# Patient Record
Sex: Male | Born: 1940 | Race: White | Hispanic: No | Marital: Married | Smoking: Never smoker
Health system: Southern US, Community
[De-identification: ages and names within clinical notes are randomized; demographics above are authoritative.]

## PROBLEM LIST (undated history)

## (undated) ENCOUNTER — Emergency Department (HOSPITAL_COMMUNITY): Payer: 59

## (undated) DIAGNOSIS — C801 Malignant (primary) neoplasm, unspecified: Secondary | ICD-10-CM

## (undated) DIAGNOSIS — D689 Coagulation defect, unspecified: Secondary | ICD-10-CM

## (undated) DIAGNOSIS — H269 Unspecified cataract: Secondary | ICD-10-CM

## (undated) DIAGNOSIS — Z8619 Personal history of other infectious and parasitic diseases: Secondary | ICD-10-CM

## (undated) DIAGNOSIS — I10 Essential (primary) hypertension: Secondary | ICD-10-CM

## (undated) DIAGNOSIS — I4892 Unspecified atrial flutter: Secondary | ICD-10-CM

## (undated) DIAGNOSIS — Z953 Presence of xenogenic heart valve: Secondary | ICD-10-CM

## (undated) DIAGNOSIS — I499 Cardiac arrhythmia, unspecified: Secondary | ICD-10-CM

## (undated) DIAGNOSIS — I639 Cerebral infarction, unspecified: Secondary | ICD-10-CM

## (undated) DIAGNOSIS — Z5189 Encounter for other specified aftercare: Secondary | ICD-10-CM

## (undated) DIAGNOSIS — G709 Myoneural disorder, unspecified: Secondary | ICD-10-CM

## (undated) DIAGNOSIS — I4891 Unspecified atrial fibrillation: Secondary | ICD-10-CM

## (undated) DIAGNOSIS — N200 Calculus of kidney: Secondary | ICD-10-CM

## (undated) DIAGNOSIS — Z8673 Personal history of transient ischemic attack (TIA), and cerebral infarction without residual deficits: Secondary | ICD-10-CM

## (undated) DIAGNOSIS — E039 Hypothyroidism, unspecified: Secondary | ICD-10-CM

## (undated) DIAGNOSIS — Z87442 Personal history of urinary calculi: Secondary | ICD-10-CM

## (undated) DIAGNOSIS — M1990 Unspecified osteoarthritis, unspecified site: Secondary | ICD-10-CM

## (undated) DIAGNOSIS — J302 Other seasonal allergic rhinitis: Secondary | ICD-10-CM

## (undated) DIAGNOSIS — K219 Gastro-esophageal reflux disease without esophagitis: Secondary | ICD-10-CM

## (undated) DIAGNOSIS — Z85828 Personal history of other malignant neoplasm of skin: Secondary | ICD-10-CM

## (undated) HISTORY — PX: CARDIAC VALVE REPLACEMENT: SHX585

## (undated) HISTORY — PX: BILATERAL CARPAL TUNNEL RELEASE: SHX6508

## (undated) HISTORY — PX: BACK SURGERY: SHX140

## (undated) HISTORY — DX: Other seasonal allergic rhinitis: J30.2

## (undated) HISTORY — DX: Unspecified atrial flutter: I48.92

## (undated) HISTORY — DX: Personal history of other malignant neoplasm of skin: Z85.828

## (undated) HISTORY — DX: Personal history of other infectious and parasitic diseases: Z86.19

## (undated) HISTORY — PX: SPINE SURGERY: SHX786

## (undated) HISTORY — DX: Presence of xenogenic heart valve: Z95.3

## (undated) HISTORY — DX: Coagulation defect, unspecified: D68.9

## (undated) HISTORY — DX: Myoneural disorder, unspecified: G70.9

## (undated) HISTORY — DX: Encounter for other specified aftercare: Z51.89

## (undated) HISTORY — PX: JOINT REPLACEMENT: SHX530

## (undated) HISTORY — DX: Personal history of transient ischemic attack (TIA), and cerebral infarction without residual deficits: Z86.73

## (undated) HISTORY — DX: Calculus of kidney: N20.0

## (undated) HISTORY — DX: Unspecified cataract: H26.9

## (undated) HISTORY — PX: EPIGASTRIC HERNIA REPAIR: SUR1177

## (undated) HISTORY — DX: Unspecified atrial fibrillation: I48.91

## (undated) HISTORY — PX: HERNIA REPAIR: SHX51

## (undated) HISTORY — PX: COLONOSCOPY: SHX174

## (undated) HISTORY — PX: CATARACT EXTRACTION, BILATERAL: SHX1313

---

## 2007-07-13 HISTORY — PX: AORTIC VALVE REPLACEMENT: SHX41

## 2009-07-12 DIAGNOSIS — Z8673 Personal history of transient ischemic attack (TIA), and cerebral infarction without residual deficits: Secondary | ICD-10-CM

## 2009-07-12 HISTORY — DX: Personal history of transient ischemic attack (TIA), and cerebral infarction without residual deficits: Z86.73

## 2013-02-22 DIAGNOSIS — I10 Essential (primary) hypertension: Secondary | ICD-10-CM | POA: Insufficient documentation

## 2014-01-18 DIAGNOSIS — Z952 Presence of prosthetic heart valve: Secondary | ICD-10-CM | POA: Insufficient documentation

## 2015-09-29 ENCOUNTER — Emergency Department (INDEPENDENT_AMBULATORY_CARE_PROVIDER_SITE_OTHER)
Admission: EM | Admit: 2015-09-29 | Discharge: 2015-09-29 | Disposition: A | Discharge status: Home / Self Care | Payer: 59 | Source: Home / Self Care | Attending: Family Medicine | Admitting: Family Medicine

## 2015-09-29 ENCOUNTER — Encounter (HOSPITAL_COMMUNITY): Payer: Self-pay | Admitting: Emergency Medicine

## 2015-09-29 DIAGNOSIS — S90221A Contusion of right lesser toe(s) with damage to nail, initial encounter: Secondary | ICD-10-CM | POA: Diagnosis not present

## 2015-09-29 MED ORDER — CEPHALEXIN 500 MG PO CAPS: 500.0000 mg | ORAL_CAPSULE | Freq: Three times a day (TID) | ORAL | Status: DC

## 2015-09-29 NOTE — ED Provider Notes (Signed)
CSN: [id number]     Arrival date & time 09/29/15  1742 History   First MD Initiated Contact with Patient 09/29/15 1944     Chief Complaint  Patient presents with  . Nail Problem   (Consider location/radiation/quality/duration/timing/severity/associated sxs/prior Treatment) HPI History obtained from patient:   LOCATION:right great toe SEVERITY:2 DURATION:a couple of days CONTEXT:unsure of injury  QUALITY: MODIFYING FACTORS:soaking in warm water ASSOCIATED SYMPTOMS:some redness around base of nail TIMING:constant OCCUPATION:  Past Medical History  Diagnosis Date  . Aortic valve replaced    Past Surgical History  Procedure Laterality Date  . Back surgery     History reviewed. No pertinent family history. Social History  Substance Use Topics  . Smoking status: Never Smoker   . Smokeless tobacco: None  . Alcohol Use: No    Review of Systems Injury to toe Allergies  Review of patient's allergies indicates no known allergies.  Home Medications   Prior to Admission medications   Medication Sig Start Date End Date Taking? Authorizing Provider  dabigatran (PRADAXA) 150 MG CAPS capsule Take 150 mg by mouth 2 (two) times daily.   Yes Historical Provider, MD  Diclofenac Epolamine (FLECTOR TD) Place onto the skin.   Yes Historical Provider, MD  furosemide (LASIX) 20 MG tablet Take 20 mg by mouth daily as needed.   Yes Historical Provider, MD  Glucos-MSM-C-Mn-Ginger-Willow (GLUCOSAMINE MSM COMPLEX) TABS Take by mouth.   Yes Historical Provider, MD  levothyroxine (SYNTHROID) 50 MCG tablet Take 50 mcg by mouth daily before breakfast.   Yes Historical Provider, MD  potassium chloride (KLOR-CON) 20 MEQ packet Take by mouth 2 (two) times daily.   Yes Historical Provider, MD  tamsulosin (FLOMAX) 0.4 MG CAPS capsule Take 0.4 mg by mouth daily.   Yes Historical Provider, MD  cephALEXin (KEFLEX) 500 MG capsule Take 1 capsule (500 mg total) by mouth 3 (three) times daily. 09/29/15   Konrad Felix, PA   Meds Ordered and Administered this Visit  Medications - No data to display  BP 155/93 mmHg  Pulse 84  Temp(Src) 98.2 F (36.8 C) (Oral)  Resp 16  SpO2 97% No data found.   Physical Exam  Constitutional: He is oriented to person, place, and time. He appears well-developed and well-nourished. No distress.  HENT:  Head: Normocephalic and atraumatic.  Musculoskeletal:  Great toe right foot, subungual hematoma noted No infection, Nail remains attached to nail bed  Neurological: He is alert and oriented to person, place, and time.  Skin: Skin is warm and dry.  Psychiatric: He has a normal mood and affect. His behavior is normal.    ED Course  Procedures (including critical care time)  Labs Review Labs Reviewed - No data to display  Imaging Review No results found.   Visual Acuity Review  Right Eye Distance:   Left Eye Distance:   Bilateral Distance:    Right Eye Near:   Left Eye Near:    Bilateral Near:        Nail trephination with heat cautery and improvement of symptoms and pressure  RX for keflex is provided.  MDM   1. Subungual hematoma of foot, right, initial encounter     Patient is reassured that there are no issues that require transfer to higher level of care at this time or additional tests. Patient is advised to continue home symptomatic treatment. Patient is advised that if there are new or worsening symptoms to attend the emergency department, contact primary care provider,  or return to UC. Instructions of care provided discharged home in stable condition. Return to work/school note provided.   THIS NOTE WAS GENERATED USING A VOICE RECOGNITION SOFTWARE PROGRAM. ALL REASONABLE EFFORTS  WERE MADE TO PROOFREAD THIS DOCUMENT FOR ACCURACY.  I have verbally reviewed the discharge instructions with the patient. A printed AVS was given to the patient.  All questions were answered prior to discharge.      Konrad Felix,  [place] 09/29/15 2043

## 2015-09-29 NOTE — ED Notes (Signed)
Patient c/o right great toe discomfort and discoloration x 3 days. Has tried warm soaks. Patient is in NAD.

## 2015-09-29 NOTE — Discharge Instructions (Signed)
Subungual Hematoma  A subungual hematoma is a pocket of blood under the fingernail or toenail. The nail may turn blue or feel painful. HOME CARE  Put ice on the injured area.  Put ice in a plastic bag.  Place a towel between your skin and the bag.  Leave the ice on for 15-20 minutes, 03-04 times a day. Do this for the first 1 to 2 days.  Raise (elevate) the injured area to lessen pain and puffiness (swelling).  If you were given a bandage, wear it for as long as told by your doctor.  If part of your nail falls off, trim the rest of the nail gently.  Only take medicines as told by your doctor. GET HELP RIGHT AWAY IF:  You have redness or puffiness around the nail.  You have yellowish-white fluid (pus) coming from the nail.  Your pain does not get better with medicine.  You have a fever. MAKE SURE YOU:  Understand these instructions.  Will watch your condition.  Will get help right away if you are not doing well or get worse.   This information is not intended to replace advice given to you by your health care provider. Make sure you discuss any questions you have with your health care provider.   Document Released: 09/20/2011 Document Reviewed: 11/13/2014 Elsevier Interactive Patient Education [employer].

## 2015-10-01 MED FILL — CEPHALEXIN 500 MG CAPSULE: 500 | 7 days supply | Qty: 21 | Fill #0

## 2016-09-07 DIAGNOSIS — I480 Paroxysmal atrial fibrillation: Secondary | ICD-10-CM | POA: Insufficient documentation

## 2016-09-08 DIAGNOSIS — I480 Paroxysmal atrial fibrillation: Secondary | ICD-10-CM | POA: Diagnosis not present

## 2016-09-08 DIAGNOSIS — I4892 Unspecified atrial flutter: Secondary | ICD-10-CM | POA: Diagnosis not present

## 2016-09-08 DIAGNOSIS — I10 Essential (primary) hypertension: Secondary | ICD-10-CM | POA: Diagnosis not present

## 2016-09-08 DIAGNOSIS — Z952 Presence of prosthetic heart valve: Secondary | ICD-10-CM | POA: Diagnosis not present

## 2016-12-08 ENCOUNTER — Ambulatory Visit (INDEPENDENT_AMBULATORY_CARE_PROVIDER_SITE_OTHER): Payer: 59

## 2016-12-08 ENCOUNTER — Ambulatory Visit
Admission: EM | Admit: 2016-12-08 | Discharge: 2016-12-08 | Disposition: A | Discharge status: Home / Self Care | Payer: 59 | Attending: Emergency Medicine | Admitting: Emergency Medicine

## 2016-12-08 ENCOUNTER — Encounter: Payer: Self-pay | Admitting: Emergency Medicine

## 2016-12-08 DIAGNOSIS — J019 Acute sinusitis, unspecified: Secondary | ICD-10-CM | POA: Diagnosis not present

## 2016-12-08 DIAGNOSIS — R05 Cough: Secondary | ICD-10-CM | POA: Diagnosis not present

## 2016-12-08 DIAGNOSIS — R059 Cough, unspecified: Secondary | ICD-10-CM

## 2016-12-08 MED ORDER — DOXYCYCLINE HYCLATE 100 MG PO CAPS: 100.0000 mg | ORAL_CAPSULE | Freq: Two times a day (BID) | ORAL | 0 refills | Status: DC

## 2016-12-08 MED ORDER — ALBUTEROL SULFATE HFA 108 (90 BASE) MCG/ACT IN AERS: 2.0000 | INHALATION_SPRAY | RESPIRATORY_TRACT | 0 refills | Status: DC | PRN

## 2016-12-08 MED ORDER — BENZONATATE 100 MG PO CAPS: 100.0000 mg | ORAL_CAPSULE | Freq: Three times a day (TID) | ORAL | 0 refills | Status: DC | PRN

## 2016-12-08 NOTE — ED Triage Notes (Signed)
Patient c/o nasal congestion, cough, and chest congestion for 2-3 weeks.  Patient reports fever 3 days ago.

## 2016-12-08 NOTE — Discharge Instructions (Signed)
Take medication as prescribed. Rest. Drink plenty of fluids.  ° °Follow up with your primary care physician this week as needed. Return to Urgent care for new or worsening concerns.  ° °

## 2016-12-08 NOTE — ED Provider Notes (Signed)
[facility] ____________________________________________  Time seen: Approximately 9:45 AM  I have reviewed the triage vital signs and the nursing notes.   HISTORY  Chief Complaint Cough and Nasal Congestion  HPI Keith [name] is a 76 y.o. male present for evaluation of to 3 weeks of runny nose, nasal congestion and cough. Patient reports that he felt like he may have been having some seasonal allergies or picked up a cold but reports symptoms have continued. Reports occasionally symptoms slightly improved and then may return. Reports a few nights ago he did have a sensation of having a fever, not measured. Denies any fever since. Reports has taken some over-the-counter congestion medications with slight improvement, no resolution. Reports cough is productive of greenish mucus. Reports was getting a lot of greenish mucus out when blowing nose and postnasal drainage but states no longer as productive and nasal area. Denies pain. States some sore throat from coughing. Denies ear complaints. Reports occasional wheezing at night with coughing. Denies other wheezing. Denies shortness of breath. Reports continues to eat and drink well. Reports has continued to remain active. Denies known sick contacts, but reports he does travel a lot and frequently around other people.  Denies chest pain, shortness of breath, abdominal pain, dysuria, extremity pain, extremity swelling or rash. Denies recent sickness. Denies recent antibiotic use. Denies renal insufficiency.  PCP: in [place]   Past Medical History:  Diagnosis Date  . Aortic valve replaced   BPH  There are no active problems to display for this patient.   Past Surgical History:  Procedure Laterality Date  . BACK SURGERY       No current facility-administered medications for this encounter.   Current Outpatient Prescriptions:  .  albuterol (PROVENTIL HFA;VENTOLIN HFA) 108 (90 Base) MCG/ACT inhaler, Inhale 2 puffs into the  lungs every 4 (four) hours as needed for wheezing., Disp: 1 Inhaler, Rfl: 0 .  benzonatate (TESSALON PERLES) 100 MG capsule, Take 1 capsule (100 mg total) by mouth 3 (three) times daily as needed for cough., Disp: 15 capsule, Rfl: 0 .  dabigatran (PRADAXA) 150 MG CAPS capsule, Take 150 mg by mouth 2 (two) times daily., Disp: , Rfl:  .  Diclofenac Epolamine (FLECTOR TD), Place onto the skin., Disp: , Rfl:  .  doxycycline (VIBRAMYCIN) 100 MG capsule, Take 1 capsule (100 mg total) by mouth 2 (two) times daily., Disp: 20 capsule, Rfl: 0 .  furosemide (LASIX) 20 MG tablet, Take 20 mg by mouth daily as needed., Disp: , Rfl:  .  Glucos-MSM-C-Mn-Ginger-Willow (GLUCOSAMINE MSM COMPLEX) TABS, Take by mouth., Disp: , Rfl:  .  levothyroxine (SYNTHROID) 50 MCG tablet, Take 50 mcg by mouth daily before breakfast., Disp: , Rfl:  .  potassium chloride (KLOR-CON) 20 MEQ packet, Take by mouth 2 (two) times daily., Disp: , Rfl:  .  tamsulosin (FLOMAX) 0.4 MG CAPS capsule, Take 0.4 mg by mouth daily., Disp: , Rfl:   Allergies Patient has no known allergies.  History reviewed. No pertinent family history.  Social History Social History  Substance Use Topics  . Smoking status: Never Smoker  . Smokeless tobacco: Never Used  . Alcohol use No    Review of Systems Constitutional: No fever/chills ENT: As above. Cardiovascular: Denies chest pain. Respiratory: Denies shortness of breath. Gastrointestinal: No abdominal pain.  No nausea, no vomiting.  No diarrhea.  No constipation. Genitourinary: Negative for dysuria. Musculoskeletal: Negative for back pain. Skin: Negative for rash.  ____________________________________________   PHYSICAL EXAM:  VITAL SIGNS:  ED Triage Vitals  Enc Vitals Group     BP 12/08/16 0819 135/82     Pulse Rate 12/08/16 0819 73     Resp 12/08/16 0819 16     Temp 12/08/16 0819 97.9 F (36.6 C)     Temp Source 12/08/16 0819 Oral     SpO2 12/08/16 0819 97 %     Weight 12/08/16  0816 190 lb (86.2 kg)     Height 12/08/16 0816 5\' 11"  (1.803 m)     Head Circumference --      Peak Flow --      Pain Score 12/08/16 0817 0     Pain Loc --      Pain Edu? --      Excl. in [place]? --     Constitutional: Alert and oriented. Well appearing and in no acute distress. Eyes: Conjunctivae are normal. PERRL. EOMI. Head: Atraumatic.Mild  tenderness to palpation bilateral maxillary sinusesNo frontal sinus tenderness to palpation, . No swelling. No erythema.   Ears: no erythema, normal TMs bilaterally.   Nose:  mild nasal congestion with bilateral nasal turbinate erythema and edema.   Mouth/Throat: Mucous membranes are moist.  Oropharynx non-erythematous.No tonsillar swelling or exudate.  Neck: No stridor.  No cervical spine tenderness to palpation. Hematological/Lymphatic/Immunilogical: No cervical lymphadenopathy. Cardiovascular: Normal rate, regular rhythm. Grossly normal heart sounds.  Good peripheral circulation. Respiratory: Normal respiratory effort.  No retractions.  no wheezes. Mild scattered rhonchi.Good air movement.Occasional dry cough noted in room.   Gastrointestinal: Soft and nontender.  Musculoskeletal: No lower extremity tenderness nor edema No cervical, thoracic or lumbar tenderness to palpation.  Neurologic:  Normal speech and language.  No gait instability. Skin:  Skin is warm, dry Psychiatric: Mood and affect are normal. Speech and behavior are normal.  ___________________________________________   LABS (all labs ordered are listed, but only abnormal results are displayed)  Labs Reviewed - No data to display ____________________________________________  RADIOLOGY  Dg Chest 2 View  Result Date: 12/08/2016 CLINICAL DATA:  Chest congestion and productive cough. EXAM: CHEST  2 VIEW COMPARISON:  None. FINDINGS: Sternotomy wires overlie normal cardiac silhouette. No effusion, infiltrate or pneumothorax. Mild thickening along the RIGHT lateral pleural space.  Degenerative osteophytosis of the spine. Sclerosis in the LEFT humeral head is likely degenerative. IMPRESSION: No acute cardiopulmonary process. Electronically Signed   By: Suzy Bouchard M.D.   On: 12/08/2016 09:02   ____________________________________________   PROCEDURES Procedures   INITIAL IMPRESSION / ASSESSMENT AND PLAN / ED COURSE  Pertinent labs & imaging results that were available during my care of the patient were reviewed by me and considered in my medical decision making (see chart for details).  Well-appearing patient. No acute distress. Suspect sinusitis and bronchitis, discussed with patient we'll evaluate chest x-ray. Chest x-ray per radiologist no acute cardiopulmonary process. Will treat patient with doxycycline, when necessary Tessalon Perles and when necessary albuterol inhaler. Encouraged rest, fluids and supportive care. Discussed indication, risks and benefits of medications with patient.  Discussed follow up with Primary care physician this week. Discussed follow up and return parameters including no resolution or any worsening concerns. Patient verbalized understanding and agreed to plan.   ____________________________________________   FINAL CLINICAL IMPRESSION(S) / ED DIAGNOSES  Final diagnoses:  Acute sinusitis with symptoms greater than 10 days  Cough     Discharge Medication List as of 12/08/2016  9:15 AM    START taking these medications   Details  albuterol (PROVENTIL HFA;VENTOLIN HFA) 108 (90  Base) MCG/ACT inhaler Inhale 2 puffs into the lungs every 4 (four) hours as needed for wheezing., Starting Wed 12/08/2016, Normal    benzonatate (TESSALON PERLES) 100 MG capsule Take 1 capsule (100 mg total) by mouth 3 (three) times daily as needed for cough., Starting Wed 12/08/2016, Normal    doxycycline (VIBRAMYCIN) 100 MG capsule Take 1 capsule (100 mg total) by mouth 2 (two) times daily., Starting Wed 12/08/2016, Normal        Note: This dictation  was prepared with Dragon dictation along with smaller phrase technology. Any transcriptional errors that result from this process are unintentional.         Marylene Land, NP 12/08/16 [phone]

## 2016-12-21 DIAGNOSIS — M5416 Radiculopathy, lumbar region: Secondary | ICD-10-CM | POA: Diagnosis not present

## 2016-12-21 MED FILL — predniSONE 10 MG (21) TBPK: 10 | 6 days supply | Qty: 21 | Fill #0

## 2017-01-03 DIAGNOSIS — M5416 Radiculopathy, lumbar region: Secondary | ICD-10-CM | POA: Diagnosis not present

## 2017-01-07 DIAGNOSIS — M5416 Radiculopathy, lumbar region: Secondary | ICD-10-CM | POA: Diagnosis not present

## 2017-01-11 DIAGNOSIS — M5416 Radiculopathy, lumbar region: Secondary | ICD-10-CM | POA: Diagnosis not present

## 2017-01-21 DIAGNOSIS — M5416 Radiculopathy, lumbar region: Secondary | ICD-10-CM | POA: Diagnosis not present

## 2017-02-05 DIAGNOSIS — M5416 Radiculopathy, lumbar region: Secondary | ICD-10-CM | POA: Diagnosis not present

## 2017-02-15 DIAGNOSIS — M961 Postlaminectomy syndrome, not elsewhere classified: Secondary | ICD-10-CM | POA: Diagnosis not present

## 2017-02-15 DIAGNOSIS — M5136 Other intervertebral disc degeneration, lumbar region: Secondary | ICD-10-CM | POA: Diagnosis not present

## 2017-02-15 DIAGNOSIS — M5416 Radiculopathy, lumbar region: Secondary | ICD-10-CM | POA: Diagnosis not present

## 2017-03-16 DIAGNOSIS — M5416 Radiculopathy, lumbar region: Secondary | ICD-10-CM | POA: Diagnosis not present

## 2017-03-16 DIAGNOSIS — M961 Postlaminectomy syndrome, not elsewhere classified: Secondary | ICD-10-CM | POA: Diagnosis not present

## 2017-04-04 DIAGNOSIS — M5416 Radiculopathy, lumbar region: Secondary | ICD-10-CM | POA: Diagnosis not present

## 2017-04-04 DIAGNOSIS — M5136 Other intervertebral disc degeneration, lumbar region: Secondary | ICD-10-CM | POA: Diagnosis not present

## 2017-04-04 DIAGNOSIS — M961 Postlaminectomy syndrome, not elsewhere classified: Secondary | ICD-10-CM | POA: Diagnosis not present

## 2017-04-19 DIAGNOSIS — M961 Postlaminectomy syndrome, not elsewhere classified: Secondary | ICD-10-CM | POA: Diagnosis not present

## 2017-05-09 ENCOUNTER — Other Ambulatory Visit: Payer: Self-pay | Admitting: Neurological Surgery

## 2017-05-09 DIAGNOSIS — M5416 Radiculopathy, lumbar region: Secondary | ICD-10-CM | POA: Diagnosis not present

## 2017-05-23 NOTE — Progress Notes (Signed)
Called Dr. Ronnald Ramp office to inquire if they have any info from patients cardio in [place] (left VM for Erin [phone])

## 2017-05-23 NOTE — Pre-Procedure Instructions (Signed)
Keith [name]  05/23/2017      [facility] - [place], [place] [facility] [address] AT [address] [place] [address] Phone: [phone] Fax: [phone]  [place], [place] - [address] [place] [address] Phone: [phone] Fax: [phone]    Your procedure is scheduled on Wednesday, November 21st   Report to [facility] Admitting at 8:30 AM             (posted surgery time 10:30a - 12noon)   Call this number if you have problems the MORNING  of surgery:  [phone].   Remember:              4-5 days prior to surgery, STOP TAKING any Vitamins, Herbal Supplements, Anti-inflammatories, Blood thinners.              Pradaxa will be stopped on___________________________   Do not eat food or drink liquids after midnight Tuesday.   Take these medicines the morning of surgery with A SIP OF WATER : Levothyroxine, Tamsulosin.    Do not wear jewelry, no rings or watches.  Do not wear lotions, colognes or deoderant.             Men may shave face and neck.   Do not bring valuables to the hospital.  [facility] is not responsible for any belongings or valuables.  Contacts, dentures or bridgework may not be worn into surgery.  Leave your suitcase in the car.  After surgery it may be brought to your room. For patients admitted to the hospital, discharge time will be determined by your treatment team.   Please read over the following fact sheets that you were given. Pain Booklet, MRSA Information and Surgical Site Infection Prevention       [place]- Preparing For Surgery  Before surgery, you can play an important role. Because skin is not sterile, your skin needs to be as free of germs as possible. You can reduce the number of germs on your skin by washing with CHG (chlorahexidine gluconate) Soap before surgery.  CHG is an antiseptic cleaner which  kills germs and bonds with the skin to continue killing germs even after washing.  Please do not use if you have an allergy to CHG or antibacterial soaps. If your skin becomes reddened/irritated stop using the CHG.  Do not shave (including legs and underarms) for at least 48 hours prior to first CHG shower. It is OK to shave your face.  Please follow these instructions carefully.   1. Shower the NIGHT BEFORE SURGERY and the MORNING OF SURGERY with CHG.   2. If you chose to wash your hair, wash your hair first as usual with your normal shampoo.  3. After you shampoo, rinse your hair and body thoroughly to remove the shampoo.  4. Use CHG as you would any other liquid soap. You can apply CHG directly to the skin and wash gently with a scrungie or a clean washcloth.   5. Apply the CHG Soap to your body ONLY FROM THE NECK DOWN.  Do not use on open wounds or open sores. Avoid contact with your eyes, ears, mouth and genitals (private parts). Wash Face and genitals (private parts)  with your normal soap.  6. Wash thoroughly, paying special attention to the area where your surgery will be performed.  7. Thoroughly rinse  your body with warm water from the neck down.  8. DO NOT shower/wash with your normal soap after using and rinsing off the CHG Soap.  9. Pat yourself dry with a CLEAN TOWEL.  10. Wear CLEAN PAJAMAS to bed the night before surgery, wear comfortable clothes the morning of surgery  11. Place CLEAN SHEETS on your bed the night of your first shower and DO NOT SLEEP WITH PETS.    Day of Surgery: Do not apply any deodorants/lotions. Please wear clean clothes to the hospital/surgery center.

## 2017-05-24 ENCOUNTER — Other Ambulatory Visit: Payer: Self-pay

## 2017-05-24 ENCOUNTER — Encounter (HOSPITAL_COMMUNITY)
Admission: RE | Admit: 2017-05-24 | Discharge: 2017-05-24 | Disposition: A | Discharge status: Home / Self Care | Payer: 59 | Source: Ambulatory Visit | Attending: Neurological Surgery | Admitting: Neurological Surgery

## 2017-05-24 ENCOUNTER — Encounter (HOSPITAL_COMMUNITY): Payer: Self-pay

## 2017-05-24 DIAGNOSIS — E039 Hypothyroidism, unspecified: Secondary | ICD-10-CM | POA: Insufficient documentation

## 2017-05-24 DIAGNOSIS — I481 Persistent atrial fibrillation: Secondary | ICD-10-CM | POA: Diagnosis not present

## 2017-05-24 DIAGNOSIS — Z01812 Encounter for preprocedural laboratory examination: Secondary | ICD-10-CM | POA: Diagnosis not present

## 2017-05-24 DIAGNOSIS — K219 Gastro-esophageal reflux disease without esophagitis: Secondary | ICD-10-CM | POA: Diagnosis not present

## 2017-05-24 DIAGNOSIS — M1990 Unspecified osteoarthritis, unspecified site: Secondary | ICD-10-CM | POA: Diagnosis not present

## 2017-05-24 DIAGNOSIS — Z01810 Encounter for preprocedural cardiovascular examination: Secondary | ICD-10-CM | POA: Insufficient documentation

## 2017-05-24 HISTORY — DX: Gastro-esophageal reflux disease without esophagitis: K21.9

## 2017-05-24 HISTORY — DX: Personal history of urinary calculi: Z87.442

## 2017-05-24 HISTORY — DX: Cerebral infarction, unspecified: I63.9

## 2017-05-24 HISTORY — DX: Malignant (primary) neoplasm, unspecified: C80.1

## 2017-05-24 HISTORY — DX: Cardiac arrhythmia, unspecified: I49.9

## 2017-05-24 HISTORY — DX: Unspecified osteoarthritis, unspecified site: M19.90

## 2017-05-24 LAB — CBC WITH DIFFERENTIAL/PLATELET
BASOS ABS: 0 10*3/uL (ref 0.0–0.1)
BASOS PCT: 0 %
Eosinophils Absolute: 0.3 10*3/uL (ref 0.0–0.7)
Eosinophils Relative: 4 %
HEMATOCRIT: 43.2 % (ref 39.0–52.0)
Hemoglobin: 14.6 g/dL (ref 13.0–17.0)
Lymphocytes Relative: 19 %
Lymphs Abs: 1.2 10*3/uL (ref 0.7–4.0)
MCH: 30.9 pg (ref 26.0–34.0)
MCHC: 33.8 g/dL (ref 30.0–36.0)
MCV: 91.5 fL (ref 78.0–100.0)
Monocytes Absolute: 0.6 10*3/uL (ref 0.1–1.0)
Monocytes Relative: 9 %
NEUTROS ABS: 4.5 10*3/uL (ref 1.7–7.7)
NEUTROS PCT: 68 %
Platelets: 166 10*3/uL (ref 150–400)
RBC: 4.72 MIL/uL (ref 4.22–5.81)
RDW: 12.9 % (ref 11.5–15.5)
WBC: 6.6 10*3/uL (ref 4.0–10.5)

## 2017-05-24 LAB — BASIC METABOLIC PANEL
ANION GAP: 6 (ref 5–15)
BUN: 23 mg/dL — ABNORMAL HIGH (ref 6–20)
CO2: 24 mmol/L (ref 22–32)
Calcium: 8.6 mg/dL — ABNORMAL LOW (ref 8.9–10.3)
Chloride: 108 mmol/L (ref 101–111)
Creatinine, Ser: 1.09 mg/dL (ref 0.61–1.24)
GLUCOSE: 97 mg/dL (ref 65–99)
Potassium: 4 mmol/L (ref 3.5–5.1)
Sodium: 138 mmol/L (ref 135–145)

## 2017-05-24 LAB — SURGICAL PCR SCREEN
MRSA, PCR: NEGATIVE
STAPHYLOCOCCUS AUREUS: NEGATIVE

## 2017-05-24 NOTE — Progress Notes (Signed)
PATIENT STATED HE WILL TAKE LAST DOSE OF PRADAXA 05/26/17.  WILL NEED PT/ INR DOS.  REQUESTED FROM DR. [place] TEST, CARDIAC CATH, EKG, LAST OV.  [phone] OR [phone]

## 2017-05-25 NOTE — Progress Notes (Addendum)
Anesthesia Chart Review: Patient is a 76 year old male scheduled for extraforaminal microdiscectomy L5-S1, right on 06/01/2017 by Dr. Sherley Bounds.  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  Patient does not have a PCP. His cardiologist is Dr. Cline Cools with [facility] (phone [phone], fax [phone]; see Care Everywhere). Last visit 09/07/16. Of note, patient has known PAF. Back on 06/23/15 Dr. Lorne Skeens discussed afib management, rate versus rhythm control and decision was made for rate control; however, shortly thereafter patient developed symptoms from his afib and underwent DCCV 06/26/15. Patient says symptoms were present when his HR ~ 90-100, but otherwise patient is not symptomatic with his usual HR ~ 60's and denied any current symptoms such as SOB, fatigue, chest pain, edema, palpitations. 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 He has follow-up with Dr. Lorne Skeens scheduled for next year.)  (Update 05/30/17 9:33 AM: Received cardiac clearance letter from signed by Dr. Lorne Skeens. He gave permission to hold Pradaxa 3 days prior to surgery, although patient has actually been holding since 05/26/17.)  BP (!) 152/84   Pulse 71   Temp 36.5 C   Resp 20   Ht 5\' 11"  (1.803 m)   Wt 191 lb 8 oz (86.9 kg)   SpO2 97%   BMI 26.71 kg/m   Meds include Pradaxa (last dose 05/26/17), flecainide, Lasix, levothyroxine, KCl, Flomax. (Atenolol discontinued in the past due to fatigue and bradycardia.)  EKG 05/24/17 10:42:52: Afib at 66 bpm, non-specific T wave abnormality.   EKG 05/24/17 10:51:38: Afib at 66 bpm, non-specific T wave abnormality. (According to Dr. Randye Lobo 09/07/16 note, EKG 06/23/15 showed rate controlled afib. Patient  underwent DCCV 06/26/15. 06/30/15, 01/07/16, 09/07/16 showed SR/SB with first degree AV block.)   TEE (with DCCV) 06/26/2015 ([facility]; Care Everywhere): 1. No intracardiac thrombus visualized. 2. Moderate left atrial enlargement. 3. Normal left ventricular size and normal systolic function. Estimated EF 65%. 4. Trivial mitral insufficiency, trivial tricuspid insufficiency, and normal functioning bioprosthetic aortic valve. Peak velocity through the aortic valve is 1.30m/sec. 5. Normal size aortic root. 6. Bubble study showing a small patent foramen ovale (small right to left atrial shunt).  Latest cardiac cath requested from Dr. Randye Lobo office. Cath report received was from 01/18/08 and showed: Normal-appearing coronary arteries with no evidence of any significant atherosclerotic coronary disease by angiography. 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 Normal left ventricular systolic function. (Patinet subsequently underwent AVR 02/27/08.)  CXR 12/08/16: IMPRESSION: No acute cardiopulmonary process.  Preoperative labs noted. Cr 1.09, glucose 97, CBC WNL. PT/INR scheduled for the day of surgery.   Patient with known PAF with recurrent afib on PAT EKG. He is rate controlled. Patient notified of recurrent afib, and he denied symptoms. His last dose of Pradaxa is scheduled for today. Normal coronaries in 2009 prior to AVR. I discussed above with anesthesiologist Dr. Annye Asa. If patient remains rate controlled and without CV/CHF symptoms or other acute changes then it is anticipated that he can proceed as planned. I did notify patient that if he develops fast heart rate, new chest pain, SOB, fatigue, edema that he would need to be be re-evaluated by cardiology prior to surgery, but if no new changes before surgery then would plan to proceed. I have updated Lorriane Shire at Dr. Ronnald Ramp' office. Anesthesiologist  to evaluate on the day of surgery.     George Hugh [facility] Short Stay  Center/Anesthesiology Phone [phone] 05/26/2017 11:53 AM

## 2017-05-31 ENCOUNTER — Other Ambulatory Visit: Payer: Self-pay | Admitting: *Deleted

## 2017-05-31 NOTE — Patient Outreach (Signed)
Keith [name] [facility]) Care Management  05/31/2017  Keith [name] Sep 05, 1940 [id number]   Subjective: Telephone call to patient's home / mobile number, spoke with patient, and HIPAA verified.  Discussed [facility] Care Management [facility]MR Transition of care follow up, preoperative call follow up, patient voiced understanding, and is in agreement to both types of follow up.   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 States she is accessing the following [facility] benefits: outpatient pharmacy, hospital indemnity (will ask his wife who is the [employer] employee to follow up, file claim if appropriate), and will call [employer] today to start family medical leave act (FMLA) process.  Patient states he does not have any preoperative questions, care coordination, disease management, disease monitoring, transportation, community resource, or pharmacy needs at this time.   States he is very appreciative of the follow up and is in agreement to receive [place] Management information post transition of care follow up.      Objective: Per KPN (Knowledge Performance Now, point of care tool) and chart review, patient to be admitted on 06/01/17 to [facility] for EXTRAFORAMINAL MICRODISCECTOMY LUMBAR 5- SACRAL 1, RIGHT.    Patient has a history of BPH.       Assessment: Received UMR Transition of care referral on 05/25/17.   Preoperative call completed, and transition of care follow up pending notification of patient discharge.     Plan: RNCM will call patient for  telephone outreach attempt, transition of care follow up, within 3 business days of hospital discharge notification.    Keith [name] H. Annia Friendly, BSN, [place] Management [facility]  Telephonic CM Phone: [phone] Fax: [phone]

## 2017-05-31 NOTE — Anesthesia Preprocedure Evaluation (Addendum)
Anesthesia Evaluation  Patient identified by MRN, date of birth, ID band Patient awake    Reviewed: Allergy & Precautions, NPO status , Patient's Chart, lab work & pertinent test results  Airway Mallampati: I  TM Distance: >3 FB Neck ROM: Full    Dental  (+) Dental Advisory Given, Teeth Intact   Pulmonary neg pulmonary ROS,    Pulmonary exam normal breath sounds clear to auscultation       Cardiovascular Exercise Tolerance: Good Normal cardiovascular exam+ dysrhythmias Atrial Fibrillation  Rhythm:Irregular Rate:Normal  S/p AVR  TEE 2016 -  Moderate left atrial enlargement. Normal left ventricular size and normal systolic function. Trivial mitral insufficiency, trivial tricuspid insufficiency, and normal functioning bioprosthetic aortic valve. Normal size aortic root. Small PFO.   Neuro/Psych CVA, Residual Symptoms negative psych ROS   GI/Hepatic Neg liver ROS, GERD  Controlled and Medicated,  Endo/Other  Hypothyroidism   Renal/GU negative Renal ROS  negative genitourinary   Musculoskeletal  (+) Arthritis ,   Abdominal   Peds  Hematology negative hematology ROS (+)   Anesthesia Other Findings   Reproductive/Obstetrics                            Anesthesia Physical Anesthesia Plan  ASA: III  Anesthesia Plan: General   Post-op Pain Management:    Induction: Intravenous  PONV Risk Score and Plan: 3 and Treatment may vary due to age or medical condition, Dexamethasone and Ondansetron  Airway Management Planned: Oral ETT  Additional Equipment: None  Intra-op Plan:   Post-operative Plan: Extubation in OR  Informed Consent: I have reviewed the patients History and Physical, chart, labs and discussed the procedure including the risks, benefits and alternatives for the proposed anesthesia with the patient or authorized representative who has indicated his/her understanding and  acceptance.   Dental advisory given  Plan Discussed with: CRNA  Anesthesia Plan Comments:         Anesthesia Quick Evaluation

## 2017-06-01 ENCOUNTER — Ambulatory Visit (HOSPITAL_COMMUNITY): Payer: 59 | Admitting: Vascular Surgery

## 2017-06-01 ENCOUNTER — Other Ambulatory Visit: Payer: Self-pay

## 2017-06-01 ENCOUNTER — Observation Stay (HOSPITAL_COMMUNITY): Payer: 59

## 2017-06-01 ENCOUNTER — Ambulatory Visit (HOSPITAL_COMMUNITY): Payer: 59

## 2017-06-01 ENCOUNTER — Encounter (HOSPITAL_COMMUNITY)
Admission: RE | Disposition: A | Discharge status: Home / Self Care | Payer: Self-pay | Source: Ambulatory Visit | Attending: Neurological Surgery

## 2017-06-01 ENCOUNTER — Encounter (HOSPITAL_COMMUNITY): Payer: Self-pay | Admitting: Anesthesiology

## 2017-06-01 ENCOUNTER — Observation Stay (HOSPITAL_COMMUNITY)
Admission: RE | Admit: 2017-06-01 | Discharge: 2017-06-01 | Disposition: A | Discharge status: Home / Self Care | Payer: 59 | Source: Ambulatory Visit | Attending: Neurological Surgery | Admitting: Neurological Surgery

## 2017-06-01 ENCOUNTER — Ambulatory Visit (HOSPITAL_COMMUNITY): Payer: 59 | Admitting: Anesthesiology

## 2017-06-01 DIAGNOSIS — Z952 Presence of prosthetic heart valve: Secondary | ICD-10-CM | POA: Diagnosis not present

## 2017-06-01 DIAGNOSIS — Z791 Long term (current) use of non-steroidal anti-inflammatories (NSAID): Secondary | ICD-10-CM | POA: Diagnosis not present

## 2017-06-01 DIAGNOSIS — Z79899 Other long term (current) drug therapy: Secondary | ICD-10-CM | POA: Insufficient documentation

## 2017-06-01 DIAGNOSIS — M1990 Unspecified osteoarthritis, unspecified site: Secondary | ICD-10-CM | POA: Insufficient documentation

## 2017-06-01 DIAGNOSIS — M48061 Spinal stenosis, lumbar region without neurogenic claudication: Secondary | ICD-10-CM | POA: Diagnosis not present

## 2017-06-01 DIAGNOSIS — I4891 Unspecified atrial fibrillation: Secondary | ICD-10-CM | POA: Diagnosis not present

## 2017-06-01 DIAGNOSIS — M5417 Radiculopathy, lumbosacral region: Secondary | ICD-10-CM | POA: Diagnosis not present

## 2017-06-01 DIAGNOSIS — Z9889 Other specified postprocedural states: Secondary | ICD-10-CM

## 2017-06-01 DIAGNOSIS — K219 Gastro-esophageal reflux disease without esophagitis: Secondary | ICD-10-CM | POA: Insufficient documentation

## 2017-06-01 DIAGNOSIS — M5117 Intervertebral disc disorders with radiculopathy, lumbosacral region: Secondary | ICD-10-CM | POA: Diagnosis not present

## 2017-06-01 DIAGNOSIS — Z8673 Personal history of transient ischemic attack (TIA), and cerebral infarction without residual deficits: Secondary | ICD-10-CM | POA: Insufficient documentation

## 2017-06-01 DIAGNOSIS — E039 Hypothyroidism, unspecified: Secondary | ICD-10-CM | POA: Diagnosis not present

## 2017-06-01 DIAGNOSIS — M5116 Intervertebral disc disorders with radiculopathy, lumbar region: Secondary | ICD-10-CM | POA: Diagnosis not present

## 2017-06-01 DIAGNOSIS — Z419 Encounter for procedure for purposes other than remedying health state, unspecified: Secondary | ICD-10-CM

## 2017-06-01 HISTORY — PX: LUMBAR LAMINECTOMY/DECOMPRESSION MICRODISCECTOMY: SHX5026

## 2017-06-01 LAB — PROTIME-INR
INR: 1.07
Prothrombin Time: 13.8 seconds (ref 11.4–15.2)

## 2017-06-01 SURGERY — LUMBAR LAMINECTOMY/DECOMPRESSION MICRODISCECTOMY 1 LEVEL
Anesthesia: General | Site: Back | Laterality: Right

## 2017-06-01 MED ORDER — METHOCARBAMOL 500 MG PO TABS
ORAL_TABLET | ORAL | Status: AC
  Filled 2017-06-01: qty 1

## 2017-06-01 MED ORDER — DABIGATRAN ETEXILATE MESYLATE 150 MG PO CAPS: 150.0000 mg | ORAL_CAPSULE | Freq: Two times a day (BID) | ORAL | Status: DC

## 2017-06-01 MED ORDER — HYDROCODONE-ACETAMINOPHEN 7.5-325 MG PO TABS
1.0000 | ORAL_TABLET | Freq: Four times a day (QID) | ORAL | Status: DC
  Administered 2017-06-01: 1 via ORAL
  Filled 2017-06-01 (×2): qty 1

## 2017-06-01 MED ORDER — ONDANSETRON HCL 4 MG/2ML IJ SOLN: 4.0000 mg | Freq: Once | INTRAMUSCULAR | Status: DC | PRN

## 2017-06-01 MED ORDER — ACETAMINOPHEN 325 MG PO TABS: 650.0000 mg | ORAL_TABLET | ORAL | Status: DC | PRN

## 2017-06-01 MED ORDER — METHYLPREDNISOLONE ACETATE 80 MG/ML IJ SUSP
INTRAMUSCULAR | Status: AC
  Filled 2017-06-01: qty 1

## 2017-06-01 MED ORDER — CEFAZOLIN SODIUM-DEXTROSE 2-4 GM/100ML-% IV SOLN
2.0000 g | INTRAVENOUS | Status: AC
  Administered 2017-06-01: 2 g via INTRAVENOUS
  Filled 2017-06-01: qty 100

## 2017-06-01 MED ORDER — BUPIVACAINE HCL (PF) 0.25 % IJ SOLN
INTRAMUSCULAR | Status: DC | PRN
  Administered 2017-06-01: 2 mL

## 2017-06-01 MED ORDER — PROPOFOL 10 MG/ML IV BOLUS
INTRAVENOUS | Status: DC | PRN
  Administered 2017-06-01: 150 mg via INTRAVENOUS

## 2017-06-01 MED ORDER — LIDOCAINE HCL (CARDIAC) 20 MG/ML IV SOLN
INTRAVENOUS | Status: DC | PRN
  Administered 2017-06-01: 80 mg via INTRAVENOUS

## 2017-06-01 MED ORDER — SODIUM CHLORIDE 0.9 % IR SOLN
Status: DC | PRN
  Administered 2017-06-01: 500 mL

## 2017-06-01 MED ORDER — PROPOFOL 10 MG/ML IV BOLUS
INTRAVENOUS | Status: AC
  Filled 2017-06-01: qty 20

## 2017-06-01 MED ORDER — ROCURONIUM BROMIDE 100 MG/10ML IV SOLN
INTRAVENOUS | Status: DC | PRN
  Administered 2017-06-01: 50 mg via INTRAVENOUS

## 2017-06-01 MED ORDER — CEFAZOLIN SODIUM-DEXTROSE 2-4 GM/100ML-% IV SOLN: 2.0000 g | Freq: Three times a day (TID) | INTRAVENOUS | Status: DC

## 2017-06-01 MED ORDER — SUGAMMADEX SODIUM 200 MG/2ML IV SOLN
INTRAVENOUS | Status: AC
  Filled 2017-06-01: qty 2

## 2017-06-01 MED ORDER — PHENYLEPHRINE HCL 10 MG/ML IJ SOLN
INTRAMUSCULAR | Status: DC | PRN
  Administered 2017-06-01: 80 ug via INTRAVENOUS

## 2017-06-01 MED ORDER — FLECAINIDE ACETATE 50 MG PO TABS
50.0000 mg | ORAL_TABLET | Freq: Two times a day (BID) | ORAL | Status: DC
  Filled 2017-06-01: qty 1

## 2017-06-01 MED ORDER — DEXAMETHASONE SODIUM PHOSPHATE 10 MG/ML IJ SOLN
INTRAMUSCULAR | Status: AC
  Filled 2017-06-01: qty 1

## 2017-06-01 MED ORDER — SODIUM CHLORIDE 0.9 % IV SOLN: 250.0000 mL | INTRAVENOUS | Status: DC

## 2017-06-01 MED ORDER — SODIUM CHLORIDE 0.9% FLUSH: 3.0000 mL | Freq: Two times a day (BID) | INTRAVENOUS | Status: DC

## 2017-06-01 MED ORDER — ROCURONIUM BROMIDE 10 MG/ML (PF) SYRINGE
PREFILLED_SYRINGE | INTRAVENOUS | Status: AC
  Filled 2017-06-01: qty 5

## 2017-06-01 MED ORDER — THROMBIN (RECOMBINANT) 5000 UNITS EX SOLR
OROMUCOSAL | Status: DC | PRN
  Administered 2017-06-01: 5 mL via TOPICAL

## 2017-06-01 MED ORDER — BUPIVACAINE HCL (PF) 0.25 % IJ SOLN
INTRAMUSCULAR | Status: AC
  Filled 2017-06-01: qty 30

## 2017-06-01 MED ORDER — POTASSIUM CHLORIDE IN NACL 20-0.9 MEQ/L-% IV SOLN: INTRAVENOUS | Status: DC

## 2017-06-01 MED ORDER — LACTATED RINGERS IV SOLN
INTRAVENOUS | Status: DC
  Administered 2017-06-01: 10:00:00 via INTRAVENOUS

## 2017-06-01 MED ORDER — LIDOCAINE 2% (20 MG/ML) 5 ML SYRINGE
INTRAMUSCULAR | Status: AC
  Filled 2017-06-01: qty 5

## 2017-06-01 MED ORDER — ACETAMINOPHEN 650 MG RE SUPP: 650.0000 mg | RECTAL | Status: DC | PRN

## 2017-06-01 MED ORDER — PHENOL 1.4 % MT LIQD: 1.0000 | OROMUCOSAL | Status: DC | PRN

## 2017-06-01 MED ORDER — CHLORHEXIDINE GLUCONATE CLOTH 2 % EX PADS: 6.0000 | MEDICATED_PAD | Freq: Once | CUTANEOUS | Status: DC

## 2017-06-01 MED ORDER — SENNA 8.6 MG PO TABS
1.0000 | ORAL_TABLET | Freq: Two times a day (BID) | ORAL | Status: DC
  Administered 2017-06-01: 8.6 mg via ORAL
  Filled 2017-06-01: qty 1

## 2017-06-01 MED ORDER — DEXAMETHASONE SODIUM PHOSPHATE 10 MG/ML IJ SOLN: 10.0000 mg | INTRAMUSCULAR | Status: DC

## 2017-06-01 MED ORDER — HYDROCODONE-ACETAMINOPHEN 7.5-325 MG PO TABS: 1.0000 | ORAL_TABLET | Freq: Four times a day (QID) | ORAL | 0 refills | Status: DC

## 2017-06-01 MED ORDER — MENTHOL 3 MG MT LOZG: 1.0000 | LOZENGE | OROMUCOSAL | Status: DC | PRN

## 2017-06-01 MED ORDER — ONDANSETRON HCL 4 MG PO TABS: 4.0000 mg | ORAL_TABLET | Freq: Four times a day (QID) | ORAL | Status: DC | PRN

## 2017-06-01 MED ORDER — SUGAMMADEX SODIUM 200 MG/2ML IV SOLN
INTRAVENOUS | Status: DC | PRN
  Administered 2017-06-01: 200 mg via INTRAVENOUS

## 2017-06-01 MED ORDER — MIDAZOLAM HCL 2 MG/2ML IJ SOLN
INTRAMUSCULAR | Status: AC
  Filled 2017-06-01: qty 2

## 2017-06-01 MED ORDER — PHENYLEPHRINE 40 MCG/ML (10ML) SYRINGE FOR IV PUSH (FOR BLOOD PRESSURE SUPPORT)
PREFILLED_SYRINGE | INTRAVENOUS | Status: AC
  Filled 2017-06-01: qty 10

## 2017-06-01 MED ORDER — SODIUM CHLORIDE 0.9% FLUSH: 3.0000 mL | INTRAVENOUS | Status: DC | PRN

## 2017-06-01 MED ORDER — FENTANYL CITRATE (PF) 100 MCG/2ML IJ SOLN
25.0000 ug | INTRAMUSCULAR | Status: DC | PRN
  Administered 2017-06-01 (×2): 50 ug via INTRAVENOUS

## 2017-06-01 MED ORDER — DEXAMETHASONE SODIUM PHOSPHATE 4 MG/ML IJ SOLN: 4.0000 mg | Freq: Four times a day (QID) | INTRAMUSCULAR | Status: DC

## 2017-06-01 MED ORDER — FENTANYL CITRATE (PF) 100 MCG/2ML IJ SOLN
INTRAMUSCULAR | Status: AC
  Administered 2017-06-01: 50 ug via INTRAVENOUS
  Filled 2017-06-01: qty 2

## 2017-06-01 MED ORDER — DEXAMETHASONE 4 MG PO TABS
4.0000 mg | ORAL_TABLET | Freq: Four times a day (QID) | ORAL | Status: DC
  Administered 2017-06-01: 4 mg via ORAL
  Filled 2017-06-01 (×2): qty 1

## 2017-06-01 MED ORDER — ONDANSETRON HCL 4 MG/2ML IJ SOLN
INTRAMUSCULAR | Status: DC | PRN
  Administered 2017-06-01: 4 mg via INTRAVENOUS

## 2017-06-01 MED ORDER — FENTANYL CITRATE (PF) 100 MCG/2ML IJ SOLN
INTRAMUSCULAR | Status: DC | PRN
  Administered 2017-06-01: 100 ug via INTRAVENOUS
  Administered 2017-06-01: 50 ug via INTRAVENOUS
  Administered 2017-06-01: 100 ug via INTRAVENOUS

## 2017-06-01 MED ORDER — PHENYLEPHRINE HCL 10 MG/ML IJ SOLN
INTRAVENOUS | Status: DC | PRN
  Administered 2017-06-01: 25 ug/min via INTRAVENOUS

## 2017-06-01 MED ORDER — 0.9 % SODIUM CHLORIDE (POUR BTL) OPTIME
TOPICAL | Status: DC | PRN
  Administered 2017-06-01: 1000 mL

## 2017-06-01 MED ORDER — DEXAMETHASONE SODIUM PHOSPHATE 10 MG/ML IJ SOLN
INTRAMUSCULAR | Status: DC | PRN
  Administered 2017-06-01: 10 mg via INTRAVENOUS

## 2017-06-01 MED ORDER — CELECOXIB 200 MG PO CAPS
200.0000 mg | ORAL_CAPSULE | Freq: Two times a day (BID) | ORAL | Status: DC
  Administered 2017-06-01: 200 mg via ORAL
  Filled 2017-06-01: qty 1

## 2017-06-01 MED ORDER — THROMBIN (RECOMBINANT) 5000 UNITS EX SOLR
CUTANEOUS | Status: AC
  Filled 2017-06-01: qty 5000

## 2017-06-01 MED ORDER — TAMSULOSIN HCL 0.4 MG PO CAPS
0.4000 mg | ORAL_CAPSULE | Freq: Every day | ORAL | Status: DC
  Administered 2017-06-01: 0.4 mg via ORAL
  Filled 2017-06-01: qty 1

## 2017-06-01 MED ORDER — FUROSEMIDE 20 MG PO TABS: 20.0000 mg | ORAL_TABLET | Freq: Every day | ORAL | Status: DC | PRN

## 2017-06-01 MED ORDER — LEVOTHYROXINE SODIUM 100 MCG PO TABS: 50.0000 ug | ORAL_TABLET | Freq: Every day | ORAL | Status: DC

## 2017-06-01 MED ORDER — ONDANSETRON HCL 4 MG/2ML IJ SOLN: 4.0000 mg | Freq: Four times a day (QID) | INTRAMUSCULAR | Status: DC | PRN

## 2017-06-01 MED ORDER — MIDAZOLAM HCL 5 MG/5ML IJ SOLN
INTRAMUSCULAR | Status: DC | PRN
  Administered 2017-06-01: 1 mg via INTRAVENOUS

## 2017-06-01 MED ORDER — METHOCARBAMOL 500 MG PO TABS
500.0000 mg | ORAL_TABLET | Freq: Four times a day (QID) | ORAL | Status: DC | PRN
  Administered 2017-06-01: 500 mg via ORAL

## 2017-06-01 MED ORDER — DEXTROSE 5 % IV SOLN: 500.0000 mg | Freq: Four times a day (QID) | INTRAVENOUS | Status: DC | PRN

## 2017-06-01 MED ORDER — FENTANYL CITRATE (PF) 250 MCG/5ML IJ SOLN
INTRAMUSCULAR | Status: AC
  Filled 2017-06-01: qty 5

## 2017-06-01 MED ORDER — ONDANSETRON HCL 4 MG/2ML IJ SOLN
INTRAMUSCULAR | Status: AC
  Filled 2017-06-01: qty 2

## 2017-06-01 MED ORDER — METHYLPREDNISOLONE ACETATE 80 MG/ML IJ SUSP
INTRAMUSCULAR | Status: DC | PRN
  Administered 2017-06-01: 80 mg

## 2017-06-01 MED FILL — HYDROCODON-APAP 7.5-325: 7.5-325 | 8 days supply | Qty: 30 | Fill #0

## 2017-06-01 SURGICAL SUPPLY — 50 items
BAG DECANTER FOR FLEXI CONT (MISCELLANEOUS) ×2 IMPLANT
BENZOIN TINCTURE PRP APPL 2/3 (GAUZE/BANDAGES/DRESSINGS) ×2 IMPLANT
BUR MATCHSTICK NEURO 3.0 LAGG (BURR) ×2 IMPLANT
CANISTER SUCT 3000ML PPV (MISCELLANEOUS) ×2 IMPLANT
CARTRIDGE OIL MAESTRO DRILL (MISCELLANEOUS) ×1 IMPLANT
DIFFUSER DRILL AIR PNEUMATIC (MISCELLANEOUS) ×2 IMPLANT
DRAPE LAPAROTOMY 100X72X124 (DRAPES) ×2 IMPLANT
DRAPE MICROSCOPE LEICA (MISCELLANEOUS) ×2 IMPLANT
DRAPE POUCH INSTRU U-SHP 10X18 (DRAPES) ×2 IMPLANT
DRAPE SURG 17X23 STRL (DRAPES) ×2 IMPLANT
DRSG OPSITE POSTOP 3X4 (GAUZE/BANDAGES/DRESSINGS) ×2 IMPLANT
DURAPREP 26ML APPLICATOR (WOUND CARE) ×2 IMPLANT
ELECT REM PT RETURN 9FT ADLT (ELECTROSURGICAL) ×2
ELECTRODE REM PT RTRN 9FT ADLT (ELECTROSURGICAL) ×1 IMPLANT
GAUZE SPONGE 4X4 16PLY XRAY LF (GAUZE/BANDAGES/DRESSINGS) IMPLANT
GLOVE BIO SURGEON STRL SZ 6 (GLOVE) ×2 IMPLANT
GLOVE BIO SURGEON STRL SZ7 (GLOVE) ×2 IMPLANT
GLOVE BIO SURGEON STRL SZ8 (GLOVE) ×4 IMPLANT
GLOVE BIO SURGEON STRL SZ8.5 (GLOVE) ×2 IMPLANT
GLOVE BIOGEL PI IND STRL 6.5 (GLOVE) ×1 IMPLANT
GLOVE BIOGEL PI IND STRL 7.0 (GLOVE) ×2 IMPLANT
GLOVE BIOGEL PI INDICATOR 6.5 (GLOVE) ×1
GLOVE BIOGEL PI INDICATOR 7.0 (GLOVE) ×2
GLOVE SURG SS PI 6.5 STRL IVOR (GLOVE) ×2 IMPLANT
GOWN STRL REUS W/ TWL LRG LVL3 (GOWN DISPOSABLE) ×2 IMPLANT
GOWN STRL REUS W/ TWL XL LVL3 (GOWN DISPOSABLE) ×2 IMPLANT
GOWN STRL REUS W/TWL 2XL LVL3 (GOWN DISPOSABLE) IMPLANT
GOWN STRL REUS W/TWL LRG LVL3 (GOWN DISPOSABLE) ×2
GOWN STRL REUS W/TWL XL LVL3 (GOWN DISPOSABLE) ×2
HEMOSTAT POWDER KIT SURGIFOAM (HEMOSTASIS) ×2 IMPLANT
KIT BASIN OR (CUSTOM PROCEDURE TRAY) ×2 IMPLANT
KIT ROOM TURNOVER OR (KITS) ×2 IMPLANT
NEEDLE HYPO 18GX1.5 BLUNT FILL (NEEDLE) ×2 IMPLANT
NEEDLE HYPO 25X1 1.5 SAFETY (NEEDLE) ×2 IMPLANT
NEEDLE SPNL 20GX3.5 QUINCKE YW (NEEDLE) IMPLANT
NS IRRIG 1000ML POUR BTL (IV SOLUTION) ×2 IMPLANT
OIL CARTRIDGE MAESTRO DRILL (MISCELLANEOUS) ×2
PACK LAMINECTOMY NEURO (CUSTOM PROCEDURE TRAY) ×2 IMPLANT
PAD ARMBOARD 7.5X6 YLW CONV (MISCELLANEOUS) ×8 IMPLANT
RUBBERBAND STERILE (MISCELLANEOUS) ×4 IMPLANT
SPONGE SURGIFOAM ABS GEL SZ50 (HEMOSTASIS) IMPLANT
STRIP CLOSURE SKIN 1/2X4 (GAUZE/BANDAGES/DRESSINGS) ×2 IMPLANT
SUT VIC AB 0 CT1 18XCR BRD8 (SUTURE) ×1 IMPLANT
SUT VIC AB 0 CT1 8-18 (SUTURE) ×1
SUT VIC AB 2-0 CP2 18 (SUTURE) ×2 IMPLANT
SUT VIC AB 3-0 SH 8-18 (SUTURE) ×4 IMPLANT
SYR 5ML LL (SYRINGE) ×2 IMPLANT
TOWEL GREEN STERILE (TOWEL DISPOSABLE) ×2 IMPLANT
TOWEL GREEN STERILE FF (TOWEL DISPOSABLE) ×2 IMPLANT
WATER STERILE IRR 1000ML POUR (IV SOLUTION) ×2 IMPLANT

## 2017-06-01 NOTE — Progress Notes (Signed)
Patient is discharged from room 3C07 at this time. Alert and in stable condition. IV site d/c'd and instructions read to patient and spouse with understanding verbalized. Left unit via wheelchair with all belongings at side. 

## 2017-06-01 NOTE — Transfer of Care (Signed)
Immediate Anesthesia Transfer of Care Note  Patient: Keith [name]  Procedure(s) Performed: EXTRAFORAMINAL MICRODISCECTOMY LUMBAR FIVE- SACRAL ONE, RIGHT (Right Back)  Patient Location: PACU  Anesthesia Type:General  Level of Consciousness: awake, alert , oriented and patient cooperative  Airway & Oxygen Therapy: Patient Spontanous Breathing  Post-op Assessment: Report given to RN, Post -op Vital signs reviewed and stable and Patient moving all extremities  Post vital signs: Reviewed and stable  Last Vitals:  Vitals:   06/01/17 0820 06/01/17 1210  BP: (!) 167/98   Pulse: 68 62  Resp: 18 13  Temp: 36.6 C 36.5 C  SpO2: 99% 96%    Last Pain:  Vitals:   06/01/17 0820  TempSrc: Oral         Complications: No apparent anesthesia complications

## 2017-06-01 NOTE — Care Management Note (Signed)
Case Management Note  Patient Details  Name: Keith [name] MRN: [id number] Date of Birth: 05/22/1941  Subjective/Objective:   From home, s/p Right L5-S1 extraforaminal decompression and microdiscectomy utilizing microscopic dissection.                   Action/Plan: NCM will follow for dc needs.   Expected Discharge Date:  06/01/17               Expected Discharge Plan:  Home/Self Care  In-House Referral:     Discharge planning Services  CM Consult  Post Acute Care Choice:    Choice offered to:     DME Arranged:    DME Agency:     HH Arranged:    HH Agency:     Status of Service:  Completed, signed off  If discussed at [employer] of Stay Meetings, dates discussed:    Additional Comments:  Zenon Mayo, RN 06/01/2017, 4:13 PM

## 2017-06-01 NOTE — Discharge Summary (Signed)
Physician Discharge Summary  Patient ID: Keith [name] MRN: [id number] DOB/AGE: [birth year] 76 y.o.  Admit date: 06/01/2017 Discharge date: 06/01/2017  Admission Diagnoses: foraminal stenosis    Discharge Diagnoses: same   Discharged Condition: good  Hospital Course: The patient was admitted on 06/01/2017 and taken to the operating room where the patient underwent R L5-S1 extraforaminal diskectomy. The patient tolerated the procedure well and was taken to the recovery room and then to the floor in stable condition. The hospital course was routine. There were no complications. The wound remained clean dry and intact. Pt had appropriate back soreness. No complaints of leg pain or new N/T/W. The patient remained afebrile with stable vital signs, and tolerated a regular diet. The patient continued to increase activities, and pain was well controlled with oral pain medications. He will resume his pradaxa in 5 days  Consults: None  Significant Diagnostic Studies:  Results for orders placed or performed during the hospital encounter of 06/01/17  Protime-INR  Result Value Ref Range   Prothrombin Time 13.8 11.4 - 15.2 seconds   INR 1.07     Dg Lumbar Spine 1 View  Result Date: 06/01/2017 CLINICAL DATA:  L5-S1 microdiscectomy. EXAM: LUMBAR SPINE - 1 VIEW COMPARISON:  MRI lumbar spine dated February 05, 2017. FINDINGS: Single lateral intraoperative x-ray demonstrating surgical instrumentation posterior to the L5 and L5-S1 levels. Multilevel degenerative changes of the lumbar spine are again noted. IMPRESSION: Surgical instrumentation posterior to the L5 and L5-S1 levels. Electronically Signed   By: Titus Dubin M.D.   On: 06/01/2017 12:15    Antibiotics:  Anti-infectives (From admission, onward)   Start     Dose/Rate Route Frequency Ordered Stop   06/01/17 1830  ceFAZolin (ANCEF) IVPB 2g/100 mL premix     2 g 200 mL/hr over 30 Minutes Intravenous Every 8 hours 06/01/17 1303 06/02/17 1029   06/01/17 1027  bacitracin 50,000 Units in sodium chloride irrigation 0.9 % 500 mL irrigation  Status:  Discontinued       As needed 06/01/17 1028 06/01/17 1202   06/01/17 0819  ceFAZolin (ANCEF) IVPB 2g/100 mL premix     2 g 200 mL/hr over 30 Minutes Intravenous On call to O.R. 06/01/17 0819 06/01/17 1055      Discharge Exam: Blood pressure (!) 149/81, pulse 63, temperature (!) 97.5 F (36.4 C), resp. rate 16, height 5\' 11"  (1.803 m), weight 86.9 kg (191 lb 8 oz), SpO2 98 %. Neurologic: Grossly normal Dressing dry  Discharge Medications:   Allergies as of 06/01/2017   No Known Allergies     Medication List    TAKE these medications   flecainide 50 MG tablet Commonly known as:  TAMBOCOR Take 1 tablet 2 (two) times daily by mouth.   furosemide 20 MG tablet Commonly known as:  LASIX Take 20 mg daily as needed by mouth for edema.   GLUCOSAMINE MSM COMPLEX Tabs Take 2 tablets daily by mouth.   HYDROcodone-acetaminophen 7.5-325 MG tablet Commonly known as:  NORCO Take 1 tablet by mouth every 6 (six) hours.   ibuprofen 200 MG tablet Commonly known as:  ADVIL,MOTRIN Take 400 mg every 6 (six) hours as needed by mouth for moderate pain.   potassium chloride 20 MEQ packet Commonly known as:  KLOR-CON Take 20 mEq daily as needed by mouth (TAKES ONLY WITH LASIX).   PRADAXA 150 MG Caps capsule Generic drug:  dabigatran Take 150 mg by mouth 2 (two) times daily.   SYNTHROID 50 MCG tablet Generic  drug:  levothyroxine Take 50 mcg by mouth daily before breakfast.   tamsulosin 0.4 MG Caps capsule Commonly known as:  FLOMAX Take 0.4 mg by mouth daily.       Disposition: home   Final Dx: extraforaminal diskectomy       Signed: Bocock,Levora Werden S 06/01/2017, 2:19 PM

## 2017-06-01 NOTE — H&P (Signed)
Subjective: Patient is a 76 y.o. male admitted for R leg pain. Onset of symptoms was several months ago, gradually worsening since that time.  The pain is rated severe, and is located at the across the lower back and radiates to RLE. The pain is described as aching and occurs all day. The symptoms have been progressive. Symptoms are exacerbated by exercise. 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 Aortic valve replaced   . Arthritis   . Cancer ([place])    skin cancer  . Dysrhythmia    HX A FLUTTER  . GERD (gastroesophageal reflux disease)   . History of kidney stones   . Stroke ([place])    approx 8 years    Past Surgical History:  Procedure Laterality Date  . AORTIC VALVE REPLACEMENT    . BACK SURGERY    . EPIGASTRIC HERNIA REPAIR      Prior to Admission medications   Medication Sig Start Date End Date Taking? Authorizing Provider  dabigatran (PRADAXA) 150 MG CAPS capsule Take 150 mg by mouth 2 (two) times daily.   Yes [provider]  flecainide (TAMBOCOR) 50 MG tablet Take 1 tablet 2 (two) times daily by mouth. 03/31/16  Yes [provider]  furosemide (LASIX) 20 MG tablet Take 20 mg daily as needed by mouth for edema.    Yes [provider]  ibuprofen (ADVIL,MOTRIN) 200 MG tablet Take 400 mg every 6 (six) hours as needed by mouth for moderate pain.   Yes [provider]  levothyroxine (SYNTHROID) 50 MCG tablet Take 50 mcg by mouth daily before breakfast.   Yes [provider]  tamsulosin (FLOMAX) 0.4 MG CAPS capsule Take 0.4 mg by mouth daily.   Yes [provider]  Glucos-MSM-C-Mn-Ginger-Willow (GLUCOSAMINE MSM COMPLEX) TABS Take 2 tablets daily by mouth.     [provider]  potassium chloride (KLOR-CON) 20 MEQ packet Take 20 mEq daily as needed by mouth (TAKES ONLY WITH LASIX).     [provider]   No Known Allergies  Social History   Tobacco Use  . Smoking status:  Never Smoker  . Smokeless tobacco: Never Used  Substance Use Topics  . Alcohol use: Yes    Comment: occ    History reviewed. No pertinent family history.   Review of Systems  Positive ROS: neg  All other systems have been reviewed and were otherwise negative with the exception of those mentioned in the HPI and as above.  Objective: Vital signs in last 24 hours: Temp:  [97.8 F (36.6 C)] 97.8 F (36.6 C) (11/21 0820) Pulse Rate:  [68] 68 (11/21 0820) Resp:  [18] 18 (11/21 0820) BP: (167)/(98) 167/98 (11/21 0820) SpO2:  [99 %] 99 % (11/21 0820) Weight:  [86.9 kg (191 lb 8 oz)] 86.9 kg (191 lb 8 oz) (11/21 0820)  General Appearance: Alert, cooperative, no distress, appears stated age Head: Normocephalic, without obvious abnormality, atraumatic Eyes: PERRL, conjunctiva/corneas clear, EOM's intact    Neck: Supple, symmetrical, trachea midline Back: Symmetric, no curvature, ROM normal, no CVA tenderness Lungs:  respirations unlabored Heart: Regular rate and rhythm Abdomen: Soft, non-tender Extremities: Extremities normal, atraumatic, no cyanosis or edema Pulses: 2+ and symmetric all extremities Skin: Skin color, texture, turgor normal, no rashes or lesions  NEUROLOGIC:   Mental status: Alert and oriented x4,  no aphasia, good attention span, fund of knowledge, and memory Motor Exam - grossly normal Sensory Exam -  grossly normal Reflexes: 1+ Coordination - grossly normal Gait - grossly normal Balance - grossly normal Cranial Nerves: I: smell Not tested  II: visual acuity  OS: nl    OD: nl  II: visual fields Full to confrontation  II: pupils Equal, round, reactive to light  III,VII: ptosis None  III,IV,VI: extraocular muscles  Full ROM  V: mastication Normal  V: facial light touch sensation  Normal  V,VII: corneal reflex  Present  VII: facial muscle function - upper  Normal  VII: facial muscle function - lower Normal  VIII: hearing Not tested  IX: soft palate  elevation  Normal  IX,X: gag reflex Present  XI: trapezius strength  5/5  XI: sternocleidomastoid strength 5/5  XI: neck flexion strength  5/5  XII: tongue strength  Normal    Data Review Lab Results  Component Value Date   WBC 6.6 05/24/2017   HGB 14.6 05/24/2017   HCT 43.2 05/24/2017   MCV 91.5 05/24/2017   PLT 166 05/24/2017   Lab Results  Component Value Date   NA 138 05/24/2017   K 4.0 05/24/2017   CL 108 05/24/2017   CO2 24 05/24/2017   BUN 23 (H) 05/24/2017   CREATININE 1.09 05/24/2017   GLUCOSE 97 05/24/2017   Lab Results  Component Value Date   INR 1.07 06/01/2017    Assessment/Plan: Patient admitted for R L5-S1 extraforaminal diskectomy. Patient has failed a reasonable attempt at conservative therapy.  I explained the condition and procedure to the patient and answered any questions.  Patient wishes to proceed with procedure as planned. Understands risks/ benefits and typical outcomes of procedure.   Lounsbury,Bronsen Serano S 06/01/2017 10:12 AM

## 2017-06-01 NOTE — Anesthesia Postprocedure Evaluation (Signed)
Anesthesia Post Note  Patient: Keith [name]  Procedure(s) Performed: EXTRAFORAMINAL MICRODISCECTOMY LUMBAR FIVE- SACRAL ONE, RIGHT (Right Back)     Patient location during evaluation: PACU Anesthesia Type: General Level of consciousness: awake and alert Pain management: pain level controlled Vital Signs Assessment: post-procedure vital signs reviewed and stable Respiratory status: spontaneous breathing, nonlabored ventilation, respiratory function stable and patient connected to nasal cannula oxygen Cardiovascular status: blood pressure returned to baseline and stable Postop Assessment: no apparent nausea or vomiting Anesthetic complications: no    Last Vitals:  Vitals:   06/01/17 1250 06/01/17 1313  BP: 113/84 (!) 149/81  Pulse: 64 63  Resp: 16 16  Temp: 36.5 C (!) 36.4 C  SpO2: 92% 98%    Last Pain:  Vitals:   06/01/17 1420  TempSrc:   PainSc: [place]

## 2017-06-01 NOTE — Anesthesia Procedure Notes (Signed)
Procedure Name: Intubation Date/Time: 06/01/2017 10:40 AM Performed by: Kerby Less, CRNA Pre-anesthesia Checklist: Patient identified, Emergency Drugs available, Suction available and Patient being monitored Patient Re-evaluated:Patient Re-evaluated prior to induction Oxygen Delivery Method: Circle System Utilized Preoxygenation: Pre-oxygenation with 100% oxygen Induction Type: IV induction Ventilation: Mask ventilation without difficulty Laryngoscope Size: Mac and 4 Grade View: Grade I Tube type: Oral Tube size: 8.0 mm Number of attempts: 1 Airway Equipment and Method: Stylet Placement Confirmation: ETT inserted through vocal cords under direct vision,  positive ETCO2 and breath sounds checked- equal and bilateral Secured at: 21 cm Tube secured with: Tape Dental Injury: Teeth and Oropharynx as per pre-operative assessment

## 2017-06-01 NOTE — Op Note (Signed)
06/01/2017  12:00 PM  PATIENT:  Keith [name]  76 y.o. 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 Arnoldo Morale  ANESTHESIA:   General  EBL: 20 ml  No intake/output data recorded.  BLOOD ADMINISTERED: none  DRAINS: none  SPECIMEN:  none  INDICATION FOR PROCEDURE: This patient presented with severe right leg pain in an L5 distribution. Imaging showed severe foraminal stenosis L5-S1 on the right. The patient tried conservative measures without relief. Pain was debilitating. Recommended right L5-S1 external decompression and discectomy. Patient understood the risks, benefits, and alternatives and potential outcomes and wished to proceed.  PROCEDURE DETAILS: The patient was taken to the operating room and after induction of adequate generalized endotracheal anesthesia, the patient was rolled into the prone position on the Wilson frame and all pressure points were padded. The lumbar region was cleaned and then prepped with DuraPrep and draped in the usual sterile fashion. 5 cc of local anesthesia was injected and then a dorsal midline incision was made and carried down to the lumbo sacral fascia. The fascia was opened and the paraspinous musculature was taken down in a subperiosteal fashion to expose L5-S1 and L4-5 on the right. Intraoperative x-ray confirmed my level, and then I carried my dissection out over the pars and lateral facet at L5-S1 and L4-5 on the right. I used a combination of the high-speed drill and the Kerrison punches to perform an extraforaminal decompression at L5-S1 on the right. I drilled with the lateral part of the pars and the superior part of the facet. I drilled down through the superior facet of S1. This got me into the triangle  overlying the disc. The underlying ligament was opened and removed in a piecemeal fashion to expose the exiting nerve root. There was a moderate-sized disc herniation underlying the L5 nerve root. I incised the disc and performed a thorough discectomy to decompress the L5 nerve root. The nerve root appeared to be well decompressed.  I then palpated with a coronary dilator along the nerve root and into the foramen to assure adequate decompression. I felt no more compression of the nerve root. I irrigated with saline solution containing bacitracin. Achieved hemostasis with bipolar cautery, lined the dura with Depo-Medrol, and then closed the fascia with 0 Vicryl. I closed the subcutaneous tissues with 2-0 Vicryl and the subcuticular tissues with 3-0 Vicryl. The skin was then closed with benzoin and Steri-Strips. The drapes were removed, a sterile dressing was applied. The patient was awakened from general anesthesia and transferred to the recovery room in stable condition. At the end of the procedure all sponge, needle and instrument counts were correct.    PLAN OF CARE: Admit for overnight observation  PATIENT DISPOSITION:  PACU - hemodynamically stable.   Delay start of Pharmacological VTE agent (>24hrs) due to surgical blood loss or risk of bleeding:  yes

## 2017-06-01 NOTE — Discharge Instructions (Signed)
RESTART PRADAXA ON [facility] 06/06/17

## 2017-06-02 ENCOUNTER — Encounter (HOSPITAL_COMMUNITY): Payer: Self-pay | Admitting: Neurological Surgery

## 2017-06-03 ENCOUNTER — Other Ambulatory Visit: Payer: Self-pay

## 2017-06-03 NOTE — Patient Outreach (Signed)
[place] [facility]) Care Management  06/03/2017  Armstrong Creasy 1940/07/14 [id number]   Telephone call for transition of care call.  Member was hospitalized for extraforaminal  microdiskectomy of lumbar 5, sacral 1 on 06/01/17 and was discharged on 06/01/17. Subjective:  States that he is not having much pain and he does not like to take much pain medications.  States that his dressing is still on and he can remove it tomorrow.  States his wife has gone back to work today and he is doing good.  States he is to go back to see Dr.Hosek in 2 weeks.   Objective: S/p extraforaminal  microdiskectomy of lumbar 5, sacral 111/21/18 Assessment:  S/p extraforaminal  microdiskectomy of lumbar 5, sacral 1 on 06/01/17. Transition of care call    Patient was recently discharged from hospital and all medications have been reviewed. Transition of care phone call completed. Reviewed discharge instructions and s/s call MD Instructed on importance of keeping follow up appt with MD. Successful outreach letter to be sent Case closed as member assessed with no further intervention needed. Peter Garter RN, [facility] Care Management Coordinator-Link to [place] [facility] [phone]

## 2017-06-07 MED FILL — METHYLPREDNISOLONE 4 MG TAB: 4 | 6 days supply | Qty: 21 | Fill #0

## 2017-06-22 DIAGNOSIS — Z952 Presence of prosthetic heart valve: Secondary | ICD-10-CM | POA: Diagnosis not present

## 2017-06-22 DIAGNOSIS — I481 Persistent atrial fibrillation: Secondary | ICD-10-CM | POA: Diagnosis not present

## 2017-06-22 DIAGNOSIS — I10 Essential (primary) hypertension: Secondary | ICD-10-CM | POA: Diagnosis not present

## 2017-06-22 DIAGNOSIS — R9431 Abnormal electrocardiogram [ECG] [EKG]: Secondary | ICD-10-CM | POA: Diagnosis not present

## 2017-06-22 DIAGNOSIS — I4892 Unspecified atrial flutter: Secondary | ICD-10-CM | POA: Diagnosis not present

## 2017-07-28 MED FILL — XARELTO 20 MG TABLET: 20 | 90 days supply | Qty: 90 | Fill #0

## 2017-08-10 ENCOUNTER — Ambulatory Visit (INDEPENDENT_AMBULATORY_CARE_PROVIDER_SITE_OTHER): Payer: 59 | Admitting: Physician Assistant

## 2017-08-10 ENCOUNTER — Other Ambulatory Visit: Payer: Self-pay

## 2017-08-10 ENCOUNTER — Encounter: Payer: Self-pay | Admitting: Physician Assistant

## 2017-08-10 VITALS — BP 126/80 | HR 71 | Temp 98.1°F | Resp 14 | Ht 69.5 in | Wt 195.0 lb

## 2017-08-10 DIAGNOSIS — I10 Essential (primary) hypertension: Secondary | ICD-10-CM

## 2017-08-10 DIAGNOSIS — N401 Enlarged prostate with lower urinary tract symptoms: Secondary | ICD-10-CM | POA: Diagnosis not present

## 2017-08-10 DIAGNOSIS — R351 Nocturia: Secondary | ICD-10-CM

## 2017-08-10 DIAGNOSIS — E039 Hypothyroidism, unspecified: Secondary | ICD-10-CM | POA: Diagnosis not present

## 2017-08-10 DIAGNOSIS — I4892 Unspecified atrial flutter: Secondary | ICD-10-CM | POA: Insufficient documentation

## 2017-08-10 DIAGNOSIS — I480 Paroxysmal atrial fibrillation: Secondary | ICD-10-CM

## 2017-08-10 MED ORDER — TAMSULOSIN HCL 0.4 MG PO CAPS: 0.4000 mg | ORAL_CAPSULE | Freq: Every day | ORAL | 0 refills | Status: DC

## 2017-08-10 MED ORDER — LEVOTHYROXINE SODIUM 50 MCG PO TABS: 50.0000 ug | ORAL_TABLET | Freq: Every day | ORAL | 0 refills | Status: DC

## 2017-08-10 MED FILL — TAMSULOSIN HCL 0.4 MG CAP: 0.4 | 90 days supply | Qty: 90 | Fill #0

## 2017-08-10 MED FILL — LEVOTHYROXINE 50 MCG TABLET: 50 | 90 days supply | Qty: 90 | Fill #0

## 2017-08-10 NOTE — Assessment & Plan Note (Signed)
Restart Flomax. Refill sent. Referral to Urology placed for ongoing management per patient request.

## 2017-08-10 NOTE — Assessment & Plan Note (Signed)
Rate controlled today. Asymptomatic. Continue Xarelto. Referral to EP placed for further management after move from [place].

## 2017-08-10 NOTE — Assessment & Plan Note (Signed)
Restart levothyroxine 50 mcg daily. Medications refilled. Follow-up 4 weeks for CPE. Will repeat TSH at that time.

## 2017-08-10 NOTE — Patient Instructions (Signed)
Please restart the Flomax and Levothyroxine daily. Follow-up in 3-4 weeks for reassessment and a complete physical with fasting labs.   You will be contacted for an assessment by Cardiology and Urology.  Keep well-hydrated and get plenty of rest. Over-the-counter Delsym for cough.  Saline nasal rinses for any nasal congestions. Symptoms seem viral and should continue to improve.   Welcome to [facility]!

## 2017-08-10 NOTE — Assessment & Plan Note (Signed)
Patient taken off of medications by prior Cardiologist due to side effect and hypotension. Normotensive today. Asymptomatic.

## 2017-08-10 NOTE — Progress Notes (Signed)
Patient presents to clinic today to establish care.  Patient endorses 1.5 days of nasal congestion, chest congestion and productive cough. States he was fatigued yesterday and made sure to get plenty of rest. Is feeling much better today. Is not taking anything today for symptoms.  Chronic Issues: Atrial Flutter/Atrial Fibrillation -- Followed by Cardiology in [place]. Is currently on Xarelto 20 mg. Was on Flecainide and then transitioned to Metoprolol, both of which he could not tolerate. Specialist in [place] stopped medications. Was to be scheduled for EP assessment and ablation but moved before this was completed. Patient denies chest pain, ightheadedness, dizziness, vision changes or frequent headaches.  Hypothyroidism -- Currently on regimen of levothyroxine 50 mcg daily. Is taking as directed. Has been out of medication for 2 weeks. Last TSH in December reviewed in care everywhere and within normal limits.  BPH -- Currently on Flomax 0.4 mg daily with good results. Has been out of medication for a few weeks. Notes recurrence of lower urinary tract symptoms since being out of medication. Nocturia x 3 per night. Is requesting referral to local Urology.   Past Medical History:  Diagnosis Date  . Allergy   . Aortic valve replaced   . Arthritis   . Cancer ([place])    skin cancer  . Dysrhythmia    HX A FLUTTER  . GERD (gastroesophageal reflux disease)   . History of chicken pox   . History of kidney stones   . Stroke ([place])    approx 8 years    Past Surgical History:  Procedure Laterality Date  . AORTIC VALVE REPLACEMENT    . BACK SURGERY    . EPIGASTRIC HERNIA REPAIR    . LUMBAR LAMINECTOMY/DECOMPRESSION MICRODISCECTOMY Right 06/01/2017   Procedure: EXTRAFORAMINAL MICRODISCECTOMY LUMBAR FIVE- SACRAL ONE, RIGHT;  Surgeon: Eustace Moore, MD;  Location: [facility];  Service: Neurosurgery;  Laterality: Right;    Current Outpatient Medications on File Prior to Visit  Medication Sig Dispense  Refill  . Glucos-MSM-C-Mn-Ginger-Willow (GLUCOSAMINE MSM COMPLEX) TABS Take 2 tablets daily by mouth.     . rivaroxaban (XARELTO) 20 MG TABS tablet Take 1 tablet by mouth daily.     No current facility-administered medications on file prior to visit.     No Known Allergies  History reviewed. No pertinent family history.  Social History   Socioeconomic History  . Marital status: Married    Spouse name: Arbie Cookey  . Number of children: Not on file  . Years of education: Not on file  . Highest education level: Not on file  Social Needs  . Financial resource strain: Not on file  . Food insecurity - worry: Not on file  . Food insecurity - inability: Not on file  . Transportation needs - medical: Not on file  . Transportation needs - non-medical: Not on file  Occupational History  . Not on file  Tobacco Use  . Smoking status: Never Smoker  . Smokeless tobacco: Never Used  Substance and Sexual Activity  . Alcohol use: No    Frequency: Never  . Drug use: No  . Sexual activity: Yes  Other Topics Concern  . Not on file  Social History Narrative  . Not on file   Review of Systems  Constitutional: Negative for fever and weight loss.  HENT: Negative for ear discharge, ear pain, hearing loss and tinnitus.   Eyes: Negative for blurred vision, double vision, photophobia and pain.  Respiratory: Negative for cough and shortness of breath.  Cardiovascular: Positive for palpitations (chronic). Negative for chest pain.  Gastrointestinal: Negative for abdominal pain, blood in stool, constipation, diarrhea, heartburn, melena, nausea and vomiting.  Genitourinary: Negative for dysuria, flank pain, frequency, hematuria and urgency.  Musculoskeletal: Negative for falls.  Neurological: Negative for dizziness, loss of consciousness and headaches.  Endo/Heme/Allergies: Negative for environmental allergies.  Psychiatric/Behavioral: Negative for depression, hallucinations, substance abuse and  suicidal ideas. The patient is not nervous/anxious and does not have insomnia.     BP 126/80   Pulse 71   Temp 98.1 F (36.7 C) (Oral)   Resp 14   Ht 5' 9.5" (1.765 m)   Wt 195 lb (88.5 kg)   SpO2 94%   BMI 28.38 kg/m   Physical Exam  Constitutional: He is oriented to person, place, and time and well-developed, well-nourished, and in no distress.  HENT:  Head: Normocephalic and atraumatic.  Eyes: Conjunctivae and EOM are normal. Pupils are equal, round, and reactive to light.  Neck: Neck supple.  Cardiovascular: Normal rate, normal heart sounds and intact distal pulses.  Irregularly irregular rhythm of his PAF noted.  Neurological: He is alert and oriented to person, place, and time. No cranial nerve deficit.  Skin: Skin is warm and dry. No rash noted.  Psychiatric: Affect normal.  Vitals reviewed.  Recent Results (from the past 2160 hour(s))  Basic metabolic panel     Status: Abnormal   Collection Time: 05/24/17 10:30 AM  Result Value Ref Range   Sodium 138 135 - 145 mmol/L   Potassium 4.0 3.5 - 5.1 mmol/L   Chloride 108 101 - 111 mmol/L   CO2 24 22 - 32 mmol/L   Glucose, Bld 97 65 - 99 mg/dL   BUN 23 (H) 6 - 20 mg/dL   Creatinine, Ser 1.09 0.61 - 1.24 mg/dL   Calcium 8.6 (L) 8.9 - 10.3 mg/dL   GFR calc non Af Amer >60 >60 mL/min   GFR calc Af Amer >60 >60 mL/min    Comment: (NOTE) The eGFR has been calculated using the CKD EPI equation. This calculation has not been validated in all clinical situations. eGFR's persistently <60 mL/min signify possible Chronic Kidney Disease.    Anion gap 6 5 - 15  CBC WITH DIFFERENTIAL     Status: None   Collection Time: 05/24/17 10:30 AM  Result Value Ref Range   WBC 6.6 4.0 - 10.5 K/uL   RBC 4.72 4.22 - 5.81 MIL/uL   Hemoglobin 14.6 13.0 - 17.0 g/dL   HCT 43.2 39.0 - 52.0 %   MCV 91.5 78.0 - 100.0 fL   MCH 30.9 26.0 - 34.0 pg   MCHC 33.8 30.0 - 36.0 g/dL   RDW 12.9 11.5 - 15.5 %   Platelets 166 150 - 400 K/uL    Neutrophils Relative % 68 %   Neutro Abs 4.5 1.7 - 7.7 K/uL   Lymphocytes Relative 19 %   Lymphs Abs 1.2 0.7 - 4.0 K/uL   Monocytes Relative 9 %   Monocytes Absolute 0.6 0.1 - 1.0 K/uL   Eosinophils Relative 4 %   Eosinophils Absolute 0.3 0.0 - 0.7 K/uL   Basophils Relative 0 %   Basophils Absolute 0.0 0.0 - 0.1 K/uL  Surgical pcr screen     Status: None   Collection Time: 05/24/17 10:37 AM  Result Value Ref Range   MRSA, PCR NEGATIVE NEGATIVE   Staphylococcus aureus NEGATIVE NEGATIVE    Comment: (NOTE) The Xpert SA Assay (FDA approved for  NASAL specimens in patients 88 years of age and older), is one component of a comprehensive surveillance program. It is not intended to diagnose infection nor to guide or monitor treatment.   Protime-INR     Status: None   Collection Time: 06/01/17  8:25 AM  Result Value Ref Range   Prothrombin Time 13.8 11.4 - 15.2 seconds   INR 1.07     Assessment/Plan: Hypertension Patient taken off of medications by prior Cardiologist due to side effect and hypotension. Normotensive today. Asymptomatic.   PAF (paroxysmal atrial fibrillation) (HCC) Rate controlled today. Asymptomatic. Continue Xarelto. Referral to EP placed for further management after move from [place].  Acquired hypothyroidism Restart levothyroxine 50 mcg daily. Medications refilled. Follow-up 4 weeks for CPE. Will repeat TSH at that time.   Benign prostatic hyperplasia with nocturia Restart Flomax. Refill sent. Referral to Urology placed for ongoing management per patient request.     Leeanne Rio, PA-C

## 2017-09-07 ENCOUNTER — Encounter: Admitting: Physician Assistant

## 2017-09-07 ENCOUNTER — Encounter: Payer: Self-pay | Admitting: Cardiology

## 2017-09-07 NOTE — Progress Notes (Signed)
Cardiology Office Note  Date: 09/08/2017   ID: Keith [name], DOB January 23, 1941, MRN [id number]  PCP: Brunetta Jeans, PA-C  Consulting Cardiologist: Rozann Lesches, MD   Chief Complaint  Patient presents with  . Atrial fibrillation and flutter    History of Present Illness: Keith [name] is a 77 y.o. male referred for cardiology consultation by Mr. Cyndi Lennert for evaluation of atrial fibrillation.  He recently moved to this area from [place].  Records indicate previous cardiology follow-up with [facility], Dr. Lorne Skeens, last seen in December 2018.  He has a history of paroxysmal atrial fibrillation and flutter, bioprosthetic AVR and root replacement (#27 Edwards magna valve) 2009.  He had normal coronary arteries prior to valve surgery.  He has undergone previous cardioversion and recently was on flecainide and metoprolol.  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  [place] farm with his [place] locally.  He does not report any sense of palpitations, no exertional chest pain or unusual shortness of breath, generally NYHA class II.  He has had no sudden dizziness or syncope.  He tells me that he had aortic regurgitation necessitating valve replacement as detailed below.  He has done well since that time.  His last echocardiogram was in 2016.  Reports no history of endocarditis.  I went over his medications.  At this time he is not on any AV nodal blockers and likely has some degree of underlying conduction system disease.  He tells me that he did not feel well when he was on metoprolol and flecainide.  Past Medical History:  Diagnosis Date  . Arthritis   . Atrial fibrillation ([place])    Cardioversion 2016  . Atrial flutter ([place])    Cardioversion 2013  . GERD (gastroesophageal reflux disease)   . History of aortic valve replacement with bioprosthetic valve    Aortic regurgitation  . History of chicken pox   . History of  kidney stones   . History of skin cancer   . History of stroke 2011  . Seasonal allergies     Past Surgical History:  Procedure Laterality Date  . AORTIC VALVE REPLACEMENT  2009   Bioprosthetic AVR and root replacement  . BACK SURGERY    . EPIGASTRIC HERNIA REPAIR    . LUMBAR LAMINECTOMY/DECOMPRESSION MICRODISCECTOMY Right 06/01/2017   Procedure: EXTRAFORAMINAL MICRODISCECTOMY LUMBAR FIVE- SACRAL ONE, RIGHT;  Surgeon: Eustace Moore, MD;  Location: [facility];  Service: Neurosurgery;  Laterality: Right;    Current Outpatient Medications  Medication Sig Dispense Refill  . Glucos-MSM-C-Mn-Ginger-Willow (GLUCOSAMINE MSM COMPLEX) TABS Take 2 tablets daily by mouth.     . levothyroxine (SYNTHROID) 50 MCG tablet Take 1 tablet (50 mcg total) by mouth daily before breakfast. 90 tablet 0  . rivaroxaban (XARELTO) 20 MG TABS tablet Take 1 tablet by mouth daily.    . tamsulosin (FLOMAX) 0.4 MG CAPS capsule Take 1 capsule (0.4 mg total) by mouth daily. 90 capsule 0   No current facility-administered medications for this visit.    Allergies:  Patient has no known allergies.   Social History: The patient  reports that  has never smoked. he has never used smokeless tobacco. He reports that he does not drink alcohol or use drugs.   Family History: The patient's family history is not on file.   ROS:  Please see the history of present illness. Otherwise, complete review of systems is positive  for none.  All other systems are reviewed and negative.   Physical Exam: VS:  BP 118/64 (BP Location: Right Arm)   Pulse 79   Ht 5\' 10"  (1.778 m)   Wt 199 lb (90.3 kg)   SpO2 96%   BMI 28.55 kg/m , BMI Body mass index is 28.55 kg/m.  Wt Readings from Last 3 Encounters:  09/08/17 199 lb (90.3 kg)  08/10/17 195 lb (88.5 kg)  06/01/17 191 lb 8 oz (86.9 kg)    General: Patient appears comfortable at rest. HEENT: Conjunctiva and lids normal, oropharynx clear. Neck: Supple, no elevated JVP or carotid bruits,  no thyromegaly. Lungs: Clear to auscultation, nonlabored breathing at rest. Cardiac: Irregularly irregular, no S3, soft systolic murmur, no pericardial rub. Thorax: Well-healed sternal incision. Abdomen: Soft, nontender, bowel sounds present, no guarding or rebound. Extremities: No pitting edema, distal pulses 2+. Skin: Warm and dry. Musculoskeletal: No kyphosis. Neuropsychiatric: Alert and oriented x3, affect grossly appropriate.  ECG: I personally reviewed the tracing from 05/24/2017 which showed atrial flutter with 4:1 block, nonspecific ST changes.  Recent Labwork: 05/24/2017: BUN 23; Creatinine, Ser 1.09; Hemoglobin 14.6; Platelets 166; Potassium 4.0; Sodium 138  Other Studies Reviewed Today:  TEE 06/26/2015 [facility], [place]): TRANSESOPHAGEAL ECHOCARDIOGRAM FINDINGS: Left ventricle is normal size and normal systolic function.Estimated ejection fraction is about 65% with no obvious wall motion abnormalities seen.LV wall thickness is within normal limits.Left atrial size is moderately enlarged.  Right atrial size appears normal.Right ventricular size and function is normal.Atrial septum shows evidence of a PFO with a small right to left atrial shunt by positive bubble study.The mitral valve is a normal appearing valve for age with good excursion and trivial mitral insufficiency.The aortic valve is a well seated bioprosthetic valve with good excursion.No dehiscence.No perivalvular leak.Leaflet excursion is fairly good.Peak velocity through the aortic valve is 1.76m/sec which was obtained post procedure by transthoracic imaging.Tricuspid valve shows trivial tricuspid insufficiency.Pulmonic valve is not seen.The aortic root size is normal, 3.0cm.Pulmonary veins fill normally into left atrium.Left atrial appendage is small, difficult to see, but appears to be free of thrombus.It may have actually been ligated when he had his prior  surgery.Regardless, there is no clot seen within the left atrium or in the left atrial appendage region.  FINAL TRANSESOPHAGEAL ECHOCARDIOGRAM IMPRESSION: 1. No intracardiac thrombus visualized. 2. Moderate left atrial enlargement. 3. Normal left ventricular size and normal systolic function. 4. Trivial mitral insufficiency, trivial tricuspid insufficiency, and normal functioning bioprosthetic aortic valve. 5. Normal size aortic root. 6. 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  Echocardiogram 10/23/2014 [place], [place]): FINAL IMPRESSION: 1.Normal left ventricular size with normal systolic function, estimated left ventricular ejection fraction 60%. 2.Left ventricular wall thickness upper limits of normal. 3.Mild left atrial enlargement. 4.Well seated, normal functioning bioprosthetic aortic valve (#27 Edwards Magna valve/root). 5.Trivial mitral insufficiency and trivial tricuspid insufficiency. 6.Grade I/IV diastolic dysfunction. 7.No pericardial effusion. 8.Normal size aortic root. 9.In comparison to prior echocardiogram from March 05, 2013 there has been no significant change.  Assessment and Plan:  1.  Persistent atrial flutter, asymptomatic and with reasonable heart rate on no specific AV nodal blockers suggesting some degree of underlying conduction system disease.  He is on Xarelto for stroke prophylaxis.  He feels better since being off metoprolol.  We talked about treatment options today, ablation had been discussed with him as a possibility by his cardiologist in [place].  Another option would be simple observation at this time since he is asymptomatic and on  Xarelto for stroke prophylaxis.  I reviewed his most recent ECG.  For now we have decided to continue with observation and plan a follow-up in 6 months presuming no symptoms intervene.  I told him that we could certainly refer him for EP consultation if the situation changes.  2.  History  of aortic regurgitation status post bioprosthetic AVR with root replacement in 2009.  Last echocardiogram was in 2016.  We will obtain an updated baseline study.  3.  Prior history of atrial fibrillation status post cardioversion in 2016.  4.  History of stroke in 2011.  No obvious residual deficits.  Current medicines were reviewed with the patient today.   Orders Placed This Encounter  Procedures  . ECHOCARDIOGRAM COMPLETE    Disposition: Follow-up in 6 months, sooner if needed.  Signed, Satira Sark, MD, [facility] 09/08/2017 9:24 AM    [place] [facility] [place]. [address] [place], [place] [address] Phone: [phone]; Fax: [phone]

## 2017-09-08 ENCOUNTER — Ambulatory Visit (INDEPENDENT_AMBULATORY_CARE_PROVIDER_SITE_OTHER): Payer: 59 | Admitting: Cardiology

## 2017-09-08 ENCOUNTER — Encounter: Payer: Self-pay | Admitting: Cardiology

## 2017-09-08 VITALS — BP 118/64 | HR 79 | Ht 70.0 in | Wt 199.0 lb

## 2017-09-08 DIAGNOSIS — I483 Typical atrial flutter: Secondary | ICD-10-CM

## 2017-09-08 DIAGNOSIS — Z8679 Personal history of other diseases of the circulatory system: Secondary | ICD-10-CM | POA: Diagnosis not present

## 2017-09-08 DIAGNOSIS — Z953 Presence of xenogenic heart valve: Secondary | ICD-10-CM

## 2017-09-08 DIAGNOSIS — Z8673 Personal history of transient ischemic attack (TIA), and cerebral infarction without residual deficits: Secondary | ICD-10-CM | POA: Diagnosis not present

## 2017-09-08 NOTE — Patient Instructions (Signed)
Your physician wants you to follow-up in: 6 months with Dr McDowell You will receive a reminder letter in the mail two months in advance. If you don't receive a letter, please call our office to schedule the follow-up appointment.   Your physician has requested that you have an echocardiogram. Echocardiography is a painless test that uses sound waves to create images of your heart. It provides your doctor with information about the size and shape of your heart and how well your heart's chambers and valves are working. This procedure takes approximately one hour. There are no restrictions for this procedure.    Your physician recommends that you continue on your current medications as directed. Please refer to the Current Medication list given to you today.    If you need a refill on your cardiac medications before your next appointment, please call your pharmacy.     No lab work ordered today     Thank you for choosing [facility] !        

## 2017-09-09 ENCOUNTER — Encounter: Payer: Self-pay | Admitting: Physician Assistant

## 2017-09-09 ENCOUNTER — Ambulatory Visit (INDEPENDENT_AMBULATORY_CARE_PROVIDER_SITE_OTHER): Payer: 59 | Admitting: Physician Assistant

## 2017-09-09 ENCOUNTER — Other Ambulatory Visit: Payer: Self-pay

## 2017-09-09 VITALS — BP 120/60 | Temp 97.9°F | Resp 14 | Ht 69.5 in | Wt 200.0 lb

## 2017-09-09 DIAGNOSIS — E039 Hypothyroidism, unspecified: Secondary | ICD-10-CM | POA: Diagnosis not present

## 2017-09-09 DIAGNOSIS — Z23 Encounter for immunization: Secondary | ICD-10-CM | POA: Diagnosis not present

## 2017-09-09 DIAGNOSIS — Z0000 Encounter for general adult medical examination without abnormal findings: Secondary | ICD-10-CM

## 2017-09-09 LAB — CBC WITH DIFFERENTIAL/PLATELET
BASOS ABS: 0 10*3/uL (ref 0.0–0.1)
Basophils Relative: 0.3 % (ref 0.0–3.0)
EOS ABS: 0 10*3/uL (ref 0.0–0.7)
Eosinophils Relative: 0.2 % (ref 0.0–5.0)
HEMATOCRIT: 43.7 % (ref 39.0–52.0)
HEMOGLOBIN: 15.1 g/dL (ref 13.0–17.0)
LYMPHS PCT: 13.8 % (ref 12.0–46.0)
Lymphs Abs: 1.5 10*3/uL (ref 0.7–4.0)
MCHC: 34.5 g/dL (ref 30.0–36.0)
MCV: 91 fl (ref 78.0–100.0)
MONOS PCT: 5.6 % (ref 3.0–12.0)
Monocytes Absolute: 0.6 10*3/uL (ref 0.1–1.0)
Neutro Abs: 8.6 10*3/uL — ABNORMAL HIGH (ref 1.4–7.7)
Neutrophils Relative %: 80.1 % — ABNORMAL HIGH (ref 43.0–77.0)
Platelets: 170 10*3/uL (ref 150.0–400.0)
RBC: 4.81 Mil/uL (ref 4.22–5.81)
RDW: 13.6 % (ref 11.5–15.5)
WBC: 10.7 10*3/uL — AB (ref 4.0–10.5)

## 2017-09-09 LAB — COMPREHENSIVE METABOLIC PANEL
ALBUMIN: 3.7 g/dL (ref 3.5–5.2)
ALK PHOS: 63 U/L (ref 39–117)
ALT: 16 U/L (ref 0–53)
AST: 13 U/L (ref 0–37)
BILIRUBIN TOTAL: 0.7 mg/dL (ref 0.2–1.2)
BUN: 31 mg/dL — ABNORMAL HIGH (ref 6–23)
CALCIUM: 9.2 mg/dL (ref 8.4–10.5)
CO2: 31 meq/L (ref 19–32)
CREATININE: 1.02 mg/dL (ref 0.40–1.50)
Chloride: 106 mEq/L (ref 96–112)
GFR: 75.08 mL/min (ref >60.00)
Glucose, Bld: 91 mg/dL (ref 70–99)
Potassium: 3.7 mEq/L (ref 3.5–5.1)
Sodium: 144 mEq/L (ref 135–145)
TOTAL PROTEIN: 6.2 g/dL (ref 6.0–8.3)

## 2017-09-09 LAB — HEMOGLOBIN A1C: Hgb A1c MFr Bld: 5.4 % (ref 4.6–6.5)

## 2017-09-09 LAB — LIPID PANEL
Cholesterol: 164 mg/dL (ref 0–200)
HDL: 46.2 mg/dL (ref >39.00)
NonHDL: 117.33
TRIGLYCERIDES: 204 mg/dL — AB (ref 0.0–149.0)
Total CHOL/HDL Ratio: 4
VLDL: 40.8 mg/dL — ABNORMAL HIGH (ref 0.0–40.0)

## 2017-09-09 LAB — TSH: TSH: 1 u[IU]/mL (ref 0.35–4.50)

## 2017-09-09 LAB — LDL CHOLESTEROL, DIRECT: LDL DIRECT: 94 mg/dL

## 2017-09-09 NOTE — Patient Instructions (Signed)
Please go to the lab for blood work.   Our office will call you with your results unless you have chosen to receive results via MyChart.  If your blood work is normal we will follow-up each year for physicals and as scheduled for chronic medical problems.  If anything is abnormal we will treat accordingly and get you in for a follow-up.   Preventive Care 78 Years and Older, Male Preventive care refers to lifestyle choices and visits with your health care provider that can promote health and wellness. What does preventive care include?  A yearly physical exam. This is also called an annual well check.  Dental exams once or twice a year.  Routine eye exams. Ask your health care provider how often you should have your eyes checked.  Personal lifestyle choices, including: ? Daily care of your teeth and gums. ? Regular physical activity. ? Eating a healthy diet. ? Avoiding tobacco and drug use. ? Limiting alcohol use. ? Practicing safe sex. ? Taking low doses of aspirin every day. ? Taking vitamin and mineral supplements as recommended by your health care provider. What happens during an annual well check? The services and screenings done by your health care provider during your annual well check will depend on your age, overall health, lifestyle risk factors, and family history of disease. Counseling Your health care provider may ask you questions about your:  Alcohol use.  Tobacco use.  Drug use.  Emotional well-being.  Home and relationship well-being.  Sexual activity.  Eating habits.  History of falls.  Memory and ability to understand (cognition).  Work and work environment.  Screening You may have the following tests or measurements:  Height, weight, and BMI.  Blood pressure.  Lipid and cholesterol levels. These may be checked every 5 years, or more frequently if you are over 50 years old.  Skin check.  Lung cancer screening. You may have this  screening every year starting at age 55 if you have a 30-pack-year history of smoking and currently smoke or have quit within the past 15 years.  Fecal occult blood test (FOBT) of the stool. You may have this test every year starting at age 50.  Flexible sigmoidoscopy or colonoscopy. You may have a sigmoidoscopy every 5 years or a colonoscopy every 10 years starting at age 50.  Prostate cancer screening. Recommendations will vary depending on your family history and other risks.  Hepatitis C blood test.  Hepatitis B blood test.  Sexually transmitted disease (STD) testing.  Diabetes screening. This is done by checking your blood sugar (glucose) after you have not eaten for a while (fasting). You may have this done every 1-3 years.  Abdominal aortic aneurysm (AAA) screening. You may need this if you are a current or former smoker.  Osteoporosis. You may be screened starting at age 70 if you are at high risk.  Talk with your health care provider about your test results, treatment options, and if necessary, the need for more tests. Vaccines Your health care provider may recommend certain vaccines, such as:  Influenza vaccine. This is recommended every year.  Tetanus, diphtheria, and acellular pertussis (Tdap, Td) vaccine. You may need a Td booster every 10 years.  Varicella vaccine. You may need this if you have not been vaccinated.  Zoster vaccine. You may need this after age 60.  Measles, mumps, and rubella (MMR) vaccine. You may need at least one dose of MMR if you were born in 1957 or later. You   may also need a second dose.  Pneumococcal 13-valent conjugate (PCV13) vaccine. One dose is recommended after age 26.  Pneumococcal polysaccharide (PPSV23) vaccine. One dose is recommended after age 66.  Meningococcal vaccine. You may need this if you have certain conditions.  Hepatitis A vaccine. You may need this if you have certain conditions or if you travel or work in places where  you may be exposed to hepatitis A.  Hepatitis B vaccine. You may need this if you have certain conditions or if you travel or work in places where you may be exposed to hepatitis B.  Haemophilus influenzae type b (Hib) vaccine. You may need this if you have certain risk factors.  Talk to your health care provider about which screenings and vaccines you need and how often you need them. This information is not intended to replace advice given to you by your health care provider. Make sure you discuss any questions you have with your health care provider. Document Released: 07/25/2015 Document Revised: 03/17/2016 Document Reviewed: 04/29/2015 Elsevier Interactive Patient Education  [employer]. .

## 2017-09-09 NOTE — Progress Notes (Signed)
Patient presents to clinic today for annual exam.  Patient is fasting for labs.  Acute Concerns: Denies acute concerns today.   Chronic Issues: Hypothyroidism -- Patient is currently on a regimen of levothyroxine 50 mcg daily. Is taking as directed. Is due for repeat labs today.  Atrial Flutter -- Followed by Cardiology. Is on Xarelto for DVT/PE prophylaxis. Is not currently on BB or CCB due to prior side effects. They have decided to monitor symptoms.   Health Maintenance: Immunizations -- Due for Prevnar. Unsure of Tetanus.  Past Medical History:  Diagnosis Date  . Arthritis   . Atrial fibrillation ([place])    Cardioversion 2016  . Atrial flutter ([place])    Cardioversion 2013  . GERD (gastroesophageal reflux disease)   . History of aortic valve replacement with bioprosthetic valve    Aortic regurgitation  . History of chicken pox   . History of kidney stones   . History of skin cancer   . History of stroke 2011  . Seasonal allergies     Past Surgical History:  Procedure Laterality Date  . AORTIC VALVE REPLACEMENT  2009   Bioprosthetic AVR and root replacement  . BACK SURGERY    . EPIGASTRIC HERNIA REPAIR    . LUMBAR LAMINECTOMY/DECOMPRESSION MICRODISCECTOMY Right 06/01/2017   Procedure: EXTRAFORAMINAL MICRODISCECTOMY LUMBAR FIVE- SACRAL ONE, RIGHT;  Surgeon: Eustace Moore, MD;  Location: [facility];  Service: Neurosurgery;  Laterality: Right;    Current Outpatient Medications on File Prior to Visit  Medication Sig Dispense Refill  . Glucos-MSM-C-Mn-Ginger-Willow (GLUCOSAMINE MSM COMPLEX) TABS Take 2 tablets daily by mouth.     . levothyroxine (SYNTHROID) 50 MCG tablet Take 1 tablet (50 mcg total) by mouth daily before breakfast. 90 tablet 0  . rivaroxaban (XARELTO) 20 MG TABS tablet Take 1 tablet by mouth daily.    . tamsulosin (FLOMAX) 0.4 MG CAPS capsule Take 1 capsule (0.4 mg total) by mouth daily. 90 capsule 0   No current facility-administered medications on file  prior to visit.     No Known Allergies  History reviewed. No pertinent family history.  Social History   Socioeconomic History  . Marital status: Married    Spouse name: Arbie Cookey  . Number of children: Not on file  . Years of education: Not on file  . Highest education level: Not on file  Social Needs  . Financial resource strain: Not on file  . Food insecurity - worry: Not on file  . Food insecurity - inability: Not on file  . Transportation needs - medical: Not on file  . Transportation needs - non-medical: Not on file  Occupational History  . Not on file  Tobacco Use  . Smoking status: Never Smoker  . Smokeless tobacco: Never Used  Substance and Sexual Activity  . Alcohol use: No    Frequency: Never  . Drug use: No  . Sexual activity: Yes  Other Topics Concern  . Not on file  Social History Narrative  . Not on file   Review of Systems  Constitutional: Negative for fever and weight loss.  HENT: Negative for ear discharge, ear pain, hearing loss and tinnitus.   Eyes: Negative for blurred vision, double vision, photophobia and pain.  Respiratory: Negative for cough and shortness of breath.   Cardiovascular: Negative for chest pain and palpitations.  Gastrointestinal: Negative for abdominal pain, blood in stool, constipation, diarrhea, heartburn, melena, nausea and vomiting.  Genitourinary: Negative for dysuria, flank pain, frequency, hematuria and urgency.  Musculoskeletal: Negative for falls.  Neurological: Negative for dizziness, loss of consciousness and headaches.  Endo/Heme/Allergies: Negative for environmental allergies.  Psychiatric/Behavioral: Negative for depression, hallucinations, substance abuse and suicidal ideas. The patient is not nervous/anxious and does not have insomnia.    BP 120/60   Temp 97.9 F (36.6 C) (Oral)   Resp 14   Ht 5' 9.5" (1.765 m)   Wt 200 lb (90.7 kg)   SpO2 97%   BMI 29.11 kg/m   Physical Exam  Constitutional: He is oriented  to person, place, and time and well-developed, well-nourished, and in no distress.  HENT:  Head: Normocephalic and atraumatic.  Right Ear: External ear normal.  Left Ear: External ear normal.  Nose: Nose normal.  Mouth/Throat: Oropharynx is clear and moist. No oropharyngeal exudate.  Eyes: Conjunctivae and EOM are normal. Pupils are equal, round, and reactive to light.  Neck: Neck supple. No thyromegaly present.  Cardiovascular: Normal heart sounds and intact distal pulses.  Chronic irregular rhythm of a fib.  Pulmonary/Chest: Effort normal and breath sounds normal. No respiratory distress. He has no wheezes. He has no rales. He exhibits no tenderness.  Abdominal: Soft. Bowel sounds are normal. He exhibits no distension and no mass. There is no tenderness. There is no rebound and no guarding.  Genitourinary: Testes/scrotum normal.  Lymphadenopathy:    He has no cervical adenopathy.  Neurological: He is alert and oriented to person, place, and time.  Skin: Skin is warm and dry. No rash noted.  Psychiatric: Affect normal.  Vitals reviewed.  Assessment/Plan: Visit for preventive health examination Depression screen negative. Health Maintenance reviewed. Preventive schedule discussed and handout given in AVS. Will obtain fasting labs today.   Acquired hypothyroidism Repeat TSH    Leeanne Rio, PA-C

## 2017-09-12 ENCOUNTER — Other Ambulatory Visit: Payer: Self-pay | Admitting: Emergency Medicine

## 2017-09-12 DIAGNOSIS — Z23 Encounter for immunization: Secondary | ICD-10-CM | POA: Insufficient documentation

## 2017-09-12 DIAGNOSIS — Z01810 Encounter for preprocedural cardiovascular examination: Secondary | ICD-10-CM | POA: Insufficient documentation

## 2017-09-12 DIAGNOSIS — Z0000 Encounter for general adult medical examination without abnormal findings: Secondary | ICD-10-CM | POA: Insufficient documentation

## 2017-09-12 DIAGNOSIS — D72828 Other elevated white blood cell count: Secondary | ICD-10-CM

## 2017-09-12 NOTE — Assessment & Plan Note (Signed)
Depression screen negative. Health Maintenance reviewed. Preventive schedule discussed and handout given in AVS. Will obtain fasting labs today.  

## 2017-09-12 NOTE — Assessment & Plan Note (Signed)
Repeat TSH

## 2017-09-13 ENCOUNTER — Ambulatory Visit (HOSPITAL_COMMUNITY)
Admission: RE | Admit: 2017-09-13 | Discharge: 2017-09-13 | Disposition: A | Discharge status: Home / Self Care | Payer: 59 | Source: Ambulatory Visit | Attending: Cardiology | Admitting: Cardiology

## 2017-09-13 DIAGNOSIS — I4891 Unspecified atrial fibrillation: Secondary | ICD-10-CM | POA: Diagnosis not present

## 2017-09-13 DIAGNOSIS — Z953 Presence of xenogenic heart valve: Secondary | ICD-10-CM | POA: Diagnosis not present

## 2017-09-13 DIAGNOSIS — I4892 Unspecified atrial flutter: Secondary | ICD-10-CM | POA: Diagnosis not present

## 2017-09-13 DIAGNOSIS — K219 Gastro-esophageal reflux disease without esophagitis: Secondary | ICD-10-CM | POA: Diagnosis not present

## 2017-09-13 DIAGNOSIS — I480 Paroxysmal atrial fibrillation: Secondary | ICD-10-CM | POA: Insufficient documentation

## 2017-09-13 DIAGNOSIS — I119 Hypertensive heart disease without heart failure: Secondary | ICD-10-CM | POA: Insufficient documentation

## 2017-09-13 DIAGNOSIS — Z8673 Personal history of transient ischemic attack (TIA), and cerebral infarction without residual deficits: Secondary | ICD-10-CM | POA: Diagnosis not present

## 2017-09-13 DIAGNOSIS — I359 Nonrheumatic aortic valve disorder, unspecified: Secondary | ICD-10-CM | POA: Diagnosis present

## 2017-09-13 NOTE — Progress Notes (Signed)
*  PRELIMINARY RESULTS* Echocardiogram 2D Echocardiogram has been performed.  Samuel Germany 09/13/2017, 11:25 AM

## 2017-10-10 ENCOUNTER — Other Ambulatory Visit

## 2017-10-13 ENCOUNTER — Other Ambulatory Visit (INDEPENDENT_AMBULATORY_CARE_PROVIDER_SITE_OTHER)

## 2017-10-13 DIAGNOSIS — D72828 Other elevated white blood cell count: Secondary | ICD-10-CM | POA: Diagnosis not present

## 2017-10-13 LAB — CBC WITH DIFFERENTIAL/PLATELET
BASOS PCT: 0.4 % (ref 0.0–3.0)
Basophils Absolute: 0 10*3/uL (ref 0.0–0.1)
EOS ABS: 0.1 10*3/uL (ref 0.0–0.7)
EOS PCT: 0.6 % (ref 0.0–5.0)
HCT: 42.8 % (ref 39.0–52.0)
Hemoglobin: 14.8 g/dL (ref 13.0–17.0)
LYMPHS ABS: 1.3 10*3/uL (ref 0.7–4.0)
Lymphocytes Relative: 15.2 % (ref 12.0–46.0)
MCHC: 34.5 g/dL (ref 30.0–36.0)
MCV: 91.6 fl (ref 78.0–100.0)
MONO ABS: 0.8 10*3/uL (ref 0.1–1.0)
Monocytes Relative: 9.4 % (ref 3.0–12.0)
NEUTROS ABS: 6.1 10*3/uL (ref 1.4–7.7)
NEUTROS PCT: 74.4 % (ref 43.0–77.0)
PLATELETS: 167 10*3/uL (ref 150.0–400.0)
RBC: 4.67 Mil/uL (ref 4.22–5.81)
RDW: 13.8 % (ref 11.5–15.5)
WBC: 8.3 10*3/uL (ref 4.0–10.5)

## 2017-11-04 ENCOUNTER — Other Ambulatory Visit: Payer: Self-pay | Admitting: Physician Assistant

## 2017-11-04 MED ORDER — LEVOTHYROXINE SODIUM 50 MCG PO TABS: 50.0000 ug | ORAL_TABLET | Freq: Every day | ORAL | 0 refills | Status: DC

## 2017-11-04 MED ORDER — TAMSULOSIN HCL 0.4 MG PO CAPS: 0.4000 mg | ORAL_CAPSULE | Freq: Every day | ORAL | 0 refills | Status: DC

## 2017-11-04 MED FILL — TAMSULOSIN HCL 0.4 MG CAP: 0.4 | 90 days supply | Qty: 90 | Fill #0

## 2017-11-04 MED FILL — LEVOTHYROXINE 50 MCG TABLET: 50 | 90 days supply | Qty: 90 | Fill #0

## 2018-02-08 ENCOUNTER — Other Ambulatory Visit: Payer: Self-pay | Admitting: Physician Assistant

## 2018-02-08 ENCOUNTER — Encounter: Payer: Self-pay | Admitting: Physician Assistant

## 2018-02-09 MED ORDER — TAMSULOSIN HCL 0.4 MG PO CAPS: 0.4000 mg | ORAL_CAPSULE | Freq: Every day | ORAL | 0 refills | Status: DC

## 2018-02-09 MED ORDER — LEVOTHYROXINE SODIUM 50 MCG PO TABS: 50.0000 ug | ORAL_TABLET | Freq: Every day | ORAL | 0 refills | Status: DC

## 2018-02-09 MED FILL — TAMSULOSIN HCL 0.4 MG CAP: 0.4 | 90 days supply | Qty: 90 | Fill #0

## 2018-02-21 MED FILL — LEVOTHYROXINE 50 MCG TABLET: 50 | 90 days supply | Qty: 90 | Fill #0

## 2018-02-22 DIAGNOSIS — I481 Persistent atrial fibrillation: Secondary | ICD-10-CM | POA: Diagnosis not present

## 2018-02-22 DIAGNOSIS — I4892 Unspecified atrial flutter: Secondary | ICD-10-CM | POA: Diagnosis not present

## 2018-02-22 DIAGNOSIS — I10 Essential (primary) hypertension: Secondary | ICD-10-CM | POA: Diagnosis not present

## 2018-02-22 DIAGNOSIS — R9431 Abnormal electrocardiogram [ECG] [EKG]: Secondary | ICD-10-CM | POA: Diagnosis not present

## 2018-02-22 DIAGNOSIS — Z952 Presence of prosthetic heart valve: Secondary | ICD-10-CM | POA: Diagnosis not present

## 2018-03-23 MED FILL — ELIQUIS 5 MG TABLET: 5 | 30 days supply | Qty: 60 | Fill #0

## 2018-03-28 DIAGNOSIS — I481 Persistent atrial fibrillation: Secondary | ICD-10-CM | POA: Diagnosis not present

## 2018-03-28 DIAGNOSIS — I4892 Unspecified atrial flutter: Secondary | ICD-10-CM | POA: Diagnosis not present

## 2018-03-28 DIAGNOSIS — I10 Essential (primary) hypertension: Secondary | ICD-10-CM | POA: Diagnosis not present

## 2018-03-28 DIAGNOSIS — Z952 Presence of prosthetic heart valve: Secondary | ICD-10-CM | POA: Diagnosis not present

## 2018-04-10 MED FILL — METOPROLOL TARTRATE 25 MG T: 25 | 90 days supply | Qty: 180 | Fill #0

## 2018-04-14 ENCOUNTER — Encounter: Payer: Self-pay | Admitting: Physician Assistant

## 2018-04-14 ENCOUNTER — Other Ambulatory Visit: Payer: Self-pay

## 2018-04-14 ENCOUNTER — Ambulatory Visit: Payer: 59 | Admitting: Physician Assistant

## 2018-04-14 VITALS — BP 100/70 | HR 73 | Temp 98.2°F | Resp 16 | Ht 69.5 in | Wt 199.0 lb

## 2018-04-14 DIAGNOSIS — J069 Acute upper respiratory infection, unspecified: Secondary | ICD-10-CM

## 2018-04-14 DIAGNOSIS — B9789 Other viral agents as the cause of diseases classified elsewhere: Secondary | ICD-10-CM | POA: Diagnosis not present

## 2018-04-14 MED ORDER — BENZONATATE 100 MG PO CAPS: 100.0000 mg | ORAL_CAPSULE | Freq: Three times a day (TID) | ORAL | 0 refills | Status: DC | PRN

## 2018-04-14 MED FILL — BENZONATATE 100 MG CAP: 100 | 10 days supply | Qty: 30 | Fill #0

## 2018-04-14 NOTE — Patient Instructions (Signed)
Please increase fluids and get plenty of rest. Start an over-the-counter plain Mucinex to thin out phlegm Start the [facility] as directed for cough.  If symptoms are not continuing to improve/resolve over the weekend, please call me and we will consider addition of antibiotics.  Viral Respiratory Infection A viral respiratory infection is an illness that affects parts of the body used for breathing, like the lungs, nose, and throat. It is caused by a germ called a virus. Some examples of this kind of infection are:  A cold.  The flu (influenza).  A respiratory syncytial virus (RSV) infection.  How do I know if I have this infection? Most of the time this infection causes:  A stuffy or runny nose.  Yellow or green fluid in the nose.  A cough.  Sneezing.  Tiredness (fatigue).  Achy muscles.  A sore throat.  Sweating or chills.  A fever.  A headache.  How is this infection treated? If the flu is diagnosed early, it may be treated with an antiviral medicine. This medicine shortens the length of time a person has symptoms. Symptoms may be treated with over-the-counter and prescription medicines, such as:  Expectorants. These make it easier to cough up mucus.  Decongestant nasal sprays.  Doctors do not prescribe antibiotic medicines for viral infections. They do not work with this kind of infection. How do I know if I should stay home? To keep others from getting sick, stay home if you have:  A fever.  A lasting cough.  A sore throat.  A runny nose.  Sneezing.  Muscles aches.  Headaches.  Tiredness.  Weakness.  Chills.  Sweating.  An upset stomach (nausea).  Follow these instructions at home:  Rest as much as possible.  Take over-the-counter and prescription medicines only as told by your doctor.  Drink enough fluid to keep your pee (urine) clear or pale yellow.  Gargle with salt water. Do this 3-4 times per day or as needed. To make a  salt-water mixture, dissolve -1 tsp of salt in 1 cup of warm water. Make sure the salt dissolves all the way.  Use nose drops made from salt water. This helps with stuffiness (congestion). It also helps soften the skin around your nose.  Do not drink alcohol.  Do not use tobacco products, including cigarettes, chewing tobacco, and e-cigarettes. If you need help quitting, ask your doctor. Get help if:  Your symptoms last for 10 days or longer.  Your symptoms get worse over time.  You have a fever.  You have very bad pain in your face or forehead.  Parts of your jaw or neck become very swollen. Get help right away if:  You feel pain or pressure in your chest.  You have shortness of breath.  You faint or feel like you will faint.  You keep throwing up (vomiting).  You feel confused. This information is not intended to replace advice given to you by your health care provider. Make sure you discuss any questions you have with your health care provider. Document Released: 06/10/2008 Document Revised: 12/04/2015 Document Reviewed: 12/04/2014 Elsevier Interactive Patient Education  2018 [employer].

## 2018-04-14 NOTE — Progress Notes (Signed)
Patient presents to clinic today c/o chest congestion and cough that is sometimes productive of a green sputum. Symptoms started 2 days ago. Denies sinus pressure, pain, ear pain, tooth pain. Denies chest pain or SOB. Has not taken anything for symptoms. Just got in from [place] last week -- flew on plane. Notes cough is much better today than yesterday.   Past Medical History:  Diagnosis Date  . Arthritis   . Atrial fibrillation ([place])    Cardioversion 2016  . Atrial flutter ([place])    Cardioversion 2013  . GERD (gastroesophageal reflux disease)   . History of aortic valve replacement with bioprosthetic valve    Aortic regurgitation  . History of chicken pox   . History of kidney stones   . History of skin cancer   . History of stroke 2011  . Seasonal allergies     Current Outpatient Medications on File Prior to Visit  Medication Sig Dispense Refill  . Glucos-MSM-C-Mn-Ginger-Willow (GLUCOSAMINE MSM COMPLEX) TABS Take 2 tablets daily by mouth.     . levothyroxine (SYNTHROID) 50 MCG tablet Take 1 tablet (50 mcg total) by mouth daily before breakfast. 90 tablet 0  . metoprolol tartrate (LOPRESSOR) 25 MG tablet Take 25 mg by mouth 2 (two) times daily.    . rivaroxaban (XARELTO) 20 MG TABS tablet Take 1 tablet by mouth daily.    . tamsulosin (FLOMAX) 0.4 MG CAPS capsule Take 1 capsule (0.4 mg total) by mouth daily. 90 capsule 0   No current facility-administered medications on file prior to visit.     No Known Allergies  History reviewed. No pertinent family history.  Social History   Socioeconomic History  . Marital status: Married    Spouse name: Arbie Cookey  . Number of children: Not on file  . Years of education: Not on file  . Highest education level: Not on file  Occupational History  . Not on file  Social Needs  . Financial resource strain: Not on file  . Food insecurity:    Worry: Not on file    Inability: Not on file  . Transportation needs:    Medical: Not on file   Non-medical: Not on file  Tobacco Use  . Smoking status: Never Smoker  . Smokeless tobacco: Never Used  Substance and Sexual Activity  . Alcohol use: No    Frequency: Never  . Drug use: No  . Sexual activity: Yes  Lifestyle  . Physical activity:    Days per week: Not on file    Minutes per session: Not on file  . Stress: Not on file  Relationships  . Social connections:    Talks on phone: Not on file    Gets together: Not on file    Attends religious service: Not on file    Active member of club or organization: Not on file    Attends meetings of clubs or organizations: Not on file    Relationship status: Not on file  Other Topics Concern  . Not on file  Social History Narrative  . Not on file   Review of Systems - See HPI.  All other ROS are negative.  BP 100/70   Pulse 73   Temp 98.2 F (36.8 C) (Oral)   Resp 16   Ht 5' 9.5" (1.765 m)   Wt 199 lb (90.3 kg)   SpO2 97%   BMI 28.97 kg/m   Physical Exam  Constitutional: He is oriented to person, place, and time. He  appears well-developed and well-nourished.  HENT:  Head: Normocephalic and atraumatic.  Right Ear: External ear normal.  Left Ear: External ear normal.  Neck: Neck supple.  Cardiovascular: Normal rate, regular rhythm, normal heart sounds and intact distal pulses.  Pulmonary/Chest: Effort normal and breath sounds normal. No stridor. No respiratory distress. He has no wheezes. He has no rales. He exhibits no tenderness.  Neurological: He is alert and oriented to person, place, and time.  Psychiatric: He has a normal mood and affect.  Vitals reviewed.  Assessment/Plan: 1. Viral URI with cough Supportive measures reviewed. Start plain Mucinex and saline nasal rinse. Rx Tessalon as directed. Rest recommended. Follow-up if not continuing to improve. - benzonatate (TESSALON) 100 MG capsule; Take 1 capsule (100 mg total) by mouth 3 (three) times daily as needed for cough.  Dispense: 30 capsule; Refill:  0   Leeanne Rio, PA-C

## 2018-05-02 DIAGNOSIS — M545 Low back pain: Secondary | ICD-10-CM | POA: Diagnosis not present

## 2018-05-02 MED FILL — METHYLPREDNISOLONE 4 MG TAB: 4 | 6 days supply | Qty: 21 | Fill #0

## 2018-05-11 ENCOUNTER — Other Ambulatory Visit: Payer: Self-pay | Admitting: Physician Assistant

## 2018-05-12 MED ORDER — TAMSULOSIN HCL 0.4 MG PO CAPS: 0.4000 mg | ORAL_CAPSULE | Freq: Every day | ORAL | 0 refills | Status: DC

## 2018-05-12 MED ORDER — LEVOTHYROXINE SODIUM 50 MCG PO TABS: 50.0000 ug | ORAL_TABLET | Freq: Every day | ORAL | 0 refills | Status: DC

## 2018-05-12 MED FILL — TAMSULOSIN HCL 0.4 MG CAP: 0.4 | 90 days supply | Qty: 90 | Fill #0

## 2018-05-12 MED FILL — LEVOTHYROXINE 50 MCG TABLET: 50 | 90 days supply | Qty: 90 | Fill #0

## 2018-05-15 DIAGNOSIS — M545 Low back pain: Secondary | ICD-10-CM | POA: Diagnosis not present

## 2018-05-18 DIAGNOSIS — M545 Low back pain: Secondary | ICD-10-CM | POA: Diagnosis not present

## 2018-05-30 DIAGNOSIS — Z6828 Body mass index (BMI) 28.0-28.9, adult: Secondary | ICD-10-CM | POA: Diagnosis not present

## 2018-05-30 DIAGNOSIS — M545 Low back pain: Secondary | ICD-10-CM | POA: Diagnosis not present

## 2018-05-30 DIAGNOSIS — R030 Elevated blood-pressure reading, without diagnosis of hypertension: Secondary | ICD-10-CM | POA: Diagnosis not present

## 2018-06-06 ENCOUNTER — Encounter: Payer: Self-pay | Admitting: Physician Assistant

## 2018-06-06 ENCOUNTER — Other Ambulatory Visit: Payer: Self-pay

## 2018-06-06 ENCOUNTER — Ambulatory Visit (INDEPENDENT_AMBULATORY_CARE_PROVIDER_SITE_OTHER): Payer: 59 | Admitting: Physician Assistant

## 2018-06-06 VITALS — BP 148/98 | HR 84 | Temp 98.4°F | Resp 14 | Ht 69.5 in | Wt 201.0 lb

## 2018-06-06 DIAGNOSIS — R609 Edema, unspecified: Secondary | ICD-10-CM

## 2018-06-06 DIAGNOSIS — Z23 Encounter for immunization: Secondary | ICD-10-CM | POA: Diagnosis not present

## 2018-06-06 LAB — COMPREHENSIVE METABOLIC PANEL
ALT: 24 U/L (ref 0–53)
AST: 18 U/L (ref 0–37)
Albumin: 3.9 g/dL (ref 3.5–5.2)
Alkaline Phosphatase: 48 U/L (ref 39–117)
BILIRUBIN TOTAL: 0.7 mg/dL (ref 0.2–1.2)
BUN: 30 mg/dL — ABNORMAL HIGH (ref 6–23)
CO2: 28 meq/L (ref 19–32)
Calcium: 9 mg/dL (ref 8.4–10.5)
Chloride: 106 mEq/L (ref 96–112)
Creatinine, Ser: 0.95 mg/dL (ref 0.40–1.50)
GFR: 81.35 mL/min (ref >60.00)
GLUCOSE: 98 mg/dL (ref 70–99)
Potassium: 3.6 mEq/L (ref 3.5–5.1)
Sodium: 143 mEq/L (ref 135–145)
Total Protein: 5.9 g/dL — ABNORMAL LOW (ref 6.0–8.3)

## 2018-06-06 LAB — BRAIN NATRIURETIC PEPTIDE: Pro B Natriuretic peptide (BNP): 66 pg/mL (ref 0.0–100.0)

## 2018-06-06 MED ORDER — FUROSEMIDE 20 MG PO TABS: 20.0000 mg | ORAL_TABLET | Freq: Every day | ORAL | 1 refills | Status: DC

## 2018-06-06 MED FILL — FUROSEMIDE 20 MG TABS: 20 | 15 days supply | Qty: 15 | Fill #0

## 2018-06-06 NOTE — Progress Notes (Signed)
Patient presents to clinic today c/o 3 weeks of intermittent bilateral leg swelling.  Denies change to diet but notes a diet very heavy in salt. Is hydrating. Denies chest pain, palpitations, SOB, PND or orthopnea. Had recent Cardiology follow-up in [place] and noted having an echocardiogram which he was told is normal. Denies other symptoms presently.    Past Medical History:  Diagnosis Date  . Arthritis   . Atrial fibrillation ([place])    Cardioversion 2016  . Atrial flutter ([place])    Cardioversion 2013  . GERD (gastroesophageal reflux disease)   . History of aortic valve replacement with bioprosthetic valve    Aortic regurgitation  . History of chicken pox   . History of kidney stones   . History of skin cancer   . History of stroke 2011  . Seasonal allergies     Current Outpatient Medications on File Prior to Visit  Medication Sig Dispense Refill  . dexamethasone (DECADRON) 4 MG tablet Take 4 mg by mouth daily.    Donnie Aho (GLUCOSAMINE MSM COMPLEX) TABS Take 2 tablets daily by mouth.     . levothyroxine (SYNTHROID) 50 MCG tablet Take 1 tablet (50 mcg total) by mouth daily before breakfast. 90 tablet 0  . metoprolol tartrate (LOPRESSOR) 25 MG tablet Take 12.5 mg by mouth 2 (two) times daily.     . rivaroxaban (XARELTO) 20 MG TABS tablet Take 1 tablet by mouth daily.    . tamsulosin (FLOMAX) 0.4 MG CAPS capsule Take 1 capsule (0.4 mg total) by mouth daily. 90 capsule 0   No current facility-administered medications on file prior to visit.     No Known Allergies  History reviewed. No pertinent family history.  Social History   Socioeconomic History  . Marital status: Married    Spouse name: Arbie Cookey  . Number of children: Not on file  . Years of education: Not on file  . Highest education level: Not on file  Occupational History  . Not on file  Social Needs  . Financial resource strain: Not on file  . Food insecurity:    Worry: Not on file   Inability: Not on file  . Transportation needs:    Medical: Not on file    Non-medical: Not on file  Tobacco Use  . Smoking status: Never Smoker  . Smokeless tobacco: Never Used  Substance and Sexual Activity  . Alcohol use: No    Frequency: Never  . Drug use: No  . Sexual activity: Yes  Lifestyle  . Physical activity:    Days per week: Not on file    Minutes per session: Not on file  . Stress: Not on file  Relationships  . Social connections:    Talks on phone: Not on file    Gets together: Not on file    Attends religious service: Not on file    Active member of club or organization: Not on file    Attends meetings of clubs or organizations: Not on file    Relationship status: Not on file  Other Topics Concern  . Not on file  Social History Narrative  . Not on file   Review of Systems - See HPI.  All other ROS are negative.  BP (!) 148/98   Pulse 84   Temp 98.4 F (36.9 C) (Oral)   Resp 14   Ht 5' 9.5" (1.765 m)   Wt 201 lb (91.2 kg)   SpO2 97%   BMI 29.26 kg/m  Physical Exam  Constitutional: He appears well-developed and well-nourished.  HENT:  Head: Normocephalic and atraumatic.  Eyes: Conjunctivae are normal.  Neck: Neck supple.  Cardiovascular: Normal rate, regular rhythm, normal heart sounds and intact distal pulses.  Pulses:      Dorsalis pedis pulses are 2+ on the right side, and 2+ on the left side.       Posterior tibial pulses are 2+ on the right side, and 2+ on the left side.  2+ pitting edema of bilateral lower extremities   Pulmonary/Chest: Effort normal and breath sounds normal. No stridor. No respiratory distress. He has no wheezes. He has no rales. He exhibits no tenderness.  Vitals reviewed.  Assessment/Plan: 1. Peripheral edema Notes significant salt intake. Will start DASH diet. Lungs CTAB. Heart NSR today. Will check BNP and CMP today to further assess. Start Lasix 20 mg daily. Close follow-up scheduled.   - B Nat Peptide - Comp Met  (CMET) - furosemide (LASIX) 20 MG tablet; Take 1 tablet (20 mg total) by mouth daily.  Dispense: 15 tablet; Refill: 1  2. Encounter for immunization - Flu vaccine HIGH DOSE PF   Leeanne Rio, PA-C

## 2018-06-06 NOTE — Patient Instructions (Addendum)
Please go to the lab today for blood work.  I will call you with your results. We will alter treatment regimen(s) if indicated by your results.   Cut back on the salt!!!! See the diet below.  Start the Lasix once daily while we are looking into potential causes.  We will schedule follow-up when I call you with results.    DASH Eating Plan DASH stands for "Dietary Approaches to Stop Hypertension." The DASH eating plan is a healthy eating plan that has been shown to reduce high blood pressure (hypertension). It may also reduce your risk for type 2 diabetes, heart disease, and stroke. The DASH eating plan may also help with weight loss. What are tips for following this plan? General guidelines  Avoid eating more than 2,300 mg (milligrams) of salt (sodium) a day. If you have hypertension, you may need to reduce your sodium intake to 1,500 mg a day.  Limit alcohol intake to no more than 1 drink a day for nonpregnant women and 2 drinks a day for men. One drink equals 12 oz of beer, 5 oz of wine, or 1 oz of hard liquor.  Work with your health care provider to maintain a healthy body weight or to lose weight. Ask what an ideal weight is for you.  Get at least 30 minutes of exercise that causes your heart to beat faster (aerobic exercise) most days of the week. Activities may include walking, swimming, or biking.  Work with your health care provider or diet and nutrition specialist (dietitian) to adjust your eating plan to your individual calorie needs. Reading food labels  Check food labels for the amount of sodium per serving. Choose foods with less than 5 percent of the Daily Value of sodium. Generally, foods with less than 300 mg of sodium per serving fit into this eating plan.  To find whole grains, look for the word "whole" as the first word in the ingredient list. Shopping  Buy products labeled as "low-sodium" or "no salt added."  Buy fresh foods. Avoid canned foods and premade or  frozen meals. Cooking  Avoid adding salt when cooking. Use salt-free seasonings or herbs instead of table salt or sea salt. Check with your health care provider or pharmacist before using salt substitutes.  Do not fry foods. Cook foods using healthy methods such as baking, boiling, grilling, and broiling instead.  Cook with heart-healthy oils, such as olive, canola, soybean, or sunflower oil. Meal planning   Eat a balanced diet that includes: ? 5 or more servings of fruits and vegetables each day. At each meal, try to fill half of your plate with fruits and vegetables. ? Up to 6-8 servings of whole grains each day. ? Less than 6 oz of lean meat, poultry, or fish each day. A 3-oz serving of meat is about the same size as a deck of cards. One egg equals 1 oz. ? 2 servings of low-fat dairy each day. ? A serving of nuts, seeds, or beans 5 times each week. ? Heart-healthy fats. Healthy fats called Omega-3 fatty acids are found in foods such as flaxseeds and coldwater fish, like sardines, salmon, and mackerel.  Limit how much you eat of the following: ? Canned or prepackaged foods. ? Food that is high in trans fat, such as fried foods. ? Food that is high in saturated fat, such as fatty meat. ? Sweets, desserts, sugary drinks, and other foods with added sugar. ? Full-fat dairy products.  Do not salt  foods before eating.  Try to eat at least 2 vegetarian meals each week.  Eat more home-cooked food and less restaurant, buffet, and fast food.  When eating at a restaurant, ask that your food be prepared with less salt or no salt, if possible. What foods are recommended? The items listed may not be a complete list. Talk with your dietitian about what dietary choices are best for you. Grains Whole-grain or whole-wheat bread. Whole-grain or whole-wheat pasta. Brown rice. [place]. Bulgur. Whole-grain and low-sodium cereals. Pita bread. Low-fat, low-sodium crackers. Whole-wheat flour  tortillas. Vegetables Fresh or frozen vegetables (raw, steamed, roasted, or grilled). Low-sodium or reduced-sodium tomato and vegetable juice. Low-sodium or reduced-sodium tomato sauce and tomato paste. Low-sodium or reduced-sodium canned vegetables. Fruits All fresh, dried, or frozen fruit. Canned fruit in natural juice (without added sugar). Meat and other protein foods Skinless chicken or [place]. Ground chicken or [place]. Pork with fat trimmed off. Fish and seafood. Egg whites. Dried beans, peas, or lentils. Unsalted nuts, nut butters, and seeds. Unsalted canned beans. Lean cuts of beef with fat trimmed off. Low-sodium, lean deli meat. Dairy Low-fat (1%) or fat-free (skim) milk. Fat-free, low-fat, or reduced-fat cheeses. Nonfat, low-sodium ricotta or cottage cheese. Low-fat or nonfat yogurt. Low-fat, low-sodium cheese. Fats and oils Soft margarine without trans fats. Vegetable oil. Low-fat, reduced-fat, or light mayonnaise and salad dressings (reduced-sodium). Canola, safflower, olive, soybean, and sunflower oils. Avocado. Seasoning and other foods Herbs. Spices. Seasoning mixes without salt. Unsalted popcorn and pretzels. Fat-free sweets. What foods are not recommended? The items listed may not be a complete list. Talk with your dietitian about what dietary choices are best for you. Grains Baked goods made with fat, such as croissants, muffins, or some breads. Dry pasta or rice meal packs. Vegetables Creamed or fried vegetables. Vegetables in a cheese sauce. Regular canned vegetables (not low-sodium or reduced-sodium). Regular canned tomato sauce and paste (not low-sodium or reduced-sodium). Regular tomato and vegetable juice (not low-sodium or reduced-sodium). Angie Fava. Olives. Fruits Canned fruit in a light or heavy syrup. Fried fruit. Fruit in cream or butter sauce. Meat and other protein foods Fatty cuts of meat. Ribs. Fried meat. Berniece Salines. Sausage. Bologna and other processed lunch meats.  Salami. Fatback. Hotdogs. Bratwurst. Salted nuts and seeds. Canned beans with added salt. Canned or smoked fish. Whole eggs or egg yolks. Chicken or [place] with skin. Dairy Whole or 2% milk, cream, and half-and-half. Whole or full-fat cream cheese. Whole-fat or sweetened yogurt. Full-fat cheese. Nondairy creamers. Whipped toppings. Processed cheese and cheese spreads. Fats and oils Butter. Stick margarine. Lard. Shortening. Ghee. Bacon fat. Tropical oils, such as coconut, palm kernel, or palm oil. Seasoning and other foods Salted popcorn and pretzels. Onion salt, garlic salt, seasoned salt, table salt, and sea salt. Worcestershire sauce. Tartar sauce. Barbecue sauce. Teriyaki sauce. Soy sauce, including reduced-sodium. Steak sauce. Canned and packaged gravies. Fish sauce. Oyster sauce. Cocktail sauce. Horseradish that you find on the shelf. Ketchup. Mustard. Meat flavorings and tenderizers. Bouillon cubes. Hot sauce and Tabasco sauce. Premade or packaged marinades. Premade or packaged taco seasonings. Relishes. Regular salad dressings. Where to find more information:  National Heart, Lung, and [place]: https://wilson-eaton.com/  American Heart Association: www.heart.org Summary  The DASH eating plan is a healthy eating plan that has been shown to reduce high blood pressure (hypertension). It may also reduce your risk for type 2 diabetes, heart disease, and stroke.  With the DASH eating plan, you should limit salt (sodium) intake to 2,300  mg a day. If you have hypertension, you may need to reduce your sodium intake to 1,500 mg a day.  When on the DASH eating plan, aim to eat more fresh fruits and vegetables, whole grains, lean proteins, low-fat dairy, and heart-healthy fats.  Work with your health care provider or diet and nutrition specialist (dietitian) to adjust your eating plan to your individual calorie needs. This information is not intended to replace advice given to you by your health  care provider. Make sure you discuss any questions you have with your health care provider. Document Released: 06/17/2011 Document Revised: 06/21/2016 Document Reviewed: 06/21/2016 Elsevier Interactive Patient Education  [employer].

## 2018-06-12 ENCOUNTER — Ambulatory Visit (HOSPITAL_BASED_OUTPATIENT_CLINIC_OR_DEPARTMENT_OTHER)
Admission: RE | Admit: 2018-06-12 | Discharge: 2018-06-12 | Disposition: A | Discharge status: Home / Self Care | Payer: 59 | Source: Ambulatory Visit | Attending: Physician Assistant | Admitting: Physician Assistant

## 2018-06-12 ENCOUNTER — Other Ambulatory Visit: Payer: Self-pay | Admitting: Physician Assistant

## 2018-06-12 ENCOUNTER — Other Ambulatory Visit: Payer: Self-pay

## 2018-06-12 ENCOUNTER — Encounter: Payer: Self-pay | Admitting: Physician Assistant

## 2018-06-12 ENCOUNTER — Ambulatory Visit (INDEPENDENT_AMBULATORY_CARE_PROVIDER_SITE_OTHER): Payer: 59 | Admitting: Physician Assistant

## 2018-06-12 VITALS — BP 118/80 | HR 73 | Temp 97.7°F | Resp 14 | Ht 69.5 in | Wt 200.0 lb

## 2018-06-12 DIAGNOSIS — M7989 Other specified soft tissue disorders: Secondary | ICD-10-CM | POA: Diagnosis not present

## 2018-06-12 DIAGNOSIS — M7122 Synovial cyst of popliteal space [Baker], left knee: Secondary | ICD-10-CM | POA: Insufficient documentation

## 2018-06-12 DIAGNOSIS — R609 Edema, unspecified: Secondary | ICD-10-CM | POA: Diagnosis not present

## 2018-06-12 DIAGNOSIS — E876 Hypokalemia: Secondary | ICD-10-CM

## 2018-06-12 LAB — BASIC METABOLIC PANEL
BUN: 37 mg/dL — ABNORMAL HIGH (ref 6–23)
CO2: 28 mEq/L (ref 19–32)
Calcium: 8.8 mg/dL (ref 8.4–10.5)
Chloride: 102 mEq/L (ref 96–112)
Creatinine, Ser: 1.22 mg/dL (ref 0.40–1.50)
GFR: 60.95 mL/min (ref >60.00)
Glucose, Bld: 139 mg/dL — ABNORMAL HIGH (ref 70–99)
POTASSIUM: 3.1 meq/L — AB (ref 3.5–5.1)
SODIUM: 141 meq/L (ref 135–145)

## 2018-06-12 MED ORDER — POTASSIUM CHLORIDE CRYS ER 20 MEQ PO TBCR: EXTENDED_RELEASE_TABLET | ORAL | 3 refills | Status: DC

## 2018-06-12 MED FILL — POTASSIUM CHLORIDE CRYS ER: 20 | 28 days supply | Qty: 30 | Fill #0

## 2018-06-12 NOTE — Patient Instructions (Signed)
Please go to the lab today for blood work.  I will call you with your results. We will alter treatment regimen(s) if indicated by your results.   Please go to the front desk and speak with Levada Dy regarding your [place].  Continue the Lasix. Continue limiting salt intake. Elevate legs while resting.  We will call with [place] results and alter treatment if needed.

## 2018-06-12 NOTE — Progress Notes (Signed)
Patient presents to clinic today for follow-up of peripheral edema. At last visit lab assessment was performed revealing mildly elevated BUN but normal creatinine, liver function, protein, BNP, and electrolyte levels. Was started on Lasix 20 mg daily and instructed to increase potassium intake. Was also instructed to cut back on his significant sodium intake and elevate legs when seated. Endorses following instructions with significant improvement. Notes he still has some minimal swelling in legs with L >> R. Denies calf pain or tenderness. Is taking his Xarelto for a. Fib as directed.    Past Medical History:  Diagnosis Date  . Arthritis   . Atrial fibrillation ([place])    Cardioversion 2016  . Atrial flutter ([place])    Cardioversion 2013  . GERD (gastroesophageal reflux disease)   . History of aortic valve replacement with bioprosthetic valve    Aortic regurgitation  . History of chicken pox   . History of kidney stones   . History of skin cancer   . History of stroke 2011  . Seasonal allergies     Current Outpatient Medications on File Prior to Visit  Medication Sig Dispense Refill  . dexamethasone (DECADRON) 4 MG tablet Take 4 mg by mouth daily.    . furosemide (LASIX) 20 MG tablet Take 1 tablet (20 mg total) by mouth daily. 15 tablet 1  . Glucos-MSM-C-Mn-Ginger-Willow (GLUCOSAMINE MSM COMPLEX) TABS Take 2 tablets daily by mouth.     . levothyroxine (SYNTHROID) 50 MCG tablet Take 1 tablet (50 mcg total) by mouth daily before breakfast. 90 tablet 0  . metoprolol tartrate (LOPRESSOR) 25 MG tablet Take 12.5 mg by mouth 2 (two) times daily.     . rivaroxaban (XARELTO) 20 MG TABS tablet Take 1 tablet by mouth daily.    . tamsulosin (FLOMAX) 0.4 MG CAPS capsule Take 1 capsule (0.4 mg total) by mouth daily. 90 capsule 0   No current facility-administered medications on file prior to visit.     No Known Allergies  History reviewed. No pertinent family history.  Social History    Socioeconomic History  . Marital status: Married    Spouse name: Arbie Cookey  . Number of children: Not on file  . Years of education: Not on file  . Highest education level: Not on file  Occupational History  . Not on file  Social Needs  . Financial resource strain: Not on file  . Food insecurity:    Worry: Not on file    Inability: Not on file  . Transportation needs:    Medical: Not on file    Non-medical: Not on file  Tobacco Use  . Smoking status: Never Smoker  . Smokeless tobacco: Never Used  Substance and Sexual Activity  . Alcohol use: No    Frequency: Never  . Drug use: No  . Sexual activity: Yes  Lifestyle  . Physical activity:    Days per week: Not on file    Minutes per session: Not on file  . Stress: Not on file  Relationships  . Social connections:    Talks on phone: Not on file    Gets together: Not on file    Attends religious service: Not on file    Active member of club or organization: Not on file    Attends meetings of clubs or organizations: Not on file    Relationship status: Not on file  Other Topics Concern  . Not on file  Social History Narrative  . Not on file  Review of Systems - See HPI.  All other ROS are negative.  BP 118/80   Pulse 73   Temp 97.7 F (36.5 C) (Oral)   Resp 14   Ht 5' 9.5" (1.765 m)   Wt 200 lb (90.7 kg)   SpO2 98%   BMI 29.11 kg/m   Physical Exam  Constitutional: He is oriented to person, place, and time. He appears well-developed and well-nourished.  HENT:  Head: Normocephalic and atraumatic.  Neck: Neck supple.  Cardiovascular: Normal rate, regular rhythm, normal heart sounds and intact distal pulses.  Pulmonary/Chest: Effort normal and breath sounds normal. No stridor. No respiratory distress. He has no wheezes. He has no rales. He exhibits no tenderness.  Neurological: He is alert and oriented to person, place, and time.  Psychiatric: He has a normal mood and affect.  Vitals reviewed.  Recent Results  (from the past 2160 hour(s))  B Nat Peptide     Status: None   Collection Time: 06/06/18  9:59 AM  Result Value Ref Range   Pro B Natriuretic peptide (BNP) 66.0 0.0 - 100.0 pg/mL  Comp Met (CMET)     Status: Abnormal   Collection Time: 06/06/18  9:59 AM  Result Value Ref Range   Sodium 143 135 - 145 mEq/L   Potassium 3.6 3.5 - 5.1 mEq/L   Chloride 106 96 - 112 mEq/L   CO2 28 19 - 32 mEq/L   Glucose, Bld 98 70 - 99 mg/dL   BUN 30 (H) 6 - 23 mg/dL   Creatinine, Ser 0.95 0.40 - 1.50 mg/dL   Total Bilirubin 0.7 0.2 - 1.2 mg/dL   Alkaline Phosphatase 48 39 - 117 U/L   AST 18 0 - 37 U/L   ALT 24 0 - 53 U/L   Total Protein 5.9 (L) 6.0 - 8.3 g/dL   Albumin 3.9 3.5 - 5.2 g/dL   Calcium 9.0 8.4 - 10.5 mg/dL   GFR 81.35 >60.00 mL/min    Assessment/Plan: 1. Peripheral edema Much improved. Still with L >> R. Will check renal function and potassium levels today. Contiue supportive measures, sodium restricted diet and Lasix. Will alter regimen according to results. Giving L swelling will check [place] to r/o DVT as this has not improved as much as would be expected.  - US Venous Img Lower Unilateral Left; Future - Basic metabolic panel  2. Calf swelling - US Venous Img Lower Unilateral Left; Future   Leeanne Rio, PA-C

## 2018-06-30 ENCOUNTER — Other Ambulatory Visit (INDEPENDENT_AMBULATORY_CARE_PROVIDER_SITE_OTHER): Payer: 59

## 2018-06-30 DIAGNOSIS — E876 Hypokalemia: Secondary | ICD-10-CM

## 2018-06-30 LAB — BASIC METABOLIC PANEL
BUN: 30 mg/dL — ABNORMAL HIGH (ref 6–23)
CALCIUM: 8.7 mg/dL (ref 8.4–10.5)
CO2: 28 meq/L (ref 19–32)
CREATININE: 1.15 mg/dL (ref 0.40–1.50)
Chloride: 106 mEq/L (ref 96–112)
GFR: 65.24 mL/min (ref >60.00)
GLUCOSE: 135 mg/dL — AB (ref 70–99)
Potassium: 3.8 mEq/L (ref 3.5–5.1)
Sodium: 143 mEq/L (ref 135–145)

## 2018-07-20 MED FILL — ELIQUIS 5 MG TABLET: 5 | 90 days supply | Qty: 180 | Fill #0

## 2018-07-25 DIAGNOSIS — M545 Low back pain: Secondary | ICD-10-CM | POA: Diagnosis not present

## 2018-08-10 DIAGNOSIS — M48061 Spinal stenosis, lumbar region without neurogenic claudication: Secondary | ICD-10-CM | POA: Diagnosis not present

## 2018-08-10 DIAGNOSIS — M545 Low back pain: Secondary | ICD-10-CM | POA: Diagnosis not present

## 2018-08-10 DIAGNOSIS — M5117 Intervertebral disc disorders with radiculopathy, lumbosacral region: Secondary | ICD-10-CM | POA: Diagnosis not present

## 2018-08-11 ENCOUNTER — Other Ambulatory Visit: Payer: Self-pay | Admitting: Physician Assistant

## 2018-08-11 MED ORDER — LEVOTHYROXINE SODIUM 50 MCG PO TABS: 50.0000 ug | ORAL_TABLET | Freq: Every day | ORAL | 0 refills | Status: DC

## 2018-08-11 MED ORDER — TAMSULOSIN HCL 0.4 MG PO CAPS: 0.4000 mg | ORAL_CAPSULE | Freq: Every day | ORAL | 0 refills | Status: DC

## 2018-08-11 MED FILL — LEVOTHYROXINE 50 MCG TABLET: 50 | 90 days supply | Qty: 90 | Fill #0

## 2018-08-11 MED FILL — TAMSULOSIN HCL 0.4 MG CAP: 0.4 | 90 days supply | Qty: 90 | Fill #0

## 2018-08-17 ENCOUNTER — Other Ambulatory Visit: Payer: Self-pay | Admitting: Neurological Surgery

## 2018-08-17 DIAGNOSIS — I10 Essential (primary) hypertension: Secondary | ICD-10-CM | POA: Diagnosis not present

## 2018-08-17 DIAGNOSIS — M5416 Radiculopathy, lumbar region: Secondary | ICD-10-CM | POA: Diagnosis not present

## 2018-08-17 DIAGNOSIS — Z6829 Body mass index (BMI) 29.0-29.9, adult: Secondary | ICD-10-CM | POA: Diagnosis not present

## 2018-08-17 DIAGNOSIS — M545 Low back pain: Secondary | ICD-10-CM | POA: Diagnosis not present

## 2018-08-24 NOTE — Pre-Procedure Instructions (Signed)
[name].  08/24/2018      [place], [place] - [address] [address] Phone: [phone] Fax: [phone]    Your procedure is scheduled on Mon., Feb. 24, 2020 from 7:30AM-9:19AM  Report to [facility] Entrance "A" at 5:30AM  Call this number if you have problems the morning of surgery:  [phone]   Remember:  Do not eat or drink after midnight on Feb. 23rd    Take these medicines the morning of surgery with A SIP OF WATER: Acetaminophen (TYLENOL), Levothyroxine (SYNTHROID), Metoprolol tartrate (LOPRESSOR), and Tamsulosin (FLOMAX)   Follow your surgeon's instructions on when to stop Eliquis.  If no instructions were given by your surgeon then you will need to call the office to get those instructions.    7 days before surgery (08/28/18), stop taking all Other Aspirin Products, Vitamins, Fish oils, and Herbal medications. Also stop all NSAIDS i.e. Advil, Ibuprofen, Motrin, Aleve, Anaprox, Naproxen, BC, Goody Powders, and all Supplements.     Do not wear jewelry.  Do not wear lotions, powders, colognes, or deodorant.  Do not shave 48 hours prior to surgery.  Men may shave face.  Do not bring valuables to the hospital.  [facility] is not responsible for any belongings or valuables.  Contacts, dentures or bridgework may not be worn into surgery.  Leave your suitcase in the car.  After surgery it may be brought to your room.  For patients admitted to the hospital, discharge time will be determined by your treatment team.  Patients discharged the day of surgery will not be allowed to drive home.   Special instructions: [facility]- Preparing For Surgery  Before surgery, you can play an important role. Because skin is not sterile, your skin needs to be as free of germs as possible. You can reduce the number of germs on your skin by washing with CHG (chlorahexidine gluconate) Soap before  surgery.  CHG is an antiseptic cleaner which kills germs and bonds with the skin to continue killing germs even after washing.    Oral Hygiene is also important to reduce your risk of infection.  Remember - BRUSH YOUR TEETH THE MORNING OF SURGERY WITH YOUR REGULAR TOOTHPASTE  Please do not use if you have an allergy to CHG or antibacterial soaps. If your skin becomes reddened/irritated stop using the CHG.  Do not shave (including legs and underarms) for at least 48 hours prior to first CHG shower. It is OK to shave your face.  Please follow these instructions carefully.   1. Shower the NIGHT BEFORE SURGERY and the MORNING OF SURGERY with CHG.   2. If you chose to wash your hair, wash your hair first as usual with your normal shampoo.  3. After you shampoo, rinse your hair and body thoroughly to remove the shampoo.  4. Use CHG as you would any other liquid soap. You can apply CHG directly to the skin and wash gently with a scrungie or a clean washcloth.   5. Apply the CHG Soap to your body ONLY FROM THE NECK DOWN.  Do not use on open wounds or open sores. Avoid contact with your eyes, ears, mouth and genitals (private parts). Wash Face and genitals (private parts)  with your normal soap.  6. Wash thoroughly, paying special attention to the area where your surgery will be performed.  7. Thoroughly rinse your body with warm water from  the neck down.  8. DO NOT shower/wash with your normal soap after using and rinsing off the CHG Soap.  9. Pat yourself dry with a CLEAN TOWEL.  10. Wear CLEAN PAJAMAS to bed the night before surgery, wear comfortable clothes the morning of surgery  11. Place CLEAN SHEETS on your bed the night of your first shower and DO NOT SLEEP WITH PETS.  Day of Surgery:  Do not apply any deodorants/lotions.  Please wear clean clothes to the hospital/surgery center.   Remember to brush your teeth WITH YOUR REGULAR TOOTHPASTE.  Please read over the following fact  sheets that you were given. Pain Booklet, Coughing and Deep Breathing, MRSA Information and Surgical Site Infection Prevention

## 2018-08-25 ENCOUNTER — Ambulatory Visit (HOSPITAL_COMMUNITY)
Admission: RE | Admit: 2018-08-25 | Discharge: 2018-08-25 | Disposition: A | Discharge status: Home / Self Care | Payer: 59 | Source: Ambulatory Visit | Attending: Neurological Surgery | Admitting: Neurological Surgery

## 2018-08-25 ENCOUNTER — Other Ambulatory Visit: Payer: Self-pay

## 2018-08-25 ENCOUNTER — Encounter (HOSPITAL_COMMUNITY)
Admission: RE | Admit: 2018-08-25 | Discharge: 2018-08-25 | Disposition: A | Discharge status: Home / Self Care | Payer: 59 | Source: Ambulatory Visit | Attending: Neurological Surgery | Admitting: Neurological Surgery

## 2018-08-25 ENCOUNTER — Encounter (HOSPITAL_COMMUNITY): Payer: Self-pay

## 2018-08-25 DIAGNOSIS — M48061 Spinal stenosis, lumbar region without neurogenic claudication: Secondary | ICD-10-CM | POA: Insufficient documentation

## 2018-08-25 DIAGNOSIS — Z01818 Encounter for other preprocedural examination: Secondary | ICD-10-CM | POA: Diagnosis not present

## 2018-08-25 LAB — CBC WITH DIFFERENTIAL/PLATELET
Abs Immature Granulocytes: 0.16 10*3/uL — ABNORMAL HIGH (ref 0.00–0.07)
Basophils Absolute: 0 10*3/uL (ref 0.0–0.1)
Basophils Relative: 0 %
EOS ABS: 0.1 10*3/uL (ref 0.0–0.5)
Eosinophils Relative: 1 %
HEMATOCRIT: 42.2 % (ref 39.0–52.0)
Hemoglobin: 14 g/dL (ref 13.0–17.0)
Immature Granulocytes: 2 %
LYMPHS ABS: 1.6 10*3/uL (ref 0.7–4.0)
Lymphocytes Relative: 16 %
MCH: 31.5 pg (ref 26.0–34.0)
MCHC: 33.2 g/dL (ref 30.0–36.0)
MCV: 95 fL (ref 80.0–100.0)
Monocytes Absolute: 0.8 10*3/uL (ref 0.1–1.0)
Monocytes Relative: 8 %
Neutro Abs: 7.5 10*3/uL (ref 1.7–7.7)
Neutrophils Relative %: 73 %
Platelets: 181 10*3/uL (ref 150–400)
RBC: 4.44 MIL/uL (ref 4.22–5.81)
RDW: 12.8 % (ref 11.5–15.5)
WBC: 10.1 10*3/uL (ref 4.0–10.5)
nRBC: 0 % (ref 0.0–0.2)

## 2018-08-25 LAB — BASIC METABOLIC PANEL
Anion gap: 11 (ref 5–15)
BUN: 27 mg/dL — ABNORMAL HIGH (ref 8–23)
CO2: 26 mmol/L (ref 22–32)
Calcium: 9.2 mg/dL (ref 8.9–10.3)
Chloride: 105 mmol/L (ref 98–111)
Creatinine, Ser: 1.2 mg/dL (ref 0.61–1.24)
GFR calc non Af Amer: 58 mL/min — ABNORMAL LOW (ref >60)
Glucose, Bld: 101 mg/dL — ABNORMAL HIGH (ref 70–99)
Potassium: 3.7 mmol/L (ref 3.5–5.1)
Sodium: 142 mmol/L (ref 135–145)

## 2018-08-25 LAB — SURGICAL PCR SCREEN
MRSA, PCR: NEGATIVE
Staphylococcus aureus: NEGATIVE

## 2018-08-25 NOTE — Progress Notes (Signed)
PCP - PA Raiford Noble  Cardiologist - Dr. Cline Cools- [place]- [phone] fx- [phone]  Chest x-ray - 08/25/2018  EKG - 02/22/18 (CE)- Req'd  Stress Test - Denies  ECHO - 03/28/18 (CE)  Cardiac Cath - 02/08/13 (CE)  AICD-na PM-na LOOP-na  Sleep Study - Denies CPAP - None  LABS- 08/25/2018: CBC w/D, BMP, PCR 09/04/2018: PT  ASA- Denies Eliquis- Awaiting instructions from Dr. Lorne Skeens. Message left on 08/25/2018   Anesthesia- Yes- req'd EKG  Pt denies having chest pain, sob, or fever at this time. All instructions explained to the pt, with a verbal understanding of the material. Pt agrees to go over the instructions while at home for a better understanding. The opportunity to ask questions was provided.

## 2018-08-28 NOTE — Progress Notes (Addendum)
Anesthesia Chart Review:  Case:  [id number] Date/Time:  02/24/Keith 0715   Procedure:  Laminectomy and Foraminotomy - L2-L3 - bilateral (Bilateral Back)   Anesthesia type:  General   Pre-op diagnosis:  Stenosis   Location:  [facility] OR ROOM [facility]   Surgeon:  Eustace Moore, MD      DISCUSSION: Patient is as 78 year old [name] scheduled for the above procedure.  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 [place] locally.   Cardiology clearance pending from his cardiologist Dr. Lorne Skeens in [place] and is awaiting perioperative Eliquis instructions. Locally, he has also seen Rozann Lesches (last on 09/08/17) with rate control and anticoagulation continued for afib management. I spoke with Lorriane Shire at Dr. Ronnald Ramp' office regarding clearance status. They are asking for patient to hold Eliquis for three days prior to surgery. She will follow-up with patient and cardiology.   Chart will be left for follow-up.   ADDENDUM 2/21/Keith 9:06 AM: Dr. Lorne Skeens completed cardiac clearance form. He considered patient "low" risk for procedure and approved to hold Eliquis for 2 days prior to surgery. Will re-request EKG from [facility]. He will need PT/INR on the day of surgery.   VS: BP (!) 154/94   Pulse 79   Temp 36.4 C   Resp Keith   Ht 5\' 11"  (1.803 m)   Wt 93.8 kg   SpO2 100%   BMI 28.83 kg/m     PROVIDERS: Brunetta Jeans, PA-C is PCP with [facility].  Cline Cools, DO is cardiologist with [facility]  (phone [phone], fax [phone]; see Care Everywhere). Last visit 03/28/18. Echo stable and afib remained rate controlled.   LABS: Labs reviewed: Acceptable for surgery. (all labs ordered are listed, but only abnormal results are displayed)  Labs Reviewed  BASIC METABOLIC PANEL - Abnormal; Notable for the following components:      Result Value   Glucose, Bld 101 (*)    BUN 27 (*)    GFR calc non Af Amer 58 (*)    All other components within normal limits  CBC WITH DIFFERENTIAL/PLATELET - Abnormal; Notable for the following components:   Abs Immature Granulocytes 0.16 (*)    All other components within normal limits  SURGICAL PCR SCREEN    IMAGES: CXR 12/14/Keith: IMPRESSION: No active cardiopulmonary disease.   EKG: - EKG 02/22/18 ([place]): Result Impression: Atrial fibrillation 91 bpm, normal axis. TRACING REQUESTED.   CV: LLE venous Doppler 06/12/18: IMPRESSION: No evidence of deep venous thrombosis. Popliteal cyst  Echo 03/28/18 ([place]): Result Narrative:  Normal LV size with normal LV systolic function, estimated LVEF 60-65%  Mild fairly concentric LVH  Moderate to severe biatrial enlargement  Well-seated normal functioning bioprosthetic aortic valve with normal  Doppler velocities  Trivial to mild mitral insufficiency, trivial to mild tricuspid  insufficiency  Normal estimated right heart pressures  No pericardial effusion  Proximal ascending aorta upper normal at 3.9 cm  The patient was in atrial fibrillation throughout  the study  No significant change in comparison to the prior echocardiogram  TEE (with DCCV) 06/26/2015 ([place]): 1. No intracardiac thrombus visualized. 2. Moderate left atrial enlargement. 3. Normal left ventricular size and normal systolic function. Estimated EF 65%. 4. Trivial mitral insufficiency, trivial tricuspid insufficiency, and normal functioning  bioprosthetic aortic valve. Peak velocity through the aortic valve is 1.66m/sec. 5. Normal size aortic root. 6. Bubble study showing a small patent foramen ovale (small right to left atrial shunt).  Cardiac cath 01/18/08 (Media tab, Correspondence, Encounter 06/01/17; copy received from Dr. Randye Lobo office): 1. Normal-appearing coronary arteries with no evidence of any significant atherosclerotic coronary disease by angiography.  2. 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  3. Normal left ventricular systolic function.  (Patient subsequently underwent AVR 02/27/08.)   Past Medical History:  Diagnosis Date  . Arthritis   . Atrial fibrillation ([place])    Cardioversion 2016  . Atrial flutter ([place])    Cardioversion 2013  . 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 Dysrhythmia   . GERD (gastroesophageal reflux disease)   . History of aortic valve replacement with bioprosthetic valve    Aortic regurgitation  . History of chicken pox   . History of kidney stones   . History of skin cancer   . History of stroke 2011  . Seasonal allergies     Past Surgical History:  Procedure Laterality Date  . AORTIC VALVE REPLACEMENT  2009   Bioprosthetic AVR and root replacement  . BACK SURGERY    . EPIGASTRIC HERNIA REPAIR    . HERNIA REPAIR    . LUMBAR LAMINECTOMY/DECOMPRESSION MICRODISCECTOMY Right 06/01/2017   Procedure: EXTRAFORAMINAL MICRODISCECTOMY LUMBAR FIVE- SACRAL ONE, RIGHT;  Surgeon: Eustace Moore, MD;  Location: [place];  Service: Neurosurgery;  Laterality: Right;    MEDICATIONS: . acetaminophen (TYLENOL) 500 MG tablet  . apixaban (ELIQUIS) 5 MG TABS tablet  . furosemide (LASIX) Keith MG tablet  . Glucos-MSM-C-Mn-Ginger-Willow (GLUCOSAMINE MSM COMPLEX) TABS  . ibuprofen (ADVIL,MOTRIN) 200 MG tablet  . levothyroxine (SYNTHROID) 50 MCG tablet  . metoprolol tartrate (LOPRESSOR) 25 MG tablet  . tamsulosin (FLOMAX) 0.4 MG CAPS capsule   No current facility-administered medications for this  encounter.     Myra Gianotti, PA-C Surgical Short Stay/Anesthesiology [facility] Phone [phone] [facility] Phone [phone] 08/29/2018 9:28 AM

## 2018-09-03 NOTE — Anesthesia Preprocedure Evaluation (Addendum)
Anesthesia Evaluation  Patient identified by MRN, date of birth, ID band Patient awake    Reviewed: Allergy & Precautions, NPO status , Patient's Chart, lab work & pertinent test results, reviewed documented beta blocker date and time   Airway Mallampati: II  TM Distance: >3 FB Neck ROM: Full    Dental no notable dental hx.    Pulmonary neg pulmonary ROS,    Pulmonary exam normal breath sounds clear to auscultation       Cardiovascular hypertension, Pt. on home beta blockers Normal cardiovascular exam+ dysrhythmias Atrial Fibrillation  Rhythm:Regular Rate:Normal  ECHO: LV EF: 60% - 65%  Dr. Lorne Skeens completed cardiac clearance  S/p AVR     Neuro/Psych CVA, No Residual Symptoms negative psych ROS   GI/Hepatic negative GI ROS, Neg liver ROS,   Endo/Other  Hypothyroidism   Renal/GU negative Renal ROS     Musculoskeletal negative musculoskeletal ROS (+)   Abdominal   Peds  Hematology negative hematology ROS (+)   Anesthesia Other Findings Stenosis  Reproductive/Obstetrics                            Anesthesia Physical Anesthesia Plan  ASA: III  Anesthesia Plan: General   Post-op Pain Management:    Induction: Intravenous  PONV Risk Score and Plan: 3 and Midazolam, Dexamethasone, Ondansetron and Treatment may vary due to age or medical condition  Airway Management Planned: Oral ETT  Additional Equipment:   Intra-op Plan:   Post-operative Plan: Extubation in OR  Informed Consent: I have reviewed the patients History and Physical, chart, labs and discussed the procedure including the risks, benefits and alternatives for the proposed anesthesia with the patient or authorized representative who has indicated his/her understanding and acceptance.     Dental advisory given  Plan Discussed with: CRNA  Anesthesia Plan Comments:        Anesthesia Quick Evaluation

## 2018-09-04 ENCOUNTER — Ambulatory Visit (HOSPITAL_COMMUNITY): Payer: 59 | Admitting: Anesthesiology

## 2018-09-04 ENCOUNTER — Encounter (HOSPITAL_COMMUNITY): Payer: Self-pay | Admitting: *Deleted

## 2018-09-04 ENCOUNTER — Other Ambulatory Visit: Payer: Self-pay

## 2018-09-04 ENCOUNTER — Encounter (HOSPITAL_COMMUNITY)
Admission: RE | Disposition: A | Discharge status: Home / Self Care | Payer: Self-pay | Source: Home / Self Care | Attending: Neurological Surgery

## 2018-09-04 ENCOUNTER — Ambulatory Visit (HOSPITAL_COMMUNITY): Payer: 59

## 2018-09-04 ENCOUNTER — Ambulatory Visit (HOSPITAL_COMMUNITY): Payer: 59 | Admitting: Physician Assistant

## 2018-09-04 ENCOUNTER — Ambulatory Visit (HOSPITAL_COMMUNITY)
Admission: RE | Admit: 2018-09-04 | Discharge: 2018-09-04 | Disposition: A | Discharge status: Home / Self Care | Payer: 59 | Attending: Neurological Surgery | Admitting: Neurological Surgery

## 2018-09-04 DIAGNOSIS — M545 Low back pain: Secondary | ICD-10-CM | POA: Diagnosis not present

## 2018-09-04 DIAGNOSIS — Z419 Encounter for procedure for purposes other than remedying health state, unspecified: Secondary | ICD-10-CM

## 2018-09-04 DIAGNOSIS — M79604 Pain in right leg: Secondary | ICD-10-CM | POA: Diagnosis not present

## 2018-09-04 DIAGNOSIS — Z79899 Other long term (current) drug therapy: Secondary | ICD-10-CM | POA: Insufficient documentation

## 2018-09-04 DIAGNOSIS — Z9889 Other specified postprocedural states: Secondary | ICD-10-CM

## 2018-09-04 DIAGNOSIS — M48061 Spinal stenosis, lumbar region without neurogenic claudication: Secondary | ICD-10-CM | POA: Diagnosis not present

## 2018-09-04 DIAGNOSIS — I10 Essential (primary) hypertension: Secondary | ICD-10-CM | POA: Insufficient documentation

## 2018-09-04 DIAGNOSIS — Z953 Presence of xenogenic heart valve: Secondary | ICD-10-CM | POA: Insufficient documentation

## 2018-09-04 DIAGNOSIS — E039 Hypothyroidism, unspecified: Secondary | ICD-10-CM | POA: Insufficient documentation

## 2018-09-04 DIAGNOSIS — Z7901 Long term (current) use of anticoagulants: Secondary | ICD-10-CM | POA: Diagnosis not present

## 2018-09-04 DIAGNOSIS — I4892 Unspecified atrial flutter: Secondary | ICD-10-CM | POA: Insufficient documentation

## 2018-09-04 DIAGNOSIS — Z79890 Hormone replacement therapy (postmenopausal): Secondary | ICD-10-CM | POA: Diagnosis not present

## 2018-09-04 DIAGNOSIS — Z981 Arthrodesis status: Secondary | ICD-10-CM | POA: Diagnosis not present

## 2018-09-04 DIAGNOSIS — Z791 Long term (current) use of non-steroidal anti-inflammatories (NSAID): Secondary | ICD-10-CM | POA: Diagnosis not present

## 2018-09-04 DIAGNOSIS — Z8673 Personal history of transient ischemic attack (TIA), and cerebral infarction without residual deficits: Secondary | ICD-10-CM | POA: Insufficient documentation

## 2018-09-04 DIAGNOSIS — I4891 Unspecified atrial fibrillation: Secondary | ICD-10-CM | POA: Insufficient documentation

## 2018-09-04 HISTORY — PX: LUMBAR LAMINECTOMY/DECOMPRESSION MICRODISCECTOMY: SHX5026

## 2018-09-04 LAB — PROTIME-INR
INR: 1.11
Prothrombin Time: 14.2 seconds (ref 11.4–15.2)

## 2018-09-04 SURGERY — LUMBAR LAMINECTOMY/DECOMPRESSION MICRODISCECTOMY 1 LEVEL
Anesthesia: General | Site: Back | Laterality: Bilateral

## 2018-09-04 MED ORDER — CELECOXIB 200 MG PO CAPS
200.0000 mg | ORAL_CAPSULE | Freq: Two times a day (BID) | ORAL | Status: DC
  Administered 2018-09-04: 200 mg via ORAL
  Filled 2018-09-04: qty 1

## 2018-09-04 MED ORDER — DEXAMETHASONE SODIUM PHOSPHATE 4 MG/ML IJ SOLN
INTRAMUSCULAR | Status: DC | PRN
  Administered 2018-09-04: 10 mg via INTRAVENOUS

## 2018-09-04 MED ORDER — DEXAMETHASONE SODIUM PHOSPHATE 4 MG/ML IJ SOLN: 4.0000 mg | Freq: Four times a day (QID) | INTRAMUSCULAR | Status: DC

## 2018-09-04 MED ORDER — SUGAMMADEX SODIUM 200 MG/2ML IV SOLN
INTRAVENOUS | Status: DC | PRN
  Administered 2018-09-04: 200 mg via INTRAVENOUS

## 2018-09-04 MED ORDER — METOPROLOL TARTRATE 12.5 MG HALF TABLET: 12.5000 mg | ORAL_TABLET | Freq: Two times a day (BID) | ORAL | Status: DC

## 2018-09-04 MED ORDER — MORPHINE SULFATE (PF) 2 MG/ML IV SOLN: 2.0000 mg | INTRAVENOUS | Status: DC | PRN

## 2018-09-04 MED ORDER — SODIUM CHLORIDE 0.9% FLUSH
3.0000 mL | Freq: Two times a day (BID) | INTRAVENOUS | Status: DC
  Administered 2018-09-04: 3 mL via INTRAVENOUS

## 2018-09-04 MED ORDER — CHLORHEXIDINE GLUCONATE CLOTH 2 % EX PADS: 6.0000 | MEDICATED_PAD | Freq: Once | CUTANEOUS | Status: DC

## 2018-09-04 MED ORDER — DEXAMETHASONE 4 MG PO TABS
4.0000 mg | ORAL_TABLET | Freq: Four times a day (QID) | ORAL | Status: DC
  Administered 2018-09-04: 4 mg via ORAL
  Filled 2018-09-04: qty 1

## 2018-09-04 MED ORDER — ACETAMINOPHEN 325 MG PO TABS: 650.0000 mg | ORAL_TABLET | ORAL | Status: DC | PRN

## 2018-09-04 MED ORDER — 0.9 % SODIUM CHLORIDE (POUR BTL) OPTIME
TOPICAL | Status: DC | PRN
  Administered 2018-09-04: 1000 mL

## 2018-09-04 MED ORDER — PROPOFOL 10 MG/ML IV BOLUS
INTRAVENOUS | Status: AC
  Filled 2018-09-04: qty 40

## 2018-09-04 MED ORDER — TAMSULOSIN HCL 0.4 MG PO CAPS
0.4000 mg | ORAL_CAPSULE | Freq: Every day | ORAL | Status: DC
  Filled 2018-09-04: qty 1

## 2018-09-04 MED ORDER — PROPOFOL 10 MG/ML IV BOLUS
INTRAVENOUS | Status: DC | PRN
  Administered 2018-09-04: 150 mg via INTRAVENOUS

## 2018-09-04 MED ORDER — HYDROMORPHONE HCL 1 MG/ML IJ SOLN
0.2500 mg | INTRAMUSCULAR | Status: DC | PRN
  Administered 2018-09-04 (×2): 0.5 mg via INTRAVENOUS

## 2018-09-04 MED ORDER — BUPIVACAINE HCL (PF) 0.25 % IJ SOLN
INTRAMUSCULAR | Status: AC
  Filled 2018-09-04: qty 30

## 2018-09-04 MED ORDER — HEMOSTATIC AGENTS (NO CHARGE) OPTIME
TOPICAL | Status: DC | PRN
  Administered 2018-09-04: 1 via TOPICAL

## 2018-09-04 MED ORDER — THROMBIN 5000 UNITS EX SOLR
CUTANEOUS | Status: DC | PRN
  Administered 2018-09-04 (×2): 5000 [IU] via TOPICAL

## 2018-09-04 MED ORDER — POTASSIUM CHLORIDE IN NACL 20-0.9 MEQ/L-% IV SOLN: INTRAVENOUS | Status: DC

## 2018-09-04 MED ORDER — OXYCODONE HCL 5 MG/5ML PO SOLN: 5.0000 mg | Freq: Once | ORAL | Status: AC | PRN

## 2018-09-04 MED ORDER — ONDANSETRON HCL 4 MG/2ML IJ SOLN: 4.0000 mg | Freq: Four times a day (QID) | INTRAMUSCULAR | Status: DC | PRN

## 2018-09-04 MED ORDER — METHOCARBAMOL 500 MG PO TABS
500.0000 mg | ORAL_TABLET | Freq: Four times a day (QID) | ORAL | Status: DC | PRN
  Administered 2018-09-04: 500 mg via ORAL

## 2018-09-04 MED ORDER — ONDANSETRON HCL 4 MG PO TABS: 4.0000 mg | ORAL_TABLET | Freq: Four times a day (QID) | ORAL | Status: DC | PRN

## 2018-09-04 MED ORDER — LACTATED RINGERS IV SOLN
INTRAVENOUS | Status: DC
  Administered 2018-09-04: 07:00:00 via INTRAVENOUS

## 2018-09-04 MED ORDER — ROCURONIUM BROMIDE 50 MG/5ML IV SOSY
PREFILLED_SYRINGE | INTRAVENOUS | Status: DC | PRN
  Administered 2018-09-04: 50 mg via INTRAVENOUS

## 2018-09-04 MED ORDER — FENTANYL CITRATE (PF) 100 MCG/2ML IJ SOLN
INTRAMUSCULAR | Status: DC | PRN
  Administered 2018-09-04 (×2): 25 ug via INTRAVENOUS
  Administered 2018-09-04: 50 ug via INTRAVENOUS
  Administered 2018-09-04 (×2): 25 ug via INTRAVENOUS
  Administered 2018-09-04: 100 ug via INTRAVENOUS

## 2018-09-04 MED ORDER — SODIUM CHLORIDE 0.9 % IV SOLN
INTRAVENOUS | Status: DC | PRN
  Administered 2018-09-04: 50 ug/min via INTRAVENOUS

## 2018-09-04 MED ORDER — SENNA 8.6 MG PO TABS: 1.0000 | ORAL_TABLET | Freq: Two times a day (BID) | ORAL | Status: DC

## 2018-09-04 MED ORDER — HYDROCODONE-ACETAMINOPHEN 7.5-325 MG PO TABS
1.0000 | ORAL_TABLET | Freq: Four times a day (QID) | ORAL | Status: DC
  Administered 2018-09-04: 1 via ORAL
  Filled 2018-09-04: qty 1

## 2018-09-04 MED ORDER — CEFAZOLIN SODIUM-DEXTROSE 2-4 GM/100ML-% IV SOLN
2.0000 g | INTRAVENOUS | Status: AC
  Administered 2018-09-04: 2 g via INTRAVENOUS
  Filled 2018-09-04: qty 100

## 2018-09-04 MED ORDER — ACETAMINOPHEN 500 MG PO TABS
1000.0000 mg | ORAL_TABLET | Freq: Once | ORAL | Status: AC
  Administered 2018-09-04: 1000 mg via ORAL
  Filled 2018-09-04: qty 2

## 2018-09-04 MED ORDER — ONDANSETRON HCL 4 MG/2ML IJ SOLN
INTRAMUSCULAR | Status: DC | PRN
  Administered 2018-09-04: 4 mg via INTRAVENOUS

## 2018-09-04 MED ORDER — OXYCODONE HCL 5 MG PO TABS
5.0000 mg | ORAL_TABLET | Freq: Once | ORAL | Status: AC | PRN
  Administered 2018-09-04: 5 mg via ORAL

## 2018-09-04 MED ORDER — THROMBIN 5000 UNITS EX SOLR
CUTANEOUS | Status: AC
  Filled 2018-09-04: qty 15000

## 2018-09-04 MED ORDER — PROMETHAZINE HCL 25 MG/ML IJ SOLN: 6.2500 mg | INTRAMUSCULAR | Status: DC | PRN

## 2018-09-04 MED ORDER — PHENOL 1.4 % MT LIQD: 1.0000 | OROMUCOSAL | Status: DC | PRN

## 2018-09-04 MED ORDER — HYDROMORPHONE HCL 1 MG/ML IJ SOLN
INTRAMUSCULAR | Status: AC
  Administered 2018-09-04: 0.5 mg via INTRAVENOUS
  Filled 2018-09-04: qty 1

## 2018-09-04 MED ORDER — OXYCODONE HCL 5 MG PO TABS
ORAL_TABLET | ORAL | Status: AC
  Filled 2018-09-04: qty 1

## 2018-09-04 MED ORDER — BUPIVACAINE HCL (PF) 0.25 % IJ SOLN
INTRAMUSCULAR | Status: DC | PRN
  Administered 2018-09-04: 2 mL

## 2018-09-04 MED ORDER — METHOCARBAMOL 500 MG PO TABS
ORAL_TABLET | ORAL | Status: AC
  Filled 2018-09-04: qty 1

## 2018-09-04 MED ORDER — LIDOCAINE 2% (20 MG/ML) 5 ML SYRINGE
INTRAMUSCULAR | Status: DC | PRN
  Administered 2018-09-04: 60 mg via INTRAVENOUS

## 2018-09-04 MED ORDER — LEVOTHYROXINE SODIUM 50 MCG PO TABS
50.0000 ug | ORAL_TABLET | Freq: Every day | ORAL | Status: DC
  Filled 2018-09-04: qty 1

## 2018-09-04 MED ORDER — CEFAZOLIN SODIUM-DEXTROSE 2-4 GM/100ML-% IV SOLN
2.0000 g | Freq: Three times a day (TID) | INTRAVENOUS | Status: DC
  Administered 2018-09-04: 2 g via INTRAVENOUS
  Filled 2018-09-04: qty 100

## 2018-09-04 MED ORDER — METHOCARBAMOL 500 MG PO TABS: 500.0000 mg | ORAL_TABLET | Freq: Four times a day (QID) | ORAL | 0 refills | Status: DC | PRN

## 2018-09-04 MED ORDER — ACETAMINOPHEN 650 MG RE SUPP: 650.0000 mg | RECTAL | Status: DC | PRN

## 2018-09-04 MED ORDER — DEXAMETHASONE SODIUM PHOSPHATE 10 MG/ML IJ SOLN
10.0000 mg | INTRAMUSCULAR | Status: DC
  Filled 2018-09-04: qty 1

## 2018-09-04 MED ORDER — HYDROCODONE-ACETAMINOPHEN 7.5-325 MG PO TABS: 1.0000 | ORAL_TABLET | Freq: Four times a day (QID) | ORAL | 0 refills | Status: DC

## 2018-09-04 MED ORDER — EPHEDRINE SULFATE 50 MG/ML IJ SOLN
INTRAMUSCULAR | Status: DC | PRN
  Administered 2018-09-04 (×2): 5 mg via INTRAVENOUS

## 2018-09-04 MED ORDER — SODIUM CHLORIDE 0.9 % IV SOLN: 250.0000 mL | INTRAVENOUS | Status: DC

## 2018-09-04 MED ORDER — METHOCARBAMOL 1000 MG/10ML IJ SOLN
500.0000 mg | Freq: Four times a day (QID) | INTRAVENOUS | Status: DC | PRN
  Filled 2018-09-04: qty 5

## 2018-09-04 MED ORDER — SODIUM CHLORIDE 0.9 % IV SOLN
INTRAVENOUS | Status: DC | PRN
  Administered 2018-09-04: 09:00:00

## 2018-09-04 MED ORDER — MENTHOL 3 MG MT LOZG: 1.0000 | LOZENGE | OROMUCOSAL | Status: DC | PRN

## 2018-09-04 MED ORDER — PHENYLEPHRINE 40 MCG/ML (10ML) SYRINGE FOR IV PUSH (FOR BLOOD PRESSURE SUPPORT)
PREFILLED_SYRINGE | INTRAVENOUS | Status: DC | PRN
  Administered 2018-09-04 (×2): 80 ug via INTRAVENOUS

## 2018-09-04 MED ORDER — FENTANYL CITRATE (PF) 250 MCG/5ML IJ SOLN
INTRAMUSCULAR | Status: AC
  Filled 2018-09-04: qty 5

## 2018-09-04 MED ORDER — SODIUM CHLORIDE 0.9% FLUSH: 3.0000 mL | INTRAVENOUS | Status: DC | PRN

## 2018-09-04 MED FILL — HYDROCODON-APAP 7.5-325: 7.5-325 | 7 days supply | Qty: 30 | Fill #0

## 2018-09-04 MED FILL — METHOCARBAMOL 500 MG TABLET: 500 | 7 days supply | Qty: 30 | Fill #0

## 2018-09-04 SURGICAL SUPPLY — 49 items
BAG DECANTER FOR FLEXI CONT (MISCELLANEOUS) ×2 IMPLANT
BENZOIN TINCTURE PRP APPL 2/3 (GAUZE/BANDAGES/DRESSINGS) ×2 IMPLANT
BUR MATCHSTICK NEURO 3.0 LAGG (BURR) ×2 IMPLANT
CANISTER SUCT 3000ML PPV (MISCELLANEOUS) ×2 IMPLANT
CARTRIDGE OIL MAESTRO DRILL (MISCELLANEOUS) ×1 IMPLANT
COVER WAND RF STERILE (DRAPES) ×1 IMPLANT
DERMABOND ADVANCED (GAUZE/BANDAGES/DRESSINGS) ×1
DERMABOND ADVANCED .7 DNX12 (GAUZE/BANDAGES/DRESSINGS) IMPLANT
DIFFUSER DRILL AIR PNEUMATIC (MISCELLANEOUS) ×2 IMPLANT
DRAPE LAPAROTOMY 100X72X124 (DRAPES) ×2 IMPLANT
DRAPE MICROSCOPE LEICA (MISCELLANEOUS) ×1 IMPLANT
DRAPE POUCH INSTRU U-SHP 10X18 (DRAPES) ×1 IMPLANT
DRAPE SURG 17X23 STRL (DRAPES) ×2 IMPLANT
DRSG OPSITE POSTOP 3X4 (GAUZE/BANDAGES/DRESSINGS) ×1 IMPLANT
DURAPREP 26ML APPLICATOR (WOUND CARE) ×2 IMPLANT
ELECT REM PT RETURN 9FT ADLT (ELECTROSURGICAL) ×2
ELECTRODE REM PT RTRN 9FT ADLT (ELECTROSURGICAL) ×1 IMPLANT
GAUZE 4X4 16PLY RFD (DISPOSABLE) IMPLANT
GLOVE BIO SURGEON STRL SZ7 (GLOVE) ×4 IMPLANT
GLOVE BIO SURGEON STRL SZ8 (GLOVE) ×2 IMPLANT
GLOVE BIOGEL PI IND STRL 7.0 (GLOVE) IMPLANT
GLOVE BIOGEL PI IND STRL 7.5 (GLOVE) IMPLANT
GLOVE BIOGEL PI INDICATOR 7.0 (GLOVE) ×1
GLOVE BIOGEL PI INDICATOR 7.5 (GLOVE) ×2
GOWN STRL REUS W/ TWL LRG LVL3 (GOWN DISPOSABLE) IMPLANT
GOWN STRL REUS W/ TWL XL LVL3 (GOWN DISPOSABLE) ×1 IMPLANT
GOWN STRL REUS W/TWL 2XL LVL3 (GOWN DISPOSABLE) IMPLANT
GOWN STRL REUS W/TWL LRG LVL3 (GOWN DISPOSABLE) ×2
GOWN STRL REUS W/TWL XL LVL3 (GOWN DISPOSABLE) ×1
KIT BASIN OR (CUSTOM PROCEDURE TRAY) ×2 IMPLANT
KIT TURNOVER KIT B (KITS) ×2 IMPLANT
NDL HYPO 25X1 1.5 SAFETY (NEEDLE) ×1 IMPLANT
NDL SPNL 20GX3.5 QUINCKE YW (NEEDLE) IMPLANT
NEEDLE HYPO 25X1 1.5 SAFETY (NEEDLE) ×2 IMPLANT
NEEDLE SPNL 20GX3.5 QUINCKE YW (NEEDLE) ×2 IMPLANT
NS IRRIG 1000ML POUR BTL (IV SOLUTION) ×2 IMPLANT
OIL CARTRIDGE MAESTRO DRILL (MISCELLANEOUS) ×2
PACK LAMINECTOMY NEURO (CUSTOM PROCEDURE TRAY) ×2 IMPLANT
PAD ARMBOARD 7.5X6 YLW CONV (MISCELLANEOUS) ×6 IMPLANT
RUBBERBAND STERILE (MISCELLANEOUS) ×2 IMPLANT
SPONGE SURGIFOAM ABS GEL SZ50 (HEMOSTASIS) ×1 IMPLANT
STRIP CLOSURE SKIN 1/2X4 (GAUZE/BANDAGES/DRESSINGS) ×2 IMPLANT
SUT VIC AB 0 CT1 18XCR BRD8 (SUTURE) ×1 IMPLANT
SUT VIC AB 0 CT1 8-18 (SUTURE) ×1
SUT VIC AB 2-0 CP2 18 (SUTURE) ×2 IMPLANT
SUT VIC AB 3-0 SH 8-18 (SUTURE) ×3 IMPLANT
TOWEL GREEN STERILE (TOWEL DISPOSABLE) ×2 IMPLANT
TOWEL GREEN STERILE FF (TOWEL DISPOSABLE) ×2 IMPLANT
WATER STERILE IRR 1000ML POUR (IV SOLUTION) ×2 IMPLANT

## 2018-09-04 NOTE — Transfer of Care (Signed)
Immediate Anesthesia Transfer of Care Note  Patient: Keith [name].  Procedure(s) Performed: Laminectomy and Foraminotomy - Lumbar two-three bilateral (Bilateral Back)  Patient Location: PACU  Anesthesia Type:General  Level of Consciousness: awake and alert   Airway & Oxygen Therapy: Patient Spontanous Breathing and Patient connected to face mask oxygen  Post-op Assessment: Report given to RN and Post -op Vital signs reviewed and stable  Post vital signs: Reviewed and stable  Last Vitals:  Vitals Value Taken Time  BP 134/79 09/04/2018  9:00 AM  Temp    Pulse 74 09/04/2018  9:01 AM  Resp 13 09/04/2018  9:01 AM  SpO2 100 % 09/04/2018  9:01 AM  Vitals shown include unvalidated device data.  Last Pain:  Vitals:   09/04/18 0654  TempSrc:   PainSc: 0-No pain      Patients Stated Pain Goal: 4 (28/41/32 4401)  Complications: No apparent anesthesia complications

## 2018-09-04 NOTE — Anesthesia Postprocedure Evaluation (Signed)
Anesthesia Post Note  Patient: Keith [name].  Procedure(s) Performed: Laminectomy and Foraminotomy - Lumbar two-three bilateral (Bilateral Back)     Patient location during evaluation: PACU Anesthesia Type: General Level of consciousness: awake and alert Pain management: pain level controlled Vital Signs Assessment: post-procedure vital signs reviewed and stable Respiratory status: spontaneous breathing, nonlabored ventilation, respiratory function stable and patient connected to nasal cannula oxygen Cardiovascular status: blood pressure returned to baseline and stable Postop Assessment: no apparent nausea or vomiting Anesthetic complications: no    Last Vitals:  Vitals:   09/04/18 1015 09/04/18 1023  BP: (!) 134/98 (!) 153/90  Pulse: 63 74  Resp: 16 16  Temp:  36.5 C  SpO2: 98% 99%    Last Pain:  Vitals:   09/04/18 1023  TempSrc: Oral  PainSc:                  [name]

## 2018-09-04 NOTE — Progress Notes (Signed)
Patient alert and oriented, mae's well, voiding adequate amount of urine, swallowing without difficulty, no c/o pain at time of discharge. Patient discharged home with family. Script and discharged instructions given to patient. Patient and family stated understanding of instructions given. Patient has an appointment with Dr. Arnold °

## 2018-09-04 NOTE — H&P (Signed)
Subjective: Patient is a 78 y.o. male admitted for stenosis. Onset of symptoms was several months ago, gradually worsening since that time.  The pain is rated severe, and is located at the across the lower back and radiates to RLE. The pain is described as aching and occurs intermittently. The symptoms have been progressive. Symptoms are exacerbated by exercise. MRI or CT showed stenosis progressive at L2-3   Past Medical History:  Diagnosis Date  . Arthritis   . Atrial fibrillation ([place])    Cardioversion 2016  . Atrial flutter ([place])    Cardioversion 2013  . 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 Dysrhythmia   . GERD (gastroesophageal reflux disease)   . History of aortic valve replacement with bioprosthetic valve    Aortic regurgitation  . History of chicken pox   . History of kidney stones   . History of skin cancer   . History of stroke 2011  . Seasonal allergies   . Stroke [facility])     Past Surgical History:  Procedure Laterality Date  . AORTIC VALVE REPLACEMENT  2009   Bioprosthetic AVR and root replacement  . BACK SURGERY    . EPIGASTRIC HERNIA REPAIR    . HERNIA REPAIR    . LUMBAR LAMINECTOMY/DECOMPRESSION MICRODISCECTOMY Right 06/01/2017   Procedure: EXTRAFORAMINAL MICRODISCECTOMY LUMBAR FIVE- SACRAL ONE, RIGHT;  Surgeon: Eustace Moore, MD;  Location: [place];  Service: Neurosurgery;  Laterality: Right;    Prior to Admission medications   Medication Sig Start Date End Date Taking? Authorizing Provider  acetaminophen (TYLENOL) 500 MG tablet Take 500 mg by mouth daily.   Yes [provider]  apixaban (ELIQUIS) 5 MG TABS tablet Take 5 mg by mouth 2 (two) times daily.   Yes [provider]  furosemide (LASIX) 20 MG tablet Take 1 tablet (20 mg total) by mouth daily. Patient taking differently: Take 20 mg by mouth daily as needed for edema.  06/06/18  Yes Brunetta Jeans, PA-C  Glucos-MSM-C-Mn-Ginger-Willow (GLUCOSAMINE MSM COMPLEX) TABS Take 1 tablet by  mouth 2 (two) times daily.    Yes [provider]  ibuprofen (ADVIL,MOTRIN) 200 MG tablet Take 400 mg by mouth daily.   Yes [provider]  levothyroxine (SYNTHROID) 50 MCG tablet Take 1 tablet (50 mcg total) by mouth daily before breakfast. 08/11/18  Yes Brunetta Jeans, PA-C  metoprolol tartrate (LOPRESSOR) 25 MG tablet Take 12.5 mg by mouth 2 (two) times daily.    Yes [provider]  tamsulosin (FLOMAX) 0.4 MG CAPS capsule Take 1 capsule (0.4 mg total) by mouth daily. 08/11/18  Yes Brunetta Jeans, PA-C   No Known Allergies  Social History   Tobacco Use  . Smoking status: Never Smoker  . Smokeless tobacco: Never Used  Substance Use Topics  . Alcohol use: Yes    Frequency: Never    Comment: occasional    History reviewed. No pertinent family history.   Review of Systems  Positive ROS: neg  All other systems have been reviewed and were otherwise negative with the exception of those mentioned in the HPI and as above.  Objective: Vital signs in last 24 hours: Temp:  [97.6 F (36.4 C)] 97.6 F (36.4 C) (02/24 0536) Pulse Rate:  [76-90] 76 (02/24 0617) Resp:  [20] 20 (02/24 0536) BP: (157)/(88) 157/88 (02/24 0617) SpO2:  [99 %] 99 % (02/24 0536) Weight:  [88.5 kg] 88.5 kg (02/24 0536)  General Appearance: Alert, cooperative, no distress, appears  stated age Head: Normocephalic, without obvious abnormality, atraumatic Eyes: PERRL, conjunctiva/corneas clear, EOM's intact    Neck: Supple, symmetrical, trachea midline Back: Symmetric, no curvature, ROM normal, no CVA tenderness Lungs:  respirations unlabored Heart: Regular rate and rhythm Abdomen: Soft, non-tender Extremities: Extremities normal, atraumatic, no cyanosis or edema Pulses: 2+ and symmetric all extremities Skin: Skin color, texture, turgor normal, no rashes or lesions  NEUROLOGIC:   Mental status: Alert and oriented x4,  no aphasia, good attention span, fund of knowledge, and  memory Motor Exam - grossly normal Sensory Exam - grossly normal Reflexes: trace Coordination - grossly normal Gait - grossly normal Balance - grossly normal Cranial Nerves: I: smell Not tested  II: visual acuity  OS: nl    OD: nl  II: visual fields Full to confrontation  II: pupils Equal, round, reactive to light  III,VII: ptosis None  III,IV,VI: extraocular muscles  Full ROM  V: mastication Normal  V: facial light touch sensation  Normal  V,VII: corneal reflex  Present  VII: facial muscle function - upper  Normal  VII: facial muscle function - lower Normal  VIII: hearing Not tested  IX: soft palate elevation  Normal  IX,X: gag reflex Present  XI: trapezius strength  5/5  XI: sternocleidomastoid strength 5/5  XI: neck flexion strength  5/5  XII: tongue strength  Normal    Data Review Lab Results  Component Value Date   WBC 10.1 08/25/2018   HGB 14.0 08/25/2018   HCT 42.2 08/25/2018   MCV 95.0 08/25/2018   PLT 181 08/25/2018   Lab Results  Component Value Date   NA 142 08/25/2018   K 3.7 08/25/2018   CL 105 08/25/2018   CO2 26 08/25/2018   BUN 27 (H) 08/25/2018   CREATININE 1.20 08/25/2018   GLUCOSE 101 (H) 08/25/2018   Lab Results  Component Value Date   INR 1.07 06/01/2017    Assessment/Plan:  Estimated body mass index is 27.2 kg/m as calculated from the following:   Height as of this encounter: 5\' 11"  (1.803 m).   Weight as of this encounter: 88.5 kg. Patient admitted for LL L2-3. Patient has failed a reasonable attempt at conservative therapy.  I explained the condition and procedure to the patient and answered any questions.  Patient wishes to proceed with procedure as planned. Understands risks/ benefits and typical outcomes of procedure.   TARIK TEIXEIRA 09/04/2018 7:26 AM

## 2018-09-04 NOTE — Discharge Summary (Signed)
Physician Discharge Summary  Patient ID: Keith [name]. MRN: [id number] DOB/AGE: [birth year] 78 y.o.  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 The patient tolerated the procedure well and was taken to the recovery room and then to the floor in stable condition. The hospital course was routine. There were no complications. The wound remained clean dry and intact. Pt had appropriate back soreness. No complaints of leg pain or new N/T/W. The patient remained afebrile with stable vital signs, and tolerated a regular diet. The patient continued to increase activities, and pain was well controlled with oral pain medications.   Consults: None  Significant Diagnostic Studies:  Results for orders placed or performed during the hospital encounter of 09/04/18  Protime-INR  Result Value Ref Range   Prothrombin Time 14.2 11.4 - 15.2 seconds   INR 1.11     Chest 2 View  Result Date: 08/25/2018 CLINICAL DATA:  Preoperative study. EXAM: CHEST - 2 VIEW COMPARISON:  June 01, 2017 FINDINGS: The heart size and mediastinal contours are within normal limits. Both lungs are clear. The visualized skeletal structures are unremarkable. IMPRESSION: No active cardiopulmonary disease. Electronically Signed   By: Dorise Bullion III M.D   On: 08/25/2018 20:28   Dg Lumbar Spine 2-3 Views  Result Date: 09/04/2018 CLINICAL DATA:  Intraoperative localization EXAM: LUMBAR SPINE - 2-3 VIEW COMPARISON:  08/17/2018 lumbar spine radiographs FINDINGS: Posterior approach surgical marking device terminates over the anterior aspect of the L2 spinous process. 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 IMPRESSION: Posterior  approach surgical marking device terminates over the anterior aspect of the L2 spinous process. Electronically Signed   By: Ilona Sorrel M.D.   On: 09/04/2018 09:48    Antibiotics:  Anti-infectives (From admission, onward)   Start     Dose/Rate Route Frequency Ordered Stop   09/04/18 1030  ceFAZolin (ANCEF) IVPB 2g/100 mL premix     2 g 200 mL/hr over 30 Minutes Intravenous Every 8 hours 09/04/18 1018 09/05/18 0229   09/04/18 0834  bacitracin 50,000 Units in sodium chloride 0.9 % 500 mL irrigation  Status:  Discontinued       As needed 09/04/18 0834 09/04/18 0855   09/04/18 0645  ceFAZolin (ANCEF) IVPB 2g/100 mL premix     2 g 200 mL/hr over 30 Minutes Intravenous On call to O.R. 09/04/18 7209 09/04/18 0745      Discharge Exam: Blood pressure (!) 153/90, pulse 74, temperature 97.7 F (36.5 C), temperature source Oral, resp. rate 16, height 5\' 11"  (1.803 m), weight 88.5 kg, SpO2 99 %. Neurologic: Grossly normal Ambulating voiding well  Discharge Medications:   Allergies as of 09/04/2018   No Known Allergies     Medication List    TAKE these medications   acetaminophen 500 MG tablet Commonly known as:  TYLENOL Take 500 mg by mouth daily.   ELIQUIS 5 MG Tabs tablet Generic drug:  apixaban Take 5 mg by mouth 2 (two) times daily.   furosemide 20 MG tablet Commonly known as:  LASIX Take 1 tablet (20 mg total) by mouth daily. What changed:    when to take this  reasons to take this   GLUCOSAMINE MSM COMPLEX Tabs Take 1 tablet by mouth 2 (  two) times daily.   HYDROcodone-acetaminophen 7.5-325 MG tablet Commonly known as:  NORCO Take 1 tablet by mouth every 6 (six) hours.   ibuprofen 200 MG tablet Commonly known as:  ADVIL,MOTRIN Take 400 mg by mouth daily.   levothyroxine 50 MCG tablet Commonly known as:  SYNTHROID Take 1 tablet (50 mcg total) by mouth daily before breakfast.   methocarbamol 500 MG tablet Commonly known as:  ROBAXIN Take 1 tablet (500 mg total)  by mouth every 6 (six) hours as needed for muscle spasms.   metoprolol tartrate 25 MG tablet Commonly known as:  LOPRESSOR Take 12.5 mg by mouth 2 (two) times daily.   tamsulosin 0.4 MG Caps capsule Commonly known as:  FLOMAX Take 1 capsule (0.4 mg total) by mouth daily.       Disposition: home   Final Dx: lumbar laminectomy L2-3  Discharge Instructions     Remove dressing in 72 hours   Complete by:  As directed    Diet - low sodium heart healthy   Complete by:  As directed    Increase activity slowly   Complete by:  As directed    Lifting restrictions   Complete by:  As directed    No lifting more than 8 lbs         Signed: Ocie Cornfield Kemon Devincenzi 09/04/2018, 1:58 PM

## 2018-09-04 NOTE — Anesthesia Procedure Notes (Signed)
Procedure Name: Intubation Date/Time: 09/04/2018 7:40 AM Performed by: Lieutenant Diego, CRNA Pre-anesthesia Checklist: Patient identified, Emergency Drugs available, Suction available and Patient being monitored Patient Re-evaluated:Patient Re-evaluated prior to induction Oxygen Delivery Method: Circle system utilized Preoxygenation: Pre-oxygenation with 100% oxygen Induction Type: IV induction Ventilation: Mask ventilation without difficulty Laryngoscope Size: Miller and 2 Grade View: Grade I Tube type: Oral Tube size: 7.5 mm Number of attempts: 1 Airway Equipment and Method: Stylet Placement Confirmation: ETT inserted through vocal cords under direct vision,  positive ETCO2 and breath sounds checked- equal and bilateral Secured at: 23 cm Tube secured with: Tape Dental Injury: Teeth and Oropharynx as per pre-operative assessment

## 2018-09-04 NOTE — Op Note (Signed)
09/04/2018  8:51 AM  PATIENT:  Keith [name].  78 y.o. 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 Imaging showed very spinal stenosis L2-3. The patient tried conservative measures without relief. Pain was debilitating. Recommended L2-3 laminectomy. Patient understood the risks, benefits, and alternatives and potential outcomes and wished to proceed.  PROCEDURE DETAILS: The patient was taken to the operating room and after induction of adequate generalized endotracheal anesthesia, the patient was rolled into the prone position on the Wilson frame and all pressure points were padded. The lumbar region was cleaned and then prepped with DuraPrep and draped in the usual sterile fashion. 5 cc of local anesthesia was injected and then a dorsal midline incision was made and carried down to the lumbo sacral fascia. The fascia was opened and the paraspinous musculature was taken down in a subperiosteal fashion to expose L2-3 bilaterally. Intraoperative x-ray confirmed my level, and then I used a combination of the high-speed drill and the Kerrison punches to perform a laminectomy, medial facetectomy, and foraminotomy at L2-3. The underlying yellow ligament was opened and removed in a piecemeal fashion to expose the underlying dura and exiting nerve root bilaterally. I undercut the lateral recess and dissected down until I was medial to and distal to the pedicle. The nerve root was well decompressed on both sides.  I then palpated with a coronary dilator along the nerve root and into the foramen to assure adequate  decompression. I felt no more compression of the nerve root. I irrigated with saline solution containing bacitracin. Achieved hemostasis with bipolar cautery, lined the dura with Gelfoam, and then closed the fascia with 0 Vicryl. I closed the subcutaneous tissues with 2-0 Vicryl and the subcuticular tissues with 3-0 Vicryl. The skin was then closed with benzoin and Steri-Strips. The drapes were removed, a sterile dressing was applied. The patient was awakened from general anesthesia and transferred to the recovery room in stable condition. At the end of the procedure all sponge, needle and instrument counts were correct.    PLAN OF CARE: Admit for overnight observation  PATIENT DISPOSITION:  PACU - hemodynamically stable.   Delay start of Pharmacological VTE agent (>24hrs) due to surgical blood loss or risk of bleeding:  yes

## 2018-09-04 NOTE — Discharge Instructions (Signed)
Wound Care Keep incision covered and dry for 3 days    You may remove outer bandage after 3 days. Do not put any creams, lotions, or ointments on incision. Leave steri-strips on back.  They will fall off by themselves. Activity Walk each and every day, increasing distance each day. No lifting greater than 8 lbs.  Avoid excessive bending motion. No driving for 2 weeks; may ride as a passenger locally.  Diet Resume your normal soft diet.  Return to Work Will be discussed at you follow up appointment. Call Your Doctor If Any of These Occur Redness, drainage, or swelling at the wound.  Temperature greater than 101 degrees. Severe pain not relieved by pain medication. Increased difficulty swallowing.  Incision starts to come apart. Follow Up Appt Call today for appointment in 1-2 weeks ([phone]) or for problems.  If you have any hardware placed in your spine, you will need an x-ray before your appointment.

## 2018-09-05 ENCOUNTER — Encounter (HOSPITAL_COMMUNITY): Payer: Self-pay | Admitting: Neurological Surgery

## 2018-09-29 MED FILL — METHOCARBAMOL 500 MG TABLET: 500 | 16 days supply | Qty: 50 | Fill #0

## 2018-10-24 DIAGNOSIS — M5416 Radiculopathy, lumbar region: Secondary | ICD-10-CM | POA: Diagnosis not present

## 2018-10-24 DIAGNOSIS — M48061 Spinal stenosis, lumbar region without neurogenic claudication: Secondary | ICD-10-CM | POA: Diagnosis not present

## 2018-10-24 DIAGNOSIS — M961 Postlaminectomy syndrome, not elsewhere classified: Secondary | ICD-10-CM | POA: Diagnosis not present

## 2018-11-08 DIAGNOSIS — Z6828 Body mass index (BMI) 28.0-28.9, adult: Secondary | ICD-10-CM | POA: Diagnosis not present

## 2018-11-08 DIAGNOSIS — I10 Essential (primary) hypertension: Secondary | ICD-10-CM | POA: Diagnosis not present

## 2018-11-08 DIAGNOSIS — M5416 Radiculopathy, lumbar region: Secondary | ICD-10-CM | POA: Diagnosis not present

## 2018-11-17 ENCOUNTER — Other Ambulatory Visit: Payer: Self-pay | Admitting: Physician Assistant

## 2018-11-17 MED FILL — ELIQUIS 5 MG TABLET: 5 | 90 days supply | Qty: 180 | Fill #1

## 2018-11-17 MED FILL — LEVOTHYROXINE 50 MCG TABLET: 50 | 90 days supply | Qty: 90 | Fill #0

## 2018-11-17 MED FILL — TAMSULOSIN HCL 0.4 MG CAP: 0.4 | 90 days supply | Qty: 90 | Fill #0

## 2018-11-17 MED FILL — METOPROLOL TARTRATE 25 MG T: 25 | 90 days supply | Qty: 180 | Fill #1

## 2018-11-20 ENCOUNTER — Encounter: Payer: Self-pay | Admitting: Emergency Medicine

## 2018-11-24 DIAGNOSIS — M47816 Spondylosis without myelopathy or radiculopathy, lumbar region: Secondary | ICD-10-CM | POA: Diagnosis not present

## 2018-11-24 DIAGNOSIS — I10 Essential (primary) hypertension: Secondary | ICD-10-CM | POA: Diagnosis not present

## 2018-11-24 DIAGNOSIS — Z6828 Body mass index (BMI) 28.0-28.9, adult: Secondary | ICD-10-CM | POA: Diagnosis not present

## 2018-12-19 DIAGNOSIS — M47816 Spondylosis without myelopathy or radiculopathy, lumbar region: Secondary | ICD-10-CM | POA: Diagnosis not present

## 2019-01-17 DIAGNOSIS — R209 Unspecified disturbances of skin sensation: Secondary | ICD-10-CM | POA: Diagnosis not present

## 2019-01-17 DIAGNOSIS — L820 Inflamed seborrheic keratosis: Secondary | ICD-10-CM | POA: Diagnosis not present

## 2019-01-17 DIAGNOSIS — Z789 Other specified health status: Secondary | ICD-10-CM | POA: Diagnosis not present

## 2019-01-17 DIAGNOSIS — L298 Other pruritus: Secondary | ICD-10-CM | POA: Diagnosis not present

## 2019-01-17 DIAGNOSIS — D485 Neoplasm of uncertain behavior of skin: Secondary | ICD-10-CM | POA: Diagnosis not present

## 2019-01-17 DIAGNOSIS — R238 Other skin changes: Secondary | ICD-10-CM | POA: Diagnosis not present

## 2019-01-17 DIAGNOSIS — X32XXXA Exposure to sunlight, initial encounter: Secondary | ICD-10-CM | POA: Diagnosis not present

## 2019-01-17 DIAGNOSIS — L570 Actinic keratosis: Secondary | ICD-10-CM | POA: Diagnosis not present

## 2019-01-22 MED FILL — FLUOROURACIL 5 % CREA: 5 | 10 days supply | Qty: 40 | Fill #0

## 2019-02-20 ENCOUNTER — Encounter: Payer: Self-pay | Admitting: Emergency Medicine

## 2019-02-20 ENCOUNTER — Other Ambulatory Visit: Payer: Self-pay | Admitting: Physician Assistant

## 2019-02-20 MED FILL — LEVOTHYROXINE 50 MCG TABLET: 50 | 30 days supply | Qty: 30 | Fill #0

## 2019-02-20 MED FILL — FLUOROURACIL 5 % CREA: 5 | 10 days supply | Qty: 40 | Fill #1

## 2019-02-21 DIAGNOSIS — M47816 Spondylosis without myelopathy or radiculopathy, lumbar region: Secondary | ICD-10-CM | POA: Diagnosis not present

## 2019-03-26 DIAGNOSIS — Z6829 Body mass index (BMI) 29.0-29.9, adult: Secondary | ICD-10-CM | POA: Diagnosis not present

## 2019-03-26 DIAGNOSIS — M5416 Radiculopathy, lumbar region: Secondary | ICD-10-CM | POA: Diagnosis not present

## 2019-03-26 DIAGNOSIS — R030 Elevated blood-pressure reading, without diagnosis of hypertension: Secondary | ICD-10-CM | POA: Diagnosis not present

## 2019-03-26 DIAGNOSIS — M48061 Spinal stenosis, lumbar region without neurogenic claudication: Secondary | ICD-10-CM | POA: Diagnosis not present

## 2019-04-04 DIAGNOSIS — M5416 Radiculopathy, lumbar region: Secondary | ICD-10-CM | POA: Diagnosis not present

## 2019-04-04 DIAGNOSIS — M48061 Spinal stenosis, lumbar region without neurogenic claudication: Secondary | ICD-10-CM | POA: Diagnosis not present

## 2019-04-24 DIAGNOSIS — Z6829 Body mass index (BMI) 29.0-29.9, adult: Secondary | ICD-10-CM | POA: Diagnosis not present

## 2019-04-24 DIAGNOSIS — M5416 Radiculopathy, lumbar region: Secondary | ICD-10-CM | POA: Diagnosis not present

## 2019-04-24 DIAGNOSIS — I10 Essential (primary) hypertension: Secondary | ICD-10-CM | POA: Diagnosis not present

## 2019-04-26 DIAGNOSIS — M5416 Radiculopathy, lumbar region: Secondary | ICD-10-CM | POA: Diagnosis not present

## 2019-05-01 DIAGNOSIS — M4805 Spinal stenosis, thoracolumbar region: Secondary | ICD-10-CM | POA: Diagnosis not present

## 2019-05-01 DIAGNOSIS — M5127 Other intervertebral disc displacement, lumbosacral region: Secondary | ICD-10-CM | POA: Diagnosis not present

## 2019-05-01 DIAGNOSIS — M5416 Radiculopathy, lumbar region: Secondary | ICD-10-CM | POA: Diagnosis not present

## 2019-05-01 DIAGNOSIS — M5137 Other intervertebral disc degeneration, lumbosacral region: Secondary | ICD-10-CM | POA: Diagnosis not present

## 2019-05-15 ENCOUNTER — Encounter: Payer: Self-pay | Admitting: Physician Assistant

## 2019-05-15 DIAGNOSIS — Z01810 Encounter for preprocedural cardiovascular examination: Secondary | ICD-10-CM

## 2019-05-15 DIAGNOSIS — I480 Paroxysmal atrial fibrillation: Secondary | ICD-10-CM

## 2019-05-15 DIAGNOSIS — M4316 Spondylolisthesis, lumbar region: Secondary | ICD-10-CM | POA: Diagnosis not present

## 2019-06-11 DIAGNOSIS — M4310 Spondylolisthesis, site unspecified: Secondary | ICD-10-CM | POA: Diagnosis not present

## 2019-06-11 NOTE — Pre-Procedure Instructions (Signed)
[place], [place] [address] [place] [address] Phone: [phone] Fax: [phone]      Your procedure is scheduled on 06-15-19  Report to [facility] Main Entrance "A" at 1145 A.M., and check in at the Admitting office.  Call this number if you have problems the morning of surgery:  [phone]  Call [phone] if you have any questions prior to your surgery date Monday-Friday 8am-4pm    Remember:  Do not eat or drink after midnight the night before your surgery  Take these medicines the morning of surgery with A SIP OF WATER : diphenhydrAMINE (BENADRYL) as needed levothyroxine (SYNTHROID)  Follow your surgeon's instructions on when to stop ELIQUIS.  If no instructions were given by your surgeon then you will need to call the office to get those instructions.     7 days prior to surgery STOP taking any Aspirin (unless otherwise instructed by your surgeon), Aleve, Naproxen, Ibuprofen, Motrin, Advil, Goody's, BC's, all herbal medications, fish oil, and all vitamins.    The Morning of Surgery  Do not wear jewelry, make-up or nail polish.  Do not wear lotions, powders, or perfumes/colognes, or deodorant    Men may shave face and neck.  Do not bring valuables to the hospital.  [facility] is not responsible for any belongings or valuables.  If you are a smoker, DO NOT Smoke 24 hours prior to surgery  If you wear a CPAP at night please bring your mask, tubing, and machine the morning of surgery   Remember that you must have someone to transport you home after your surgery, and remain with you for 24 hours if you are discharged the same day.   Please bring cases for contacts, glasses, hearing aids, dentures or bridgework because it cannot be worn into surgery.    Leave your suitcase in the car.  After surgery it may be brought to your room.  For patients admitted to the hospital, discharge time will  be determined by your treatment team.  Patients discharged the day of surgery will not be allowed to drive home.    Special instructions:   [facility]- Preparing For Surgery  Before surgery, you can play an important role. Because skin is not sterile, your skin needs to be as free of germs as possible. You can reduce the number of germs on your skin by washing with CHG (chlorahexidine gluconate) Soap before surgery.  CHG is an antiseptic cleaner which kills germs and bonds with the skin to continue killing germs even after washing.    Oral Hygiene is also important to reduce your risk of infection.  Remember - BRUSH YOUR TEETH THE MORNING OF SURGERY WITH YOUR REGULAR TOOTHPASTE  Please do not use if you have an allergy to CHG or antibacterial soaps. If your skin becomes reddened/irritated stop using the CHG.  Do not shave (including legs and underarms) for at least 48 hours prior to first CHG shower. It is OK to shave your face.  Please follow these instructions carefully.   1. Shower the NIGHT BEFORE SURGERY and the MORNING OF SURGERY with CHG Soap.   2. If you chose to wash your hair, wash your hair first as usual with your normal shampoo.  3. After you shampoo, rinse your hair and body thoroughly to remove the shampoo.  4. Use CHG as you would any other liquid soap. You can apply CHG directly to the skin  and wash gently with a scrungie or a clean washcloth.   5. Apply the CHG Soap to your body ONLY FROM THE NECK DOWN.  Do not use on open wounds or open sores. Avoid contact with your eyes, ears, mouth and genitals (private parts). Wash Face and genitals (private parts)  with your normal soap.   6. Wash thoroughly, paying special attention to the area where your surgery will be performed.  7. Thoroughly rinse your body with warm water from the neck down.  8. DO NOT shower/wash with your normal soap after using and rinsing off the CHG Soap.  9. Pat yourself dry with a CLEAN  TOWEL.  10. Wear CLEAN PAJAMAS to bed the night before surgery, wear comfortable clothes the morning of surgery  11. Place CLEAN SHEETS on your bed the night of your first shower and DO NOT SLEEP WITH PETS.    Day of Surgery:  Please shower the morning of surgery with the CHG soap Do not apply any deodorants/lotions. Please wear clean clothes to the hospital/surgery center.   Remember to brush your teeth WITH YOUR REGULAR TOOTHPASTE.   Please read over the  fact sheets that you were given.

## 2019-06-12 ENCOUNTER — Encounter (HOSPITAL_COMMUNITY): Payer: Self-pay

## 2019-06-12 ENCOUNTER — Encounter (HOSPITAL_COMMUNITY)
Admission: RE | Admit: 2019-06-12 | Discharge: 2019-06-12 | Disposition: A | Discharge status: Home / Self Care | Payer: 59 | Source: Ambulatory Visit | Attending: Neurological Surgery | Admitting: Neurological Surgery

## 2019-06-12 ENCOUNTER — Other Ambulatory Visit (HOSPITAL_COMMUNITY)
Admission: RE | Admit: 2019-06-12 | Discharge: 2019-06-12 | Disposition: A | Discharge status: Home / Self Care | Payer: 59 | Source: Ambulatory Visit | Attending: Neurological Surgery | Admitting: Neurological Surgery

## 2019-06-12 ENCOUNTER — Other Ambulatory Visit: Payer: Self-pay

## 2019-06-12 DIAGNOSIS — K219 Gastro-esophageal reflux disease without esophagitis: Secondary | ICD-10-CM | POA: Diagnosis not present

## 2019-06-12 DIAGNOSIS — M48061 Spinal stenosis, lumbar region without neurogenic claudication: Secondary | ICD-10-CM | POA: Diagnosis not present

## 2019-06-12 DIAGNOSIS — I4821 Permanent atrial fibrillation: Secondary | ICD-10-CM | POA: Insufficient documentation

## 2019-06-12 DIAGNOSIS — M4316 Spondylolisthesis, lumbar region: Secondary | ICD-10-CM | POA: Diagnosis not present

## 2019-06-12 DIAGNOSIS — Z79899 Other long term (current) drug therapy: Secondary | ICD-10-CM | POA: Diagnosis not present

## 2019-06-12 DIAGNOSIS — Z20828 Contact with and (suspected) exposure to other viral communicable diseases: Secondary | ICD-10-CM | POA: Diagnosis not present

## 2019-06-12 DIAGNOSIS — M1990 Unspecified osteoarthritis, unspecified site: Secondary | ICD-10-CM | POA: Diagnosis not present

## 2019-06-12 DIAGNOSIS — Z79890 Hormone replacement therapy (postmenopausal): Secondary | ICD-10-CM | POA: Diagnosis not present

## 2019-06-12 DIAGNOSIS — I4891 Unspecified atrial fibrillation: Secondary | ICD-10-CM | POA: Diagnosis not present

## 2019-06-12 DIAGNOSIS — Z01818 Encounter for other preprocedural examination: Secondary | ICD-10-CM | POA: Insufficient documentation

## 2019-06-12 DIAGNOSIS — Z7901 Long term (current) use of anticoagulants: Secondary | ICD-10-CM | POA: Insufficient documentation

## 2019-06-12 LAB — CBC
HCT: 45.6 % (ref 39.0–52.0)
Hemoglobin: 14.9 g/dL (ref 13.0–17.0)
MCH: 31.4 pg (ref 26.0–34.0)
MCHC: 32.7 g/dL (ref 30.0–36.0)
MCV: 96 fL (ref 80.0–100.0)
Platelets: 196 10*3/uL (ref 150–400)
RBC: 4.75 MIL/uL (ref 4.22–5.81)
RDW: 14.2 % (ref 11.5–15.5)
WBC: 11.3 10*3/uL — ABNORMAL HIGH (ref 4.0–10.5)
nRBC: 0 % (ref 0.0–0.2)

## 2019-06-12 LAB — TYPE AND SCREEN
ABO/RH(D): O POS
Antibody Screen: NEGATIVE

## 2019-06-12 LAB — BASIC METABOLIC PANEL
Anion gap: 13 (ref 5–15)
BUN: 24 mg/dL — ABNORMAL HIGH (ref 8–23)
CO2: 25 mmol/L (ref 22–32)
Calcium: 8.9 mg/dL (ref 8.9–10.3)
Chloride: 104 mmol/L (ref 98–111)
Creatinine, Ser: 1.2 mg/dL (ref 0.61–1.24)
GFR calc Af Amer: >60 mL/min (ref >60)
GFR calc non Af Amer: 57 mL/min — ABNORMAL LOW (ref >60)
Glucose, Bld: 110 mg/dL — ABNORMAL HIGH (ref 70–99)
Potassium: 4 mmol/L (ref 3.5–5.1)
Sodium: 142 mmol/L (ref 135–145)

## 2019-06-12 LAB — PROTIME-INR
INR: 1.1 (ref 0.8–1.2)
Prothrombin Time: 13.7 seconds (ref 11.4–15.2)

## 2019-06-12 LAB — SURGICAL PCR SCREEN
MRSA, PCR: NEGATIVE
Staphylococcus aureus: NEGATIVE

## 2019-06-12 LAB — ABO/RH: ABO/RH(D): O POS

## 2019-06-12 LAB — SARS CORONAVIRUS 2 (TAT 6-24 HRS): SARS Coronavirus 2: NEGATIVE

## 2019-06-12 NOTE — Progress Notes (Signed)
PCP - PA Raiford Noble  Cardiologist - Dr. Cline Cools- C.C. (CE) 05/29/2019  Chest x-ray - 08/25/2018 (E)  EKG - 06/12/2019  Stress Test - Denies  ECHO - 03/28/18 (CE)  Cardiac Cath - 02/08/13 (CE)  AICD-na PM-na LOOP-na  Sleep Study - Denies CPAP - None  LABS- 06/12/2019: CBC, BMP, PT, T/S, PCR, COVID  ASA- Denies Eliquis- LD- 11/28  ERAS- No  HA1C- Denies Fasting Blood Sugar -  Checks Blood Sugar _____ times a day  Anesthesia- Yes-cardiac history/C.C./previous consult  Pt denies having chest pain, sob, or fever at this time. All instructions explained to the pt, with a verbal understanding of the material. Pt agrees to go over the instructions while at home for a better understanding. Pt also instructed to self quarantine after being tested for COVID-19. The opportunity to ask questions was provided.   Coronavirus Screening  Have you experienced the following symptoms:  Cough yes/no: No Fever (>100.34F)  yes/no: No Runny nose yes/no: No Sore throat yes/no: No Difficulty breathing/shortness of breath  yes/no: No  Have you or a family member traveled in the last 14 days and where? yes/no: No   If the patient indicates "YES" to the above questions, their PAT will be rescheduled to limit the exposure to others and, the surgeon will be notified. THE PATIENT WILL NEED TO BE ASYMPTOMATIC FOR 14 DAYS.   If the patient is not experiencing any of these symptoms, the PAT nurse will instruct them to NOT bring anyone with them to their appointment since they may have these symptoms or traveled as well.   Please remind your patients and families that hospital visitation restrictions are in effect and the importance of the restrictions.

## 2019-06-13 ENCOUNTER — Ambulatory Visit: Payer: Medicare Other | Admitting: Cardiology

## 2019-06-13 ENCOUNTER — Other Ambulatory Visit: Payer: Self-pay | Admitting: *Deleted

## 2019-06-13 NOTE — Patient Outreach (Signed)
[facility]) Care Management  06/13/2019  Onnie Brenda. 20-Aug-1940 [id number]   Preoperative Screening Call  Referral received: 06/11/19 Surgery/procedure date: 06/13/19 Insurance: [place]   Subjective:  Initial successful telephone call to patient's preferred number in order to complete preoperative screening. 2 HIPAA identifiers verified. Discussed purpose of preoperative call. Patient voices understanding and agrees to call. He states he  understands the reason for the surgery reports that this is his 4th back surgery and the expected time of arrival. He s completed  preoperative testing on 06/11/19 /20 and has no additional questions. He states being off eliquis in preparation for surgery as instructed  He expects to be in the hospital 1 day.  He states that his wife is a Software engineer with [employer] and has information on accessing benefits,  He states his wife will complete necessary paperwork for medical leave if needed, he is unsure if they have hospital indemnity but he will follow up with her, explained if they do have plan she wil need to file a claim after surgery he again states that she has contact information to access all  benefits. He will have 24/7 care at home provided by his wife   to assist in his recovery. He states has advanced directive in place.  He denies having any preoperative questions at this time.   He agrees to a post hospital discharge transition of care call.    Objective:  Per chart review, patient scheduled for TLIF L4-L5 on 06/15/19 at [facility]. He completed pre-op testing on 06/11/19   Assessment: Preoperative call completed, no preoperative needs identified.   Plan: RNCM will call patient for transition of care outreach within 72 hours of hospital discharge notification.   Joylene Draft, RN, [place] Management Coordinator  [phone]- Mobile [phone]- Toll Free Main  Office

## 2019-06-13 NOTE — Anesthesia Preprocedure Evaluation (Addendum)
Anesthesia Evaluation  Patient identified by MRN, date of birth, ID band Patient awake    Reviewed: Allergy & Precautions, NPO status , Patient's Chart, lab work & pertinent test results  Airway Mallampati: III  TM Distance: >3 FB Neck ROM: Full    Dental no notable dental hx.    Pulmonary neg pulmonary ROS,    Pulmonary exam normal breath sounds clear to auscultation       Cardiovascular Normal cardiovascular exam+ dysrhythmias Atrial Fibrillation  Rhythm:Regular Rate:Normal  ECG: a-fib, rate 61.  S/p AVR  Sees cardiologist in [place]   Neuro/Psych CVA, No Residual Symptoms negative psych ROS   GI/Hepatic Neg liver ROS, GERD  ,  Endo/Other  Hypothyroidism   Renal/GU negative Renal ROS     Musculoskeletal  (+) Arthritis ,   Abdominal   Peds  Hematology negative hematology ROS (+)   Anesthesia Other Findings Spondylolisthesis  Reproductive/Obstetrics                            Anesthesia Physical Anesthesia Plan  ASA: III  Anesthesia Plan: General   Post-op Pain Management:    Induction: Intravenous  PONV Risk Score and Plan: 3 and Ondansetron, Dexamethasone and Treatment may vary due to age or medical condition  Airway Management Planned: Oral ETT  Additional Equipment:   Intra-op Plan:   Post-operative Plan: Extubation in OR  Informed Consent: I have reviewed the patients History and Physical, chart, labs and discussed the procedure including the risks, benefits and alternatives for the proposed anesthesia with the patient or authorized representative who has indicated his/her understanding and acceptance.     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 [place] locally. His primary cardiologist Dr. Cline Cools is in [place]. Clearance on pt chart dated 05/28/19 states pt is low risk and can stop eliquis 2d prior to surgery.  Preop labs reviewed, WNL.   EKG 06/12/19: Atrial fibrillation. Vent rate 61. Minimal voltage criteria for LVH, may be normal variant ( R in aVL )  Echo 03/28/18 ([facility]): Result Narrative:  Normal LV size with normal LV systolic function, estimated LVEF 60-65%  Mild fairly concentric LVH  Moderate to severe biatrial enlargement  Well-seated normal functioning bioprosthetic aortic valve with normal  Doppler velocities  Trivial to mild mitral insufficiency, trivial to mild tricuspid  insufficiency  Normal estimated right heart pressures  No pericardial effusion  Proximal ascending aorta upper normal at 3.9 cm  The patient was in atrial fibrillation throughout the study  No significant change in comparison to the prior echocardiogram)      Anesthesia Quick Evaluation

## 2019-06-13 NOTE — Progress Notes (Signed)
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 He lives on a farm locally. His primary cardiologist Dr. Cline Cools is in [place]. Clearance on pt chart dated 05/28/19 states pt is low risk and can stop eliquis 2d prior to surgery.  Preop labs reviewed, WNL.   EKG 06/12/19: Atrial fibrillation. Vent rate 61. Minimal voltage criteria for LVH, may be normal variant ( R in aVL )  Echo 03/28/18 ([facility]): Result Narrative:  Normal LV size with normal LV systolic function, estimated LVEF 60-65%  Mild fairly concentric LVH  Moderate to severe biatrial enlargement  Well-seated normal functioning bioprosthetic aortic valve with normal  Doppler velocities  Trivial to mild mitral insufficiency, trivial to mild tricuspid  insufficiency  Normal estimated right heart pressures  No pericardial effusion  Proximal ascending aorta upper normal at 3.9 cm  The patient was in atrial fibrillation throughout the study  No significant change in comparison to the prior echocardiogram   Wynonia Musty [facility] Short Stay Center/Anesthesiology Phone [phone] 06/13/2019 1:48 PM

## 2019-06-15 ENCOUNTER — Encounter (HOSPITAL_COMMUNITY): Payer: Self-pay

## 2019-06-15 ENCOUNTER — Inpatient Hospital Stay (HOSPITAL_COMMUNITY): Payer: 59 | Admitting: Physician Assistant

## 2019-06-15 ENCOUNTER — Inpatient Hospital Stay (HOSPITAL_COMMUNITY): Payer: 59

## 2019-06-15 ENCOUNTER — Inpatient Hospital Stay (HOSPITAL_COMMUNITY): Payer: 59 | Admitting: Certified Registered Nurse Anesthetist

## 2019-06-15 ENCOUNTER — Inpatient Hospital Stay (HOSPITAL_COMMUNITY)
Admission: RE | Admit: 2019-06-15 | Discharge: 2019-06-16 | DRG: 460 | Disposition: A | Discharge status: Home / Self Care | Payer: 59 | Attending: Neurological Surgery | Admitting: Neurological Surgery

## 2019-06-15 ENCOUNTER — Other Ambulatory Visit: Payer: Self-pay

## 2019-06-15 ENCOUNTER — Encounter (HOSPITAL_COMMUNITY)
Admission: RE | Disposition: A | Discharge status: Home / Self Care | Payer: Self-pay | Source: Home / Self Care | Attending: Neurological Surgery

## 2019-06-15 DIAGNOSIS — M1990 Unspecified osteoarthritis, unspecified site: Secondary | ICD-10-CM | POA: Diagnosis present

## 2019-06-15 DIAGNOSIS — Z953 Presence of xenogenic heart valve: Secondary | ICD-10-CM

## 2019-06-15 DIAGNOSIS — Z91048 Other nonmedicinal substance allergy status: Secondary | ICD-10-CM | POA: Diagnosis not present

## 2019-06-15 DIAGNOSIS — M48061 Spinal stenosis, lumbar region without neurogenic claudication: Secondary | ICD-10-CM | POA: Diagnosis present

## 2019-06-15 DIAGNOSIS — M4326 Fusion of spine, lumbar region: Secondary | ICD-10-CM | POA: Diagnosis not present

## 2019-06-15 DIAGNOSIS — Z79899 Other long term (current) drug therapy: Secondary | ICD-10-CM

## 2019-06-15 DIAGNOSIS — M4316 Spondylolisthesis, lumbar region: Secondary | ICD-10-CM | POA: Diagnosis present

## 2019-06-15 DIAGNOSIS — Z79890 Hormone replacement therapy (postmenopausal): Secondary | ICD-10-CM | POA: Diagnosis not present

## 2019-06-15 DIAGNOSIS — K219 Gastro-esophageal reflux disease without esophagitis: Secondary | ICD-10-CM | POA: Diagnosis present

## 2019-06-15 DIAGNOSIS — I10 Essential (primary) hypertension: Secondary | ICD-10-CM | POA: Diagnosis not present

## 2019-06-15 DIAGNOSIS — Z7901 Long term (current) use of anticoagulants: Secondary | ICD-10-CM

## 2019-06-15 DIAGNOSIS — Z85828 Personal history of other malignant neoplasm of skin: Secondary | ICD-10-CM | POA: Diagnosis not present

## 2019-06-15 DIAGNOSIS — Z8673 Personal history of transient ischemic attack (TIA), and cerebral infarction without residual deficits: Secondary | ICD-10-CM

## 2019-06-15 DIAGNOSIS — Z20828 Contact with and (suspected) exposure to other viral communicable diseases: Secondary | ICD-10-CM | POA: Diagnosis present

## 2019-06-15 DIAGNOSIS — Z87442 Personal history of urinary calculi: Secondary | ICD-10-CM

## 2019-06-15 DIAGNOSIS — I4891 Unspecified atrial fibrillation: Secondary | ICD-10-CM | POA: Diagnosis present

## 2019-06-15 DIAGNOSIS — Z419 Encounter for procedure for purposes other than remedying health state, unspecified: Secondary | ICD-10-CM

## 2019-06-15 DIAGNOSIS — M961 Postlaminectomy syndrome, not elsewhere classified: Secondary | ICD-10-CM | POA: Diagnosis not present

## 2019-06-15 DIAGNOSIS — Z981 Arthrodesis status: Secondary | ICD-10-CM

## 2019-06-15 DIAGNOSIS — E039 Hypothyroidism, unspecified: Secondary | ICD-10-CM | POA: Diagnosis not present

## 2019-06-15 HISTORY — PX: TRANSFORAMINAL LUMBAR INTERBODY FUSION (TLIF) WITH PEDICLE SCREW FIXATION 1 LEVEL: SHX6141

## 2019-06-15 SURGERY — TRANSFORAMINAL LUMBAR INTERBODY FUSION (TLIF) WITH PEDICLE SCREW FIXATION 1 LEVEL
Anesthesia: General | Site: Back | Laterality: Left

## 2019-06-15 MED ORDER — PROPOFOL 10 MG/ML IV BOLUS
INTRAVENOUS | Status: DC | PRN
  Administered 2019-06-15 (×5): 50 mg via INTRAVENOUS

## 2019-06-15 MED ORDER — DEXAMETHASONE SODIUM PHOSPHATE 4 MG/ML IJ SOLN
4.0000 mg | Freq: Four times a day (QID) | INTRAMUSCULAR | Status: DC
  Administered 2019-06-15 (×2): 4 mg via INTRAVENOUS
  Filled 2019-06-15 (×2): qty 1

## 2019-06-15 MED ORDER — SENNA 8.6 MG PO TABS
1.0000 | ORAL_TABLET | Freq: Two times a day (BID) | ORAL | Status: DC
  Administered 2019-06-15 – 2019-06-16 (×2): 8.6 mg via ORAL
  Filled 2019-06-15 (×2): qty 1

## 2019-06-15 MED ORDER — FENTANYL CITRATE (PF) 250 MCG/5ML IJ SOLN
INTRAMUSCULAR | Status: AC
  Filled 2019-06-15: qty 5

## 2019-06-15 MED ORDER — LEVOTHYROXINE SODIUM 50 MCG PO TABS
50.0000 ug | ORAL_TABLET | Freq: Every day | ORAL | Status: DC
  Administered 2019-06-16: 50 ug via ORAL
  Filled 2019-06-15: qty 1
  Filled 2019-06-15: qty 2

## 2019-06-15 MED ORDER — THROMBIN 20000 UNITS EX SOLR
CUTANEOUS | Status: DC | PRN
  Administered 2019-06-15: 20 mL via TOPICAL

## 2019-06-15 MED ORDER — 0.9 % SODIUM CHLORIDE (POUR BTL) OPTIME
TOPICAL | Status: DC | PRN
  Administered 2019-06-15: 1000 mL

## 2019-06-15 MED ORDER — HYDROCODONE-ACETAMINOPHEN 10-325 MG PO TABS: 1.0000 | ORAL_TABLET | ORAL | Status: DC | PRN

## 2019-06-15 MED ORDER — TAMSULOSIN HCL 0.4 MG PO CAPS
0.4000 mg | ORAL_CAPSULE | Freq: Every day | ORAL | Status: DC
  Administered 2019-06-15: 0.4 mg via ORAL
  Filled 2019-06-15: qty 1

## 2019-06-15 MED ORDER — METHOCARBAMOL 1000 MG/10ML IJ SOLN
500.0000 mg | Freq: Four times a day (QID) | INTRAVENOUS | Status: DC | PRN
  Filled 2019-06-15: qty 5

## 2019-06-15 MED ORDER — SODIUM CHLORIDE 0.9% FLUSH: 3.0000 mL | INTRAVENOUS | Status: DC | PRN

## 2019-06-15 MED ORDER — PHENYLEPHRINE 40 MCG/ML (10ML) SYRINGE FOR IV PUSH (FOR BLOOD PRESSURE SUPPORT)
PREFILLED_SYRINGE | INTRAVENOUS | Status: DC | PRN
  Administered 2019-06-15 (×3): 80 ug via INTRAVENOUS

## 2019-06-15 MED ORDER — ONDANSETRON HCL 4 MG/2ML IJ SOLN: 4.0000 mg | Freq: Four times a day (QID) | INTRAMUSCULAR | Status: DC | PRN

## 2019-06-15 MED ORDER — THROMBIN 5000 UNITS EX SOLR
CUTANEOUS | Status: AC
  Filled 2019-06-15: qty 5000

## 2019-06-15 MED ORDER — SODIUM CHLORIDE 0.9% FLUSH
3.0000 mL | Freq: Two times a day (BID) | INTRAVENOUS | Status: DC
  Administered 2019-06-15: 3 mL via INTRAVENOUS

## 2019-06-15 MED ORDER — POTASSIUM CHLORIDE IN NACL 20-0.9 MEQ/L-% IV SOLN: INTRAVENOUS | Status: DC

## 2019-06-15 MED ORDER — SUCCINYLCHOLINE CHLORIDE 200 MG/10ML IV SOSY
PREFILLED_SYRINGE | INTRAVENOUS | Status: DC | PRN
  Administered 2019-06-15: 100 mg via INTRAVENOUS

## 2019-06-15 MED ORDER — ONDANSETRON HCL 4 MG/2ML IJ SOLN: 4.0000 mg | Freq: Once | INTRAMUSCULAR | Status: DC | PRN

## 2019-06-15 MED ORDER — BUPIVACAINE HCL (PF) 0.25 % IJ SOLN
INTRAMUSCULAR | Status: DC | PRN
  Administered 2019-06-15: 4 mL

## 2019-06-15 MED ORDER — OXYCODONE HCL 5 MG PO TABS
10.0000 mg | ORAL_TABLET | ORAL | Status: DC | PRN
  Administered 2019-06-15 – 2019-06-16 (×4): 10 mg via ORAL
  Filled 2019-06-15 (×4): qty 2

## 2019-06-15 MED ORDER — PROPOFOL 500 MG/50ML IV EMUL
INTRAVENOUS | Status: DC | PRN
  Administered 2019-06-15: 50 ug/kg/min via INTRAVENOUS

## 2019-06-15 MED ORDER — PHENOL 1.4 % MT LIQD: 1.0000 | OROMUCOSAL | Status: DC | PRN

## 2019-06-15 MED ORDER — ACETAMINOPHEN 650 MG RE SUPP: 650.0000 mg | RECTAL | Status: DC | PRN

## 2019-06-15 MED ORDER — PROPOFOL 10 MG/ML IV BOLUS
INTRAVENOUS | Status: AC
  Filled 2019-06-15: qty 20

## 2019-06-15 MED ORDER — ONDANSETRON HCL 4 MG/2ML IJ SOLN
INTRAMUSCULAR | Status: DC | PRN
  Administered 2019-06-15: 4 mg via INTRAVENOUS

## 2019-06-15 MED ORDER — DEXAMETHASONE 4 MG PO TABS
4.0000 mg | ORAL_TABLET | Freq: Four times a day (QID) | ORAL | Status: DC
  Administered 2019-06-16 (×2): 4 mg via ORAL
  Filled 2019-06-15 (×2): qty 1

## 2019-06-15 MED ORDER — METHOCARBAMOL 500 MG PO TABS
ORAL_TABLET | ORAL | Status: AC
  Filled 2019-06-15: qty 1

## 2019-06-15 MED ORDER — PHENYLEPHRINE 40 MCG/ML (10ML) SYRINGE FOR IV PUSH (FOR BLOOD PRESSURE SUPPORT)
PREFILLED_SYRINGE | INTRAVENOUS | Status: AC
  Filled 2019-06-15: qty 10

## 2019-06-15 MED ORDER — ONDANSETRON HCL 4 MG/2ML IJ SOLN
INTRAMUSCULAR | Status: AC
  Filled 2019-06-15: qty 2

## 2019-06-15 MED ORDER — FENTANYL CITRATE (PF) 100 MCG/2ML IJ SOLN
25.0000 ug | INTRAMUSCULAR | Status: DC | PRN
  Administered 2019-06-15 (×4): 25 ug via INTRAVENOUS

## 2019-06-15 MED ORDER — FENTANYL CITRATE (PF) 250 MCG/5ML IJ SOLN
INTRAMUSCULAR | Status: DC | PRN
  Administered 2019-06-15 (×2): 50 ug via INTRAVENOUS
  Administered 2019-06-15: 100 ug via INTRAVENOUS
  Administered 2019-06-15 (×4): 50 ug via INTRAVENOUS

## 2019-06-15 MED ORDER — LACTATED RINGERS IV SOLN
INTRAVENOUS | Status: DC
  Administered 2019-06-15: 15:00:00 via INTRAVENOUS

## 2019-06-15 MED ORDER — MORPHINE SULFATE (PF) 2 MG/ML IV SOLN
2.0000 mg | INTRAVENOUS | Status: DC | PRN
  Administered 2019-06-15: 2 mg via INTRAVENOUS
  Filled 2019-06-15: qty 1

## 2019-06-15 MED ORDER — FENTANYL CITRATE (PF) 100 MCG/2ML IJ SOLN
INTRAMUSCULAR | Status: AC
  Filled 2019-06-15: qty 2

## 2019-06-15 MED ORDER — EPHEDRINE 5 MG/ML INJ
INTRAVENOUS | Status: AC
  Filled 2019-06-15: qty 10

## 2019-06-15 MED ORDER — MIDAZOLAM HCL 2 MG/2ML IJ SOLN
INTRAMUSCULAR | Status: DC | PRN
  Administered 2019-06-15: 2 mg via INTRAVENOUS

## 2019-06-15 MED ORDER — METHOCARBAMOL 500 MG PO TABS
500.0000 mg | ORAL_TABLET | Freq: Four times a day (QID) | ORAL | Status: DC | PRN
  Administered 2019-06-15 – 2019-06-16 (×3): 500 mg via ORAL
  Filled 2019-06-15 (×2): qty 1

## 2019-06-15 MED ORDER — MENTHOL 3 MG MT LOZG: 1.0000 | LOZENGE | OROMUCOSAL | Status: DC | PRN

## 2019-06-15 MED ORDER — SODIUM CHLORIDE 0.9 % IV SOLN: 250.0000 mL | INTRAVENOUS | Status: DC

## 2019-06-15 MED ORDER — EPHEDRINE SULFATE-NACL 50-0.9 MG/10ML-% IV SOSY
PREFILLED_SYRINGE | INTRAVENOUS | Status: DC | PRN
  Administered 2019-06-15 (×2): 5 mg via INTRAVENOUS

## 2019-06-15 MED ORDER — CELECOXIB 200 MG PO CAPS
200.0000 mg | ORAL_CAPSULE | Freq: Two times a day (BID) | ORAL | Status: DC
  Administered 2019-06-15 – 2019-06-16 (×2): 200 mg via ORAL
  Filled 2019-06-15 (×2): qty 1

## 2019-06-15 MED ORDER — ROCURONIUM BROMIDE 100 MG/10ML IV SOLN: INTRAVENOUS | Status: DC | PRN

## 2019-06-15 MED ORDER — MIDAZOLAM HCL 2 MG/2ML IJ SOLN
INTRAMUSCULAR | Status: AC
  Filled 2019-06-15: qty 2

## 2019-06-15 MED ORDER — DEXAMETHASONE SODIUM PHOSPHATE 10 MG/ML IJ SOLN
INTRAMUSCULAR | Status: DC | PRN
  Administered 2019-06-15: 10 mg via INTRAVENOUS

## 2019-06-15 MED ORDER — CEFAZOLIN SODIUM-DEXTROSE 2-4 GM/100ML-% IV SOLN
2.0000 g | Freq: Three times a day (TID) | INTRAVENOUS | Status: AC
  Administered 2019-06-15 – 2019-06-16 (×2): 2 g via INTRAVENOUS
  Filled 2019-06-15 (×2): qty 100

## 2019-06-15 MED ORDER — LIDOCAINE 2% (20 MG/ML) 5 ML SYRINGE
INTRAMUSCULAR | Status: DC | PRN
  Administered 2019-06-15: 60 mg via INTRAVENOUS

## 2019-06-15 MED ORDER — THROMBIN 20000 UNITS EX SOLR
CUTANEOUS | Status: AC
  Filled 2019-06-15: qty 20000

## 2019-06-15 MED ORDER — ACETAMINOPHEN 500 MG PO TABS
1000.0000 mg | ORAL_TABLET | Freq: Once | ORAL | Status: AC
  Administered 2019-06-15: 1000 mg via ORAL
  Filled 2019-06-15: qty 2

## 2019-06-15 MED ORDER — CEFAZOLIN SODIUM-DEXTROSE 2-3 GM-%(50ML) IV SOLR
INTRAVENOUS | Status: DC | PRN
  Administered 2019-06-15: 2 g via INTRAVENOUS

## 2019-06-15 MED ORDER — ACETAMINOPHEN 325 MG PO TABS: 650.0000 mg | ORAL_TABLET | ORAL | Status: DC | PRN

## 2019-06-15 MED ORDER — SODIUM CHLORIDE 0.9 % IV SOLN
INTRAVENOUS | Status: DC | PRN
  Administered 2019-06-15: 500 mL

## 2019-06-15 MED ORDER — ONDANSETRON HCL 4 MG PO TABS: 4.0000 mg | ORAL_TABLET | Freq: Four times a day (QID) | ORAL | Status: DC | PRN

## 2019-06-15 MED ORDER — THROMBIN 5000 UNITS EX SOLR
OROMUCOSAL | Status: DC | PRN
  Administered 2019-06-15: 5 mL via TOPICAL

## 2019-06-15 MED ORDER — PHENYLEPHRINE HCL-NACL 10-0.9 MG/250ML-% IV SOLN
INTRAVENOUS | Status: DC | PRN
  Administered 2019-06-15: 50 ug/min via INTRAVENOUS

## 2019-06-15 MED ORDER — BUPIVACAINE HCL (PF) 0.25 % IJ SOLN
INTRAMUSCULAR | Status: AC
  Filled 2019-06-15: qty 30

## 2019-06-15 SURGICAL SUPPLY — 61 items
BAG DECANTER FOR FLEXI CONT (MISCELLANEOUS) ×2 IMPLANT
BASKET BONE COLLECTION (BASKET) ×2 IMPLANT
BENZOIN TINCTURE PRP APPL 2/3 (GAUZE/BANDAGES/DRESSINGS) ×2 IMPLANT
BLADE CLIPPER SURG (BLADE) IMPLANT
BONE CANC CHIPS 20CC PCAN1/4 (Bone Implant) ×2 IMPLANT
BUR MATCHSTICK NEURO 3.0 LAGG (BURR) ×2 IMPLANT
CANISTER SUCT 3000ML PPV (MISCELLANEOUS) ×2 IMPLANT
CARTRIDGE OIL MAESTRO DRILL (MISCELLANEOUS) ×1 IMPLANT
CHIPS CANC BONE 20CC PCAN1/4 (Bone Implant) ×1 IMPLANT
CLIP SPRING STIM LLIF SAFEOP (CLIP) ×2 IMPLANT
CONT SPEC 4OZ CLIKSEAL STRL BL (MISCELLANEOUS) ×2 IMPLANT
COVER BACK TABLE 60X90IN (DRAPES) ×2 IMPLANT
COVER WAND RF STERILE (DRAPES) IMPLANT
DIFFUSER DRILL AIR PNEUMATIC (MISCELLANEOUS) ×2 IMPLANT
DRAPE C-ARM 42X72 X-RAY (DRAPES) ×2 IMPLANT
DRAPE C-ARMOR (DRAPES) ×2 IMPLANT
DRAPE LAPAROTOMY 100X72X124 (DRAPES) ×2 IMPLANT
DRAPE POUCH INSTRU U-SHP 10X18 (DRAPES) ×2 IMPLANT
DRAPE SURG 17X23 STRL (DRAPES) ×2 IMPLANT
DRSG OPSITE POSTOP 4X6 (GAUZE/BANDAGES/DRESSINGS) ×2 IMPLANT
DURAPREP 26ML APPLICATOR (WOUND CARE) ×2 IMPLANT
ELECT KIT SAFEOP EMG/NMJ (ELECTRODE) ×1
ELECT REM PT RETURN 9FT ADLT (ELECTROSURGICAL) ×2
ELECTRODE REM PT RTRN 9FT ADLT (ELECTROSURGICAL) ×1 IMPLANT
EVACUATOR 1/8 PVC DRAIN (DRAIN) IMPLANT
GAUZE 4X4 16PLY RFD (DISPOSABLE) IMPLANT
GLOVE BIO SURGEON STRL SZ7 (GLOVE) IMPLANT
GLOVE BIO SURGEON STRL SZ8 (GLOVE) ×4 IMPLANT
GLOVE BIOGEL PI IND STRL 7.0 (GLOVE) IMPLANT
GLOVE BIOGEL PI INDICATOR 7.0 (GLOVE)
GOWN STRL REUS W/ TWL LRG LVL3 (GOWN DISPOSABLE) IMPLANT
GOWN STRL REUS W/ TWL XL LVL3 (GOWN DISPOSABLE) ×2 IMPLANT
GOWN STRL REUS W/TWL 2XL LVL3 (GOWN DISPOSABLE) IMPLANT
GOWN STRL REUS W/TWL LRG LVL3 (GOWN DISPOSABLE)
GOWN STRL REUS W/TWL XL LVL3 (GOWN DISPOSABLE) ×2
HEMOSTAT POWDER KIT SURGIFOAM (HEMOSTASIS) IMPLANT
KIT BASIN OR (CUSTOM PROCEDURE TRAY) ×2 IMPLANT
KIT EMG SRFC ELECT SAFEOP (ELECTRODE) ×1 IMPLANT
KIT TURNOVER KIT B (KITS) ×2 IMPLANT
NEEDLE HYPO 25X1 1.5 SAFETY (NEEDLE) ×2 IMPLANT
NS IRRIG 1000ML POUR BTL (IV SOLUTION) ×2 IMPLANT
OIL CARTRIDGE MAESTRO DRILL (MISCELLANEOUS) ×2
PACK LAMINECTOMY NEURO (CUSTOM PROCEDURE TRAY) ×2 IMPLANT
PAD ARMBOARD 7.5X6 YLW CONV (MISCELLANEOUS) ×6 IMPLANT
PUTTY DBM 10CC ×2 IMPLANT
ROD LORD LIP TI 5.5X30 (Rod) ×4 IMPLANT
SCREW CANC SHANK MOD 6.5X45 (Screw) ×8 IMPLANT
SCREW POLYAXIAL TULIP (Screw) ×8 IMPLANT
SET SCREW (Screw) ×4 IMPLANT
SET SCREW SPNE (Screw) ×4 IMPLANT
SPONGE LAP 4X18 RFD (DISPOSABLE) IMPLANT
SPONGE SURGIFOAM ABS GEL 100 (HEMOSTASIS) ×2 IMPLANT
STRIP CLOSURE SKIN 1/2X4 (GAUZE/BANDAGES/DRESSINGS) ×2 IMPLANT
SUT VIC AB 0 CT1 18XCR BRD8 (SUTURE) ×1 IMPLANT
SUT VIC AB 0 CT1 8-18 (SUTURE) ×1
SUT VIC AB 2-0 CP2 18 (SUTURE) ×2 IMPLANT
SUT VIC AB 3-0 SH 8-18 (SUTURE) ×4 IMPLANT
TOWEL GREEN STERILE (TOWEL DISPOSABLE) ×2 IMPLANT
TOWEL GREEN STERILE FF (TOWEL DISPOSABLE) ×2 IMPLANT
TRAY FOLEY MTR SLVR 16FR STAT (SET/KITS/TRAYS/PACK) ×2 IMPLANT
WATER STERILE IRR 1000ML POUR (IV SOLUTION) ×2 IMPLANT

## 2019-06-15 NOTE — Op Note (Signed)
06/15/2019  5:03 PM  PATIENT:  Keith [name].  78 y.o. 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 Decompressive lumbar laminectomy, hemifacetectomy and foraminotomy L4-5 on the left  2. Posterior fixation L4-5 using Alphatec cortical pedicle screws.  3. Intertransverse arthrodesis L4-5 bilaterally using morcellized autograft and allograft.  SURGEON:  Sherley Bounds, MD  ASSISTANTS: Glenford Peers, FNP  ANESTHESIA:  General  EBL: 50 ml  Total I/O In: 1200 [I.V.:1200] Out: 260 [Urine:210; Blood:50]  BLOOD ADMINISTERED:none  DRAINS: none   INDICATION FOR PROCEDURE: This patient presented with back and left leg pain. Imaging revealed postlaminectomy spondylolisthesis with stenosis L4-5 on the left. The patient tried a reasonable attempt at conservative medical measures without relief. I recommended decompression and instrumented fusion to address the stenosis as well as the segmental  instability.  Patient understood the risks, benefits, and alternatives and potential outcomes and wished to proceed.  PROCEDURE DETAILS:  The patient was brought to the operating room. After induction of generalized endotracheal anesthesia the patient was rolled into the prone position on chest rolls and all pressure points were padded. The patient's lumbar region was cleaned and then prepped with DuraPrep and draped in the usual sterile fashion. Anesthesia was injected and then a dorsal midline incision was made and carried down to the lumbosacral fascia. The fascia was opened and the paraspinous musculature was taken down in a subperiosteal fashion to expose L4-5. A self-retaining retractor was placed. Intraoperative fluoroscopy confirmed my level, and I started with placement of the L4 cortical pedicle screws. The pedicle screw entry zones were identified utilizing surface  landmarks and  AP and lateral fluoroscopy. I scored the cortex with the high-speed drill and then used the hand drill to drill an upward and outward direction into the pedicle. I then tapped line to line. I then placed a 6.5 x 45 mm cortical pedicle screw into the pedicles of L4 bilaterally.  The tap and the screws were monitored.   I then turned my attention to the decompression and complete lumbar laminectomy, hemi- facetectomy, and foraminotomy was performed at L4-5 on the left.  My nurse practitioner was directly involved in the decompression and exposure of the neural elements. the patient had significant spinal stenosis  Much more generous decompression and generous foraminotomy was undertaken in order to adequately decompress the neural elements and address the patient's leg pain. The yellow ligament was removed to expose the underlying dura and nerve roots, and generous foraminotomies were performed to adequately decompress the neural elements. Both the exiting and traversing nerve roots were decompressed  until a coronary dilator passed easily along the nerve roots. Once the decompression was complete, I turned my attention to the   placement of the lower pedicle screws. The pedicle screw entry zones were identified utilizing surface landmarks and fluoroscopy. I drilled into each pedicle utilizing the hand drill, and tapped each pedicle with the appropriate tap. We palpated with a ball probe to assure no break in the cortex. We then placed 6.5 x 45 mm cortical pedicle screws into the pedicles bilaterally at L5.  My nurse practitioner assisted in placement of the pedicle screws.  We then decorticated the transverse processes and laid a mixture of morcellized autograft and allograft out over these to perform intertransverse arthrodesis at 4 5 bilaterally. We then placed lordotic rods into the multiaxial screw heads of the pedicle screws  and locked these in position with the locking caps and anti-torque  device. We then checked our construct with AP and lateral fluoroscopy. Irrigated with copious amounts of bacitracin-containing saline solution. Inspected the nerve roots once again to assure adequate decompression, lined to the dura with Gelfoam,  and then we closed the muscle and the fascia with 0 Vicryl. Closed the subcutaneous tissues with 2-0 Vicryl and subcuticular tissues with 3-0 Vicryl. The skin was closed with benzoin and Steri-Strips. Dressing was then applied, the patient was awakened from general anesthesia and transported to the recovery room in stable condition. At the end of the procedure all sponge, needle and instrument counts were correct.   PLAN OF CARE: admit to inpatient  PATIENT DISPOSITION:  PACU - hemodynamically stable.   Delay start of Pharmacological VTE agent (>24hrs) due to surgical blood loss or risk of bleeding:  yes

## 2019-06-15 NOTE — H&P (Signed)
Subjective: Patient is a 78 y.o. male admitted for leg pain. Onset of symptoms was several months ago, worsening since that time.  The pain is rated severe, and is located at the across the lower back and radiates to leg. The pain is described as aching and occurs all day. The symptoms have been progressive. Symptoms are exacerbated by exercise. MRI or CT showed spondylolisthesis with stenosis l4-5   Past Medical History:  Diagnosis Date  . Arthritis   . Atrial fibrillation ([facility])    Cardioversion 2016  . Atrial flutter ([facility])    Cardioversion 2013  . 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 Dysrhythmia   . GERD (gastroesophageal reflux disease)   . History of aortic valve replacement with bioprosthetic valve    Aortic regurgitation  . History of chicken pox   . History of kidney stones   . History of skin cancer   . History of stroke 2011  . Seasonal allergies   . Stroke [facility])     Past Surgical History:  Procedure Laterality Date  . AORTIC VALVE REPLACEMENT  2009   Bioprosthetic AVR and root replacement  . BACK SURGERY    . EPIGASTRIC HERNIA REPAIR    . HERNIA REPAIR    . LUMBAR LAMINECTOMY/DECOMPRESSION MICRODISCECTOMY Right 06/01/2017   Procedure: EXTRAFORAMINAL MICRODISCECTOMY LUMBAR FIVE- SACRAL ONE, RIGHT;  Surgeon: Eustace Moore, MD;  Location: [facility];  Service: Neurosurgery;  Laterality: Right;  . LUMBAR LAMINECTOMY/DECOMPRESSION MICRODISCECTOMY Bilateral 09/04/2018   Procedure: Laminectomy and Foraminotomy - Lumbar two-three bilateral;  Surgeon: Eustace Moore, MD;  Location: [facility];  Service: Neurosurgery;  Laterality: Bilateral;    Prior to Admission medications   Medication Sig Start Date End Date Taking? Authorizing Provider  apixaban (ELIQUIS) 5 MG TABS tablet Take 5 mg by mouth 2 (two) times daily.   Yes [provider]  Cholecalciferol (DIALYVITE VITAMIN D 5000) 125 MCG (5000 UT) capsule Take 5,000 Units by mouth every evening.   Yes [provider]  diphenhydrAMINE (BENADRYL) 25 MG tablet Take 25 mg by mouth daily as needed for allergies.   Yes [provider]  Glucos-MSM-C-Mn-Ginger-Willow (GLUCOSAMINE MSM COMPLEX) TABS Take 1 tablet by mouth at bedtime.    Yes [provider]  ibuprofen (ADVIL,MOTRIN) 200 MG tablet Take 400 mg by mouth daily as needed for headache or moderate pain.    Yes [provider]  levothyroxine (SYNTHROID) 50 MCG tablet Take 1 tablet (50 mcg total) by mouth daily before breakfast. Please schedule a physical with your primary care physician 02/20/19  Yes Brunetta Jeans, PA-C  tamsulosin (FLOMAX) 0.4 MG CAPS capsule TAKE 1 CAPSULE BY MOUTH ONCE A DAY Patient taking differently: Take 0.4 mg by mouth at bedtime.  11/17/18  Yes Brunetta Jeans, PA-C  Vitamin A 2400 MCG (8000 UT) CAPS Take 2,400 mcg by mouth every evening.   Yes [provider]  zinc sulfate 220 (50 Zn) MG capsule Take 220 mg by mouth every evening.   Yes [provider]   Allergies  Allergen Reactions  . Tape     Social History   Tobacco Use  . Smoking status: Never Smoker  . Smokeless tobacco: Never Used  Substance Use Topics  . Alcohol use: Yes    Frequency: Never    Comment: occasional    History reviewed. No pertinent family history.   Review of Systems  Positive ROS: neg  All other systems have been reviewed and  were otherwise negative with the exception of those mentioned in the HPI and as above.  Objective: Vital signs in last 24 hours: Temp:  [98.4 F (36.9 C)] 98.4 F (36.9 C) (12/04 1212) Pulse Rate:  [89] 89 (12/04 1212) Resp:  [18] 18 (12/04 1212) BP: (183)/(95) 183/95 (12/04 1212) SpO2:  [100 %] 100 % (12/04 1212) Weight:  [89.8 kg] 89.8 kg (12/04 1212)  General Appearance: Alert, cooperative, no distress, appears stated age Head: Normocephalic, without obvious abnormality, atraumatic Eyes: PERRL, conjunctiva/corneas clear, EOM's intact    Neck: Supple,  symmetrical, trachea midline Back: Symmetric, no curvature, ROM normal, no CVA tenderness Lungs:  respirations unlabored Heart: Regular rate and rhythm Abdomen: Soft, non-tender Extremities: Extremities normal, atraumatic, no cyanosis or edema Pulses: 2+ and symmetric all extremities Skin: Skin color, texture, turgor normal, no rashes or lesions  NEUROLOGIC:   Mental status: Alert and oriented x4,  no aphasia, good attention span, fund of knowledge, and memory Motor Exam - grossly normal Sensory Exam - grossly normal Reflexes: 1+ Coordination - grossly normal Gait - grossly normal Balance - grossly normal Cranial Nerves: I: smell Not tested  II: visual acuity  OS: nl    OD: nl  II: visual fields Full to confrontation  II: pupils Equal, round, reactive to light  III,VII: ptosis None  III,IV,VI: extraocular muscles  Full ROM  V: mastication Normal  V: facial light touch sensation  Normal  V,VII: corneal reflex  Present  VII: facial muscle function - upper  Normal  VII: facial muscle function - lower Normal  VIII: hearing Not tested  IX: soft palate elevation  Normal  IX,X: gag reflex Present  XI: trapezius strength  5/5  XI: sternocleidomastoid strength 5/5  XI: neck flexion strength  5/5  XII: tongue strength  Normal    Data Review Lab Results  Component Value Date   WBC 11.3 (H) 06/12/2019   HGB 14.9 06/12/2019   HCT 45.6 06/12/2019   MCV 96.0 06/12/2019   PLT 196 06/12/2019   Lab Results  Component Value Date   NA 142 06/12/2019   K 4.0 06/12/2019   CL 104 06/12/2019   CO2 25 06/12/2019   BUN 24 (H) 06/12/2019   CREATININE 1.20 06/12/2019   GLUCOSE 110 (H) 06/12/2019   Lab Results  Component Value Date   INR 1.1 06/12/2019    Assessment/Plan:  Estimated body mass index is 27.62 kg/m as calculated from the following:   Height as of this encounter: 5\' 11"  (1.803 m).   Weight as of this encounter: 89.8 kg. Patient admitted for TLIF L4-5. Patient has  failed a reasonable attempt at conservative therapy.  I explained the condition and procedure to the patient and answered any questions.  Patient wishes to proceed with procedure as planned. Understands risks/ benefits and typical outcomes of procedure.   Keith [name] 06/15/2019 1:50 PM

## 2019-06-15 NOTE — Anesthesia Procedure Notes (Signed)
Procedure Name: Intubation Performed by: Valda Favia, CRNA Pre-anesthesia Checklist: Patient identified, Emergency Drugs available, Suction available and Patient being monitored Patient Re-evaluated:Patient Re-evaluated prior to induction Oxygen Delivery Method: Circle System Utilized Preoxygenation: Pre-oxygenation with 100% oxygen Induction Type: IV induction Ventilation: Mask ventilation without difficulty Laryngoscope Size: Mac and 4 Grade View: Grade I Tube type: Oral Tube size: 7.5 mm Number of attempts: 1 Airway Equipment and Method: Stylet and Oral airway Placement Confirmation: ETT inserted through vocal cords under direct vision,  positive ETCO2 and breath sounds checked- equal and bilateral Secured at: 22 cm Tube secured with: Tape Dental Injury: Teeth and Oropharynx as per pre-operative assessment

## 2019-06-15 NOTE — Transfer of Care (Signed)
Immediate Anesthesia Transfer of Care Note  Patient: Keith [name].  Procedure(s) Performed: Decompression and Instrumention Fusion Lumbar Four- Five (Left Back)  Patient Location: PACU  Anesthesia Type:General  Level of Consciousness: drowsy and patient cooperative  Airway & Oxygen Therapy: Patient Spontanous Breathing  Post-op Assessment: Report given to RN and Post -op Vital signs reviewed and stable  Post vital signs: Reviewed and stable  Last Vitals:  Vitals Value Taken Time  BP 141/88 06/15/19 1711  Temp    Pulse 81 06/15/19 1714  Resp 10 06/15/19 1714  SpO2 80 % 06/15/19 1714  Vitals shown include unvalidated device data.  Last Pain:  Vitals:   06/15/19 1212  TempSrc: Oral  PainSc:       Patients Stated Pain Goal: 3 ([id number])  Complications: No apparent anesthesia complications

## 2019-06-15 NOTE — Progress Notes (Signed)
Assessment completed by Debbe Bales Day, RN

## 2019-06-16 MED ORDER — METHOCARBAMOL 500 MG PO TABS: 500.0000 mg | ORAL_TABLET | Freq: Four times a day (QID) | ORAL | 1 refills | Status: DC | PRN

## 2019-06-16 MED ORDER — OXYCODONE HCL 10 MG PO TABS: 10.0000 mg | ORAL_TABLET | ORAL | 0 refills | Status: DC | PRN

## 2019-06-16 MED FILL — METHOCARBAMOL 500 MG TABS: 500 | 7 days supply | Qty: 28 | Fill #0

## 2019-06-16 MED FILL — oxyCODONE HCL 10 MG TABS: 10 | 5 days supply | Qty: 30 | Fill #0

## 2019-06-16 NOTE — Plan of Care (Signed)
Patient alert and oriented, mae's well, voiding adequate amount of urine, swallowing without difficulty, no c/o pain at time of discharge. Patient discharged home with family. Script and discharged instructions given to patient. Patient and family stated understanding of instructions given. Patient has an appointment with Dr. Stach °

## 2019-06-16 NOTE — Anesthesia Postprocedure Evaluation (Signed)
Anesthesia Post Note  Patient: Keith [name].  Procedure(s) Performed: Decompression and Instrumention Fusion Lumbar Four- Five (Left Back)     Patient location during evaluation: PACU Anesthesia Type: General Level of consciousness: awake and alert Pain management: pain level controlled Vital Signs Assessment: post-procedure vital signs reviewed and stable Respiratory status: spontaneous breathing, nonlabored ventilation, respiratory function stable and patient connected to nasal cannula oxygen Cardiovascular status: blood pressure returned to baseline and stable Postop Assessment: no apparent nausea or vomiting Anesthetic complications: no    Last Vitals:  Vitals:   06/16/19 0318 06/16/19 0754  BP: (!) 148/85 124/80  Pulse: 82 78  Resp: 18 16  Temp: 36.7 C 36.5 C  SpO2: 100% 96%    Last Pain:  Vitals:   06/16/19 0754  TempSrc: Oral  PainSc:                  Effie Berkshire

## 2019-06-16 NOTE — Discharge Summary (Signed)
Physician Discharge Summary  Patient ID: Keith [name]. MRN: [id number] DOB/AGE: Dec 03, 1940 78 y.o.  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 He is doing well post-operatively. He has ambulated with therapies. His pain is well-controlled. He is ready for discharge home.  Consults: rehabilitation medicine  Significant Diagnostic Studies: radiology: Dg Lumbar Spine 2-3 Views  Result Date: 06/15/2019 CLINICAL DATA:  Post lumbar spine fusion. Intraoperative imaging. EXAM: LUMBAR SPINE - 2-3 VIEW; DG C-ARM 1-60 MIN COMPARISON:  [facility] 2020 FINDINGS: 53 seconds of fluoro time with 2 images provided, intraoperative fluoroscopic assessment. Signs of L4-5 laminectomy and spinal fusion with rod and transpedicular screws bilaterally. IMPRESSION: Intraoperative fluoroscopic assessment during L4-5 fusion as described. Electronically Signed   By: Zetta Bills M.D.   On: 06/15/2019 19:46   Dg C-arm 1-60 Min  Result Date: 06/15/2019 CLINICAL DATA:  Post lumbar spine fusion. Intraoperative imaging. EXAM: LUMBAR SPINE - 2-3 VIEW; DG C-ARM 1-60 MIN COMPARISON:  [facility]four 2020 FINDINGS: 53 seconds of fluoro time with 2 images provided, intraoperative fluoroscopic assessment. Signs of L4-5 laminectomy and spinal fusion with rod and transpedicular screws bilaterally. IMPRESSION: Intraoperative fluoroscopic assessment during L4-5 fusion as described. Electronically Signed   By: Zetta Bills M.D.   On: 06/15/2019 19:46     Treatments: surgery: 1. Decompressive lumbar laminectomy, hemifacetectomy and foraminotomy L4-5 on the left  2. Posterior fixation L4-5 using Alphatec cortical  pedicle screws.  3. Intertransverse arthrodesis L4-5 bilaterally using morcellized autograft and allograft.  Discharge Exam: Blood pressure 124/80, pulse 78, temperature 97.7 F (36.5 C), temperature source Oral, resp. rate 16, height 5\' 11"  (1.803 m), weight 89.8 kg, SpO2 96 %.   Alert and oriented x 4 PERRLA CN II-XII grossly intact MAE, Strength and sensation intact Incision is covered with Honeycomb dressing and Steri Strips; Dressing is clean, dry, and intact   Disposition: Discharge disposition: 01-Home or Self Care        Allergies as of 06/16/2019      Reactions   Tape       Medication List    STOP taking these medications   Eliquis 5 MG Tabs tablet Generic drug: apixaban   ibuprofen 200 MG tablet Commonly known as: ADVIL     TAKE these medications   Dialyvite Vitamin D 5000 125 MCG (5000 UT) capsule Generic drug: Cholecalciferol Take 5,000 Units by mouth every evening.   diphenhydrAMINE 25 MG tablet Commonly known as: BENADRYL Take 25 mg by mouth daily as needed for allergies.   Glucosamine MSM Complex Tabs Take 1 tablet by mouth at bedtime.   levothyroxine 50 MCG tablet Commonly known as: SYNTHROID Take 1 tablet (50 mcg total) by mouth daily before breakfast. Please schedule a physical with your primary care physician   methocarbamol 500 MG tablet Commonly known as: ROBAXIN Take 1 tablet (500 mg total) by mouth every 6 (six) hours as needed for muscle spasms.   Oxycodone HCl 10 MG Tabs Take 1 tablet (10 mg total) by mouth every 4 (four) hours as needed for severe pain.   tamsulosin 0.4 MG Caps capsule Commonly known as: FLOMAX TAKE 1 CAPSULE BY MOUTH ONCE A DAY What changed: when to take this   Vitamin A 2400 MCG (  8000 UT) Caps Take 2,400 mcg by mouth every evening.   zinc sulfate 220 (50 Zn) MG capsule Take 220 mg by mouth every evening.            Durable Medical Equipment  (From admission, onward)         Start     Ordered    06/15/19 1833  DME Walker rolling  Once    Question:  Patient needs a walker to treat with the following condition  Answer:  S/P lumbar fusion   06/15/19 1832   06/15/19 1833  DME 3 n 1  Once     06/15/19 1832         Follow-up Information    Eustace Moore, MD. Go in 2 week(s).   Specialty: Neurosurgery Contact information: [address] [place] [address] [place] [address] [phone]           Signed: Patricia Nettle 06/16/2019, 9:54 AM

## 2019-06-16 NOTE — Evaluation (Signed)
Physical Therapy Evaluation Patient Details Name: Keith [name]. MRN: [id number] DOB: [birth year] Today's Date: 06/16/2019   History of Present Illness  Pt is a 78 year old man admitted for L4-5 TLIF. PMH: 3 previous back surgeries, afib with cardioversion, stroke, AVR.  Clinical Impression  Pt presented supine in bed with HOB elevated, awake and willing to participate in therapy session. Prior to admission, pt reported that he was independent with all functional mobility and ADLs. Pt lives with his wife in a two level home with a couple of steps to enter. 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 At the time of evaluation, pt overall at a supervision level for functional mobility including hallway ambulation without an AD. Pt participated in stair training this session as well without difficulties. PT provided pt with back precautions handout and reviewed with pt throughout. PT provided pt education re: back precautions, car transfers and a generalized walking program for pt to initiate upon d/c home. No further acute PT needs identified at this time. PT signing off.     Follow Up Recommendations No PT follow up    Equipment Recommendations  None recommended by PT    Recommendations for Other Services       Precautions / Restrictions Precautions Precautions: Back Precaution Booklet Issued: Yes (comment) Precaution Comments: reviewed 3/3 back precautions with pt throughout Required Braces or Orthoses: Spinal Brace Spinal Brace: Lumbar corset;Applied in sitting position Restrictions Weight Bearing Restrictions: No      Mobility  Bed Mobility Overal bed mobility: Needs Assistance Bed Mobility: Rolling;Sidelying to Sit Rolling: Supervision Sidelying to sit: Supervision       General bed mobility comments: cueing for log roll technique, use of bed rail  Transfers Overall transfer level: Modified independent Equipment used: None              General transfer comment: somewhat slow to rise, but steady  Ambulation/Gait Ambulation/Gait assistance: Supervision Gait Distance (Feet): 500 Feet Assistive device: None Gait Pattern/deviations: Step-through pattern;Decreased stride length Gait velocity: decreased   General Gait Details: pt with mild instability but no overt LOB or need for physical assistance, supervision for safety  Stairs Stairs: Yes Stairs assistance: Supervision Stair Management: One rail Right;Alternating pattern;Step to pattern;Forwards Number of Stairs: 10 General stair comments: no instability or LOB, no need for physical assistance  Wheelchair Mobility    Modified Rankin (Stroke Patients Only)       Balance Overall balance assessment: Needs assistance Sitting-balance support: Feet supported Sitting balance-Leahy Scale: Good     Standing balance support: No upper extremity supported Standing balance-Leahy Scale: Fair                               Pertinent Vitals/Pain Pain Assessment: Faces Faces Pain Scale: Hurts little more Pain Location: back Pain Descriptors / Indicators: Sore Pain Intervention(s): Monitored during session;Repositioned    Home Living Family/patient expects to be discharged to:: Private residence Living Arrangements: Spouse/significant other Available Help at Discharge: Family;Available PRN/intermittently Type of Home: House Home Access: Stairs to enter   Entrance Stairs-Number of Steps: 2 Home Layout: Two level;Able to live on main level with bedroom/bathroom Home Equipment: Adaptive equipment;Shower seat - built in      Prior Function Level of Independence: Independent         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  Hand: Right    Extremity/Trunk Assessment   Upper Extremity Assessment Upper Extremity Assessment: Defer to OT evaluation;Overall WFL for tasks assessed    Lower Extremity Assessment Lower  Extremity Assessment: Overall WFL for tasks assessed    Cervical / Trunk Assessment Cervical / Trunk Assessment: Other exceptions Cervical / Trunk Exceptions: s/p lumbar sx  Communication   Communication: No difficulties  Cognition Arousal/Alertness: Awake/alert Behavior During Therapy: WFL for tasks assessed/performed Overall Cognitive Status: Within Functional Limits for tasks assessed                                        General Comments      Exercises     Assessment/Plan    PT Assessment Patent does not need any further PT services  PT Problem List         PT Treatment Interventions      PT Goals (Current goals can be found in the Care Plan section)  Acute Rehab PT Goals Patient Stated Goal: return to work PT Goal Formulation: All assessment and education complete, DC therapy    Frequency     Barriers to discharge        Co-evaluation               AM-PAC PT "6 Clicks" Mobility  Outcome Measure Help needed turning from your back to your side while in a flat bed without using bedrails?: None Help needed moving from lying on your back to sitting on the side of a flat bed without using bedrails?: None Help needed moving to and from a bed to a chair (including a wheelchair)?: None Help needed standing up from a chair using your arms (e.g., wheelchair or bedside chair)?: None Help needed to walk in hospital room?: None Help needed climbing 3-5 steps with a railing? : None 6 Click Score: 24    End of Session Equipment Utilized During Treatment: Gait belt;Back brace Activity Tolerance: Patient tolerated treatment well Patient left: in bed;with call bell/phone within reach;Other (comment)(seated EOB with OT present) Nurse Communication: Mobility status PT Visit Diagnosis: Other abnormalities of gait and mobility (R26.89)    Time: [id number] PT Time Calculation (min) (ACUTE ONLY): 14 min   Charges:   PT Evaluation $PT Eval Low  Complexity: 1 Low          Eduard Clos, PT, DPT  Acute Rehabilitation Services Pager [phone] Office [place] 06/16/2019, 10:31 AM

## 2019-06-16 NOTE — Discharge Instructions (Signed)

## 2019-06-16 NOTE — Evaluation (Signed)
Occupational Therapy Evaluation and Discharge Patient Details Name: Keith [name]. MRN: [id number] DOB: July 04, 1941 Today's Date: 06/16/2019    History of Present Illness Pt is a 78 year old man admitted for L4-5 TLIF. PMH: 3 previous back surgeries, afib with cardioversion, stroke, AVR.   Clinical Impression   All education completed with pt verbalizing and/or demonstrating understanding. No further OT needs.    Follow Up Recommendations  No OT follow up    Equipment Recommendations  None recommended by OT    Recommendations for Other Services       Precautions / Restrictions Precautions Precautions: Back Precaution Booklet Issued: Yes (comment) Restrictions Weight Bearing Restrictions: No      Mobility Bed Mobility               General bed mobility comments: pt seated at EOB  Transfers Overall transfer level: Modified independent Equipment used: None             General transfer comment: somewhat slow to rise, but steady    Balance                                           ADL either performed or assessed with clinical judgement   ADL Overall ADL's : Modified independent                                       General ADL Comments: Educated pt in compensatory strategies for ADL and IADL to avoid.     Vision Patient Visual Report: No change from baseline       Perception     Praxis      Pertinent Vitals/Pain Pain Assessment: Faces Faces Pain Scale: Hurts little more Pain Location: back Pain Descriptors / Indicators: Sore Pain Intervention(s): Monitored during session     Hand Dominance Right   Extremity/Trunk Assessment Upper Extremity Assessment Upper Extremity Assessment: Overall WFL for tasks assessed   Lower Extremity Assessment Lower Extremity Assessment: Defer to PT evaluation   Cervical / Trunk Assessment Cervical / Trunk Assessment: Other exceptions Cervical / Trunk Exceptions: s/p  back sx   Communication Communication Communication: No difficulties   Cognition Arousal/Alertness: Awake/alert Behavior During Therapy: WFL for tasks assessed/performed Overall Cognitive Status: Within Functional Limits for tasks assessed                                     General Comments       Exercises     Shoulder Instructions      Home Living Family/patient expects to be discharged to:: Private residence Living Arrangements: Spouse/significant other Available Help at Discharge: Family;Available PRN/intermittently Type of Home: House Home Access: Stairs to enter Entrance Stairs-Number of Steps: 2   Home Layout: Two level;Able to live on main level with bedroom/bathroom     Bathroom Shower/Tub: Occupational psychologist: Handicapped height     Home Equipment: Research scientist (life sciences);Shower seat - built in [employer]: [place]        Prior Functioning/Environment Level of Independence: Independent        Comments: works as a Designer, television/film set:  OT Treatment/Interventions:      OT Goals(Current goals can be found in the care plan section) Acute Rehab OT Goals Patient Stated Goal: return to work  OT Frequency:     Barriers to D/C:            Co-evaluation              AM-PAC OT "6 Clicks" Daily Activity     Outcome Measure Help from another person eating meals?: None Help from another person taking care of personal grooming?: None Help from another person toileting, which includes using toliet, bedpan, or urinal?: None Help from another person bathing (including washing, rinsing, drying)?: None Help from another person to put on and taking off regular upper body clothing?: None Help from another person to put on and taking off regular lower body clothing?: None 6 Click Score: 24   End of Session Equipment Utilized During Treatment: Back brace  Activity Tolerance: Patient tolerated  treatment well Patient left: in bed;with call bell/phone within reach  OT Visit Diagnosis: Pain                Time: [id number] OT Time Calculation (min): 21 min Charges:  OT General Charges $OT Visit: 1 Visit OT Evaluation $OT Eval Low Complexity: 1 Low  Nestor Lewandowsky, OTR/L Acute Rehabilitation Services Pager: [phone] Office: [phone]  Malka So 06/16/2019, 10:16 AM

## 2019-06-18 ENCOUNTER — Encounter: Payer: Self-pay | Admitting: *Deleted

## 2019-06-18 ENCOUNTER — Other Ambulatory Visit: Payer: Self-pay | Admitting: *Deleted

## 2019-06-18 ENCOUNTER — Encounter: Payer: Self-pay | Admitting: Physician Assistant

## 2019-06-18 NOTE — Patient Outreach (Signed)
[facility]) Care Management  06/18/2019  Keith [name]. 18-May-1941 [id number]   Transition of care call/case closure   Referral received:06/11/19 Initial outreach:12/2, preop screening call, post discharge call 06/18/19 Insurance: [place]    Subjective: Initial successful telephone call to patient's preferred number in order to complete transition of care assessment; 2 HIPAA identifiers verified. Explained purpose of call and completed transition of care assessment.  Keith [name] states that he is okay just a little sore, commenting that this was his 4th back surgery,  denies post-operative problems, says surgical dressing in place with plans to remove on today. He reports surgical pain is managed with prescribed medications. He reports having a good appetite and intake ,, denies bowel or bladder problems reports having bowel movements. He reports tolerating mobility in home, getting up with walk at least every hour, wearing his brace when out of bed. He verifies understanding to resume Eliquis per discharge instructions in 3 days on 12/8.  His  Spouse is  assisting with his recovery. He verifies Keith Noble, PA as his PCP locally.  He discussed history of atrial fib and hypertension says he does not need a referral to one of the [place] chronic disease management programs.  He states that his wife a pharmacist with [employer] has followed up on current  benefits . He denies additional  He  uses a [facility] outpatient pharmacy.  He denies educational needs related to staying safe during the COVID 19 pandemic.    Objective:  Keith [name]  was hospitalized at [facility] from 12/4-12/5/20  for Transforaminal Lumbar Interbody fusion, L4-L5.  Comorbidities include: Hypertension , Atrial fib, AVR root replacement, Arthritis, stroke,BPH He/She was discharged to home on 06/16/19  without the need for home health services or DME.   Assessment:  Patient voices good  understanding of all discharge instructions.  See transition of care flowsheet for assessment details.   Plan:  Reviewed hospital discharge diagnosis of  Transforaminal lumbar interbody fusion L4-L5   and discharge treatment plan using hospital discharge instructions, assessing medication adherence, reviewing problems requiring provider notification, and discussing the importance of follow up with surgeon, primary care provider and as directed. Reviewed [place]'s announcements that all [place] members will receive the Healthy Lifestyle Premium rate in 2021.   No ongoing care management needs identified so will close case to [facility] services and route successful outreach letter with [place] Management pamphlet and 24 Hour Nurse Line Magnet to [place] Management clinical pool to be mailed to patient's home address.   Joylene Draft, RN, [place] Management Coordinator  [phone]- Mobile [phone]- Toll Free Main Office

## 2019-06-19 ENCOUNTER — Encounter (HOSPITAL_COMMUNITY): Payer: Self-pay | Admitting: Neurological Surgery

## 2019-06-19 MED ORDER — TAMSULOSIN HCL 0.4 MG PO CAPS: 0.4000 mg | ORAL_CAPSULE | Freq: Every day | ORAL | 0 refills | Status: DC

## 2019-06-19 MED ORDER — LEVOTHYROXINE SODIUM 50 MCG PO TABS: 50.0000 ug | ORAL_TABLET | Freq: Every day | ORAL | 0 refills | Status: DC

## 2019-06-19 MED FILL — TAMSULOSIN HCL 0.4 MG CAP: 0.4 | 30 days supply | Qty: 30 | Fill #0

## 2019-06-19 MED FILL — LEVOTHYROXINE 50 MCG TABLET: 50 | 30 days supply | Qty: 30 | Fill #0

## 2019-06-23 MED FILL — FLUOROURACIL 5 % CREA: 5 | 10 days supply | Qty: 40 | Fill #2

## 2019-06-25 MED FILL — ELIQUIS 5 MG TABLET: 5 | 90 days supply | Qty: 180 | Fill #0

## 2019-06-28 MED FILL — OXYCODONE-ACETAMINOPHEN 5-3: 5-325 | 7 days supply | Qty: 40 | Fill #0

## 2019-06-28 MED FILL — METHOCARBAMOL 500 MG TABS: 500 | 7 days supply | Qty: 28 | Fill #1

## 2019-07-18 MED FILL — METOPROLOL TARTRATE 25 MG T: 25 | 90 days supply | Qty: 180 | Fill #0

## 2019-07-18 MED FILL — OXYCODONE-APAP 5-325MG: 5-325 | 6 days supply | Qty: 40 | Fill #0

## 2019-07-26 DIAGNOSIS — M4316 Spondylolisthesis, lumbar region: Secondary | ICD-10-CM | POA: Diagnosis not present

## 2019-07-26 MED FILL — MOVANTIK 12.5 MG TABLET: 12.5 | 15 days supply | Qty: 15 | Fill #0

## 2019-08-06 DIAGNOSIS — M7918 Myalgia, other site: Secondary | ICD-10-CM | POA: Diagnosis not present

## 2019-08-07 ENCOUNTER — Ambulatory Visit: Payer: Medicare Other | Admitting: Cardiovascular Disease

## 2019-08-14 ENCOUNTER — Encounter: Payer: Self-pay | Admitting: Physician Assistant

## 2019-08-14 ENCOUNTER — Other Ambulatory Visit: Payer: Self-pay

## 2019-08-14 ENCOUNTER — Ambulatory Visit (INDEPENDENT_AMBULATORY_CARE_PROVIDER_SITE_OTHER): Payer: 59 | Admitting: Physician Assistant

## 2019-08-14 VITALS — BP 140/80 | HR 83 | Temp 98.2°F | Resp 16 | Ht 71.0 in | Wt 203.0 lb

## 2019-08-14 DIAGNOSIS — N401 Enlarged prostate with lower urinary tract symptoms: Secondary | ICD-10-CM

## 2019-08-14 DIAGNOSIS — E039 Hypothyroidism, unspecified: Secondary | ICD-10-CM

## 2019-08-14 DIAGNOSIS — Z0000 Encounter for general adult medical examination without abnormal findings: Secondary | ICD-10-CM | POA: Diagnosis not present

## 2019-08-14 DIAGNOSIS — Z23 Encounter for immunization: Secondary | ICD-10-CM | POA: Diagnosis not present

## 2019-08-14 DIAGNOSIS — R351 Nocturia: Secondary | ICD-10-CM | POA: Diagnosis not present

## 2019-08-14 DIAGNOSIS — I10 Essential (primary) hypertension: Secondary | ICD-10-CM | POA: Diagnosis not present

## 2019-08-14 DIAGNOSIS — I480 Paroxysmal atrial fibrillation: Secondary | ICD-10-CM | POA: Diagnosis not present

## 2019-08-14 LAB — CBC WITH DIFFERENTIAL/PLATELET
Basophils Absolute: 0 10*3/uL (ref 0.0–0.1)
Basophils Relative: 0.3 % (ref 0.0–3.0)
Eosinophils Absolute: 0.1 10*3/uL (ref 0.0–0.7)
Eosinophils Relative: 0.8 % (ref 0.0–5.0)
HCT: 44.7 % (ref 39.0–52.0)
Hemoglobin: 14.9 g/dL (ref 13.0–17.0)
Lymphocytes Relative: 19.6 % (ref 12.0–46.0)
Lymphs Abs: 2.9 10*3/uL (ref 0.7–4.0)
MCHC: 33.3 g/dL (ref 30.0–36.0)
MCV: 90.7 fl (ref 78.0–100.0)
Monocytes Absolute: 1.3 10*3/uL — ABNORMAL HIGH (ref 0.1–1.0)
Monocytes Relative: 8.5 % (ref 3.0–12.0)
Neutro Abs: 10.5 10*3/uL — ABNORMAL HIGH (ref 1.4–7.7)
Neutrophils Relative %: 70.8 % (ref 43.0–77.0)
Platelets: 247 10*3/uL (ref 150.0–400.0)
RBC: 4.93 Mil/uL (ref 4.22–5.81)
RDW: 14.6 % (ref 11.5–15.5)
WBC: 14.8 10*3/uL — ABNORMAL HIGH (ref 4.0–10.5)

## 2019-08-14 LAB — LDL CHOLESTEROL, DIRECT: Direct LDL: 119 mg/dL

## 2019-08-14 LAB — COMPREHENSIVE METABOLIC PANEL
ALT: 25 U/L (ref 0–53)
AST: 23 U/L (ref 0–37)
Albumin: 4.2 g/dL (ref 3.5–5.2)
Alkaline Phosphatase: 88 U/L (ref 39–117)
BUN: 35 mg/dL — ABNORMAL HIGH (ref 6–23)
CO2: 26 mEq/L (ref 19–32)
Calcium: 9 mg/dL (ref 8.4–10.5)
Chloride: 103 mEq/L (ref 96–112)
Creatinine, Ser: 1.05 mg/dL (ref 0.40–1.50)
GFR: 67.98 mL/min (ref >60.00)
Glucose, Bld: 77 mg/dL (ref 70–99)
Potassium: 3.7 mEq/L (ref 3.5–5.1)
Sodium: 141 mEq/L (ref 135–145)
Total Bilirubin: 0.8 mg/dL (ref 0.2–1.2)
Total Protein: 6.8 g/dL (ref 6.0–8.3)

## 2019-08-14 LAB — LIPID PANEL
Cholesterol: 204 mg/dL — ABNORMAL HIGH (ref 0–200)
HDL: 50.3 mg/dL (ref >39.00)
NonHDL: 153.82
Total CHOL/HDL Ratio: 4
Triglycerides: 230 mg/dL — ABNORMAL HIGH (ref 0.0–149.0)
VLDL: 46 mg/dL — ABNORMAL HIGH (ref 0.0–40.0)

## 2019-08-14 LAB — PSA: PSA: 3.16 ng/mL (ref 0.10–4.00)

## 2019-08-14 MED ORDER — LEVOTHYROXINE SODIUM 50 MCG PO TABS: 50.0000 ug | ORAL_TABLET | Freq: Every day | ORAL | 1 refills | Status: DC

## 2019-08-14 MED ORDER — TAMSULOSIN HCL 0.4 MG PO CAPS: 0.4000 mg | ORAL_CAPSULE | Freq: Every day | ORAL | 1 refills | Status: DC

## 2019-08-14 MED FILL — LEVOTHYROXINE 50 MCG TABLET: 50 | 30 days supply | Qty: 30 | Fill #0

## 2019-08-14 MED FILL — TAMSULOSIN HCL 0.4 MG CAP: 0.4 | 90 days supply | Qty: 90 | Fill #0

## 2019-08-14 NOTE — Patient Instructions (Addendum)
Please go to the lab today for blood work.  I will call you with your results. We will alter treatment regimen(s) if indicated by your results.   Please continue current medication regimen. Once you have been back on your thyroid medication for 4-6 weeks we will need you to come in and recheck TSH levels.   Please follow-up with specialists as scheduled.    Preventive Care 80 Years and Older, Male Preventive care refers to lifestyle choices and visits with your health care provider that can promote health and wellness. This includes:  A yearly physical exam. This is also called an annual well check.  Regular dental and eye exams.  Immunizations.  Screening for certain conditions.  Healthy lifestyle choices, such as diet and exercise. What can I expect for my preventive care visit? Physical exam Your health care provider will check:  Height and weight. These may be used to calculate body mass index (BMI), which is a measurement that tells if you are at a healthy weight.  Heart rate and blood pressure.  Your skin for abnormal spots. Counseling Your health care provider may ask you questions about:  Alcohol, tobacco, and drug use.  Emotional well-being.  Home and relationship well-being.  Sexual activity.  Eating habits.  History of falls.  Memory and ability to understand (cognition).  Work and work Statistician. What immunizations do I need?  Influenza (flu) vaccine  This is recommended every year. Tetanus, diphtheria, and pertussis (Tdap) vaccine  You may need a Td booster every 10 years. Varicella (chickenpox) vaccine  You may need this vaccine if you have not already been vaccinated. Zoster (shingles) vaccine  You may need this after age 59. Pneumococcal conjugate (PCV13) vaccine  One dose is recommended after age 50. Pneumococcal polysaccharide (PPSV23) vaccine  One dose is recommended after age 46. Measles, mumps, and rubella (MMR) vaccine  You  may need at least one dose of MMR if you were born in 1957 or later. You may also need a second dose. Meningococcal conjugate (MenACWY) vaccine  You may need this if you have certain conditions. Hepatitis A vaccine  You may need this if you have certain conditions or if you travel or work in places where you may be exposed to hepatitis A. Hepatitis B vaccine  You may need this if you have certain conditions or if you travel or work in places where you may be exposed to hepatitis B. Haemophilus influenzae type b (Hib) vaccine  You may need this if you have certain conditions. You may receive vaccines as individual doses or as more than one vaccine together in one shot (combination vaccines). Talk with your health care provider about the risks and benefits of combination vaccines. What tests do I need? Blood tests  Lipid and cholesterol levels. These may be checked every 5 years, or more frequently depending on your overall health.  Hepatitis C test.  Hepatitis B test. Screening  Lung cancer screening. You may have this screening every year starting at age 9 if you have a 30-pack-year history of smoking and currently smoke or have quit within the past 15 years.  Colorectal cancer screening. All adults should have this screening starting at age 56 and continuing until age 38. Your health care provider may recommend screening at age 31 if you are at increased risk. You will have tests every 1-10 years, depending on your results and the type of screening test.  Prostate cancer screening. Recommendations will vary depending on your  family history and other risks.  Diabetes screening. This is done by checking your blood sugar (glucose) after you have not eaten for a while (fasting). You may have this done every 1-3 years.  Abdominal aortic aneurysm (AAA) screening. You may need this if you are a current or former smoker.  Sexually transmitted disease (STD) testing. Follow these  instructions at home: Eating and drinking  Eat a diet that includes fresh fruits and vegetables, whole grains, lean protein, and low-fat dairy products. Limit your intake of foods with high amounts of sugar, saturated fats, and salt.  Take vitamin and mineral supplements as recommended by your health care provider.  Do not drink alcohol if your health care provider tells you not to drink.  If you drink alcohol: ? Limit how much you have to 0-2 drinks a day. ? Be aware of how much alcohol is in your drink. In the [place] one drink equals one 12 oz bottle of beer (355 mL), one 5 oz glass of wine (148 mL), or one 1 oz glass of hard liquor (44 mL). Lifestyle  Take daily care of your teeth and gums.  Stay active. Exercise for at least 30 minutes on 5 or more days each week.  Do not use any products that contain nicotine or tobacco, such as cigarettes, e-cigarettes, and chewing tobacco. If you need help quitting, ask your health care provider.  If you are sexually active, practice safe sex. Use a condom or other form of protection to prevent STIs (sexually transmitted infections).  Talk with your health care provider about taking a low-dose aspirin or statin. What's next?  Visit your health care provider once a year for a well check visit.  Ask your health care provider how often you should have your eyes and teeth checked.  Stay up to date on all vaccines. This information is not intended to replace advice given to you by your health care provider. Make sure you discuss any questions you have with your health care provider. Document Revised: 06/22/2018 Document Reviewed: 06/22/2018 Elsevier Patient Education  2020 [employer].

## 2019-08-14 NOTE — Progress Notes (Signed)
Patient presents to clinic today for annual exam.  Patient is fasting for labs.  Acute Concerns: Patient denies acute concerns at today's visit.  Chronic Issues: PAF --patient currently on a regimen of Eliquis 5 mg twice daily, Lopressor 25 mg twice daily.  Is followed by cardiology in [place].  Patient endorses taking medications as directed. Patient denies chest pain, palpitations, lightheadedness, dizziness, vision changes or frequent headaches.  BP Readings from Last 3 Encounters:  08/14/19 140/80  06/16/19 124/80  06/12/19 (!) 166/105   Hypothyroidism --patient is currently on a regimen of levothyroxine 50 mcg once daily.  Endorses he was taking as directed but has been out of medication for greater than 2 weeks.  BPH --patient currently on a regimen of tamsulosin 0.4 mg.  Endorses taking as directed and noted an improvement in urinary stream and nocturia.  Has been out of this medication for a few weeks and can tell a difference.  Patient due for repeat PSA.  Health Maintenance: Immunizations --patient endorses having flu shot this past season.  Patient due for Pneumovax.  Agrees to this today.  Past Medical History:  Diagnosis Date  . Arthritis   . Atrial fibrillation ([place])    Cardioversion 2016  . Atrial flutter ([place])    Cardioversion 2013  . 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 Dysrhythmia   . GERD (gastroesophageal reflux disease)   . History of aortic valve replacement with bioprosthetic valve    Aortic regurgitation  . History of chicken pox   . History of kidney stones   . History of skin cancer   . History of stroke 2011  . Seasonal allergies   . Stroke [facility])     Past Surgical History:  Procedure Laterality Date  . AORTIC VALVE REPLACEMENT  2009   Bioprosthetic AVR and root replacement  . BACK SURGERY    . EPIGASTRIC HERNIA REPAIR    . HERNIA REPAIR    . LUMBAR LAMINECTOMY/DECOMPRESSION MICRODISCECTOMY Right 06/01/2017   Procedure:  EXTRAFORAMINAL MICRODISCECTOMY LUMBAR FIVE- SACRAL ONE, RIGHT;  Surgeon: Eustace Moore, MD;  Location: [facility];  Service: Neurosurgery;  Laterality: Right;  . LUMBAR LAMINECTOMY/DECOMPRESSION MICRODISCECTOMY Bilateral 09/04/2018   Procedure: Laminectomy and Foraminotomy - Lumbar two-three bilateral;  Surgeon: Eustace Moore, MD;  Location: [facility];  Service: Neurosurgery;  Laterality: Bilateral;  . TRANSFORAMINAL LUMBAR INTERBODY FUSION (TLIF) WITH PEDICLE SCREW FIXATION 1 LEVEL Left 06/15/2019   Procedure: Decompression and Instrumention Fusion Lumbar Four- Five;  Surgeon: Eustace Moore, MD;  Location: [facility];  Service: Neurosurgery;  Laterality: Left;  Decompression and Instrumention Fusion Lumbar Four- Five    Current Outpatient Medications on File Prior to Visit  Medication Sig Dispense Refill  . Cholecalciferol (DIALYVITE VITAMIN D 5000) 125 MCG (5000 UT) capsule Take 5,000 Units by mouth every evening.    . diphenhydrAMINE (BENADRYL) 25 MG tablet Take 25 mg by mouth daily as needed for allergies.    Marland Kitchen ELIQUIS 5 MG TABS tablet Take 1 tablet by mouth daily.    Donnie Aho (GLUCOSAMINE MSM COMPLEX) TABS Take 1 tablet by mouth at bedtime.     Marland Kitchen levothyroxine (SYNTHROID) 50 MCG tablet Take 1 tablet (50 mcg total) by mouth daily before breakfast. Please schedule a physical with your primary care physician 30 tablet 0  . metoprolol tartrate (LOPRESSOR) 25 MG tablet Take by mouth.    . oxyCODONE-acetaminophen (PERCOCET/ROXICET) 5-325 MG tablet Take 1 tablet by mouth every 4 (four)  hours as needed.    . tamsulosin (FLOMAX) 0.4 MG CAPS capsule Take 1 capsule (0.4 mg total) by mouth at bedtime. 30 capsule 0  . Vitamin A 2400 MCG (8000 UT) CAPS Take 2,400 mcg by mouth every evening.    . zinc sulfate 220 (50 Zn) MG capsule Take 220 mg by mouth every evening.     No current facility-administered medications on file prior to visit.    Allergies  Allergen Reactions  . Tape      History reviewed. No pertinent family history.  Social History   Socioeconomic History  . Marital status: Married    Spouse name: Arbie Cookey  . Number of children: Not on file  . Years of education: Not on file  . Highest education level: Not on file  Occupational History  . Not on file  Tobacco Use  . Smoking status: Never Smoker  . Smokeless tobacco: Never Used  Substance and Sexual Activity  . Alcohol use: Yes    Comment: occasional  . Drug use: No  . Sexual activity: Yes  Other Topics Concern  . Not on file  Social History Narrative  . Not on file   Social Determinants of Health   Financial Resource Strain:   . Difficulty of Paying Living Expenses: Not on file  Food Insecurity:   . Worried About Charity fundraiser in the Last Year: Not on file  . Ran Out of Food in the Last Year: Not on file  Transportation Needs:   . Lack of Transportation (Medical): Not on file  . Lack of Transportation (Non-Medical): Not on file  Physical Activity:   . Days of Exercise per Week: Not on file  . Minutes of Exercise per Session: Not on file  Stress:   . Feeling of Stress : Not on file  Social Connections:   . Frequency of Communication with Friends and Family: Not on file  . Frequency of Social Gatherings with Friends and Family: Not on file  . Attends Religious Services: Not on file  . Active Member of Clubs or Organizations: Not on file  . Attends Archivist Meetings: Not on file  . Marital Status: Not on file  Intimate Partner Violence:   . Fear of Current or Ex-Partner: Not on file  . Emotionally Abused: Not on file  . Physically Abused: Not on file  . Sexually Abused: Not on file   ROS Pertinent ROS listed in the HPI.  Wt 203 lb (92.1 kg)   BMI 28.31 kg/m   Physical Exam Vitals reviewed.  Constitutional:      General: He is not in acute distress.    Appearance: He is well-developed. He is not diaphoretic.  HENT:     Head: Normocephalic and  atraumatic.     Right Ear: Tympanic membrane, ear canal and external ear normal.     Left Ear: Tympanic membrane, ear canal and external ear normal.     Nose: Nose normal.     Mouth/Throat:     Pharynx: No posterior oropharyngeal erythema.  Eyes:     Conjunctiva/sclera: Conjunctivae normal.     Pupils: Pupils are equal, round, and reactive to light.  Neck:     Thyroid: No thyromegaly.  Cardiovascular:     Rate and Rhythm: Normal rate and regular rhythm.     Heart sounds: Normal heart sounds.  Pulmonary:     Effort: Pulmonary effort is normal. No respiratory distress.     Breath sounds:  Normal breath sounds. No wheezing or rales.  Chest:     Chest wall: No tenderness.  Abdominal:     General: Bowel sounds are normal. There is no distension.     Palpations: Abdomen is soft. There is no mass.     Tenderness: There is no abdominal tenderness. There is no guarding or rebound.  Musculoskeletal:     Cervical back: Neck supple.  Lymphadenopathy:     Cervical: No cervical adenopathy.  Skin:    General: Skin is warm and dry.     Findings: No rash.  Neurological:     Mental Status: He is alert and oriented to person, place, and time.     Cranial Nerves: No cranial nerve deficit.     Recent Results (from the past 2160 hour(s))  SARS CORONAVIRUS 2 (TAT 6-24 HRS) Nasopharyngeal Nasopharyngeal Swab     Status: None   Collection Time: 06/12/19  7:09 AM   Specimen: Nasopharyngeal Swab  Result Value Ref Range   SARS Coronavirus 2 NEGATIVE NEGATIVE    Comment: (NOTE) SARS-CoV-2 target nucleic acids are NOT DETECTED. The SARS-CoV-2 RNA is generally detectable in upper and lower respiratory specimens during the acute phase of infection. Negative results do not preclude SARS-CoV-2 infection, do not rule out co-infections with other pathogens, and should not be used as the sole basis for treatment or other patient management decisions. Negative results must be combined with clinical  observations, patient history, and epidemiological information. The expected result is Negative. Fact Sheet for Patients: SugarRoll.be Fact Sheet for Healthcare Providers: https://www.woods-mathews.com/ This test is not yet approved or cleared by the [place] FDA and  has been authorized for detection and/or diagnosis of SARS-CoV-2 by FDA under an Emergency Use Authorization (EUA). This EUA will remain  in effect (meaning this test can be used) for the duration of the COVID-19 declaration under Section 56 4(b)(1) of the Act, 21 U.S.C. section 360bbb-3(b)(1), unless the authorization is terminated or revoked sooner. Performed at [facility], [place] [address] [place], [place] [id number]   Basic metabolic panel     Status: Abnormal   Collection Time: 06/12/19  9:00 AM  Result Value Ref Range   Sodium 142 135 - 145 mmol/L   Potassium 4.0 3.5 - 5.1 mmol/L   Chloride 104 98 - 111 mmol/L   CO2 25 22 - 32 mmol/L   Glucose, Bld 110 (H) 70 - 99 mg/dL   BUN 24 (H) 8 - 23 mg/dL   Creatinine, Ser 1.20 0.61 - 1.24 mg/dL   Calcium 8.9 8.9 - 10.3 mg/dL   GFR calc non Af Amer 57 (L) >60 mL/min   GFR calc Af Amer >60 >60 mL/min   Anion gap 13 5 - 15    Comment: Performed at [facility] [address], [place], [place] [address]  CBC     Status: Abnormal   Collection Time: 06/12/19  9:00 AM  Result Value Ref Range   WBC 11.3 (H) 4.0 - 10.5 K/uL   RBC 4.75 4.22 - 5.81 MIL/uL   Hemoglobin 14.9 13.0 - 17.0 g/dL   HCT 45.6 39.0 - 52.0 %   MCV 96.0 80.0 - 100.0 fL   MCH 31.4 26.0 - 34.0 pg   MCHC 32.7 30.0 - 36.0 g/dL   RDW 14.2 11.5 - 15.5 %   Platelets 196 150 - 400 K/uL   nRBC 0.0 0.0 - 0.2 %    Comment: Performed at [facility], [place], [place] [address]  PT- INR at PAT visit (Pre-admission Testing)     Status: None   Collection Time: 06/12/19  9:00 AM  Result Value Ref Range   Prothrombin Time 13.7 11.4 -  15.2 seconds   INR 1.1 0.8 - 1.2    Comment: (NOTE) INR goal varies based on device and disease states. Performed at [facility], [place] [address], [place], [place] [address]   Surgical pcr screen     Status: None   Collection Time: 06/12/19  9:12 AM   Specimen: Nasal Mucosa; Nasal Swab  Result Value Ref Range   MRSA, PCR NEGATIVE NEGATIVE   Staphylococcus aureus NEGATIVE NEGATIVE    Comment: (NOTE) The Xpert SA Assay (FDA approved for NASAL specimens in patients 36 years of age and older), is one component of a comprehensive surveillance program. It is not intended to diagnose infection nor to guide or monitor treatment. Performed at [facility], [place] [address] [place], [place] [address]   Type and screen [place]     Status: None   Collection Time: 06/12/19  9:26 AM  Result Value Ref Range   ABO/RH(D) O POS    Antibody Screen NEG    Sample Expiration 06/26/2019,2359    Extend sample reason      NO TRANSFUSIONS OR PREGNANCY IN THE PAST 3 MONTHS Performed at [facility], [place] [address] [place], [place] [address]   ABO/Rh     Status: None   Collection Time: 06/12/19  9:26 AM  Result Value Ref Range   ABO/RH(D)      O POS Performed at [facility] [address], [place], [place] [address]     Assessment/Plan: 1. Visit for preventive health examination Depression screen negative. Health Maintenance reviewed. Preventive schedule discussed and handout given in AVS. Will obtain fasting labs today.  - CBC with Differential/Platelet - Comprehensive metabolic panel - Lipid panel  2. Need for pneumococcal vaccination Pneumovax given today - Pneumococcal polysaccharide vaccine 23-valent greater than or equal to 2yo subcutaneous/IM  3. PAF (paroxysmal atrial fibrillation) ([place]) Patient in sinus rhythm at time of examination.  Continue management per cardiology.  We will update CMP and lipid panel today. - Comprehensive  metabolic panel - Lipid panel  4. Hypertension, unspecified type BP acceptable for age.  Asymptomatic.  Continue current regimen.  Follow-up with cardiology as scheduled.  Labs today.  5. Acquired hypothyroidism Levothyroxine 50 mcg refilled to pharmacy.  We will have him take 4 to 6 weeks before returning to left have to check TSH.  6. Benign prostatic hyperplasia with nocturia We will repeat PSA level today.  Medication refilled. - PSA  This visit occurred during the SARS-CoV-2 public health emergency.  Safety protocols were in place, including screening questions prior to the visit, additional usage of staff PPE, and extensive cleaning of exam room while observing appropriate contact time as indicated for disinfecting solutions.     Leeanne Rio, PA-C

## 2019-08-16 ENCOUNTER — Other Ambulatory Visit: Payer: Self-pay | Admitting: Emergency Medicine

## 2019-08-16 DIAGNOSIS — R972 Elevated prostate specific antigen [PSA]: Secondary | ICD-10-CM

## 2019-08-16 DIAGNOSIS — D72829 Elevated white blood cell count, unspecified: Secondary | ICD-10-CM

## 2019-08-16 DIAGNOSIS — N401 Enlarged prostate with lower urinary tract symptoms: Secondary | ICD-10-CM

## 2019-08-23 DIAGNOSIS — Z23 Encounter for immunization: Secondary | ICD-10-CM | POA: Diagnosis not present

## 2019-08-23 MED FILL — METHOCARBAMOL 500 MG TABS: 500 | 16 days supply | Qty: 50 | Fill #0

## 2019-09-17 ENCOUNTER — Encounter: Payer: Self-pay | Admitting: Physician Assistant

## 2019-09-17 MED ORDER — LEVOTHYROXINE SODIUM 50 MCG PO TABS: 50.0000 ug | ORAL_TABLET | Freq: Every day | ORAL | 1 refills | Status: DC

## 2019-09-18 MED FILL — LEVOTHYROXINE 50 MCG TABLET: 50 | 30 days supply | Qty: 30 | Fill #0

## 2019-09-18 MED FILL — METHOCARBAMOL 500 MG TABS: 500 | 16 days supply | Qty: 50 | Fill #0

## 2019-09-18 MED FILL — OXYCODONE-ACETAMINOPHEN 5-3: 5-325 | 10 days supply | Qty: 40 | Fill #0

## 2019-09-20 DIAGNOSIS — Z23 Encounter for immunization: Secondary | ICD-10-CM | POA: Diagnosis not present

## 2019-09-25 ENCOUNTER — Other Ambulatory Visit: Payer: Self-pay | Admitting: Physician Assistant

## 2019-09-25 ENCOUNTER — Other Ambulatory Visit: Payer: Self-pay

## 2019-09-25 ENCOUNTER — Ambulatory Visit (INDEPENDENT_AMBULATORY_CARE_PROVIDER_SITE_OTHER): Payer: 59

## 2019-09-25 DIAGNOSIS — E039 Hypothyroidism, unspecified: Secondary | ICD-10-CM

## 2019-09-25 DIAGNOSIS — D72829 Elevated white blood cell count, unspecified: Secondary | ICD-10-CM

## 2019-09-25 LAB — CBC WITH DIFFERENTIAL/PLATELET
Basophils Absolute: 0.1 10*3/uL (ref 0.0–0.1)
Basophils Relative: 0.6 % (ref 0.0–3.0)
Eosinophils Absolute: 0.5 10*3/uL (ref 0.0–0.7)
Eosinophils Relative: 5.8 % — ABNORMAL HIGH (ref 0.0–5.0)
HCT: 40.6 % (ref 39.0–52.0)
Hemoglobin: 13.8 g/dL (ref 13.0–17.0)
Lymphocytes Relative: 16.1 % (ref 12.0–46.0)
Lymphs Abs: 1.4 10*3/uL (ref 0.7–4.0)
MCHC: 34 g/dL (ref 30.0–36.0)
MCV: 88.5 fl (ref 78.0–100.0)
Monocytes Absolute: 0.9 10*3/uL (ref 0.1–1.0)
Monocytes Relative: 9.7 % (ref 3.0–12.0)
Neutro Abs: 5.9 10*3/uL (ref 1.4–7.7)
Neutrophils Relative %: 67.8 % (ref 43.0–77.0)
Platelets: 199 10*3/uL (ref 150.0–400.0)
RBC: 4.58 Mil/uL (ref 4.22–5.81)
RDW: 15.1 % (ref 11.5–15.5)
WBC: 8.7 10*3/uL (ref 4.0–10.5)

## 2019-09-25 LAB — TSH: TSH: 4.23 u[IU]/mL (ref 0.35–4.50)

## 2019-09-28 ENCOUNTER — Other Ambulatory Visit: Payer: Self-pay

## 2019-09-28 ENCOUNTER — Emergency Department (HOSPITAL_COMMUNITY): Payer: 59

## 2019-09-28 ENCOUNTER — Emergency Department (HOSPITAL_COMMUNITY)
Admission: EM | Admit: 2019-09-28 | Discharge: 2019-09-28 | Disposition: A | Discharge status: Home / Self Care | Payer: 59 | Attending: Emergency Medicine | Admitting: Emergency Medicine

## 2019-09-28 ENCOUNTER — Encounter (HOSPITAL_COMMUNITY): Payer: Self-pay | Admitting: Emergency Medicine

## 2019-09-28 DIAGNOSIS — Z952 Presence of prosthetic heart valve: Secondary | ICD-10-CM | POA: Diagnosis not present

## 2019-09-28 DIAGNOSIS — Z79899 Other long term (current) drug therapy: Secondary | ICD-10-CM | POA: Insufficient documentation

## 2019-09-28 DIAGNOSIS — K5732 Diverticulitis of large intestine without perforation or abscess without bleeding: Secondary | ICD-10-CM | POA: Diagnosis not present

## 2019-09-28 DIAGNOSIS — R112 Nausea with vomiting, unspecified: Secondary | ICD-10-CM | POA: Insufficient documentation

## 2019-09-28 DIAGNOSIS — K5792 Diverticulitis of intestine, part unspecified, without perforation or abscess without bleeding: Secondary | ICD-10-CM | POA: Insufficient documentation

## 2019-09-28 DIAGNOSIS — E039 Hypothyroidism, unspecified: Secondary | ICD-10-CM | POA: Insufficient documentation

## 2019-09-28 DIAGNOSIS — Z8673 Personal history of transient ischemic attack (TIA), and cerebral infarction without residual deficits: Secondary | ICD-10-CM | POA: Insufficient documentation

## 2019-09-28 DIAGNOSIS — Z85828 Personal history of other malignant neoplasm of skin: Secondary | ICD-10-CM | POA: Diagnosis not present

## 2019-09-28 DIAGNOSIS — N201 Calculus of ureter: Secondary | ICD-10-CM | POA: Insufficient documentation

## 2019-09-28 DIAGNOSIS — N132 Hydronephrosis with renal and ureteral calculous obstruction: Secondary | ICD-10-CM | POA: Diagnosis not present

## 2019-09-28 DIAGNOSIS — Z7901 Long term (current) use of anticoagulants: Secondary | ICD-10-CM | POA: Diagnosis not present

## 2019-09-28 DIAGNOSIS — R319 Hematuria, unspecified: Secondary | ICD-10-CM | POA: Diagnosis not present

## 2019-09-28 DIAGNOSIS — R109 Unspecified abdominal pain: Secondary | ICD-10-CM | POA: Diagnosis present

## 2019-09-28 LAB — BASIC METABOLIC PANEL
Anion gap: 10 (ref 5–15)
BUN: 18 mg/dL (ref 8–23)
CO2: 23 mmol/L (ref 22–32)
Calcium: 8.3 mg/dL — ABNORMAL LOW (ref 8.9–10.3)
Chloride: 106 mmol/L (ref 98–111)
Creatinine, Ser: 0.89 mg/dL (ref 0.61–1.24)
GFR calc Af Amer: >60 mL/min (ref >60)
GFR calc non Af Amer: >60 mL/min (ref >60)
Glucose, Bld: 132 mg/dL — ABNORMAL HIGH (ref 70–99)
Potassium: 3.4 mmol/L — ABNORMAL LOW (ref 3.5–5.1)
Sodium: 139 mmol/L (ref 135–145)

## 2019-09-28 LAB — URINALYSIS, ROUTINE W REFLEX MICROSCOPIC
Bacteria, UA: NONE SEEN
Bilirubin Urine: NEGATIVE
Glucose, UA: NEGATIVE mg/dL
Ketones, ur: NEGATIVE mg/dL
Leukocytes,Ua: NEGATIVE
Nitrite: NEGATIVE
Protein, ur: 30 mg/dL — AB
RBC / HPF: >50 RBC/hpf — ABNORMAL HIGH (ref 0–5)
Specific Gravity, Urine: 1.023 (ref 1.005–1.030)
pH: 5 (ref 5.0–8.0)

## 2019-09-28 LAB — CBC WITH DIFFERENTIAL/PLATELET
Abs Immature Granulocytes: 0.08 10*3/uL — ABNORMAL HIGH (ref 0.00–0.07)
Basophils Absolute: 0 10*3/uL (ref 0.0–0.1)
Basophils Relative: 0 %
Eosinophils Absolute: 0.5 10*3/uL (ref 0.0–0.5)
Eosinophils Relative: 7 %
HCT: 40.3 % (ref 39.0–52.0)
Hemoglobin: 13.1 g/dL (ref 13.0–17.0)
Immature Granulocytes: 1 %
Lymphocytes Relative: 15 %
Lymphs Abs: 1.2 10*3/uL (ref 0.7–4.0)
MCH: 29.1 pg (ref 26.0–34.0)
MCHC: 32.5 g/dL (ref 30.0–36.0)
MCV: 89.6 fL (ref 80.0–100.0)
Monocytes Absolute: 0.7 10*3/uL (ref 0.1–1.0)
Monocytes Relative: 10 %
Neutro Abs: 5.2 10*3/uL (ref 1.7–7.7)
Neutrophils Relative %: 67 %
Platelets: 212 10*3/uL (ref 150–400)
RBC: 4.5 MIL/uL (ref 4.22–5.81)
RDW: 14.4 % (ref 11.5–15.5)
WBC: 7.7 10*3/uL (ref 4.0–10.5)
nRBC: 0 % (ref 0.0–0.2)

## 2019-09-28 MED ORDER — OXYCODONE-ACETAMINOPHEN 5-325 MG PO TABS: 1.0000 | ORAL_TABLET | ORAL | 0 refills | Status: DC | PRN

## 2019-09-28 MED ORDER — HYDROMORPHONE HCL 1 MG/ML IJ SOLN
0.5000 mg | Freq: Once | INTRAMUSCULAR | Status: AC
  Administered 2019-09-28: 0.5 mg via INTRAVENOUS
  Filled 2019-09-28: qty 1

## 2019-09-28 MED ORDER — METRONIDAZOLE 500 MG PO TABS: 500.0000 mg | ORAL_TABLET | Freq: Two times a day (BID) | ORAL | 0 refills | Status: DC

## 2019-09-28 MED ORDER — CIPROFLOXACIN HCL 500 MG PO TABS: 500.0000 mg | ORAL_TABLET | Freq: Two times a day (BID) | ORAL | 0 refills | Status: DC

## 2019-09-28 MED FILL — OXYCODONE-ACETAMINOPHEN 5-3: 5-325 | 2 days supply | Qty: 12 | Fill #0

## 2019-09-28 MED FILL — METRONIDAZOLE 500 MG TABS: 500 | 10 days supply | Qty: 20 | Fill #0

## 2019-09-28 MED FILL — CIPROFLOXACIN HCL 500 MG TA: 500 | 10 days supply | Qty: 20 | Fill #0

## 2019-09-28 NOTE — ED Provider Notes (Signed)
[facility] EMERGENCY DEPARTMENT Provider Note   CSN: [id number] Arrival date & time: 09/28/19  0744     History Chief Complaint  Patient presents with  . Flank Pain    Keith [name]. is a 79 y.o. male.  HPI      Keith [name]. is a 79 y.o. male with past medical history of atrial fibrillation, previous stroke and aortic valve replacement, he is anticoagulated on Eliquis.  Who presents to the Emergency Department complaining of right-sided flank pain.  He reports having waxing and waning symptoms for 3 weeks.  At times, he states the pain is also been on his left flank area.  Pain occasionally radiates to his groin area.  He states pain intensified last evening and became more right-sided.  Nothing makes his symptoms better or worse.  Pain is been associated with nausea and vomiting and hematuria.  He states he has had kidney stones in the past and states his current symptoms feel similar.  He has an appointment with a urologist but states he could not be seen until late April.  He denies fever, chills, chest pain or shortness of breath.  He also denies any burning with urination, pain or swelling of his testicles.   Past Medical History:  Diagnosis Date  . Arthritis   . Atrial fibrillation ([place])    Cardioversion 2016  . Atrial flutter ([place])    Cardioversion 2013  . 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 Dysrhythmia   . GERD (gastroesophageal reflux disease)   . History of aortic valve replacement with bioprosthetic valve    Aortic regurgitation  . History of chicken pox   . History of kidney stones   . History of skin cancer   . History of stroke 2011  . Seasonal allergies   . Stroke [facility])     Patient Active Problem List   Diagnosis Date Noted  . S/P lumbar fusion 06/15/2019  . Visit for preventive health examination 09/12/2017  . Need for vaccination against Streptococcus pneumoniae using pneumococcal conjugate vaccine 13 09/12/2017  . Acquired  hypothyroidism 08/10/2017  . Benign prostatic hyperplasia with nocturia 08/10/2017  . S/P lumbar laminectomy 06/01/2017  . PAF (paroxysmal atrial fibrillation) ([place]) 09/07/2016  . S/P AVR 01/18/2014  . Hypertension 02/22/2013    Past Surgical History:  Procedure Laterality Date  . AORTIC VALVE REPLACEMENT  2009   Bioprosthetic AVR and root replacement  . BACK SURGERY    . EPIGASTRIC HERNIA REPAIR    . HERNIA REPAIR    . LUMBAR LAMINECTOMY/DECOMPRESSION MICRODISCECTOMY Right 06/01/2017   Procedure: EXTRAFORAMINAL MICRODISCECTOMY LUMBAR FIVE- SACRAL ONE, RIGHT;  Surgeon: Eustace Moore, MD;  Location: [place];  Service: Neurosurgery;  Laterality: Right;  . LUMBAR LAMINECTOMY/DECOMPRESSION MICRODISCECTOMY Bilateral 09/04/2018   Procedure: Laminectomy and Foraminotomy - Lumbar two-three bilateral;  Surgeon: Eustace Moore, MD;  Location: [place];  Service: Neurosurgery;  Laterality: Bilateral;  . TRANSFORAMINAL LUMBAR INTERBODY FUSION (TLIF) WITH PEDICLE SCREW FIXATION 1 LEVEL Left 06/15/2019   Procedure: Decompression and Instrumention Fusion Lumbar Four- Five;  Surgeon: Eustace Moore, MD;  Location: [place];  Service: Neurosurgery;  Laterality: Left;  Decompression and Instrumention Fusion Lumbar Four- Five       No family history on file.  Social History   Tobacco Use  . Smoking status: Never Smoker  . Smokeless tobacco: Never Used  Substance Use Topics  . Alcohol use: Yes    Comment: occasional  .  Drug use: No    Home Medications Prior to Admission medications   Medication Sig Start Date End Date Taking? Authorizing Provider  Cholecalciferol (DIALYVITE VITAMIN D 5000) 125 MCG (5000 UT) capsule Take 5,000 Units by mouth every evening.    [provider]  diphenhydrAMINE (BENADRYL) 25 MG tablet Take 25 mg by mouth daily as needed for allergies.    [provider]  ELIQUIS 5 MG TABS tablet Take 1 tablet by mouth daily. 06/25/19   [provider]   Glucos-MSM-C-Mn-Ginger-Willow (GLUCOSAMINE MSM COMPLEX) TABS Take 1 tablet by mouth at bedtime.     [provider]  levothyroxine (SYNTHROID) 50 MCG tablet Take 1 tablet (50 mcg total) by mouth daily before breakfast. Please schedule a physical with your primary care physician 09/17/19   Brunetta Jeans, PA-C  metoprolol tartrate (LOPRESSOR) 25 MG tablet Take by mouth. 07/18/19 07/18/20  [provider]  oxyCODONE-acetaminophen (PERCOCET/ROXICET) 5-325 MG tablet Take 1 tablet by mouth every 4 (four) hours as needed. 06/28/19   [provider]  tamsulosin (FLOMAX) 0.4 MG CAPS capsule Take 1 capsule (0.4 mg total) by mouth at bedtime. 08/14/19   Brunetta Jeans, PA-C  Vitamin A 2400 MCG (8000 UT) CAPS Take 2,400 mcg by mouth every evening.    [provider]  zinc sulfate 220 (50 Zn) MG capsule Take 220 mg by mouth every evening.    [provider]    Allergies    Tape  Review of Systems   Review of Systems  Constitutional: Negative for activity change, appetite change, chills and fever.  Respiratory: Negative for chest tightness and shortness of breath.   Cardiovascular: Negative for chest pain.  Gastrointestinal: Positive for nausea and vomiting. Negative for abdominal pain.  Genitourinary: Positive for dysuria, flank pain and frequency. Negative for decreased urine volume, difficulty urinating, discharge, hematuria, penile swelling, scrotal swelling, testicular pain and urgency.  Musculoskeletal: Negative for back pain.  Skin: Negative for rash.  Neurological: Negative for dizziness, weakness and numbness.  Hematological: Negative for adenopathy.  Psychiatric/Behavioral: Negative for confusion.    Physical Exam Updated Vital Signs BP 132/88 (BP Location: Right Arm)   Pulse 68   Temp 98.5 F (36.9 C) (Oral)   Resp 17   Ht 5\' 11"  (1.803 m)   Wt 90.7 kg   SpO2 95%   BMI 27.89 kg/m   Physical Exam Vitals and nursing note reviewed.   Constitutional:      Appearance: Normal appearance. He is not ill-appearing.  HENT:     Mouth/Throat:     Mouth: Mucous membranes are moist.  Cardiovascular:     Rate and Rhythm: Normal rate. Rhythm irregular.     Pulses: Normal pulses.  Pulmonary:     Effort: Pulmonary effort is normal.     Breath sounds: Normal breath sounds.  Chest:     Chest wall: No tenderness.  Abdominal:     General: There is no distension.     Palpations: Abdomen is soft.     Tenderness: There is no abdominal tenderness. There is no right CVA tenderness or left CVA tenderness.  Musculoskeletal:        General: Normal range of motion.     Right lower leg: No edema.     Left lower leg: No edema.  Skin:    General: Skin is warm.     Capillary Refill: Capillary refill takes less than 2 seconds.     Findings: No rash.  Neurological:  General: No focal deficit present.     Mental Status: He is alert.     Sensory: No sensory deficit.     Motor: No weakness.     ED Results / Procedures / Treatments   Labs (all labs ordered are listed, but only abnormal results are displayed) Labs Reviewed  URINALYSIS, ROUTINE W REFLEX MICROSCOPIC - Abnormal; Notable for the following components:      Result Value   APPearance HAZY (*)    Hgb urine dipstick LARGE (*)    Protein, ur 30 (*)    RBC / HPF >50 (*)    All other components within normal limits  BASIC METABOLIC PANEL - Abnormal; Notable for the following components:   Potassium 3.4 (*)    Glucose, Bld 132 (*)    Calcium 8.3 (*)    All other components within normal limits  CBC WITH DIFFERENTIAL/PLATELET - Abnormal; Notable for the following components:   Abs Immature Granulocytes 0.08 (*)    All other components within normal limits  URINE CULTURE    EKG None  Radiology CT Renal Stone Study  Result Date: 09/28/2019 CLINICAL DATA:  Intermittent bilateral flank pain. Bladder pain for 3 weeks. Hematuria. Occasional vomiting. Kidney stones with  lithotripsy. Hernia repair. EXAM: CT ABDOMEN AND PELVIS WITHOUT CONTRAST TECHNIQUE: Multidetector CT imaging of the abdomen and pelvis was performed following the standard protocol without IV contrast. COMPARISON:  None. FINDINGS: Lower chest: Clear lung bases. Mild cardiomegaly, without pericardial or pleural effusion. Tiny hiatal hernia. Fluid within the distal esophagus on 02/02. Hepatobiliary: Moderate hepatic steatosis. Normal gallbladder, without biliary ductal dilatation. Pancreas: Normal, without mass or ductal dilatation. Spleen: Normal in size, without focal abnormality. Adrenals/Urinary Tract: Normal adrenal glands. Multiple bilateral renal collecting system calculi. Mild renal cortical thinning bilaterally. Moderate right-sided hydroureteronephrosis to the level of an 8 x 13 mm stone or conglomeration of stones on transverse image 40/2 and coronal image 55/6. No bladder calculi. No air within the bladder. Although there is no significant left hydroureter, stone or stones within the mid to distal left ureter measures 6 mm on 59/2 and 66/6 coronal. Stomach/Bowel: Normal remainder of the stomach. Extensive colonic diverticulosis. Marked thickening of and mild edema surrounding the sigmoid, including on images 63-66 of series 2. No well-defined pericolonic fluid collection and no evidence of free perforation. Normal terminal ileum and appendix. Normal small bowel. Vascular/Lymphatic: Aortic atherosclerosis. Nonspecific increased density in small bowel mesenteric fat with small nodes within. Reproductive: Moderate prostatomegaly. Other: No significant free fluid. Small bilateral fat containing inguinal hernias. Musculoskeletal: L4-5 trans pedicle screw fixation. Advanced lumbosacral spondylosis. IMPRESSION: 1. Moderate right-sided hydroureteronephrosis to the level of a 13 mm stone or conglomeration of stones in the mid to distal right ureter. 2. Left mid to distal ureteric calculus, without significant  left-sided hydroureter. 3. Sigmoid colonic wall thickening and pericolonic edema. Most likely related to diverticulitis. Given the extent of wall thickening, underlying neoplasm cannot be excluded. Consider colonoscopy follow-up. 4. Hepatic steatosis. 5. Tiny hiatal hernia. Esophageal air fluid level suggests dysmotility or gastroesophageal reflux. 6. Prostatomegaly. 7.  Aortic Atherosclerosis (ICD10-I70.0). Electronically Signed   By: Abigail Miyamoto M.D.   On: 09/28/2019 09:11    Procedures Procedures (including critical care time)  Medications Ordered in ED Medications  HYDROmorphone (DILAUDID) injection 0.5 mg (0.5 mg Intravenous Given 09/28/19 0819)  HYDROmorphone (DILAUDID) injection 0.5 mg (0.5 mg Intravenous Given 09/28/19 1005)    ED Course  I have reviewed the triage vital signs  and the nursing notes.  Pertinent labs & imaging results that were available during my care of the patient were reviewed by me and considered in my medical decision making (see chart for details).    MDM Rules/Calculators/A&P                      Patient with waxing and waning bilateral flank pain for 3 weeks.  Symptoms became worse and lateralized on the right since yesterday.  He has a history of kidney stones.  Kidney functions are within normal limits, CT scan shows a possible conglomeration of stones or a possible 13 mm stone with hydroureteral nephrosis on the right and distal ureteric calculus on the left as well without hydroureter.  Of note, there is also wall thickening of the sigmoid colon and possible diverticulitis. Last colonoscopy greater than 10 years ago    This will likely need treatment with antibiotics and close follow-up with GI.  On recheck, patient is feeling better and pain has improved after IV Dilaudid.  No nausea or vomiting here.  I have consulted urology, Dr. Diona Fanti, he has reviewed patient's imaging and recommends follow-up in office early next week.  Patient has also been  given strict return precautions    Final Clinical Impression(s) / ED Diagnoses Final diagnoses:  Ureterolithiasis  Diverticulitis    Rx / DC Orders ED Discharge Orders    None       Kem Parkinson, PA-C 09/29/19 0802    Fredia Sorrow, MD 09/29/19 [phone]

## 2019-09-28 NOTE — ED Provider Notes (Signed)
Medical screening examination/treatment/procedure(s) were conducted as a shared visit with non-physician practitioner(s) and myself.  I personally evaluated the patient during the encounter.       Patient seen by me along with physician assistant. Patient with 3-week complaint of right flank pain. Associated with some intermittent nausea and vomiting. Patient has a history of kidney stones. He is fairly certain that this is related to a kidney stone. He is normally followed by urology in [place]. But he is now up here but travels back and forth. He had an appointment set up with [facility] here in [place] for the end of the month. This would be the first time that they have seen him.  Patient currently in no acute distress. CT scans been ordered as well as labs. This will help with the disposition and termination of the flank pain.   Fredia Sorrow, MD 09/28/19 [phone]

## 2019-09-28 NOTE — Discharge Instructions (Signed)
Take the antibiotics as directed until they are finished.  You may also take the Percocet as needed every 4 hours for pain.  Try to take a stool softener along with the pain medication as it can cause constipation.  Call [facility] to arrange a follow-up appointment for early next week.  Return to the emergency department if you develop worsening pain, fever, or persistent vomiting.

## 2019-09-28 NOTE — ED Notes (Signed)
Pt transported to CT ?

## 2019-09-28 NOTE — ED Triage Notes (Signed)
Pain to RT lower back for a few weeks.  Hx of kidney stones.  Has appt with urologist next month but couldn't take the pain

## 2019-09-29 LAB — URINE CULTURE: Culture: NO GROWTH

## 2019-10-01 ENCOUNTER — Other Ambulatory Visit: Payer: Self-pay | Admitting: Urology

## 2019-10-01 ENCOUNTER — Other Ambulatory Visit: Payer: Self-pay

## 2019-10-01 ENCOUNTER — Ambulatory Visit (HOSPITAL_COMMUNITY)
Admission: AD | Admit: 2019-10-01 | Discharge: 2019-10-01 | Disposition: A | Discharge status: Home / Self Care | Payer: 59 | Source: Ambulatory Visit | Attending: Urology | Admitting: Urology

## 2019-10-01 ENCOUNTER — Ambulatory Visit (HOSPITAL_COMMUNITY): Payer: 59

## 2019-10-01 ENCOUNTER — Ambulatory Visit (HOSPITAL_COMMUNITY): Payer: 59 | Admitting: Anesthesiology

## 2019-10-01 ENCOUNTER — Encounter (HOSPITAL_COMMUNITY): Payer: Self-pay | Admitting: Urology

## 2019-10-01 ENCOUNTER — Encounter (HOSPITAL_COMMUNITY)
Admission: AD | Disposition: A | Discharge status: Home / Self Care | Payer: Self-pay | Source: Ambulatory Visit | Attending: Urology

## 2019-10-01 DIAGNOSIS — Z7901 Long term (current) use of anticoagulants: Secondary | ICD-10-CM | POA: Diagnosis not present

## 2019-10-01 DIAGNOSIS — N320 Bladder-neck obstruction: Secondary | ICD-10-CM | POA: Diagnosis not present

## 2019-10-01 DIAGNOSIS — I10 Essential (primary) hypertension: Secondary | ICD-10-CM | POA: Diagnosis not present

## 2019-10-01 DIAGNOSIS — N201 Calculus of ureter: Secondary | ICD-10-CM

## 2019-10-01 DIAGNOSIS — I4891 Unspecified atrial fibrillation: Secondary | ICD-10-CM | POA: Insufficient documentation

## 2019-10-01 DIAGNOSIS — N138 Other obstructive and reflux uropathy: Secondary | ICD-10-CM | POA: Diagnosis not present

## 2019-10-01 DIAGNOSIS — Z8673 Personal history of transient ischemic attack (TIA), and cerebral infarction without residual deficits: Secondary | ICD-10-CM | POA: Insufficient documentation

## 2019-10-01 DIAGNOSIS — M1990 Unspecified osteoarthritis, unspecified site: Secondary | ICD-10-CM | POA: Insufficient documentation

## 2019-10-01 DIAGNOSIS — Z20822 Contact with and (suspected) exposure to covid-19: Secondary | ICD-10-CM | POA: Diagnosis not present

## 2019-10-01 DIAGNOSIS — Z79899 Other long term (current) drug therapy: Secondary | ICD-10-CM | POA: Diagnosis not present

## 2019-10-01 DIAGNOSIS — K219 Gastro-esophageal reflux disease without esophagitis: Secondary | ICD-10-CM | POA: Diagnosis not present

## 2019-10-01 DIAGNOSIS — N401 Enlarged prostate with lower urinary tract symptoms: Secondary | ICD-10-CM | POA: Diagnosis not present

## 2019-10-01 DIAGNOSIS — N132 Hydronephrosis with renal and ureteral calculous obstruction: Secondary | ICD-10-CM | POA: Insufficient documentation

## 2019-10-01 DIAGNOSIS — E039 Hypothyroidism, unspecified: Secondary | ICD-10-CM | POA: Insufficient documentation

## 2019-10-01 DIAGNOSIS — R310 Gross hematuria: Secondary | ICD-10-CM | POA: Diagnosis not present

## 2019-10-01 DIAGNOSIS — R351 Nocturia: Secondary | ICD-10-CM | POA: Diagnosis not present

## 2019-10-01 HISTORY — PX: CYSTOSCOPY WITH RETROGRADE PYELOGRAM, URETEROSCOPY AND STENT PLACEMENT: SHX5789

## 2019-10-01 LAB — RESPIRATORY PANEL BY RT PCR (FLU A&B, COVID)
Influenza A by PCR: NEGATIVE
Influenza B by PCR: NEGATIVE
SARS Coronavirus 2 by RT PCR: NEGATIVE

## 2019-10-01 SURGERY — CYSTOURETEROSCOPY, WITH RETROGRADE PYELOGRAM AND STENT INSERTION
Anesthesia: General | Laterality: Bilateral

## 2019-10-01 MED ORDER — PROPOFOL 10 MG/ML IV BOLUS
INTRAVENOUS | Status: DC | PRN
  Administered 2019-10-01: 150 mg via INTRAVENOUS

## 2019-10-01 MED ORDER — FENTANYL CITRATE (PF) 100 MCG/2ML IJ SOLN
INTRAMUSCULAR | Status: DC | PRN
  Administered 2019-10-01 (×6): 50 ug via INTRAVENOUS

## 2019-10-01 MED ORDER — ONDANSETRON HCL 4 MG/2ML IJ SOLN
INTRAMUSCULAR | Status: AC
  Filled 2019-10-01: qty 2

## 2019-10-01 MED ORDER — ONDANSETRON HCL 4 MG/2ML IJ SOLN
INTRAMUSCULAR | Status: DC | PRN
  Administered 2019-10-01: 4 mg via INTRAVENOUS

## 2019-10-01 MED ORDER — ACETAMINOPHEN 325 MG PO TABS: 650.0000 mg | ORAL_TABLET | ORAL | Status: DC | PRN

## 2019-10-01 MED ORDER — FENTANYL CITRATE (PF) 100 MCG/2ML IJ SOLN
25.0000 ug | INTRAMUSCULAR | Status: DC | PRN
  Administered 2019-10-01: 50 ug via INTRAVENOUS

## 2019-10-01 MED ORDER — PROPOFOL 10 MG/ML IV BOLUS
INTRAVENOUS | Status: AC
  Filled 2019-10-01: qty 20

## 2019-10-01 MED ORDER — ACETAMINOPHEN 650 MG RE SUPP
650.0000 mg | RECTAL | Status: DC | PRN
  Filled 2019-10-01: qty 1

## 2019-10-01 MED ORDER — FENTANYL CITRATE (PF) 100 MCG/2ML IJ SOLN
INTRAMUSCULAR | Status: AC
  Filled 2019-10-01: qty 2

## 2019-10-01 MED ORDER — PHENYLEPHRINE 40 MCG/ML (10ML) SYRINGE FOR IV PUSH (FOR BLOOD PRESSURE SUPPORT)
PREFILLED_SYRINGE | INTRAVENOUS | Status: DC | PRN
  Administered 2019-10-01: 120 ug via INTRAVENOUS

## 2019-10-01 MED ORDER — OXYCODONE HCL 5 MG PO TABS
5.0000 mg | ORAL_TABLET | ORAL | Status: DC | PRN
  Administered 2019-10-01: 5 mg via ORAL

## 2019-10-01 MED ORDER — SODIUM CHLORIDE 0.9 % IR SOLN
Status: DC | PRN
  Administered 2019-10-01: 6000 mL

## 2019-10-01 MED ORDER — CEFAZOLIN SODIUM-DEXTROSE 2-4 GM/100ML-% IV SOLN
2.0000 g | INTRAVENOUS | Status: AC
  Administered 2019-10-01: 2 g via INTRAVENOUS
  Filled 2019-10-01: qty 100

## 2019-10-01 MED ORDER — DEXAMETHASONE SODIUM PHOSPHATE 10 MG/ML IJ SOLN
INTRAMUSCULAR | Status: DC | PRN
  Administered 2019-10-01: 5 mg via INTRAVENOUS

## 2019-10-01 MED ORDER — SODIUM CHLORIDE 0.9% FLUSH: 3.0000 mL | INTRAVENOUS | Status: DC | PRN

## 2019-10-01 MED ORDER — PHENAZOPYRIDINE HCL 200 MG PO TABS: 200.0000 mg | ORAL_TABLET | Freq: Three times a day (TID) | ORAL | 2 refills | Status: DC | PRN

## 2019-10-01 MED ORDER — DEXAMETHASONE SODIUM PHOSPHATE 10 MG/ML IJ SOLN
INTRAMUSCULAR | Status: AC
  Filled 2019-10-01: qty 1

## 2019-10-01 MED ORDER — LIDOCAINE 2% (20 MG/ML) 5 ML SYRINGE
INTRAMUSCULAR | Status: DC | PRN
  Administered 2019-10-01: 60 mg via INTRAVENOUS
  Administered 2019-10-01: 40 mg via INTRAVENOUS

## 2019-10-01 MED ORDER — SODIUM CHLORIDE 0.9% FLUSH: 3.0000 mL | Freq: Two times a day (BID) | INTRAVENOUS | Status: DC

## 2019-10-01 MED ORDER — IOHEXOL 300 MG/ML  SOLN
INTRAMUSCULAR | Status: DC | PRN
  Administered 2019-10-01: 10 mL

## 2019-10-01 MED ORDER — LACTATED RINGERS IV SOLN: INTRAVENOUS | Status: DC

## 2019-10-01 MED ORDER — DEXMEDETOMIDINE HCL IN NACL 200 MCG/50ML IV SOLN
INTRAVENOUS | Status: AC
  Filled 2019-10-01: qty 50

## 2019-10-01 MED ORDER — SODIUM CHLORIDE 0.9 % IV SOLN: 250.0000 mL | INTRAVENOUS | Status: DC | PRN

## 2019-10-01 MED ORDER — OXYCODONE HCL 5 MG PO TABS
ORAL_TABLET | ORAL | Status: AC
  Filled 2019-10-01: qty 1

## 2019-10-01 MED FILL — OXYCODONE-ACETAMINOPHEN 5-3: 5-325 | 3 days supply | Qty: 15 | Fill #0

## 2019-10-01 MED FILL — PHENAZOPYRIDINE 200 MG TAB: 200 | 3 days supply | Qty: 10 | Fill #0

## 2019-10-01 SURGICAL SUPPLY — 23 items
BAG URO CATCHER STRL LF (MISCELLANEOUS) ×2 IMPLANT
BASKET STONE NCOMPASS (UROLOGICAL SUPPLIES) IMPLANT
CATH URET 5FR 28IN OPEN ENDED (CATHETERS) IMPLANT
CATH URET DUAL LUMEN 6-10FR 50 (CATHETERS) ×2 IMPLANT
CLOTH BEACON ORANGE TIMEOUT ST (SAFETY) ×2 IMPLANT
EXTRACTOR STONE NITINOL NGAGE (UROLOGICAL SUPPLIES) ×2 IMPLANT
FIBER LASER FLEXIVA 1000 (UROLOGICAL SUPPLIES) IMPLANT
FIBER LASER FLEXIVA 365 (UROLOGICAL SUPPLIES) IMPLANT
FIBER LASER FLEXIVA 550 (UROLOGICAL SUPPLIES) IMPLANT
FIBER LASER TRAC TIP (UROLOGICAL SUPPLIES) ×2 IMPLANT
GLOVE SURG SS PI 8.0 STRL IVOR (GLOVE) IMPLANT
GOWN STRL REUS W/TWL XL LVL3 (GOWN DISPOSABLE) ×2 IMPLANT
GUIDEWIRE STR DUAL SENSOR (WIRE) ×2 IMPLANT
IV NS 1000ML (IV SOLUTION) ×1
IV NS 1000ML BAXH (IV SOLUTION) ×1 IMPLANT
IV NS IRRIG 3000ML ARTHROMATIC (IV SOLUTION) ×2 IMPLANT
KIT TURNOVER KIT A (KITS) IMPLANT
MANIFOLD NEPTUNE II (INSTRUMENTS) ×2 IMPLANT
PACK CYSTO (CUSTOM PROCEDURE TRAY) ×2 IMPLANT
SHEATH URETERAL 12FRX35CM (MISCELLANEOUS) ×2 IMPLANT
STENT URET 6FRX26 CONTOUR (STENTS) ×2 IMPLANT
TUBING CONNECTING 10 (TUBING) ×2 IMPLANT
TUBING UROLOGY SET (TUBING) ×2 IMPLANT

## 2019-10-01 NOTE — Anesthesia Procedure Notes (Signed)
Date/Time: 10/01/2019 5:22 PM Performed by: Cynda Familia, CRNA Oxygen Delivery Method: Simple face mask Placement Confirmation: positive ETCO2 and breath sounds checked- equal and bilateral Dental Injury: Teeth and Oropharynx as per pre-operative assessment

## 2019-10-01 NOTE — Discharge Instructions (Signed)

## 2019-10-01 NOTE — Anesthesia Postprocedure Evaluation (Signed)
Anesthesia Post Note  Patient: Keith [name].  Procedure(s) Performed: CYSTOSCOPY WITH RETROGRADE PYELOGRAM, URETEROSCOPY AND STENT PLACEMENT (Bilateral )     Patient location during evaluation: PACU Anesthesia Type: General Level of consciousness: awake and sedated Pain management: pain level controlled Vital Signs Assessment: post-procedure vital signs reviewed and stable Respiratory status: spontaneous breathing Cardiovascular status: stable Postop Assessment: no apparent nausea or vomiting Anesthetic complications: no    Last Vitals:  Vitals:   10/01/19 1730 10/01/19 1755  BP: (!) 167/81 (!) 157/81  Pulse: (!) 59 (!) 103  Resp: 16 12  Temp: 36.9 C   SpO2: 96% 92%    Last Pain:  Vitals:   10/01/19 1755  TempSrc:   PainSc: 4    Pain Goal: Patients Stated Pain Goal: 3 (10/01/19 1210)                 Huston Foley

## 2019-10-01 NOTE — Anesthesia Preprocedure Evaluation (Addendum)
Anesthesia Evaluation  Patient identified by MRN, date of birth, ID band Patient awake    Reviewed: Allergy & Precautions, NPO status , Patient's Chart, lab work & pertinent test results  Airway Mallampati: I  TM Distance: >3 FB Neck ROM: Full    Dental  (+) Dental Advisory Given, Teeth Intact   Pulmonary neg pulmonary ROS,    breath sounds clear to auscultation       Cardiovascular hypertension, Pt. on home beta blockers + dysrhythmias Atrial Fibrillation  Rhythm:Regular Rate:Abnormal     Neuro/Psych CVA negative psych ROS   GI/Hepatic Neg liver ROS, GERD  ,  Endo/Other  Hypothyroidism   Renal/GU negative Renal ROS  negative genitourinary   Musculoskeletal  (+) Arthritis ,   Abdominal Normal abdominal exam  (+)   Peds  Hematology negative hematology ROS (+)   Anesthesia Other Findings   Reproductive/Obstetrics                           Echo:  - Left ventricle: The cavity size was normal. Wall thickness was  increased in a pattern of mild LVH. Systolic function was normal.  The estimated ejection fraction was in the range of 60% to 65%.  Wall motion was normal; there were no regional wall motion  abnormalities. The study was not technically sufficient to allow  evaluation of LV diastolic dysfunction due to atrial  fibrillation.  - Aortic valve: S/p bioprosthetic aortic valve replacement. There  was no stenosis. There was no regurgitation.  - Left atrium: The atrium was severely dilated.  - Right ventricle: Systolic function was reduced.  - Atrial septum: No defect or patent foramen ovale was identified.   Anesthesia Physical Anesthesia Plan  ASA: III  Anesthesia Plan: General   Post-op Pain Management:    Induction: Intravenous  PONV Risk Score and Plan: 3 and Ondansetron, Treatment may vary due to age or medical condition and Dexamethasone  Airway Management  Planned: LMA  Additional Equipment: None  Intra-op Plan:   Post-operative Plan: Extubation in OR  Informed Consent: I have reviewed the patients History and Physical, chart, labs and discussed the procedure including the risks, benefits and alternatives for the proposed anesthesia with the patient or authorized representative who has indicated his/her understanding and acceptance.     Dental advisory given  Plan Discussed with: CRNA  Anesthesia Plan Comments:        Anesthesia Quick Evaluation

## 2019-10-01 NOTE — Op Note (Signed)
Procedure: 1.  Cystoscopy with bilateral retrograde pyelograms and interpretation. 2.  Bilateral ureteroscopy with holmium laser application, stone extraction and insertion of double-J stents. 3.  Application of fluoroscopy.  Preop diagnosis: Bilateral ureteral stones with right proximal obstruction.  Postop diagnosis: Same.  Surgeon: Dr. Irine Seal.  Anesthesia: General.  Specimen: Stone fragments.  Drains: Bilateral 6 French by 26 cm contour double-J stent without tether.  EBL: None.  Complications: None.  Indications: The patient is an 79 year old white male who was seen in the office today for a 13 mm right proximal ureteral stone cluster and a 6 mm left mid ureteral stone.  He was symptomatic and elected ureteroscopy for therapy.  Procedure: He was given 2 g of Ancef.  A general anesthetic was induced.  He was placed in lithotomy position and fitted with PAS hose.  His perineum and genitalia were prepped with Betadine solution he was draped in usual sterile fashion.  Cystoscopy was performed using a 23 [place] scope and 30 degree lens.  He was found to have a normal urethra.  The external sphincter was intact.  The prostatic urethra was approximately 4 cm in length with trilobar hyperplasia with some obstruction.  Examination of bladder revealed mild to moderate trabeculation.  No mucosal lesions were noted.  He had some small yellowish granules in the bladder suggestive of uric acid stones.  The ureteral orifices were unremarkable.  The left ureteral orifice was cannulated with a 5 [place] open-ended catheter and contrast was instilled.  The retrograde pyelogram demonstrated to filling defects in the left mid ureter without concurrent obstruction consistent with 2 stones instead of 1.  The right ureteral orifice was cannulated with 5 French opening catheter and contrast was instilled.    The right retrograde pyelogram demonstrated normal caliber distal and mid ureter there was a  stone in the mid proximal ureter consistent with the stone seen on CT scan which seemed somewhat impacted but contrast did flow by the stone.  There was proximal hydronephrosis.  A sensor wire was passed up the left ureteral orifice to the kidney and the cystoscope was removed.  A 6.5 French semirigid ureteroscope was then passed alongside the wire without the need for dilation.  The most distal stone was initially identified and was engaged with the 200 m tract tip laser fiber set on 0.5 J and 10 Hz.  The stone was broken into manageable fragments which were then removed using an engage basket.  The more proximal stone was then engaged with the laser and once again broken into manageable fragments.  The remaining fragments were removed to the bladder as well.  Once final inspection demonstrated no residual significant stone fragments ureteroscope was removed.  The cystoscope was reinserted over the wire and a 6 [place] by 26 cm contour double-J stent was passed the kidney under fluoroscopic guidance.  The wire was removed, leaving a good coil in the kidney and a good coil in the bladder.  The stone fragments were then removed from the bladder.  A 5 [place] open-ended catheter was then passed into the right ureteral orifice to aid passage of a sensor wire which was advanced to the level of the stone and was negotiated by the stone into the renal collecting system with only a little bit of difficulty.  The open-ended catheter was advanced over the wire to the renal pelvis and the wire was removed.  There was a brisk hydronephrotic drip confirming intrarenal placement of the catheter.  The  guidewire was then reinserted and the ureteral catheter was removed.  The cystoscope was then removed.  A 35 cm 12/14 French ureteral access sheath inner core was advanced over the wire to the level of the stone without difficulty.  The assembled sheath was then advanced to the level of the stone and the inner core and wire  were then removed.  A dual-lumen digital flexible scope was then used to identify and treat the stone.  The 200 m laser was then used with the settings on 0.8 J and 20 Hz with excellent fragmentation.  The fragments were sequentially removed with the engage basket and several had to be chased back into the upper and midpole for complete fragmentation and removal, but I was able to retrieve all but the smallest fragments and final inspection revealed no significant residual stones.  The sensor wire was then reinserted to the kidney through the ureteroscope and ureteroscope and access sheath were removed.  The cystoscope was reinserted over the wire and a second 6 [place] by 26 cm contour double-J stent was advanced to the right kidney under fluoroscopic guidance.  Wire was removed, leaving a good coil in the kidney and a good coil in the bladder.  The bladder was then drained and the cystoscope was removed.  He was taken down from lithotomy position, his anesthetic was reversed and he was moved recovery in stable condition.  There were no complications.  He will be sent home with a stone fragments to bring to the office at follow-up.

## 2019-10-01 NOTE — Transfer of Care (Signed)
Immediate Anesthesia Transfer of Care Note  Patient: Keith [name].  Procedure(s) Performed: CYSTOSCOPY WITH RETROGRADE PYELOGRAM, URETEROSCOPY AND STENT PLACEMENT (Bilateral )  Patient Location: PACU  Anesthesia Type:General  Level of Consciousness: awake and alert   Airway & Oxygen Therapy: Patient Spontanous Breathing and Patient connected to face mask oxygen  Post-op Assessment: Report given to RN and Post -op Vital signs reviewed and stable  Post vital signs: Reviewed and stable  Last Vitals:  Vitals Value Taken Time  BP 167/81 10/01/19 1731  Temp    Pulse 67 10/01/19 1734  Resp    SpO2 100 % 10/01/19 1734  Vitals shown include unvalidated device data.  Last Pain:  Vitals:   10/01/19 1210  TempSrc:   PainSc: 3       Patients Stated Pain Goal: 3 ([id number])  Complications: No apparent anesthesia complications

## 2019-10-01 NOTE — Anesthesia Procedure Notes (Signed)
Procedure Name: LMA Insertion Date/Time: 10/01/2019 3:45 PM Performed by: Cynda Familia, CRNA Pre-anesthesia Checklist: Patient identified, Emergency Drugs available, Suction available and Patient being monitored Patient Re-evaluated:Patient Re-evaluated prior to induction Oxygen Delivery Method: Circle System Utilized Preoxygenation: Pre-oxygenation with 100% oxygen Induction Type: IV induction Ventilation: Mask ventilation without difficulty LMA: LMA inserted LMA Size: 4.0 Number of attempts: 1 Placement Confirmation: positive ETCO2 Tube secured with: Tape Dental Injury: Teeth and Oropharynx as per pre-operative assessment  Comments: Smooth IV induction Holl[place]-- LMA started by CRNA and advanced by Smith Shae-- bilat BS  LMA insertion atraumatic- teeth and mouth as preop-- bilat BS

## 2019-10-01 NOTE — H&P (Signed)
I have ureteral stone.  HPI: Keith [name] is a 79 year-old male patient who was referred by Brunetta Jeans, PA who is here for ureteral stone.  The problem is on both sides. He first stated noticing pain on approximately 09/03/2019. This is not his first kidney stone. He is currently having flank pain. He denies having back pain, groin pain, nausea, vomiting, fever, and chills. Pain is occuring on both sides.   Keith [name] is an 79 yo WM who has a history of stones and presented to the ER Friday morning with severe right flank pain. He had gross hematuria. He had nausea. He had no frequency or urgency. 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 He takes tamsulosin. He continues to have pain primarily on the right. He ha a CT that showed a 60mm cluster of stones in the right proximal ureter with obstruction and a 47mm stone in the left mid ureter without obstruction. there are small renal stones. He had ESWL remotely but doesn't recall the stone type.      ALLERGIES: None   MEDICATIONS: Levothyroxine Sodium  Metoprolol Tartrate  Tamsulosin Hcl  Eliquis     GU PSH: ESWL       PSH Notes: Lithotripsy     NON-GU PSH: Aortic valve replacement (tissue) - about 2011 Heart Surgery (Unspecified) Lumbar Laminectomy     GU PMH: None   NON-GU PMH: GERD Skin Cancer, History    FAMILY HISTORY: None   SOCIAL HISTORY: Marital Status: Married Preferred Language: English; Race: White Current Smoking Status: Patient has never smoked.   Tobacco Use Assessment Completed: Used Tobacco in last 30 days? Drinks 1 caffeinated drink per day.    REVIEW OF SYSTEMS:    GU Review Male:   Patient reports frequent urination, get up at night to urinate, stream starts and stops, and trouble starting your stream. Patient denies hard to postpone urination, burning/ pain with urination, leakage of urine, have to strain to urinate , erection problems, and penile pain.  Gastrointestinal (Upper):   Patient reports  nausea. Patient denies vomiting and indigestion/ heartburn.  Gastrointestinal (Lower):   Patient denies diarrhea and constipation.  Constitutional:   Patient denies fever, night sweats, weight loss, and fatigue.  Skin:   Patient denies skin rash/ lesion and itching.  Eyes:   Patient reports blurred vision. Patient denies double vision.  Ears/ Nose/ Throat:   Patient denies sore throat and sinus problems.  Hematologic/Lymphatic:   Patient denies swollen glands and easy bruising.  Cardiovascular:   Patient reports leg swelling. Patient denies chest pains.  Respiratory:   Patient denies cough and shortness of breath.  Endocrine:   Patient denies excessive thirst.  Musculoskeletal:   Patient reports back pain. Patient denies joint pain.  Neurological:   Patient denies headaches and dizziness.  Psychologic:   Patient denies depression and anxiety.   Notes: hematuria weak stream    VITAL SIGNS:      10/01/2019 08:14 AM  Weight 200 lb / 90.72 kg  Height 71 in / 180.34 cm  BP 158/82 mmHg  Heart Rate 93 /min  Temperature 97.3 F / 36.2 C  BMI 27.9 kg/m   MULTI-SYSTEM PHYSICAL EXAMINATION:    Constitutional: Well-nourished. No physical deformities. Normally developed. Good grooming.  Neck: Neck symmetrical, not swollen. Normal tracheal position.  Respiratory: Normal breath sounds. No labored breathing, no use of accessory muscles.   Cardiovascular: Abnormal heart rhythm IRR. NO murmur  Lymphatic: No enlargement, no tenderness of  supraclavicular, neck lymph nodes.  Skin: No paleness, no jaundice, no cyanosis. No lesion, no ulcer, no rash.  Neurologic / Psychiatric: Oriented to time, oriented to place, oriented to person. No depression, no anxiety, no agitation.  Gastrointestinal: Obese abdomen. No mass, no tenderness, no rigidity.   Musculoskeletal: Normal gait and station of head and neck.     PAST DATA REVIEWED:  Source Of History:  Patient  Lab Test Review:   BMP, CBC with Diff   Records Review:   Previous Hospital Records  Urine Test Review:   Urinalysis, Urine Culture  X-Ray Review: K[facility]: Reviewed Films. Discussed With Patient.  [facility]. [facility] Protocol: Reviewed Films. Reviewed Report. Discussed With Patient.    Notes:                     Cr is 0.89. Urine culture was negative.    PROCEDURES:         KUB - [id number]  A single view of the abdomen is obtained.      Patient confirmed No Neulasta OnPro Device.     ASSESSMENT:      ICD-10 Details  1 GU:   Ureteral calculus - N20.1 Bilateral, Acute, Complicated Injury - He has bilateral ureteral stones with a 50mm right proximal and 61mm left mid stone. I discussed ESWL and URS and will get him set up for bilateral URS today. I have reviewed the risks of ureteroscopy including bleeding, infection, ureteral injury, need for a stent or secondary procedures, thrombotic events and anesthetic complications.    PLAN:            Medications New Meds: Oxycodone-Acetaminophen 5 mg-325 mg tablet 1 tablet PO Q 6 H PRN   #15  0 Refill(s)            Orders X-Rays: [facility]          Schedule Return Visit/Planned Activity: ASAP - Schedule Surgery  Procedure: 10/01/2019 - Cysto Uretero Lithotripsy - [phone], bilateral

## 2019-10-05 DIAGNOSIS — H2513 Age-related nuclear cataract, bilateral: Secondary | ICD-10-CM | POA: Diagnosis not present

## 2019-10-05 DIAGNOSIS — H5203 Hypermetropia, bilateral: Secondary | ICD-10-CM | POA: Diagnosis not present

## 2019-10-05 MED FILL — HYDROCODON-APAP 5-325: 5-325 | 4 days supply | Qty: 15 | Fill #0

## 2019-10-06 MED FILL — PREDNISOLONE AC 1% EYE DROP: 1 | 50 days supply | Qty: 10 | Fill #0

## 2019-10-06 MED FILL — GATIFLOXACIN 0.5% EYE DROPS: 0.5 | 15 days supply | Qty: 3 | Fill #0

## 2019-10-10 DIAGNOSIS — H269 Unspecified cataract: Secondary | ICD-10-CM | POA: Insufficient documentation

## 2019-10-11 DIAGNOSIS — N201 Calculus of ureter: Secondary | ICD-10-CM | POA: Diagnosis not present

## 2019-10-15 DIAGNOSIS — R8271 Bacteriuria: Secondary | ICD-10-CM | POA: Diagnosis not present

## 2019-10-15 DIAGNOSIS — N201 Calculus of ureter: Secondary | ICD-10-CM | POA: Diagnosis not present

## 2019-10-18 ENCOUNTER — Other Ambulatory Visit: Payer: Self-pay

## 2019-10-18 ENCOUNTER — Telehealth (INDEPENDENT_AMBULATORY_CARE_PROVIDER_SITE_OTHER): Payer: 59 | Admitting: Physician Assistant

## 2019-10-18 ENCOUNTER — Encounter: Payer: Self-pay | Admitting: Physician Assistant

## 2019-10-18 DIAGNOSIS — R5383 Other fatigue: Secondary | ICD-10-CM | POA: Diagnosis not present

## 2019-10-18 NOTE — Progress Notes (Signed)
I have discussed the procedure for the virtual visit with the patient who has given consent to proceed with assessment and treatment.   Clorissa Gruenberg S Koby Hartfield, CMA     

## 2019-10-18 NOTE — Progress Notes (Signed)
Virtual Visit via Video   I connected with patient on 10/18/19 at 10:30 AM EDT by a video enabled telemedicine application and verified that I am speaking with the correct person using two identifiers.  Location patient: Home Location provider: [facility] Persons participating in the virtual visit: Patient, Provider, Keith (Patina Moore)  I discussed the limitations of evaluation and management by telemedicine and the availability of in person appointments. The patient expressed understanding and agreed to proceed.  Subjective:   HPI:   Patient presents via [facility] today to discuss some symptoms he has noted since his recent ureteroscopy and stent placement for bilateral nephrolithiasis. Notes he did well with procedure. Tolerating stents well. Urinating without issue. Denies flank pain. Has noted since his bout with stones he has noted more significant fatigue. Denies fever, chills, sweats or weight changes. Is hydrating and eating well. Denies feelings of anxiety, depressed mood or anhedonia. Denies chest pain or SOB. Denies any known history of vitamin deficiency. Is questioning the possibility of anemia since he knows there was blood loss with stones and procedure. Of note on further questioning he does endorse significant snoring and daytime sleepiness despite adequate hours of sleep. Wife has noted apneic episodes lately.    ROS:   See pertinent positives and negatives per HPI.  Patient Active Problem List   Diagnosis Date Noted  . Right ureteral stone 10/01/2019  . Left ureteral stone 10/01/2019  . S/P lumbar fusion 06/15/2019  . Visit for preventive health examination 09/12/2017  . Need for vaccination against Streptococcus pneumoniae using pneumococcal conjugate vaccine 13 09/12/2017  . 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 Benign prostatic hyperplasia with nocturia 08/10/2017  . S/P lumbar laminectomy 06/01/2017  . PAF (paroxysmal atrial fibrillation)  ([facility]) 09/07/2016  . S/P AVR 01/18/2014  . Hypertension 02/22/2013    Social History   Tobacco Use  . Smoking status: Never Smoker  . Smokeless tobacco: Never Used  Substance Use Topics  . Alcohol use: Yes    Comment: occasional    Current Outpatient Medications:  .  Cholecalciferol (DIALYVITE VITAMIN D 5000) 125 MCG (5000 UT) capsule, Take 5,000 Units by mouth every evening., Disp: , Rfl:  .  diphenhydrAMINE (BENADRYL) 25 MG tablet, Take 25 mg by mouth daily as needed for allergies., Disp: , Rfl:  .  ELIQUIS 5 MG TABS tablet, Take 1 tablet by mouth daily., Disp: , Rfl:  .  Glucos-MSM-C-Mn-Ginger-Willow (GLUCOSAMINE MSM COMPLEX) TABS, Take 1 tablet by mouth at bedtime. , Disp: , Rfl:  .  levothyroxine (SYNTHROID) 50 MCG tablet, Take 1 tablet (50 mcg total) by mouth daily before breakfast. Please schedule a physical with your primary care physician, Disp: 30 tablet, Rfl: 1 .  methocarbamol (ROBAXIN) 500 MG tablet, Take 500 mg by mouth every 6 (six) hours as needed for muscle spasms. , Disp: , Rfl:  .  metoprolol tartrate (LOPRESSOR) 25 MG tablet, Take 25 mg by mouth daily. , Disp: , Rfl:  .  oxyCODONE-acetaminophen (PERCOCET/ROXICET) 5-325 MG tablet, Take 1 tablet by mouth every 4 (four) hours as needed., Disp: 12 tablet, Rfl: 0 .  tamsulosin (FLOMAX) 0.4 MG CAPS capsule, Take 1 capsule (0.4 mg total) by mouth at bedtime., Disp: 90 capsule, Rfl: 1 .  Vitamin A 2400 MCG (8000 UT) CAPS, Take 2,400 mcg by mouth every evening., Disp: , Rfl:  .  zinc sulfate 220 (50 Zn) MG capsule, Take 220 mg by mouth every evening., Disp: , Rfl:   Allergies  Allergen Reactions  . Tape     Objective:   There were no vitals taken for this visit.  Patient is well-developed, well-nourished in no acute distress.  Resting comfortably at home.  Head is normocephalic, atraumatic.  No labored breathing.  Speech is clear and coherent with logical content.  Patient is alert and oriented at baseline.    Assessment and Plan:   1. Fatigue, unspecified type Discussed anemia from his procedure is unlikely but should assess for anemia in general amongst other things. Will check CBC, CMP, Vitamin D and B12 level. Discussed concern for sleep apnea giving his symptoms mentioned. Recommend Pulmonology assessment for sleep study. He endorses he would like to get lab assessment first and go from there. Discussed with him that either way (labs normal versus abnormality) this should be assessed but we can get labs results and review first. - CBC with Differential/Platelet; Future - Comprehensive metabolic panel; Future - Vitamin D (25 hydroxy); Future - B12 and Folate Panel; Future    Keith Rio, PA-C 10/18/2019

## 2019-10-19 ENCOUNTER — Ambulatory Visit: Payer: 59 | Admitting: Physician Assistant

## 2019-10-22 ENCOUNTER — Other Ambulatory Visit: Payer: Self-pay

## 2019-10-22 ENCOUNTER — Ambulatory Visit (INDEPENDENT_AMBULATORY_CARE_PROVIDER_SITE_OTHER): Payer: 59

## 2019-10-22 DIAGNOSIS — R5383 Other fatigue: Secondary | ICD-10-CM

## 2019-10-22 LAB — CBC WITH DIFFERENTIAL/PLATELET
Basophils Absolute: 0.1 10*3/uL (ref 0.0–0.1)
Basophils Relative: 1.1 % (ref 0.0–3.0)
Eosinophils Absolute: 0.5 10*3/uL (ref 0.0–0.7)
Eosinophils Relative: 9.6 % — ABNORMAL HIGH (ref 0.0–5.0)
HCT: 38.5 % — ABNORMAL LOW (ref 39.0–52.0)
Hemoglobin: 13.1 g/dL (ref 13.0–17.0)
Lymphocytes Relative: 18.5 % (ref 12.0–46.0)
Lymphs Abs: 1 10*3/uL (ref 0.7–4.0)
MCHC: 34.1 g/dL (ref 30.0–36.0)
MCV: 87.6 fl (ref 78.0–100.0)
Monocytes Absolute: 0.7 10*3/uL (ref 0.1–1.0)
Monocytes Relative: 12.8 % — ABNORMAL HIGH (ref 3.0–12.0)
Neutro Abs: 3.3 10*3/uL (ref 1.4–7.7)
Neutrophils Relative %: 58 % (ref 43.0–77.0)
Platelets: 208 10*3/uL (ref 150.0–400.0)
RBC: 4.39 Mil/uL (ref 4.22–5.81)
RDW: 15.2 % (ref 11.5–15.5)
WBC: 5.6 10*3/uL (ref 4.0–10.5)

## 2019-10-22 LAB — COMPREHENSIVE METABOLIC PANEL
ALT: 19 U/L (ref 0–53)
AST: 38 U/L — ABNORMAL HIGH (ref 0–37)
Albumin: 3.1 g/dL — ABNORMAL LOW (ref 3.5–5.2)
Alkaline Phosphatase: 62 U/L (ref 39–117)
BUN: 15 mg/dL (ref 6–23)
CO2: 25 mEq/L (ref 19–32)
Calcium: 8.2 mg/dL — ABNORMAL LOW (ref 8.4–10.5)
Chloride: 106 mEq/L (ref 96–112)
Creatinine, Ser: 0.95 mg/dL (ref 0.40–1.50)
GFR: 76.27 mL/min (ref >60.00)
Glucose, Bld: 123 mg/dL — ABNORMAL HIGH (ref 70–99)
Potassium: 3.5 mEq/L (ref 3.5–5.1)
Sodium: 141 mEq/L (ref 135–145)
Total Bilirubin: 0.8 mg/dL (ref 0.2–1.2)
Total Protein: 5.9 g/dL — ABNORMAL LOW (ref 6.0–8.3)

## 2019-10-22 LAB — B12 AND FOLATE PANEL
Folate: 8.4 ng/mL (ref >5.9)
Vitamin B-12: 191 pg/mL — ABNORMAL LOW (ref 211–911)

## 2019-10-22 LAB — VITAMIN D 25 HYDROXY (VIT D DEFICIENCY, FRACTURES): VITD: 28.5 ng/mL — ABNORMAL LOW (ref 30.00–100.00)

## 2019-10-23 DIAGNOSIS — M4316 Spondylolisthesis, lumbar region: Secondary | ICD-10-CM | POA: Diagnosis not present

## 2019-10-23 MED FILL — OXYCODONE-APAP 5-325MG: 5-325 | 10 days supply | Qty: 40 | Fill #0

## 2019-10-23 MED FILL — METHOCARBAMOL 500 MG TABS: 500 | 16 days supply | Qty: 50 | Fill #0

## 2019-10-24 ENCOUNTER — Other Ambulatory Visit: Payer: Self-pay | Admitting: Emergency Medicine

## 2019-10-24 MED ORDER — VITAMIN B-12 1000 MCG PO TABS: 1000.0000 ug | ORAL_TABLET | Freq: Every day | ORAL | 0 refills | Status: DC

## 2019-10-26 ENCOUNTER — Other Ambulatory Visit: Payer: Self-pay | Admitting: Physician Assistant

## 2019-10-26 ENCOUNTER — Other Ambulatory Visit: Payer: Self-pay

## 2019-10-26 ENCOUNTER — Ambulatory Visit: Payer: 59 | Admitting: Urology

## 2019-10-26 ENCOUNTER — Ambulatory Visit (INDEPENDENT_AMBULATORY_CARE_PROVIDER_SITE_OTHER): Payer: 59 | Admitting: Physician Assistant

## 2019-10-26 ENCOUNTER — Encounter: Payer: Self-pay | Admitting: Physician Assistant

## 2019-10-26 VITALS — BP 130/80 | HR 52 | Temp 98.3°F | Resp 16 | Ht 71.0 in | Wt 207.0 lb

## 2019-10-26 DIAGNOSIS — K219 Gastro-esophageal reflux disease without esophagitis: Secondary | ICD-10-CM | POA: Diagnosis not present

## 2019-10-26 DIAGNOSIS — R110 Nausea: Secondary | ICD-10-CM | POA: Diagnosis not present

## 2019-10-26 MED ORDER — PANTOPRAZOLE SODIUM 40 MG PO TBEC: 40.0000 mg | DELAYED_RELEASE_TABLET | Freq: Every day | ORAL | 3 refills | Status: DC

## 2019-10-26 MED FILL — PANTOPRAZOLE SOD DR 40 MG T: 40 | 30 days supply | Qty: 30 | Fill #0

## 2019-10-26 NOTE — Progress Notes (Signed)
Patient presents to clinic today c/o 3 weeks of nausea mild upset stomach.  States he thought it may have been related to the oxycodone that he was taking for his recent ureterolithiasis.  States he stopped the medication a couple of days ago as he does not need it.  Notes some improvement in nausea since stopping the pain medication.  Denies any vomiting or diarrhea.  Initially had some mild constipation which he also feels was related to his pain medication.  Notes some decrease in appetite secondary to nausea but denies any difficulty swallowing or processing his foods.  Denies fever, chills, malaise or fatigue.  Urinary habits are normal.  Wife gave him some OTC meclizine and Gaviscon for nausea which helped his symptoms.  Patient has noted some heartburn and reflux associated with symptoms.  Denies epigastric pain.  Past Medical History:  Diagnosis Date  . Arthritis   . Atrial fibrillation ([place])    Cardioversion 2016  . Atrial flutter ([place])    Cardioversion 2013  . 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 Dysrhythmia   . GERD (gastroesophageal reflux disease)   . History of aortic valve replacement with bioprosthetic valve    Aortic regurgitation  . History of chicken pox   . History of kidney stones   . History of skin cancer   . History of stroke 2011  . Seasonal allergies   . Stroke [facility])     Current Outpatient Medications on File Prior to Visit  Medication Sig Dispense Refill  . aluminum hydroxide-magnesium carbonate (GAVISCON) 95-358 mg/15 mL SUSP Take 30 mLs by mouth 3 (three) times daily as needed.    . Cholecalciferol (DIALYVITE VITAMIN D 5000) 125 MCG (5000 UT) capsule Take 5,000 Units by mouth every evening.    . diphenhydrAMINE (BENADRYL) 25 MG tablet Take 25 mg by mouth daily as needed for allergies.    Marland Kitchen ELIQUIS 5 MG TABS tablet Take 1 tablet by mouth daily.    Donnie Aho (GLUCOSAMINE MSM COMPLEX) TABS Take 1 tablet by mouth at bedtime.       Marland Kitchen levothyroxine (SYNTHROID) 50 MCG tablet Take 1 tablet (50 mcg total) by mouth daily before breakfast. Please schedule a physical with your primary care physician 30 tablet 1  . meclizine (ANTIVERT) 25 MG tablet Take 1 tablet by mouth every other day.    . methocarbamol (ROBAXIN) 500 MG tablet Take 500 mg by mouth every 6 (six) hours as needed for muscle spasms.     . metoprolol tartrate (LOPRESSOR) 25 MG tablet Take 25 mg by mouth daily.     . promethazine (PHENERGAN) 25 MG tablet Take 1 tablet by mouth 3 (three) times daily as needed.    . tamsulosin (FLOMAX) 0.4 MG CAPS capsule Take 1 capsule (0.4 mg total) by mouth at bedtime. 90 capsule 1  . Vitamin A 2400 MCG (8000 UT) CAPS Take 2,400 mcg by mouth every evening.    . vitamin B-12 (CYANOCOBALAMIN) 1000 MCG tablet Take 1 tablet (1,000 mcg total) by mouth daily. 90 tablet 0  . zinc sulfate 220 (50 Zn) MG capsule Take 220 mg by mouth every evening.    Marland Kitchen oxyCODONE-acetaminophen (PERCOCET/ROXICET) 5-325 MG tablet Take 1 tablet by mouth every 4 (four) hours as needed. (Patient not taking: Reported on 10/26/2019) 12 tablet 0   No current facility-administered medications on file prior to visit.    Allergies  Allergen Reactions  . Tape     History reviewed.  No pertinent family history.  Social History   Socioeconomic History  . Marital status: Married    Spouse name: Arbie Cookey  . Number of children: Not on file  . Years of education: Not on file  . Highest education level: Not on file  Occupational History  . Not on file  Tobacco Use  . Smoking status: Never Smoker  . Smokeless tobacco: Never Used  Substance and Sexual Activity  . Alcohol use: Yes    Comment: occasional  . Drug use: No  . Sexual activity: Yes  Other Topics Concern  . Not on file  Social History Narrative  . Not on file   Social Determinants of Health   Financial Resource Strain:   . Difficulty of Paying Living Expenses:   Food Insecurity:   . Worried About  Charity fundraiser in the Last Year:   . Arboriculturist in the Last Year:   Transportation Needs:   . Film/video editor (Medical):   Marland Kitchen Lack of Transportation (Non-Medical):   Physical Activity:   . Days of Exercise per Week:   . Minutes of Exercise per Session:   Stress:   . Feeling of Stress :   Social Connections:   . Frequency of Communication with Friends and Family:   . Frequency of Social Gatherings with Friends and Family:   . Attends Religious Services:   . Active Member of Clubs or Organizations:   . Attends Archivist Meetings:   Marland Kitchen Marital Status:    Review of Systems - See HPI.  All other ROS are negative.  Wt 207 lb (93.9 kg)   BMI 28.87 kg/m   Physical Exam Vitals reviewed.  Constitutional:      Appearance: Normal appearance.  HENT:     Head: Normocephalic and atraumatic.  Cardiovascular:     Rate and Rhythm: Normal rate and regular rhythm.     Pulses: Normal pulses.     Heart sounds: Normal heart sounds.  Pulmonary:     Effort: Pulmonary effort is normal.  Abdominal:     General: Bowel sounds are normal. There is no distension.     Palpations: Abdomen is soft.     Tenderness: There is no abdominal tenderness.  Musculoskeletal:     Cervical back: Neck supple.  Neurological:     General: No focal deficit present.     Mental Status: He is alert and oriented to person, place, and time.  Psychiatric:        Mood and Affect: Mood normal.     Recent Results (from the past 2160 hour(s))  CBC with Differential/Platelet     Status: Abnormal   Collection Time: 08/14/19 10:28 AM  Result Value Ref Range   WBC 14.8 (H) 4.0 - 10.5 K/uL   RBC 4.93 4.22 - 5.81 Mil/uL   Hemoglobin 14.9 13.0 - 17.0 g/dL   HCT 44.7 39.0 - 52.0 %   MCV 90.7 78.0 - 100.0 fl   MCHC 33.3 30.0 - 36.0 g/dL   RDW 14.6 11.5 - 15.5 %   Platelets 247.0 150.0 - 400.0 K/uL   Neutrophils Relative % 70.8 43.0 - 77.0 %   Lymphocytes Relative 19.6 12.0 - 46.0 %   Monocytes  Relative 8.5 3.0 - 12.0 %   Eosinophils Relative 0.8 0.0 - 5.0 %   Basophils Relative 0.3 0.0 - 3.0 %   Neutro Abs 10.5 (H) 1.4 - 7.7 K/uL   Lymphs Abs 2.9 0.7 -  4.0 K/uL   Monocytes Absolute 1.3 (H) 0.1 - 1.0 K/uL   Eosinophils Absolute 0.1 0.0 - 0.7 K/uL   Basophils Absolute 0.0 0.0 - 0.1 K/uL  Comprehensive metabolic panel     Status: Abnormal   Collection Time: 08/14/19 10:28 AM  Result Value Ref Range   Sodium 141 135 - 145 mEq/L   Potassium 3.7 3.5 - 5.1 mEq/L   Chloride 103 96 - 112 mEq/L   CO2 26 19 - 32 mEq/L   Glucose, Bld 77 70 - 99 mg/dL   BUN 35 (H) 6 - 23 mg/dL   Creatinine, Ser 1.05 0.40 - 1.50 mg/dL   Total Bilirubin 0.8 0.2 - 1.2 mg/dL   Alkaline Phosphatase 88 39 - 117 U/L   AST 23 0 - 37 U/L   ALT 25 0 - 53 U/L   Total Protein 6.8 6.0 - 8.3 g/dL   Albumin 4.2 3.5 - 5.2 g/dL   GFR 67.98 >60.00 mL/min   Calcium 9.0 8.4 - 10.5 mg/dL  Lipid panel     Status: Abnormal   Collection Time: 08/14/19 10:28 AM  Result Value Ref Range   Cholesterol 204 (H) 0 - 200 mg/dL    Comment: ATP III Classification       Desirable:  < 200 mg/dL               Borderline High:  200 - 239 mg/dL          High:  > = 240 mg/dL   Triglycerides 230.0 (H) 0.0 - 149.0 mg/dL    Comment: Normal:  <150 mg/dLBorderline High:  150 - 199 mg/dL   HDL 50.30 >39.00 mg/dL   VLDL 46.0 (H) 0.0 - 40.0 mg/dL   Total CHOL/HDL Ratio 4     Comment:                Men          Women1/2 Average Risk     3.4          3.3Average Risk          5.0          4.42X Average Risk          9.6          7.13X Average Risk          15.0          11.0                       NonHDL 153.82     Comment: NOTE:  Non-HDL goal should be 30 mg/dL higher than patient's LDL goal (i.e. LDL goal of < 70 mg/dL, would have non-HDL goal of < 100 mg/dL)  PSA     Status: None   Collection Time: 08/14/19 10:28 AM  Result Value Ref Range   PSA 3.16 0.10 - 4.00 ng/mL    Comment: Test performed using Access Hybritech PSA Assay, a  parmagnetic partical, chemiluminecent immunoassay.  LDL cholesterol, direct     Status: None   Collection Time: 08/14/19 10:28 AM  Result Value Ref Range   Direct LDL 119.0 mg/dL    Comment: Optimal:  <100 mg/dLNear or Above Optimal:  100-129 mg/dLBorderline High:  130-159 mg/dLHigh:  160-189 mg/dLVery High:  >190 mg/dL  CBC with Differential/Platelet     Status: Abnormal   Collection Time: 09/25/19  8:48 AM  Result Value Ref Range   WBC 8.7 4.0 -  10.5 K/uL   RBC 4.58 4.22 - 5.81 Mil/uL   Hemoglobin 13.8 13.0 - 17.0 g/dL   HCT 40.6 39.0 - 52.0 %   MCV 88.5 78.0 - 100.0 fl   MCHC 34.0 30.0 - 36.0 g/dL   RDW 15.1 11.5 - 15.5 %   Platelets 199.0 150.0 - 400.0 K/uL   Neutrophils Relative % 67.8 43.0 - 77.0 %   Lymphocytes Relative 16.1 12.0 - 46.0 %   Monocytes Relative 9.7 3.0 - 12.0 %   Eosinophils Relative 5.8 (H) 0.0 - 5.0 %   Basophils Relative 0.6 0.0 - 3.0 %   Neutro Abs 5.9 1.4 - 7.7 K/uL   Lymphs Abs 1.4 0.7 - 4.0 K/uL   Monocytes Absolute 0.9 0.1 - 1.0 K/uL   Eosinophils Absolute 0.5 0.0 - 0.7 K/uL   Basophils Absolute 0.1 0.0 - 0.1 K/uL  TSH     Status: None   Collection Time: 09/25/19  8:48 AM  Result Value Ref Range   TSH 4.23 0.35 - 4.50 uIU/mL  Basic metabolic panel     Status: Abnormal   Collection Time: 09/28/19  8:21 AM  Result Value Ref Range   Sodium 139 135 - 145 mmol/L   Potassium 3.4 (L) 3.5 - 5.1 mmol/L   Chloride 106 98 - 111 mmol/L   CO2 23 22 - 32 mmol/L   Glucose, Bld 132 (H) 70 - 99 mg/dL    Comment: Glucose reference range applies only to samples taken after fasting for at least 8 hours.   BUN 18 8 - 23 mg/dL   Creatinine, Ser 0.89 0.61 - 1.24 mg/dL   Calcium 8.3 (L) 8.9 - 10.3 mg/dL   GFR calc non Af Amer >60 >60 mL/min   GFR calc Af Amer >60 >60 mL/min   Anion gap 10 5 - 15    Comment: Performed at [facility], [address], [place], [place] [address]  CBC with Differential     Status: Abnormal   Collection Time: 09/28/19  8:21 AM  Result  Value Ref Range   WBC 7.7 4.0 - 10.5 K/uL   RBC 4.50 4.22 - 5.81 MIL/uL   Hemoglobin 13.1 13.0 - 17.0 g/dL   HCT 40.3 39.0 - 52.0 %   MCV 89.6 80.0 - 100.0 fL   MCH 29.1 26.0 - 34.0 pg   MCHC 32.5 30.0 - 36.0 g/dL   RDW 14.4 11.5 - 15.5 %   Platelets 212 150 - 400 K/uL   nRBC 0.0 0.0 - 0.2 %   Neutrophils Relative % 67 %   Neutro Abs 5.2 1.7 - 7.7 K/uL   Lymphocytes Relative 15 %   Lymphs Abs 1.2 0.7 - 4.0 K/uL   Monocytes Relative 10 %   Monocytes Absolute 0.7 0.1 - 1.0 K/uL   Eosinophils Relative 7 %   Eosinophils Absolute 0.5 0.0 - 0.5 K/uL   Basophils Relative 0 %   Basophils Absolute 0.0 0.0 - 0.1 K/uL   Immature Granulocytes 1 %   Abs Immature Granulocytes 0.08 (H) 0.00 - 0.07 K/uL    Comment: Performed at [facility], [address] [place], [place] [address]  Urinalysis, Routine w reflex microscopic     Status: Abnormal   Collection Time: 09/28/19 11:25 AM  Result Value Ref Range   Color, Urine YELLOW YELLOW   APPearance HAZY (A) CLEAR   Specific Gravity, Urine 1.023 1.005 - 1.030   pH 5.0 5.0 - 8.0   Glucose, UA  NEGATIVE NEGATIVE mg/dL   Hgb urine dipstick LARGE (A) NEGATIVE   Bilirubin Urine NEGATIVE NEGATIVE   Ketones, ur NEGATIVE NEGATIVE mg/dL   Protein, ur 30 (A) NEGATIVE mg/dL   Nitrite NEGATIVE NEGATIVE   Leukocytes,Ua NEGATIVE NEGATIVE   RBC / HPF >50 (H) 0 - 5 RBC/hpf   WBC, UA 21-50 0 - 5 WBC/hpf   Bacteria, UA NONE SEEN NONE SEEN   Mucus PRESENT    Hyaline Casts, UA PRESENT    Uric Acid Crys, UA PRESENT     Comment: Performed at [facility], [address], [place], [place] [address]  Urine culture     Status: None   Collection Time: 09/28/19 11:52 AM   Specimen: Urine, Clean Catch  Result Value Ref Range   Specimen Description      URINE, CLEAN CATCH Performed at [facility], [address] [place], [place] [address]    Special Requests      NONE Performed at [facility], [address] [place], [place] [address]    Culture      NO  GROWTH Performed at [facility], [place] [address] [place],  [address]    Report Status 09/29/2019 FINAL   Respiratory Panel by RT PCR (Flu A&B, Covid) - Nasopharyngeal Swab     Status: None   Collection Time: 10/01/19 11:46 AM   Specimen: Nasopharyngeal Swab  Result Value Ref Range   SARS Coronavirus 2 by RT PCR NEGATIVE NEGATIVE    Comment: (NOTE) SARS-CoV-2 target nucleic acids are NOT DETECTED. The SARS-CoV-2 RNA is generally detectable in upper respiratoy specimens during the acute phase of infection. The lowest concentration of SARS-CoV-2 viral copies this assay can detect is 131 copies/mL. A negative result does not preclude SARS-Cov-2 infection and should not be used as the sole basis for treatment or other patient management decisions. A negative result may occur with  improper specimen collection/handling, submission of specimen other than nasopharyngeal swab, presence of viral mutation(s) within the areas targeted by this assay, and inadequate number of viral copies (<131 copies/mL). A negative result must be combined with clinical observations, patient history, and epidemiological information. The expected result is Negative. Fact Sheet for Patients:  PinkCheek.be Fact Sheet for Healthcare Providers:  GravelBags.it This test is not yet ap proved or cleared by the [place] FDA and  has been authorized for detection and/or diagnosis of SARS-CoV-2 by FDA under an Emergency Use Authorization (EUA). This EUA will remain  in effect (meaning this test can be used) for the duration of the COVID-19 declaration under Section 564(b)(1) of the Act, 21 U.S.C. section 360bbb-3(b)(1), unless the authorization is terminated or revoked sooner.    Influenza A by PCR NEGATIVE NEGATIVE   Influenza B by PCR NEGATIVE NEGATIVE    Comment: (NOTE) The Xpert Xpress SARS-CoV-2/FLU/RSV assay is intended as an aid in  the  diagnosis of influenza from Nasopharyngeal swab specimens and  should not be used as a sole basis for treatment. Nasal washings and  aspirates are unacceptable for Xpert Xpress SARS-CoV-2/FLU/RSV  testing. Fact Sheet for Patients: PinkCheek.be Fact Sheet for Healthcare Providers: GravelBags.it This test is not yet approved or cleared by the [place] FDA and  has been authorized for detection and/or diagnosis of SARS-CoV-2 by  FDA under an Emergency Use Authorization (EUA). This EUA will remain  in effect (meaning this test can be used) for the duration of the  Covid-19 declaration under Section 564(b)(1) of the Act, 21  U.S.C. section 360bbb-3(b)(1), unless the authorization is  terminated or revoked. Performed at [facility], [place] [address] [place], [place] [address]   B12 and Folate Panel     Status: Abnormal   Collection Time: 10/22/19 10:07 AM  Result Value Ref Range   Vitamin B-12 191 (L) 211 - 911 pg/mL   Folate 8.4 >5.9 ng/mL  Vitamin D (25 hydroxy)     Status: Abnormal   Collection Time: 10/22/19 10:07 AM  Result Value Ref Range   VITD 28.50 (L) 30.00 - 100.00 ng/mL  Comprehensive metabolic panel     Status: Abnormal   Collection Time: 10/22/19 10:07 AM  Result Value Ref Range   Sodium 141 135 - 145 mEq/L   Potassium 3.5 3.5 - 5.1 mEq/L   Chloride 106 96 - 112 mEq/L   CO2 25 19 - 32 mEq/L   Glucose, Bld 123 (H) 70 - 99 mg/dL   BUN 15 6 - 23 mg/dL   Creatinine, Ser 0.95 0.40 - 1.50 mg/dL   Total Bilirubin 0.8 0.2 - 1.2 mg/dL   Alkaline Phosphatase 62 39 - 117 U/L   AST 38 (H) 0 - 37 U/L   ALT 19 0 - 53 U/L   Total Protein 5.9 (L) 6.0 - 8.3 g/dL   Albumin 3.1 (L) 3.5 - 5.2 g/dL   GFR 76.27 >60.00 mL/min   Calcium 8.2 (L) 8.4 - 10.5 mg/dL  CBC with Differential/Platelet     Status: Abnormal   Collection Time: 10/22/19 10:07 AM  Result Value Ref Range   WBC 5.6 4.0 - 10.5 K/uL   RBC  4.39 4.22 - 5.81 Mil/uL   Hemoglobin 13.1 13.0 - 17.0 g/dL   HCT 38.5 (L) 39.0 - 52.0 %   MCV 87.6 78.0 - 100.0 fl   MCHC 34.1 30.0 - 36.0 g/dL   RDW 15.2 11.5 - 15.5 %   Platelets 208.0 150.0 - 400.0 K/uL   Neutrophils Relative % 58.0 43.0 - 77.0 %   Lymphocytes Relative 18.5 12.0 - 46.0 %   Monocytes Relative 12.8 (H) 3.0 - 12.0 %   Eosinophils Relative 9.6 (H) 0.0 - 5.0 %   Basophils Relative 1.1 0.0 - 3.0 %   Neutro Abs 3.3 1.4 - 7.7 K/uL   Lymphs Abs 1.0 0.7 - 4.0 K/uL   Monocytes Absolute 0.7 0.1 - 1.0 K/uL   Eosinophils Absolute 0.5 0.0 - 0.7 K/uL   Basophils Absolute 0.1 0.0 - 0.1 K/uL    Assessment/Plan: 1. Gastroesophageal reflux disease without esophagitis Start GERD diet.  Handout given.  Rx Protonix 40 mg to use as directed over the next 2 weeks.  Follow-up in 2 weeks for reassessment.  Sooner for any new or worsening symptoms.  2. Nausea Suspect secondary to a combination of #1 and use of opioid pain medicine.  Patient has stopped pain medication with already some noted improvement in symptoms.  Start Protonix as noted above for GERD.  Dietary recommendations reviewed.  Follow-up if nausea is not resolving.  Otherwise we will see in 2 weeks for follow-up of GERD symptoms.  This visit occurred during the SARS-CoV-2 public health emergency.  Safety protocols were in place, including screening questions prior to the visit, additional usage of staff PPE, and extensive cleaning of exam room while observing appropriate contact time as indicated for disinfecting solutions.     Leeanne Rio, PA-C

## 2019-10-26 NOTE — Patient Instructions (Signed)
Please keep hydrated and get plenty of rest.  Take the Pantoprazole once daily as directed. Follow-up dietary measures below. Stay off of the pain medication.  You can stop the other OTC medicine for nausea. I have sent in script for Zofran that you can take as directed if needed for the next couple of days while the Protonix gets in your system.   Follow-up with me via phone mid next week to let me know how things are going.  If not improving we will refer to Gastroenterology for further assessment.    Gastroesophageal Reflux Disease, Adult Gastroesophageal reflux (GER) happens when acid from the stomach flows up into the tube that connects the mouth and the stomach (esophagus). Normally, food travels down the esophagus and stays in the stomach to be digested. With GER, food and stomach acid sometimes move back up into the esophagus. You may have a disease called gastroesophageal reflux disease (GERD) if the reflux:  Happens often.  Causes frequent or very bad symptoms.  Causes problems such as damage to the esophagus. When this happens, the esophagus becomes sore and swollen (inflamed). Over time, GERD can make small holes (ulcers) in the lining of the esophagus. What are the causes? This condition is caused by a problem with the muscle between the esophagus and the stomach. When this muscle is weak or not normal, it does not close properly to keep food and acid from coming back up from the stomach. The muscle can be weak because of:  Tobacco use.  Pregnancy.  Having a certain type of hernia (hiatal hernia).  Alcohol use.  Certain foods and drinks, such as coffee, chocolate, onions, and peppermint. What increases the risk? You are more likely to develop this condition if you:  Are overweight.  Have a disease that affects your connective tissue.  Use NSAID medicines. What are the signs or symptoms? Symptoms of this condition include:  Heartburn.  Difficult or painful  swallowing.  The feeling of having a lump in the throat.  A bitter taste in the mouth.  Bad breath.  Having a lot of saliva.  Having an upset or bloated stomach.  Belching.  Chest pain. Different conditions can cause chest pain. Make sure you see your doctor if you have chest pain.  Shortness of breath or noisy breathing (wheezing).  Ongoing (chronic) cough or a cough at night.  Wearing away of the surface of teeth (tooth enamel).  Weight loss. How is this treated? Treatment will depend on how bad your symptoms are. Your doctor may suggest:  Changes to your diet.  Medicine.  Surgery. Follow these instructions at home: Eating and drinking   Follow a diet as told by your doctor. You may need to avoid foods and drinks such as: ? Coffee and tea (with or without caffeine). ? Drinks that contain alcohol. ? Energy drinks and sports drinks. ? Bubbly (carbonated) drinks or sodas. ? Chocolate and cocoa. ? Peppermint and mint flavorings. ? Garlic and onions. ? Horseradish. ? Spicy and acidic foods. These include peppers, chili powder, curry powder, vinegar, hot sauces, and BBQ sauce. ? Citrus fruit juices and citrus fruits, such as oranges, lemons, and limes. ? Tomato-based foods. These include red sauce, chili, salsa, and pizza with red sauce. ? Fried and fatty foods. These include donuts, french fries, potato chips, and high-fat dressings. ? High-fat meats. These include hot dogs, rib eye steak, sausage, ham, and bacon. ? High-fat dairy items, such as whole milk, butter, and cream  cheese.  Eat small meals often. Avoid eating large meals.  Avoid drinking large amounts of liquid with your meals.  Avoid eating meals during the 2-3 hours before bedtime.  Avoid lying down right after you eat.  Do not exercise right after you eat. Lifestyle   Do not use any products that contain nicotine or tobacco. These include cigarettes, e-cigarettes, and chewing tobacco. If you  need help quitting, ask your doctor.  Try to lower your stress. If you need help doing this, ask your doctor.  If you are overweight, lose an amount of weight that is healthy for you. Ask your doctor about a safe weight loss goal. General instructions  Pay attention to any changes in your symptoms.  Take over-the-counter and prescription medicines only as told by your doctor. Do not take aspirin, ibuprofen, or other NSAIDs unless your doctor says it is okay.  Wear loose clothes. Do not wear anything tight around your waist.  Raise (elevate) the head of your bed about 6 inches (15 cm).  Avoid bending over if this makes your symptoms worse.  Keep all follow-up visits as told by your doctor. This is important. Contact a doctor if:  You have new symptoms.  You lose weight and you do not know why.  You have trouble swallowing or it hurts to swallow.  You have wheezing or a cough that keeps happening.  Your symptoms do not get better with treatment.  You have a hoarse voice. Get help right away if:  You have pain in your arms, neck, jaw, teeth, or back.  You feel sweaty, dizzy, or light-headed.  You have chest pain or shortness of breath.  You throw up (vomit) and your throw-up looks like blood or coffee grounds.  You pass out (faint).  Your poop (stool) is bloody or black.  You cannot swallow, drink, or eat. Summary  If a person has gastroesophageal reflux disease (GERD), food and stomach acid move back up into the esophagus and cause symptoms or problems such as damage to the esophagus.  Treatment will depend on how bad your symptoms are.  Follow a diet as told by your doctor.  Take all medicines only as told by your doctor. This information is not intended to replace advice given to you by your health care provider. Make sure you discuss any questions you have with your health care provider. Document Revised: 01/04/2018 Document Reviewed: 01/04/2018 Elsevier  Patient Education  [employer].

## 2019-10-29 ENCOUNTER — Telehealth: Payer: Self-pay

## 2019-10-29 NOTE — Telephone Encounter (Signed)
Pt was seen in the ER and recommended to follow up with [place]. Can we accept him as a new patient?

## 2019-10-30 ENCOUNTER — Encounter: Payer: Self-pay | Admitting: Physician Assistant

## 2019-10-30 ENCOUNTER — Encounter: Payer: Self-pay | Admitting: Internal Medicine

## 2019-10-30 DIAGNOSIS — K219 Gastro-esophageal reflux disease without esophagitis: Secondary | ICD-10-CM

## 2019-10-30 DIAGNOSIS — R110 Nausea: Secondary | ICD-10-CM

## 2019-10-30 NOTE — Telephone Encounter (Signed)
Ok to schedule new pt ov.  

## 2019-10-31 NOTE — Telephone Encounter (Signed)
Pt has a New Patient OV with [facility]

## 2019-11-03 MED FILL — PHENAZOPYRIDINE 200 MG TAB: 200 | 3 days supply | Qty: 10 | Fill #1

## 2019-11-03 MED FILL — TAMSULOSIN HCL 0.4 MG CAP: 0.4 | 90 days supply | Qty: 90 | Fill #1

## 2019-11-03 MED FILL — METOPROLOL TARTRATE 25 MG T: 25 | 90 days supply | Qty: 180 | Fill #1

## 2019-11-03 MED FILL — LEVOTHYROXINE 50 MCG TABLET: 50 | 30 days supply | Qty: 30 | Fill #1

## 2019-11-27 ENCOUNTER — Other Ambulatory Visit: Payer: Self-pay

## 2019-11-27 ENCOUNTER — Ambulatory Visit (INDEPENDENT_AMBULATORY_CARE_PROVIDER_SITE_OTHER): Payer: 59 | Admitting: Internal Medicine

## 2019-11-27 ENCOUNTER — Encounter: Payer: Self-pay | Admitting: Internal Medicine

## 2019-11-27 VITALS — BP 160/80 | HR 68 | Ht 71.0 in | Wt 202.6 lb

## 2019-11-27 DIAGNOSIS — Z8719 Personal history of other diseases of the digestive system: Secondary | ICD-10-CM | POA: Diagnosis not present

## 2019-11-27 DIAGNOSIS — R933 Abnormal findings on diagnostic imaging of other parts of digestive tract: Secondary | ICD-10-CM

## 2019-11-27 DIAGNOSIS — R194 Change in bowel habit: Secondary | ICD-10-CM

## 2019-11-27 NOTE — Patient Instructions (Signed)
You have been scheduled for a CT scan of the abdomen and pelvis at [facility].   You are scheduled on Wednesday 12/12/19 at 5:00 pm. You should arrive 15 minutes prior to your appointment time for registration. Please follow the written instructions below on the day of your exam:  WARNING: IF YOU ARE ALLERGIC TO IODINE/X-RAY DYE, PLEASE NOTIFY RADIOLOGY IMMEDIATELY AT [phone]! YOU WILL BE GIVEN A 13 HOUR PREMEDICATION PREP.  1) Do not eat or drink anything after 1:00 pm (4 hours prior to your test) 2) You have been given 2 bottles of oral contrast to drink. The solution may taste better if refrigerated, but do NOT add ice or any other liquid to this solution. Shake well before drinking.    Drink 1 bottle of contrast @ 3:00 pm (2 hours prior to your exam)  Drink 1 bottle of contrast @ 4:00 pm (1 hour prior to your exam)  You may take any medications as prescribed with a small amount of water, if necessary. If you take any of the following medications: METFORMIN, GLUCOPHAGE, GLUCOVANCE, AVANDAMET, RIOMET, FORTAMET, [place] MET, JANUMET, GLUMETZA or METAGLIP, you MAY be asked to HOLD this medication 48 hours AFTER the exam.  The purpose of you drinking the oral contrast is to aid in the visualization of your intestinal tract. The contrast solution may cause some diarrhea. Depending on your individual set of symptoms, you may also receive an intravenous injection of x-ray contrast/dye. Plan on being at [facility] for 30 minutes or longer, depending on the type of exam you are having performed.  This test typically takes 30-45 minutes to complete.  If you have any questions regarding your exam or if you need to reschedule, you may call the CT department at [phone] between the hours of 8:00 am and 5:00 pm, Monday-Friday.  ________________________________________________________________  It has been recommended to you by your physician that you have a(n) colonoscopy completed. Per  your request, we did not schedule the procedure(s) today. Please contact our office at [phone] should you decide to have the procedure completed. You will be scheduled for a pre-visit and procedure at that time. ________________________________________________________________ If you are age 68 or older, your body mass index should be between 23-30. Your Body mass index is 28.26 kg/m. If this is out of the aforementioned range listed, please consider follow up with your Primary Care Provider.  If you are age 88 or younger, your body mass index should be between 19-25. Your Body mass index is 28.26 kg/m. If this is out of the aformentioned range listed, please consider follow up with your Primary Care Provider.  _______________________________________________________________ Due to recent changes in healthcare laws, you may see the results of your imaging and laboratory studies on MyChart before your provider has had a chance to review them.  We understand that in some cases there may be results that are confusing or concerning to you. Not all laboratory results come back in the same time frame and the provider may be waiting for multiple results in order to interpret others.  Please give [place] 48 hours in order for your provider to thoroughly review all the results before contacting the office for clarification of your results.

## 2019-11-27 NOTE — Progress Notes (Signed)
Patient ID: Keith [name]., male   DOB: [birth year], 79 y.o.   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  He is here alone today.  He has a GI history with prior screening colonoscopy in [place] more than 10 years ago.  He recalls this being "normal".  In December 2020 he had decompression and lumbar fusion with Dr. Ronnald Ramp, he was recovering well from this surgery though was needing occasional oxycodone.  Then in March he developed severe flank pain and was diagnosed with a kidney stone causing obstruction.  This required a cystoscopy with stent placement.  A CT scan was performed on 09/28/2019 showing the moderate right-sided hydroureteronephrosis but also sigmoid colonic wall thickening and pericolonic edema felt related to diverticulitis.  It showed fatty liver, a tiny hiatal hernia and prostatomegaly.  He was treated with antibiotics at that time.  He does report that he was having abdominal pain on the right and left but he was unable to distinguish if this was related to the colon or his kidney stone.  He was treated with antibiotics.  He has noticed that his bowel habits have been somewhat different over the last few months having previously had one solid stool per day like clockwork each morning.  Subsequently now he is having 3-4 bowel movements a day which are semiloose.  No blood in his stool or melena.  He has had now having no abdominal pain.  His appetite is good.  No early satiety.  He was previously having nausea but also change in his taste.  He was started on pantoprazole by primary care and the nausea has resolved.  His taste has not yet returned to normal.  He denies dysphagia and  odynophagia.  He is no longer requiring narcotic pain medications.  He has also remained off of his Eliquis since his surgery and he does not plan to resume it.  He works in [employer].  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  He is married and his wife works locally as a Software engineer.  No tobacco use.  No alcohol use.  No family history of colon cancer.  Past Medical History:  Diagnosis Date  . Arthritis   . Atrial fibrillation ([place])    Cardioversion 2016  . Atrial flutter ([place])    Cardioversion 2013  . 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 Dysrhythmia   . GERD (gastroesophageal reflux disease)   . History of aortic valve replacement with bioprosthetic valve    Aortic regurgitation  . History of chicken pox   . History of kidney stones   . History of skin cancer   . History of stroke 2011  . Seasonal allergies   . Stroke [facility])     Past Surgical History:  Procedure Laterality Date  . AORTIC VALVE REPLACEMENT  2009   Bioprosthetic AVR and root replacement  . BACK SURGERY    . CYSTOSCOPY WITH RETROGRADE PYELOGRAM, URETEROSCOPY AND STENT PLACEMENT Bilateral 10/01/2019   Procedure: CYSTOSCOPY WITH RETROGRADE PYELOGRAM, URETEROSCOPY AND STENT PLACEMENT;  Surgeon: Irine Seal, MD;  Location: [facility];  Service: Urology;  Laterality: Bilateral;  .  EPIGASTRIC HERNIA REPAIR    . HERNIA REPAIR    . LUMBAR LAMINECTOMY/DECOMPRESSION MICRODISCECTOMY Right 06/01/2017   Procedure: EXTRAFORAMINAL MICRODISCECTOMY LUMBAR FIVE- SACRAL ONE, RIGHT;  Surgeon: Eustace Moore, MD;  Location: [place];  Service: Neurosurgery;  Laterality: Right;  . LUMBAR LAMINECTOMY/DECOMPRESSION MICRODISCECTOMY Bilateral 09/04/2018   Procedure: Laminectomy and Foraminotomy - Lumbar two-three bilateral;  Surgeon: Eustace Moore, MD;  Location: [place];  Service: Neurosurgery;  Laterality: Bilateral;  .  TRANSFORAMINAL LUMBAR INTERBODY FUSION (TLIF) WITH PEDICLE SCREW FIXATION 1 LEVEL Left 06/15/2019   Procedure: Decompression and Instrumention Fusion Lumbar Four- Five;  Surgeon: Eustace Moore, MD;  Location: [place];  Service: Neurosurgery;  Laterality: Left;  Decompression and Instrumention Fusion Lumbar Four- Five    Outpatient Medications Prior to Visit  Medication Sig Dispense Refill  . aluminum hydroxide-magnesium carbonate (GAVISCON) 95-358 mg/15 mL SUSP Take 30 mLs by mouth 3 (three) times daily as needed.    . Cholecalciferol (DIALYVITE VITAMIN D 5000) 125 MCG (5000 UT) capsule Take 5,000 Units by mouth every evening.    . diphenhydrAMINE (BENADRYL) 25 MG tablet Take 25 mg by mouth daily as needed for allergies.    Donnie Aho (GLUCOSAMINE MSM COMPLEX) TABS Take 1 tablet by mouth at bedtime.     Marland Kitchen levothyroxine (SYNTHROID) 50 MCG tablet Take 1 tablet (50 mcg total) by mouth daily before breakfast. Please schedule a physical with your primary care physician 30 tablet 1  . meclizine (ANTIVERT) 25 MG tablet Take 1 tablet by mouth every other day.    . methocarbamol (ROBAXIN) 500 MG tablet Take 500 mg by mouth every 6 (six) hours as needed for muscle spasms.     . metoprolol tartrate (LOPRESSOR) 25 MG tablet Take 25 mg by mouth daily.     Marland Kitchen oxyCODONE-acetaminophen (PERCOCET/ROXICET) 5-325 MG tablet Take 1 tablet by mouth every 4 (four) hours as needed. 12 tablet 0  . pantoprazole (PROTONIX) 40 MG tablet Take 1 tablet (40 mg total) by mouth daily. 30 tablet 3  . promethazine (PHENERGAN) 25 MG tablet Take 1 tablet by mouth 3 (three) times daily as needed.    . tamsulosin (FLOMAX) 0.4 MG CAPS capsule Take 1 capsule (0.4 mg total) by mouth at bedtime. 90 capsule 1  . Vitamin A 2400 MCG (8000 UT) CAPS Take 2,400 mcg by mouth every evening.    . vitamin B-12 (CYANOCOBALAMIN) 1000 MCG tablet Take 1 tablet (1,000 mcg total) by mouth daily. 90 tablet 0  . zinc sulfate 220 (50 Zn) MG  capsule Take 220 mg by mouth every evening.    Marland Kitchen ELIQUIS 5 MG TABS tablet Take 1 tablet by mouth daily.     No facility-administered medications prior to visit.    Allergies  Allergen Reactions  . Tape     No family history on file.  Social History   Tobacco Use  . Smoking status: Never Smoker  . Smokeless tobacco: Never Used  Substance Use Topics  . Alcohol use: Yes    Comment: occasional  . Drug use: No    ROS: As per history of present illness, otherwise negative  BP (!) 160/80   Pulse 68   Ht 5\' 11"  (1.803 m)   Wt 202 lb 9.6 oz (91.9 kg)   BMI 28.26 kg/m  Gen: awake, alert, NAD HEENT: anicteric, op clear CV: Ectopy with mechanical aortic valve Pulm: CTA b/l Abd: soft, NT/ND, +BS throughout Ext: no c/c/e Neuro: nonfocal   RELEVANT LABS AND  IMAGING: CBC    Component Value Date/Time   WBC 5.6 10/22/2019 1007   RBC 4.39 10/22/2019 1007   HGB 13.1 10/22/2019 1007   HCT 38.5 (L) 10/22/2019 1007   PLT 208.0 10/22/2019 1007   MCV 87.6 10/22/2019 1007   MCH 29.1 09/28/2019 0821   MCHC 34.1 10/22/2019 1007   RDW 15.2 10/22/2019 1007   LYMPHSABS 1.0 10/22/2019 1007   MONOABS 0.7 10/22/2019 1007   EOSABS 0.5 10/22/2019 1007   BASOSABS 0.1 10/22/2019 1007    CMP     Component Value Date/Time   NA 141 10/22/2019 1007   K 3.5 10/22/2019 1007   CL 106 10/22/2019 1007   CO2 25 10/22/2019 1007   GLUCOSE 123 (H) 10/22/2019 1007   BUN 15 10/22/2019 1007   CREATININE 0.95 10/22/2019 1007   CALCIUM 8.2 (L) 10/22/2019 1007   PROT 5.9 (L) 10/22/2019 1007   ALBUMIN 3.1 (L) 10/22/2019 1007   AST 38 (H) 10/22/2019 1007   ALT 19 10/22/2019 1007   ALKPHOS 62 10/22/2019 1007   BILITOT 0.8 10/22/2019 1007   GFRNONAA >60 09/28/2019 0821   GFRAA >60 09/28/2019 0821   CT ABDOMEN AND PELVIS WITHOUT CONTRAST   TECHNIQUE: Multidetector CT imaging of the abdomen and pelvis was performed following the standard protocol without IV contrast.   COMPARISON:  None.    FINDINGS: Lower chest: Clear lung bases. Mild cardiomegaly, without pericardial or pleural effusion. Tiny hiatal hernia. Fluid within the distal esophagus on 02/02.   Hepatobiliary: Moderate hepatic steatosis. Normal gallbladder, without biliary ductal dilatation.   Pancreas: Normal, without mass or ductal dilatation.   Spleen: Normal in size, without focal abnormality.   Adrenals/Urinary Tract: Normal adrenal glands. Multiple bilateral renal collecting system calculi. Mild renal cortical thinning bilaterally. Moderate right-sided hydroureteronephrosis to the level of an 8 x 13 mm stone or conglomeration of stones on transverse image 40/2 and coronal image 55/6. No bladder calculi. No air within the bladder.   Although there is no significant left hydroureter, stone or stones within the mid to distal left ureter measures 6 mm on 59/2 and 66/6 coronal.   Stomach/Bowel: Normal remainder of the stomach.   Extensive colonic diverticulosis. Marked thickening of and mild edema surrounding the sigmoid, including on images 63-66 of series 2. No well-defined pericolonic fluid collection and no evidence of free perforation. Normal terminal ileum and appendix.   Normal small bowel.   Vascular/Lymphatic: Aortic atherosclerosis. Nonspecific increased density in small bowel mesenteric fat with small nodes within.   Reproductive: Moderate prostatomegaly.   Other: No significant free fluid. Small bilateral fat containing inguinal hernias.   Musculoskeletal: L4-5 trans pedicle screw fixation. Advanced lumbosacral spondylosis.   IMPRESSION: 1. Moderate right-sided hydroureteronephrosis to the level of a 13 mm stone or conglomeration of stones in the mid to distal right ureter. 2. Left mid to distal ureteric calculus, without significant left-sided hydroureter. 3. Sigmoid colonic wall thickening and pericolonic edema. Most likely related to diverticulitis. Given the extent of  wall thickening, underlying neoplasm cannot be excluded. Consider colonoscopy follow-up. 4. Hepatic steatosis. 5. Tiny hiatal hernia. Esophageal air fluid level suggests dysmotility or gastroesophageal reflux. 6. Prostatomegaly. 7.  Aortic Atherosclerosis (ICD10-I70.0).   Electronically Signed: By: Abigail Miyamoto M.D. 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  1.  Recent diverticulitis/abnormal CT scan of the colon/change in bowel habits --CT scan did suggest diverticulitis in the sigmoid colon in March 2021.  He was treated with antibiotics both for diverticulitis but also kidney stone/urinary obstruction.  His abdominal pain has resolved his bowel habits have not yet returned to normal.  I recommended that we repeat imaging to reevaluate the colon and if no complication found then proceed to diagnostic colonoscopy.  We discussed the need to exclude pathology in the sigmoid colon such as a polyp or mass.  We discussed the risk, benefits and alternatives to colonoscopy and he is agreeable and wishes to proceed --CT scan of the abdomen pelvis with contrast --Colonoscopy in the [facility] after CT scan has been reviewed  2.  A. fib/prior AVR --he has remained off of Eliquis.  He should discuss this with both primary care and cardiology.  If this medication is resumed it will need to be held 48 hours prior to colonoscopy.  3.  GERD/nausea --PPI started by primary care.  No reflux symptoms or alarm symptoms such as dysphagia.  Nausea has resolved.  This may have been secondary to narcotics needed around the time of his back surgery and kidney stone.  He is no longer needing narcotic pain  medication.      [id number], Luanna Cole, [place] [address] [place] [address] [place],   [id number]

## 2019-11-28 DIAGNOSIS — N200 Calculus of kidney: Secondary | ICD-10-CM | POA: Diagnosis not present

## 2019-12-07 ENCOUNTER — Other Ambulatory Visit: Payer: Self-pay

## 2019-12-07 ENCOUNTER — Ambulatory Visit (HOSPITAL_COMMUNITY)
Admission: RE | Admit: 2019-12-07 | Discharge: 2019-12-07 | Disposition: A | Discharge status: Home / Self Care | Payer: 59 | Source: Ambulatory Visit | Attending: Internal Medicine | Admitting: Internal Medicine

## 2019-12-07 DIAGNOSIS — R933 Abnormal findings on diagnostic imaging of other parts of digestive tract: Secondary | ICD-10-CM | POA: Diagnosis not present

## 2019-12-07 DIAGNOSIS — Z8719 Personal history of other diseases of the digestive system: Secondary | ICD-10-CM | POA: Insufficient documentation

## 2019-12-07 DIAGNOSIS — K5732 Diverticulitis of large intestine without perforation or abscess without bleeding: Secondary | ICD-10-CM | POA: Diagnosis not present

## 2019-12-07 DIAGNOSIS — N201 Calculus of ureter: Secondary | ICD-10-CM | POA: Diagnosis not present

## 2019-12-07 LAB — POCT I-STAT CREATININE: Creatinine, Ser: 1.1 mg/dL (ref 0.61–1.24)

## 2019-12-07 MED ORDER — IOHEXOL 300 MG/ML  SOLN
100.0000 mL | Freq: Once | INTRAMUSCULAR | Status: AC | PRN
  Administered 2019-12-07: 100 mL via INTRAVENOUS

## 2019-12-12 ENCOUNTER — Ambulatory Visit (HOSPITAL_COMMUNITY): Payer: 59

## 2019-12-12 DIAGNOSIS — H2511 Age-related nuclear cataract, right eye: Secondary | ICD-10-CM | POA: Diagnosis not present

## 2019-12-12 NOTE — Progress Notes (Signed)
Spoke with patient regarding CT scan results. Propofol colonoscopy scheduled in [place] for 01/07/20 at 3:00 pm, pre visit scheduled for 12/27/19 at 4 pm. Pt states that he hasn't been on Eliquis in months. Pt also states that he received his second vaccine a couple of months ago. Pt has no other questions at this time, pt given office number to call back with any other questions or concerns.

## 2019-12-14 ENCOUNTER — Telehealth: Payer: Self-pay

## 2019-12-14 NOTE — Telephone Encounter (Signed)
Spoke with patient, pt requested to reschedule colon due to having another procedure that same day. Rescheduled propofol colon on 01/25/20 at 3:30 pm, pre-visit scheduled for 01/17/20 at 2:30 pm.

## 2019-12-19 ENCOUNTER — Other Ambulatory Visit: Payer: Self-pay | Admitting: Physician Assistant

## 2019-12-25 ENCOUNTER — Encounter: Payer: 59 | Admitting: Internal Medicine

## 2019-12-28 MED FILL — GATIFLOXACIN 0.5% EYE DROPS: 0.5 | 15 days supply | Qty: 3 | Fill #0

## 2019-12-28 MED FILL — PREDNISOLONE AC 1% EYE DROP: 1 | 15 days supply | Qty: 10 | Fill #0

## 2020-01-02 DIAGNOSIS — H2512 Age-related nuclear cataract, left eye: Secondary | ICD-10-CM | POA: Diagnosis not present

## 2020-01-02 DIAGNOSIS — H25812 Combined forms of age-related cataract, left eye: Secondary | ICD-10-CM | POA: Diagnosis not present

## 2020-01-07 ENCOUNTER — Encounter: Payer: 59 | Admitting: Internal Medicine

## 2020-01-10 ENCOUNTER — Telehealth: Payer: Self-pay | Admitting: *Deleted

## 2020-01-10 NOTE — Telephone Encounter (Signed)
Spoke with pt- verified he has not restarted Eliquis

## 2020-01-17 ENCOUNTER — Ambulatory Visit (AMBULATORY_SURGERY_CENTER): Payer: Self-pay | Admitting: *Deleted

## 2020-01-17 ENCOUNTER — Other Ambulatory Visit: Payer: Self-pay

## 2020-01-17 VITALS — Ht 71.0 in | Wt 195.6 lb

## 2020-01-17 DIAGNOSIS — R9389 Abnormal findings on diagnostic imaging of other specified body structures: Secondary | ICD-10-CM

## 2020-01-17 DIAGNOSIS — Z8719 Personal history of other diseases of the digestive system: Secondary | ICD-10-CM

## 2020-01-17 MED ORDER — SUPREP BOWEL PREP KIT 17.5-3.13-1.6 GM/177ML PO SOLN: ORAL | 0 refills | Status: DC

## 2020-01-17 MED FILL — SUPREP BOWEL PREP KIT: 17.5-3.13-1 | 1 days supply | Qty: 354 | Fill #0

## 2020-01-17 NOTE — Progress Notes (Signed)
2nd dose of covid 09-20-19  Pt is aware that care partner will wait in the car during procedure; if they feel like they will be too hot or cold to wait in the car; they may wait in the 4 th floor lobby. Patient is aware to bring only one care partner. We want them to wear a mask (we do not have any that we can provide them), practice social distancing, and we will check their temperatures when they get here.  I did remind the patient that their care partner needs to stay in the parking lot the entire time and have a cell phone available, we will call them when the pt is ready for discharge. Patient will wear mask into building.   No trouble with anesthesia, difficulty with intubation or hx/fam hx of malignant hyperthermia per pt  Pt is not on Eliquis at this time   No egg or soy allergy  No home oxygen use   No medications for weight loss taken  Pt denies constipation issues

## 2020-01-18 MED FILL — LEVOTHYROXINE 50 MCG TABLET: 50 | 30 days supply | Qty: 30 | Fill #1

## 2020-01-24 ENCOUNTER — Encounter: Payer: Self-pay | Admitting: Certified Registered Nurse Anesthetist

## 2020-01-24 DIAGNOSIS — R030 Elevated blood-pressure reading, without diagnosis of hypertension: Secondary | ICD-10-CM | POA: Diagnosis not present

## 2020-01-24 DIAGNOSIS — Z6826 Body mass index (BMI) 26.0-26.9, adult: Secondary | ICD-10-CM | POA: Diagnosis not present

## 2020-01-24 DIAGNOSIS — M4316 Spondylolisthesis, lumbar region: Secondary | ICD-10-CM | POA: Diagnosis not present

## 2020-01-25 ENCOUNTER — Other Ambulatory Visit: Payer: Self-pay

## 2020-01-25 ENCOUNTER — Ambulatory Visit (AMBULATORY_SURGERY_CENTER): Payer: 59 | Admitting: Internal Medicine

## 2020-01-25 ENCOUNTER — Encounter: Payer: Self-pay | Admitting: Internal Medicine

## 2020-01-25 VITALS — BP 107/71 | HR 61 | Temp 97.1°F | Resp 21 | Ht 71.0 in | Wt 195.6 lb

## 2020-01-25 DIAGNOSIS — Z8719 Personal history of other diseases of the digestive system: Secondary | ICD-10-CM

## 2020-01-25 DIAGNOSIS — D120 Benign neoplasm of cecum: Secondary | ICD-10-CM | POA: Diagnosis not present

## 2020-01-25 DIAGNOSIS — R9389 Abnormal findings on diagnostic imaging of other specified body structures: Secondary | ICD-10-CM | POA: Diagnosis not present

## 2020-01-25 DIAGNOSIS — K5730 Diverticulosis of large intestine without perforation or abscess without bleeding: Secondary | ICD-10-CM

## 2020-01-25 DIAGNOSIS — K649 Unspecified hemorrhoids: Secondary | ICD-10-CM

## 2020-01-25 MED ORDER — SODIUM CHLORIDE 0.9 % IV SOLN: 500.0000 mL | Freq: Once | INTRAVENOUS | Status: DC

## 2020-01-25 NOTE — Progress Notes (Signed)
Report given to PACU, vss 

## 2020-01-25 NOTE — Op Note (Signed)
[place] Patient Name: Keith [name] Procedure Date: 01/25/2020 3:31 PM MRN: [id number] Endoscopist: Jerene Bears , MD Age: 79 Referring MD:  Date of Birth: Dec 05, 1940 Gender: Male Account #: [id number] Procedure:                Colonoscopy Indications:              Abnormal CT of the GI tract March and May 2021 with                            sigmoid diverticulosis and thickening consistent                            with diverticulitis, last colonoscopy > 10 yrs ago                            in [place]:                Monitored Anesthesia Care Procedure:                Pre-Anesthesia Assessment:                           - Prior to the procedure, a History and Physical                            was performed, and patient medications and                            allergies were reviewed. The patient's tolerance of                            previous anesthesia was also reviewed. The risks                            and benefits of the procedure and the sedation                            options and risks were discussed with the patient.                            All questions were answered, and informed consent                            was obtained. Prior Anticoagulants: The patient has                            taken no previous anticoagulant or antiplatelet                            agents. ASA Grade Assessment: III - A patient with                            severe systemic disease. After reviewing the risks  and benefits, the patient was deemed in                            satisfactory condition to undergo the procedure.                           After obtaining informed consent, the colonoscope                            was passed under direct vision. Throughout the                            procedure, the patient's blood pressure, pulse, and                            oxygen saturations were monitored continuously. The                             Colonoscope was introduced through the anus and                            advanced to the cecum, identified by appendiceal                            orifice and ileocecal valve. The colonoscopy was                            performed without difficulty. The patient tolerated                            the procedure well. The quality of the bowel                            preparation was good. The ileocecal valve,                            appendiceal orifice, and rectum were photographed. Scope In: 3:36:41 PM Scope Out: 3:52:45 PM Scope Withdrawal Time: 0 hours 12 minutes 10 seconds  Total Procedure Duration: 0 hours 16 minutes 4 seconds  Findings:                 The digital rectal exam was normal.                           A 4 mm polyp was found in the cecum. The polyp was                            sessile. The polyp was removed with a cold snare.                            Resection and retrieval were complete.                           Multiple small and large-mouthed diverticula were  found in the sigmoid colon, descending colon,                            transverse colon and ascending colon. The                            diverticulosis is mild in the ascending colon,                            moderate in the transverse colon and severe in the                            descending and sigmoid colon. There is mild                            erythema in the sigmoid, but not evidence of                            significant colitis, polyps or masses.                           Internal hemorrhoids were found during                            retroflexion. The hemorrhoids were medium-sized. Complications:            No immediate complications. Estimated Blood Loss:     Estimated blood loss was minimal. Impression:               - One 4 mm polyp in the cecum, removed with a cold                            snare. Resected and  retrieved.                           - Diverticulosis in the sigmoid colon, in the                            descending colon, in the transverse colon and in                            the ascending colon.                           - Internal hemorrhoids. Recommendation:           - Patient has a contact number available for                            emergencies. The signs and symptoms of potential                            delayed complications were discussed with the  patient. Return to normal activities tomorrow.                            Written discharge instructions were provided to the                            patient.                           - Resume previous diet.                           - Continue present medications.                           - Await pathology results.                           - No recommendation at this time regarding repeat                            colonoscopy due to age at next                            screening/surveillance interval. Jerene Bears, MD 01/25/2020 3:58:49 PM This report has been signed electronically.

## 2020-01-25 NOTE — Progress Notes (Signed)
Pt's states no medical or surgical changes since previsit or office visit.  VS WR 

## 2020-01-25 NOTE — Patient Instructions (Signed)
Handouts given:  Hemorrhoids, Diverticulosis, Polyps Resume previous diet Continue present medications Await pathology results NO need to repeat colonoscopy due to age   79 HAD AN ENDOSCOPIC PROCEDURE TODAY AT [facility]:   Refer to the procedure report that was given to you for any specific questions about what was found during the examination.  If the procedure report does not answer your questions, please call your gastroenterologist to clarify.  If you requested that your care partner not be given the details of your procedure findings, then the procedure report has been included in a sealed envelope for you to review at your convenience later.  YOU SHOULD EXPECT: Some feelings of bloating in the abdomen. Passage of more gas than usual.  Walking can help get rid of the air that was put into your GI tract during the procedure and reduce the bloating. If you had a lower endoscopy (such as a colonoscopy or flexible sigmoidoscopy) you may notice spotting of blood in your stool or on the toilet paper. If you underwent a bowel prep for your procedure, you may not have a normal bowel movement for a few days.  Please Note:  You might notice some irritation and congestion in your nose or some drainage.  This is from the oxygen used during your procedure.  There is no need for concern and it should clear up in a day or so.  SYMPTOMS TO REPORT IMMEDIATELY:   Following lower endoscopy (colonoscopy or flexible sigmoidoscopy):  Excessive amounts of blood in the stool  Significant tenderness or worsening of abdominal pains  Swelling of the abdomen that is new, acute  Fever of 100F or higher  For urgent or emergent issues, a gastroenterologist can be reached at any hour by calling [phone]. Do not use MyChart messaging for urgent concerns.    DIET:  We do recommend a small meal at first, but then you may proceed to your regular diet.  Drink plenty of fluids but you should  avoid alcoholic beverages for 24 hours.  ACTIVITY:  You should plan to take it easy for the rest of today and you should NOT DRIVE or use heavy machinery until tomorrow (because of the sedation medicines used during the test).    FOLLOW UP: Our staff will call the number listed on your records 48-72 hours following your procedure to check on you and address any questions or concerns that you may have regarding the information given to you following your procedure. If we do not reach you, we will leave a message.  We will attempt to reach you two times.  During this call, we will ask if you have developed any symptoms of COVID 19. If you develop any symptoms (ie: fever, flu-like symptoms, shortness of breath, cough etc.) before then, please call [phone].  If you test positive for Covid 19 in the 2 weeks post procedure, please call and report this information to [place].    If any biopsies were taken you will be contacted by phone or by letter within the next 1-3 weeks.  Please call us at [phone] if you have not heard about the biopsies in 3 weeks.    SIGNATURES/CONFIDENTIALITY: You and/or your care partner have signed paperwork which will be entered into your electronic medical record.  These signatures attest to the fact that that the information above on your After Visit Summary has been reviewed and is understood.  Full responsibility of the confidentiality of this discharge information  lies with you and/or your care-partner.

## 2020-01-29 ENCOUNTER — Telehealth: Payer: Self-pay

## 2020-01-29 NOTE — Telephone Encounter (Signed)
  Follow up Call-  Call back number 01/25/2020  Post procedure Call Back phone  # [phone]  Permission to leave phone message Yes  Some recent data might be hidden     Patient questions:  Do you have a fever, pain , or abdominal swelling? No. Pain Score  0 *  Have you tolerated food without any problems? Yes.    Have you been able to return to your normal activities? Yes.    Do you have any questions about your discharge instructions: Diet   No. Medications  No. Follow up visit  No.  Do you have questions or concerns about your Care? No.  Actions: * If pain score is 4 or above: No action needed, pain <4.  1. Have you developed a fever since your procedure? No  2.   Have you had an respiratory symptoms (SOB or cough) since your procedure? No  3.   Have you tested positive for COVID 19 since your procedure No 4.   Have you had any family members/close contacts diagnosed with the COVID 19 since your procedure? No   If yes to any of these questions please route to Joylene John, RN and Erenest Rasher, RN

## 2020-02-03 ENCOUNTER — Encounter: Payer: Self-pay | Admitting: Internal Medicine

## 2020-02-12 ENCOUNTER — Other Ambulatory Visit: Payer: Self-pay | Admitting: Physician Assistant

## 2020-02-12 MED FILL — LEVOTHYROXINE 50 MCG TABLET: 50 | 30 days supply | Qty: 30 | Fill #1

## 2020-02-12 MED FILL — TAMSULOSIN HCL 0.4 MG CAP: 0.4 | 30 days supply | Qty: 30 | Fill #0

## 2020-02-22 MED FILL — LEVOTHYROXINE 50 MCG TABLET: 50 | 90 days supply | Qty: 90 | Fill #0

## 2020-02-26 ENCOUNTER — Ambulatory Visit (INDEPENDENT_AMBULATORY_CARE_PROVIDER_SITE_OTHER): Payer: 59

## 2020-02-26 VITALS — Ht 71.0 in | Wt 187.0 lb

## 2020-02-26 DIAGNOSIS — Z0000 Encounter for general adult medical examination without abnormal findings: Secondary | ICD-10-CM | POA: Diagnosis not present

## 2020-02-26 NOTE — Patient Instructions (Signed)
Mr. Keith [name] , Thank you for taking time to complete your Medicare Wellness Visit. I appreciate your ongoing commitment to your health goals. Please review the following plan we discussed and let me know if I can assist you in the future.   Screening recommendations/referrals: Colonoscopy: No longer indicated Recommended yearly ophthalmology/optometry visit for glaucoma screening and checkup Recommended yearly dental visit for hygiene and checkup  Vaccinations: Influenza vaccine: Due 03/2020 Pneumococcal vaccine: Completed vaccines Tdap vaccine: Discuss with pharmacy Shingles vaccine: Discuss with pharmacy   Covid-19: Completed vaccines  Advanced directives: Discussed. Information mailed today  Conditions/risks identified: See problem list  Next appointment: Follow up in one year for your annual wellness visit.   Preventive Care 1 Years and Older, Male Preventive care refers to lifestyle choices and visits with your health care provider that can promote health and wellness. What does preventive care include?  A yearly physical exam. This is also called an annual well check.  Dental exams once or twice a year.  Routine eye exams. Ask your health care provider how often you should have your eyes checked.  Personal lifestyle choices, including:  Daily care of your teeth and gums.  Regular physical activity.  Eating a healthy diet.  Avoiding tobacco and drug use.  Limiting alcohol use.  Practicing safe sex.  Taking low doses of aspirin every day.  Taking vitamin and mineral supplements as recommended by your health care provider. What happens during an annual well check? The services and screenings done by your health care provider during your annual well check will depend on your age, overall health, lifestyle risk factors, and family history of disease. Counseling  Your health care provider may ask you questions about your:  Alcohol use.  Tobacco use.  Drug  use.  Emotional well-being.  Home and relationship well-being.  Sexual activity.  Eating habits.  History of falls.  Memory and ability to understand (cognition).  Work and work Statistician. Screening  You may have the following tests or measurements:  Height, weight, and BMI.  Blood pressure.  Lipid and cholesterol levels. These may be checked every 5 years, or more frequently if you are over 65 years old.  Skin check.  Lung cancer screening. You may have this screening every year starting at age 84 if you have a 30-pack-year history of smoking and currently smoke or have quit within the past 15 years.  Fecal occult blood test (FOBT) of the stool. You may have this test every year starting at age 30.  Flexible sigmoidoscopy or colonoscopy. You may have a sigmoidoscopy every 5 years or a colonoscopy every 10 years starting at age 3.  Prostate cancer screening. Recommendations will vary depending on your family history and other risks.  Hepatitis C blood test.  Hepatitis B blood test.  Sexually transmitted disease (STD) testing.  Diabetes screening. This is done by checking your blood sugar (glucose) after you have not eaten for a while (fasting). You may have this done every 1-3 years.  Abdominal aortic aneurysm (AAA) screening. You may need this if you are a current or former smoker.  Osteoporosis. You may be screened starting at age 11 if you are at high risk. Talk with your health care provider about your test results, treatment options, and if necessary, the need for more tests. Vaccines  Your health care provider may recommend certain vaccines, such as:  Influenza vaccine. This is recommended every year.  Tetanus, diphtheria, and acellular pertussis (Tdap, Td) vaccine. You may  need a Td booster every 10 years.  Zoster vaccine. You may need this after age 23.  Pneumococcal 13-valent conjugate (PCV13) vaccine. One dose is recommended after age  10.  Pneumococcal polysaccharide (PPSV23) vaccine. One dose is recommended after age 44. Talk to your health care provider about which screenings and vaccines you need and how often you need them. This information is not intended to replace advice given to you by your health care provider. Make sure you discuss any questions you have with your health care provider. Document Released: 07/25/2015 Document Revised: 03/17/2016 Document Reviewed: 04/29/2015 Elsevier Interactive Patient Education  2017 [employer] Prevention in the Home Falls can cause injuries. They can happen to people of all ages. There are many things you can do to make your home safe and to help prevent falls. What can I do on the outside of my home?  Regularly fix the edges of walkways and driveways and fix any cracks.  Remove anything that might make you trip as you walk through a door, such as a raised step or threshold.  Trim any bushes or trees on the path to your home.  Use bright outdoor lighting.  Clear any walking paths of anything that might make someone trip, such as rocks or tools.  Regularly check to see if handrails are loose or broken. Make sure that both sides of any steps have handrails.  Any raised decks and porches should have guardrails on the edges.  Have any leaves, snow, or ice cleared regularly.  Use sand or salt on walking paths during winter.  Clean up any spills in your garage right away. This includes oil or grease spills. What can I do in the bathroom?  Use night lights.  Install grab bars by the toilet and in the tub and shower. Do not use towel bars as grab bars.  Use non-skid mats or decals in the tub or shower.  If you need to sit down in the shower, use a plastic, non-slip stool.  Keep the floor dry. Clean up any water that spills on the floor as soon as it happens.  Remove soap buildup in the tub or shower regularly.  Attach bath mats securely with double-sided  non-slip rug tape.  Do not have throw rugs and other things on the floor that can make you trip. What can I do in the bedroom?  Use night lights.  Make sure that you have a light by your bed that is easy to reach.  Do not use any sheets or blankets that are too big for your bed. They should not hang down onto the floor.  Have a firm chair that has side arms. You can use this for support while you get dressed.  Do not have throw rugs and other things on the floor that can make you trip. What can I do in the kitchen?  Clean up any spills right away.  Avoid walking on wet floors.  Keep items that you use a lot in easy-to-reach places.  If you need to reach something above you, use a strong step stool that has a grab bar.  Keep electrical cords out of the way.  Do not use floor polish or wax that makes floors slippery. If you must use wax, use non-skid floor wax.  Do not have throw rugs and other things on the floor that can make you trip. What can I do with my stairs?  Do not leave any items on  the stairs.  Make sure that there are handrails on both sides of the stairs and use them. Fix handrails that are broken or loose. Make sure that handrails are as long as the stairways.  Check any carpeting to make sure that it is firmly attached to the stairs. Fix any carpet that is loose or worn.  Avoid having throw rugs at the top or bottom of the stairs. If you do have throw rugs, attach them to the floor with carpet tape.  Make sure that you have a light switch at the top of the stairs and the bottom of the stairs. If you do not have them, ask someone to add them for you. What else can I do to help prevent falls?  Wear shoes that:  Do not have high heels.  Have rubber bottoms.  Are comfortable and fit you well.  Are closed at the toe. Do not wear sandals.  If you use a stepladder:  Make sure that it is fully opened. Do not climb a closed stepladder.  Make sure that both  sides of the stepladder are locked into place.  Ask someone to hold it for you, if possible.  Clearly mark and make sure that you can see:  Any grab bars or handrails.  First and last steps.  Where the edge of each step is.  Use tools that help you move around (mobility aids) if they are needed. These include:  Canes.  Walkers.  Scooters.  Crutches.  Turn on the lights when you go into a dark area. Replace any light bulbs as soon as they burn out.  Set up your furniture so you have a clear path. Avoid moving your furniture around.  If any of your floors are uneven, fix them.  If there are any pets around you, be aware of where they are.  Review your medicines with your doctor. Some medicines can make you feel dizzy. This can increase your chance of falling. Ask your doctor what other things that you can do to help prevent falls. This information is not intended to replace advice given to you by your health care provider. Make sure you discuss any questions you have with your health care provider. Document Released: 04/24/2009 Document Revised: 12/04/2015 Document Reviewed: 08/02/2014 Elsevier Interactive Patient Education  2017 [employer].

## 2020-02-26 NOTE — Progress Notes (Signed)
Subjective:   Keith [name]. is a 79 y.o. male who presents for an Initial Medicare Annual Wellness Visit.  I connected with Tafari today by telephone and verified that I am speaking with the correct person using two identifiers. Location patient: home Location provider: work Persons participating in the virtual visit: patient, Marine scientist.    I discussed the limitations, risks, security and privacy concerns of performing an evaluation and management service by telephone and the availability of in person appointments. I also discussed with the patient that there may be a patient responsible charge related to this service. The patient expressed understanding and verbally consented to this telephonic visit.    Interactive audio and video telecommunications were attempted between this provider and patient, however failed, due to patient having technical difficulties OR patient did not have access to video capability.  We continued and completed visit with audio only.  Some vital signs may be absent or patient reported.   Time Spent with patient on telephone encounter: 20 minutes  Review of Systems     Cardiac Risk Factors include: advanced age (>85men, >30 women);sedentary lifestyle;hypertension     Objective:    Today's Vitals   02/26/20 1018  Weight: 187 lb (84.8 kg)  Height: 5\' 11"  (1.803 m)   Body mass index is 26.08 kg/m.  Advanced Directives 02/26/2020 09/28/2019 06/12/2019 09/04/2018 08/25/2018 06/01/2017 05/31/2017  Does Patient Have a Medical Advance Directive? No No Yes No No Yes Yes  Type of Advance Directive - - Living will - - [name];[name];Living will  Does patient want to make changes to medical advance directive? - - No - Patient declined - - No - Patient declined No - Patient declined  Copy of [place] in Chart? - - - - - No - copy requested No - copy requested  Would patient like information on  creating a medical advance directive? Yes (MAU/Ambulatory/Procedural Areas - Information given) No - Patient declined - No - Patient declined No - Patient declined No - Patient declined No - Patient declined    Current Medications (verified) Outpatient Encounter Medications as of 02/26/2020  Medication Sig  . Cholecalciferol (DIALYVITE VITAMIN D 5000) 125 MCG (5000 UT) capsule Take 5,000 Units by mouth every evening.  . diphenhydrAMINE (BENADRYL) 25 MG tablet Take 25 mg by mouth daily as needed for allergies.  Donnie Aho (GLUCOSAMINE MSM COMPLEX) TABS Take 1 tablet by mouth at bedtime.   Marland Kitchen levothyroxine (SYNTHROID) 50 MCG tablet Take 1 tablet (50 mcg total) by mouth daily before breakfast.  . metoprolol tartrate (LOPRESSOR) 25 MG tablet Take 25 mg by mouth daily.   . tamsulosin (FLOMAX) 0.4 MG CAPS capsule Take 1 capsule (0.4 mg total) by mouth at bedtime.  . Vitamin A 2400 MCG (8000 UT) CAPS Take 2,400 mcg by mouth every evening.   . vitamin B-12 (CYANOCOBALAMIN) 1000 MCG tablet Take 1 tablet (1,000 mcg total) by mouth daily.  Marland Kitchen zinc sulfate 220 (50 Zn) MG capsule Take 220 mg by mouth every evening.   Marland Kitchen aluminum hydroxide-magnesium carbonate (GAVISCON) 95-358 mg/15 mL SUSP Take 30 mLs by mouth 3 (three) times daily as needed. (Patient not taking: Reported on 01/25/2020)  . ELIQUIS 5 MG TABS tablet Take 1 tablet by mouth daily. (Patient not taking: Reported on 01/17/2020)  . meclizine (ANTIVERT) 25 MG tablet Take 1 tablet by mouth every other day. (Patient not taking: Reported on 01/17/2020)  . methocarbamol (  ROBAXIN) 500 MG tablet Take 500 mg by mouth every 6 (six) hours as needed for muscle spasms.  (Patient not taking: Reported on 01/25/2020)  . oxyCODONE-acetaminophen (PERCOCET/ROXICET) 5-325 MG tablet Take 1 tablet by mouth every 4 (four) hours as needed. (Patient not taking: Reported on 01/25/2020)  . pantoprazole (PROTONIX) 40 MG tablet Take 1 tablet (40 mg total) by mouth  daily. (Patient not taking: Reported on 01/25/2020)  . promethazine (PHENERGAN) 25 MG tablet Take 1 tablet by mouth 3 (three) times daily as needed. (Patient not taking: Reported on 01/25/2020)   No facility-administered encounter medications on file as of 02/26/2020.    Allergies (verified) Tape   History: Past Medical History:  Diagnosis Date  . Arthritis   . Atrial fibrillation ([place])    Cardioversion 2016  . Atrial flutter ([place])    Cardioversion 2013  . Blood transfusion without reported diagnosis   . 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 Dysrhythmia   . GERD (gastroesophageal reflux disease)   . History of aortic valve replacement with bioprosthetic valve    Aortic regurgitation  . History of chicken pox   . History of kidney stones   . History of skin cancer   . History of stroke 2011  . Kidney stone   . Seasonal allergies   . Stroke [facility])    after heart surgery 2010   Past Surgical History:  Procedure Laterality Date  . AORTIC VALVE REPLACEMENT  2009   Bioprosthetic AVR and root replacement  . BACK SURGERY     total of 4 back surgeries  . CATARACT EXTRACTION, BILATERAL    . COLONOSCOPY    . CYSTOSCOPY WITH RETROGRADE PYELOGRAM, URETEROSCOPY AND STENT PLACEMENT Bilateral 10/01/2019   Procedure: CYSTOSCOPY WITH RETROGRADE PYELOGRAM, URETEROSCOPY AND STENT PLACEMENT;  Surgeon: Irine Seal, MD;  Location: [facility];  Service: Urology;  Laterality: Bilateral;  . EPIGASTRIC HERNIA REPAIR    . HERNIA REPAIR    . LUMBAR LAMINECTOMY/DECOMPRESSION MICRODISCECTOMY Right 06/01/2017   Procedure: EXTRAFORAMINAL MICRODISCECTOMY LUMBAR FIVE- SACRAL ONE, RIGHT;  Surgeon: Eustace Moore, MD;  Location: [facility];  Service: Neurosurgery;  Laterality: Right;  . LUMBAR LAMINECTOMY/DECOMPRESSION MICRODISCECTOMY Bilateral 09/04/2018   Procedure: Laminectomy and Foraminotomy - Lumbar two-three bilateral;  Surgeon: Eustace Moore, MD;  Location: [facility];  Service: Neurosurgery;  Laterality:  Bilateral;  . TRANSFORAMINAL LUMBAR INTERBODY FUSION (TLIF) WITH PEDICLE SCREW FIXATION 1 LEVEL Left 06/15/2019   Procedure: Decompression and Instrumention Fusion Lumbar Four- Five;  Surgeon: Eustace Moore, MD;  Location: [facility];  Service: Neurosurgery;  Laterality: Left;  Decompression and Instrumention Fusion Lumbar Four- Five   Family History  Problem Relation Age of Onset  . Colon cancer Neg Hx   . Esophageal cancer Neg Hx   . Rectal cancer Neg Hx   . Stomach cancer Neg Hx    Social History   Socioeconomic History  . Marital status: Married    Spouse name: Arbie Cookey  . Number of children: 1  . Years of education: Not on file  . Highest education level: Not on file  Occupational History  . Occupation: Chief Strategy Officer  Tobacco Use  . Smoking status: Never Smoker  . Smokeless tobacco: Never Used  Vaping Use  . Vaping Use: Never used  Substance and Sexual Activity  . Alcohol use: Yes    Comment: occasional  . Drug use: No  . Sexual activity: Yes  Other Topics Concern  . Not on file  Social History  Narrative  . Not on file   Social Determinants of Health   Financial Resource Strain: Low Risk   . Difficulty of Paying Living Expenses: Not hard at all  Food Insecurity: No Food Insecurity  . Worried About Charity fundraiser in the Last Year: Never true  . Ran Out of Food in the Last Year: Never true  Transportation Needs: No Transportation Needs  . Lack of Transportation (Medical): No  . Lack of Transportation (Non-Medical): No  Physical Activity: Inactive  . Days of Exercise per Week: 0 days  . Minutes of Exercise per Session: 0 min  Stress: No Stress Concern Present  . Feeling of Stress : Not at all  Social Connections: Moderately Integrated  . Frequency of Communication with Friends and Family: More than three times a week  . Frequency of Social Gatherings with Friends and Family: Once a week  . Attends Religious Services: Never  . Active Member of Clubs or Organizations:  Yes  . Attends Archivist Meetings: More than 4 times per year  . Marital Status: Married    Tobacco Counseling Counseling given: Not Answered   Clinical Intake:  Pre-visit preparation completed: Yes  Pain : No/denies pain     Nutritional Status: BMI 25 -29 Overweight Nutritional Risks: None Diabetes: No  How often do you need to have someone help you when you read instructions, pamphlets, or other written materials from your doctor or pharmacy?: 1 - Never What is the last grade level you completed in school?: Junior college  Diabetic?No  Interpreter Needed?: No  Information entered by :: Caroleen Hamman LPN   Activities of Daily Living In your present state of health, do you have any difficulty performing the following activities: 02/26/2020 08/14/2019  Hearing? N N  Vision? N N  Difficulty concentrating or making decisions? N N  Walking or climbing stairs? N N  Dressing or bathing? N N  Doing errands, shopping? N N  Preparing Food and eating ? N -  Using the Toilet? N -  In the past six months, have you accidently leaked urine? N -  Do you have problems with loss of bowel control? N -  Managing your Medications? N -  Managing your Finances? N -  Housekeeping or managing your Housekeeping? N -  Some recent data might be hidden    Patient Care Team: Delorse Limber as PCP - General (Family Medicine)  Indicate any recent Medical Services you may have received from other than [facility] providers in the past year (date may be approximate).     Assessment:   This is a routine wellness examination for Daiel.  Hearing/Vision screen  Hearing Screening   125Hz  250Hz  500Hz  1000Hz  2000Hz  3000Hz  4000Hz  6000Hz  8000Hz   Right ear:           Left ear:           Comments: No issues  Vision Screening Comments: Last eye exam-12/2019 Dr. York Spaniel Catartacts removed   Dietary issues and exercise activities discussed: Current Exercise Habits: The patient does  not participate in regular exercise at present, Exercise limited by: None identified  Goals    . Patient Stated     Increase physical activity & eat healthier      Depression Screen PHQ 2/9 Scores 02/26/2020 08/14/2019 08/10/2017  PHQ - 2 Score 0 0 0    Fall Risk Fall Risk  02/26/2020 08/14/2019 08/10/2017  Falls in the past year? 0 0 No  Number falls in past yr: 0 0 -  Injury with Fall? 0 0 -  Follow up Falls prevention discussed Falls evaluation completed -    Any stairs in or around the home? Yes  If so, are there any without handrails? No  Home free of loose throw rugs in walkways, pet beds, electrical cords, etc? Yes  Adequate lighting in your home to reduce risk of falls? Yes   ASSISTIVE DEVICES UTILIZED TO PREVENT FALLS:  Life alert? No  Use of a cane, walker or w/c? No  Grab bars in the bathroom? No  Shower chair or bench in shower? No  Elevated toilet seat or a handicapped toilet? No   TIMED UP AND GO:  Was the test performed? No . Phone visit   Cognitive Function:NO cognitive impairment noted.        Immunizations Immunization History  Administered Date(s) Administered  . Influenza Split 04/11/2013, 06/30/2016, 05/10/2017  . Influenza, High Dose Seasonal PF 06/06/2018  . Influenza,inj,Quad PF,6+ Mos 06/13/2019  . Moderna SARS-COVID-2 Vaccination 08/23/2019, 09/20/2019  . Pneumococcal Conjugate-13 09/09/2017  . Pneumococcal Polysaccharide-23 08/14/2019    TDAP status: Due, Education has been provided regarding the importance of this vaccine. Advised may receive this vaccine at local pharmacy or Health Dept. Aware to provide a copy of the vaccination record if obtained from local pharmacy or Health Dept. Verbalized acceptance and understanding.   Flu Vaccine status: Up to date Due 03/2020  Pneumococcal vaccine status: Up to date   Covid-19 vaccine status: Completed vaccines  Qualifies for Shingles Vaccine? Yes   Zostavax completed No   Shingrix  Completed?: No.    Education has been provided regarding the importance of this vaccine. Patient has been advised to call insurance company to determine out of pocket expense if they have not yet received this vaccine. Advised may also receive vaccine at local pharmacy or Health Dept. Verbalized acceptance and understanding.  Screening Tests Health Maintenance  Topic Date Due  . DTAP VACCINES (1) Never done  . DTaP/Tdap/Td (1 - Tdap) Never done  . INFLUENZA VACCINE  02/10/2020  . TETANUS/TDAP  08/13/2020 (Originally 09/25/1959)  . COVID-19 Vaccine  Completed  . PNA vac Low Risk Adult  Completed    Health Maintenance  Health Maintenance Due  Topic Date Due  . DTAP VACCINES (1) Never done  . DTaP/Tdap/Td (1 - Tdap) Never done  . INFLUENZA VACCINE  02/10/2020    Colorectal cancer screening: No longer required.   Lung Cancer Screening: (Low Dose CT Chest recommended if Age 35-80 years, 30 pack-year currently smoking OR have quit w/in 15years.) does not qualify.    Additional Screening:  Hepatitis C Screening: does not qualify;   Vision Screening: Recommended annual ophthalmology exams for early detection of glaucoma and other disorders of the eye. Is the patient up to date with their annual eye exam?  Yes  Who is the provider or what is the name of the office in which the patient attends annual eye exams? Dr. York Spaniel   Dental Screening: Recommended annual dental exams for proper oral hygiene  Community Resource Referral / Chronic Care Management: CRR required this visit?  No   CCM required this visit?  No      Plan:     I have personally reviewed and noted the following in the patient's chart:   . Medical and social history . Use of alcohol, tobacco or illicit drugs  . Current medications and supplements . Functional ability and status .  Nutritional status . Physical activity . Advanced directives . List of other physicians . Hospitalizations, surgeries, and ER  visits in previous 12 months . Vitals . Screenings to include cognitive, depression, and falls . Referrals and appointments  In addition, I have reviewed and discussed with patient certain preventive protocols, quality metrics, and best practice recommendations. A written personalized care plan for preventive services as well as general preventive health recommendations were provided to patient.    Due to this being a telephonic visit, the after visit summary with patients personalized plan was offered to patient via mail or my-chart.  Patient would like to access on my-chart.   Marta Antu, LPN   [id number]  Nurse Health Advisor  Nurse Notes: None

## 2020-02-28 ENCOUNTER — Other Ambulatory Visit (HOSPITAL_COMMUNITY): Payer: Self-pay | Admitting: Urology

## 2020-02-28 DIAGNOSIS — N401 Enlarged prostate with lower urinary tract symptoms: Secondary | ICD-10-CM | POA: Diagnosis not present

## 2020-02-28 DIAGNOSIS — N200 Calculus of kidney: Secondary | ICD-10-CM | POA: Diagnosis not present

## 2020-02-28 DIAGNOSIS — R351 Nocturia: Secondary | ICD-10-CM | POA: Diagnosis not present

## 2020-02-29 MED FILL — SILODOSIN 8 MG CAPS: 8 | 30 days supply | Qty: 30 | Fill #0

## 2020-03-25 MED FILL — SILODOSIN 8 MG CAPS: 8 | 90 days supply | Qty: 90 | Fill #1

## 2020-04-28 DIAGNOSIS — X32XXXA Exposure to sunlight, initial encounter: Secondary | ICD-10-CM | POA: Diagnosis not present

## 2020-04-28 DIAGNOSIS — L570 Actinic keratosis: Secondary | ICD-10-CM | POA: Diagnosis not present

## 2020-04-28 DIAGNOSIS — D692 Other nonthrombocytopenic purpura: Secondary | ICD-10-CM | POA: Diagnosis not present

## 2020-04-28 DIAGNOSIS — L821 Other seborrheic keratosis: Secondary | ICD-10-CM | POA: Diagnosis not present

## 2020-04-28 DIAGNOSIS — L718 Other rosacea: Secondary | ICD-10-CM | POA: Diagnosis not present

## 2020-04-28 DIAGNOSIS — D485 Neoplasm of uncertain behavior of skin: Secondary | ICD-10-CM | POA: Diagnosis not present

## 2020-04-28 DIAGNOSIS — C44319 Basal cell carcinoma of skin of other parts of face: Secondary | ICD-10-CM | POA: Diagnosis not present

## 2020-04-30 DIAGNOSIS — E785 Hyperlipidemia, unspecified: Secondary | ICD-10-CM | POA: Insufficient documentation

## 2020-04-30 DIAGNOSIS — I4821 Permanent atrial fibrillation: Secondary | ICD-10-CM | POA: Diagnosis not present

## 2020-04-30 DIAGNOSIS — S41111A Laceration without foreign body of right upper arm, initial encounter: Secondary | ICD-10-CM | POA: Diagnosis not present

## 2020-04-30 DIAGNOSIS — I10 Essential (primary) hypertension: Secondary | ICD-10-CM | POA: Diagnosis not present

## 2020-04-30 DIAGNOSIS — I4819 Other persistent atrial fibrillation: Secondary | ICD-10-CM | POA: Diagnosis not present

## 2020-04-30 DIAGNOSIS — Z952 Presence of prosthetic heart valve: Secondary | ICD-10-CM | POA: Diagnosis not present

## 2020-05-07 ENCOUNTER — Other Ambulatory Visit (HOSPITAL_COMMUNITY): Payer: Self-pay

## 2020-05-12 MED FILL — IMIQUIMOD 5% CREAM PACKET: 5 | 30 days supply | Qty: 24 | Fill #0

## 2020-05-28 ENCOUNTER — Other Ambulatory Visit: Payer: Self-pay | Admitting: Physician Assistant

## 2020-05-28 ENCOUNTER — Other Ambulatory Visit: Payer: Self-pay

## 2020-05-28 ENCOUNTER — Encounter: Payer: Self-pay | Admitting: Physician Assistant

## 2020-05-28 ENCOUNTER — Ambulatory Visit (INDEPENDENT_AMBULATORY_CARE_PROVIDER_SITE_OTHER): Payer: 59 | Admitting: Physician Assistant

## 2020-05-28 VITALS — BP 102/70 | HR 47 | Temp 98.2°F | Resp 16 | Ht 71.0 in | Wt 190.0 lb

## 2020-05-28 DIAGNOSIS — Z952 Presence of prosthetic heart valve: Secondary | ICD-10-CM | POA: Diagnosis not present

## 2020-05-28 DIAGNOSIS — Z23 Encounter for immunization: Secondary | ICD-10-CM

## 2020-05-28 MED ORDER — LEVOTHYROXINE SODIUM 50 MCG PO TABS: 50.0000 ug | ORAL_TABLET | Freq: Every day | ORAL | 1 refills | Status: DC

## 2020-05-28 MED FILL — LEVOTHYROXINE 50 MCG TABLET: 50 | 90 days supply | Qty: 90 | Fill #0

## 2020-05-28 NOTE — Progress Notes (Signed)
Patient presents to clinic today to discuss recent visit with his cardiologist in [place], [place], and to get echocardiogram ordered locally.  Patient endorses having a follow-up visit with his cardiologist, Dr. Lorne [name] with [facility].  Patient with history of hypertension, hyperlipidemia, A. fib.  Is status post AVR.  Recent checkup on 04/30/2020 examination was noted to be good.  Was recommended he have a follow-up echocardiogram in 3 to 4 months.  Patient will not be traveling to [place] in that timeframe and would like to have locally. Patient denies chest pain, palpitations, lightheadedness, dizziness, vision changes or frequent headaches.  Patient also requesting flu shot today.  Is also needing refills of levothyroxine.  Past Medical History:  Diagnosis Date  . Arthritis   . Atrial fibrillation ([place])    Cardioversion 2016  . Atrial flutter ([place])    Cardioversion 2013  . Blood transfusion without reported diagnosis   . 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 Dysrhythmia   . GERD (gastroesophageal reflux disease)   . History of aortic valve replacement with bioprosthetic valve    Aortic regurgitation  . History of chicken pox   . History of kidney stones   . History of skin cancer   . History of stroke 2011  . Kidney stone   . Seasonal allergies   . Stroke [facility])    after heart surgery 2010    Current Outpatient Medications on File Prior to Visit  Medication Sig Dispense Refill  . diphenhydrAMINE (BENADRYL) 25 MG tablet Take 25 mg by mouth daily as needed for allergies.    Marland Kitchen ELIQUIS 5 MG TABS tablet Take 1 tablet by mouth daily.     Keith [name] (GLUCOSAMINE MSM COMPLEX) TABS Take 1 tablet by mouth at bedtime.     . metoprolol tartrate (LOPRESSOR) 25 MG tablet Take 25 mg by mouth daily.     . tamsulosin (FLOMAX) 0.4 MG CAPS capsule Take 1 capsule (0.4 mg total) by mouth at bedtime. 90 capsule 1   No current facility-administered  medications on file prior to visit.    Allergies  Allergen Reactions  . Tape     Family History  Problem Relation Age of Onset  . Colon cancer Neg Hx   . Esophageal cancer Neg Hx   . Rectal cancer Neg Hx   . Stomach cancer Neg Hx     Social History   Socioeconomic History  . Marital status: Married    Spouse name: Keith [name]  . Number of children: 1  . Years of education: Not on file  . Highest education level: Not on file  Occupational History  . Occupation: Chief Strategy Officer  Tobacco Use  . Smoking status: Never Smoker  . Smokeless tobacco: Never Used  Vaping Use  . Vaping Use: Never used  Substance and Sexual Activity  . Alcohol use: Yes    Comment: occasional  . Drug use: No  . Sexual activity: Yes  Other Topics Concern  . Not on file  Social History Narrative  . Not on file   Social Determinants of Health   Financial Resource Strain: Low Risk   . Difficulty of Paying Living Expenses: Not hard at all  Food Insecurity: No Food Insecurity  . Worried About Charity fundraiser in the Last Year: Never true  . Ran Out of Food in the Last Year: Never true  Transportation Needs: No Transportation Needs  . Lack of Transportation (Medical): No  . Lack  of Transportation (Non-Medical): No  Physical Activity: Inactive  . Days of Exercise per Week: 0 days  . Minutes of Exercise per Session: 0 min  Stress: No Stress Concern Present  . Feeling of Stress : Not at all  Social Connections: Moderately Integrated  . Frequency of Communication with Friends and Family: More than three times a week  . Frequency of Social Gatherings with Friends and Family: Once a week  . Attends Religious Services: Never  . Active Member of Clubs or Organizations: Yes  . Attends Archivist Meetings: More than 4 times per year  . Marital Status: Married   Review of Systems - See HPI.  All other ROS are negative.  BP 102/70   Pulse (!) 47   Temp 98.2 F (36.8 C) (Temporal)   Resp 16    Ht 5\' 11"  (1.803 m)   Wt 190 lb (86.2 kg)   SpO2 97%   BMI 26.50 kg/m   Physical Exam Vitals reviewed.  Constitutional:      Appearance: Normal appearance.  HENT:     Head: Normocephalic and atraumatic.  Cardiovascular:     Pulses: Normal pulses.     Comments: Chronic irregularly irregular rhythm with A. fib.  Rate controlled. Pulmonary:     Effort: Pulmonary effort is normal.     Breath sounds: Normal breath sounds.  Musculoskeletal:     Cervical back: Neck supple.  Neurological:     General: No focal deficit present.     Mental Status: He is alert and oriented to person, place, and time.  Psychiatric:        Mood and Affect: Mood normal.     Assessment/Plan: 1. S/P AVR (aortic valve replacement) Vital stable today.  Asymptomatic.  Reminder placed in system to order echo for late January 2022.  Will update at that time and forward results to his cardiologist in [place], [place].  Patient to continue management per specialist.  2. Need for immunization against influenza Flu shot updated today. - Flu Vaccine QUAD High Dose(Fluad)  This visit occurred during the SARS-CoV-2 public health emergency.  Safety protocols were in place, including screening questions prior to the visit, additional usage of staff PPE, and extensive cleaning of exam room while observing appropriate contact time as indicated for disinfecting solutions.     Leeanne Rio, PA-C

## 2020-05-28 NOTE — Patient Instructions (Signed)
I am putting in a reminder for your echocardiogram. We will get done in January and forward to your Cardiologist in [place].  I have refilled the medications for you.   Your flu shot was updated today.   Take care! Will see you in March for your physical.

## 2020-06-18 MED FILL — ELIQUIS 5 MG TABLET: 5 | 90 days supply | Qty: 180 | Fill #1

## 2020-06-18 MED FILL — SILODOSIN 8 MG CAPS: 8 | 90 days supply | Qty: 90 | Fill #2

## 2020-06-18 MED FILL — METOPROLOL TARTRATE 25 MG T: 25 | 90 days supply | Qty: 180 | Fill #2

## 2020-07-01 DIAGNOSIS — M19072 Primary osteoarthritis, left ankle and foot: Secondary | ICD-10-CM | POA: Diagnosis not present

## 2020-07-01 DIAGNOSIS — G5792 Unspecified mononeuropathy of left lower limb: Secondary | ICD-10-CM | POA: Diagnosis not present

## 2020-07-01 DIAGNOSIS — T148XXA Other injury of unspecified body region, initial encounter: Secondary | ICD-10-CM | POA: Diagnosis not present

## 2020-07-03 ENCOUNTER — Ambulatory Visit: Payer: 59 | Attending: Internal Medicine

## 2020-07-03 DIAGNOSIS — Z23 Encounter for immunization: Secondary | ICD-10-CM

## 2020-07-03 NOTE — Progress Notes (Signed)
   [facility]  Name:  Earnestine Tuohey.    MRN: [id number] DOB: [birth year]  07/03/2020  Mr. Lotito was observed post Covid-19 immunization for 15 minutes without incident. He was provided with Vaccine Information Sheet and instruction to access the V-Safe system.   Mr. Cardinal was instructed to call 911 with any severe reactions post vaccine: Marland Kitchen Difficulty breathing  . Swelling of face and throat  . A fast heartbeat  . A bad rash all over body  . Dizziness and weakness   Immunizations Administered    Name Date Dose VIS Date Route   Pfizer COVID-19 Vaccine 07/03/2020  3:20 PM 0.3 mL 04/30/2020 Intramuscular   Manufacturer: [place]   Lot: [id number]   NDC: [address]-6

## 2020-07-16 ENCOUNTER — Telehealth: Payer: Self-pay | Admitting: Physician Assistant

## 2020-07-16 DIAGNOSIS — Z952 Presence of prosthetic heart valve: Secondary | ICD-10-CM

## 2020-07-16 NOTE — Telephone Encounter (Signed)
Patient is due for his repeat echocardiogram per our last discussion and at the behest of his cardiologist in [place].  Please let patient know that an order has been placed for him and he will be contacted to schedule.

## 2020-07-16 NOTE — Telephone Encounter (Signed)
-----   Message from Waldon Merl, PA-C sent at 05/28/2020 10:38 AM EST ----- Echo order January.

## 2020-07-17 NOTE — Telephone Encounter (Signed)
Patient is scheduled for 07/21/20 for Echo

## 2020-07-21 ENCOUNTER — Other Ambulatory Visit: Payer: Self-pay

## 2020-07-21 ENCOUNTER — Ambulatory Visit (HOSPITAL_COMMUNITY): Payer: 59 | Attending: Internal Medicine

## 2020-07-21 DIAGNOSIS — Z952 Presence of prosthetic heart valve: Secondary | ICD-10-CM | POA: Diagnosis not present

## 2020-07-21 LAB — ECHOCARDIOGRAM COMPLETE
AV Mean grad: 5.3 mmHg
AV Peak grad: 11 mmHg
Ao pk vel: 1.66 m/s
S' Lateral: 3.3 cm

## 2020-07-21 MED ORDER — PERFLUTREN LIPID MICROSPHERE
1.0000 mL | INTRAVENOUS | Status: AC | PRN
  Administered 2020-07-21: 1 mL via INTRAVENOUS

## 2020-08-05 DIAGNOSIS — H2513 Age-related nuclear cataract, bilateral: Secondary | ICD-10-CM | POA: Diagnosis not present

## 2020-09-09 MED FILL — LEVOTHYROXINE 50 MCG TABLET: 50 | 90 days supply | Qty: 90 | Fill #1

## 2020-09-09 MED FILL — PANTOPRAZOLE SOD DR 40 MG T: 40 | 30 days supply | Qty: 30 | Fill #2

## 2020-09-09 MED FILL — SILODOSIN 8 MG CAPS: 8 | 90 days supply | Qty: 90 | Fill #3

## 2020-09-17 DIAGNOSIS — M5416 Radiculopathy, lumbar region: Secondary | ICD-10-CM | POA: Diagnosis not present

## 2020-09-24 ENCOUNTER — Encounter: Payer: 59 | Admitting: Physician Assistant

## 2020-09-30 DIAGNOSIS — M5416 Radiculopathy, lumbar region: Secondary | ICD-10-CM | POA: Diagnosis not present

## 2020-10-09 ENCOUNTER — Other Ambulatory Visit (HOSPITAL_COMMUNITY): Payer: Self-pay | Admitting: Neurological Surgery

## 2020-10-09 DIAGNOSIS — M5416 Radiculopathy, lumbar region: Secondary | ICD-10-CM | POA: Diagnosis not present

## 2020-10-09 MED FILL — OXYCODONE-APAP 5-325MG: 5-325 | 10 days supply | Qty: 40 | Fill #0

## 2020-10-15 ENCOUNTER — Other Ambulatory Visit: Payer: Self-pay | Admitting: Neurological Surgery

## 2020-10-15 ENCOUNTER — Other Ambulatory Visit (HOSPITAL_COMMUNITY): Payer: Self-pay | Admitting: Neurological Surgery

## 2020-10-15 DIAGNOSIS — M5416 Radiculopathy, lumbar region: Secondary | ICD-10-CM

## 2020-10-16 ENCOUNTER — Other Ambulatory Visit (HOSPITAL_COMMUNITY): Payer: Self-pay

## 2020-10-22 ENCOUNTER — Ambulatory Visit (HOSPITAL_COMMUNITY)
Admission: RE | Admit: 2020-10-22 | Discharge: 2020-10-22 | Disposition: A | Discharge status: Home / Self Care | Payer: 59 | Source: Ambulatory Visit | Attending: Neurological Surgery | Admitting: Neurological Surgery

## 2020-10-22 DIAGNOSIS — M545 Low back pain, unspecified: Secondary | ICD-10-CM | POA: Diagnosis not present

## 2020-10-22 DIAGNOSIS — M5416 Radiculopathy, lumbar region: Secondary | ICD-10-CM | POA: Diagnosis not present

## 2020-10-22 MED ORDER — GADOBUTROL 1 MMOL/ML IV SOLN
8.5000 mL | Freq: Once | INTRAVENOUS | Status: AC | PRN
  Administered 2020-10-22: 8.5 mL via INTRAVENOUS

## 2020-10-23 ENCOUNTER — Other Ambulatory Visit (HOSPITAL_COMMUNITY): Payer: Self-pay

## 2020-10-23 MED ORDER — OXYCODONE-ACETAMINOPHEN 5-325 MG PO TABS
ORAL_TABLET | ORAL | 0 refills | Status: DC
  Filled 2020-10-23: qty 40, 10d supply, fill #0

## 2020-10-24 ENCOUNTER — Other Ambulatory Visit (HOSPITAL_COMMUNITY): Payer: Self-pay

## 2020-10-24 ENCOUNTER — Ambulatory Visit (HOSPITAL_COMMUNITY): Payer: Medicare Other

## 2020-10-24 ENCOUNTER — Encounter (HOSPITAL_COMMUNITY): Payer: Self-pay

## 2020-10-24 MED FILL — Pantoprazole Sodium EC Tab 40 MG (Base Equiv): ORAL | 30 days supply | Qty: 30 | Fill #0 | Status: AC

## 2020-10-30 ENCOUNTER — Other Ambulatory Visit (HOSPITAL_COMMUNITY): Payer: Self-pay

## 2020-10-30 MED ORDER — METHYLPREDNISOLONE 4 MG PO TBPK
ORAL_TABLET | ORAL | 0 refills | Status: DC
  Filled 2020-10-30: qty 21, 6d supply, fill #0

## 2020-11-04 ENCOUNTER — Other Ambulatory Visit (HOSPITAL_COMMUNITY): Payer: Self-pay

## 2020-11-04 DIAGNOSIS — M5416 Radiculopathy, lumbar region: Secondary | ICD-10-CM | POA: Diagnosis not present

## 2020-11-04 MED ORDER — OXYCODONE-ACETAMINOPHEN 5-325 MG PO TABS
ORAL_TABLET | ORAL | 0 refills | Status: DC
  Filled 2020-11-04: qty 40, 10d supply, fill #0

## 2020-11-05 ENCOUNTER — Other Ambulatory Visit (HOSPITAL_COMMUNITY): Payer: Self-pay

## 2020-11-07 ENCOUNTER — Other Ambulatory Visit (HOSPITAL_COMMUNITY): Payer: Self-pay

## 2020-11-07 MED ORDER — CYCLOBENZAPRINE HCL 5 MG PO TABS
5.0000 mg | ORAL_TABLET | Freq: Three times a day (TID) | ORAL | 0 refills | Status: DC
  Filled 2020-11-07: qty 30, 10d supply, fill #0

## 2020-11-13 ENCOUNTER — Other Ambulatory Visit (HOSPITAL_COMMUNITY): Payer: Self-pay

## 2020-11-13 MED ORDER — OXYCODONE-ACETAMINOPHEN 5-325 MG PO TABS
ORAL_TABLET | ORAL | 0 refills | Status: DC
  Filled 2020-11-13: qty 40, 10d supply, fill #0

## 2020-11-14 DIAGNOSIS — M5416 Radiculopathy, lumbar region: Secondary | ICD-10-CM | POA: Diagnosis not present

## 2020-11-14 DIAGNOSIS — Z6826 Body mass index (BMI) 26.0-26.9, adult: Secondary | ICD-10-CM | POA: Diagnosis not present

## 2020-11-14 DIAGNOSIS — R030 Elevated blood-pressure reading, without diagnosis of hypertension: Secondary | ICD-10-CM | POA: Diagnosis not present

## 2020-11-17 ENCOUNTER — Other Ambulatory Visit (HOSPITAL_COMMUNITY): Payer: Self-pay

## 2020-11-17 MED ORDER — CYCLOBENZAPRINE HCL 5 MG PO TABS
ORAL_TABLET | ORAL | 0 refills | Status: DC
  Filled 2020-11-17: qty 30, 10d supply, fill #0

## 2020-11-18 ENCOUNTER — Other Ambulatory Visit: Payer: Self-pay

## 2020-11-18 ENCOUNTER — Emergency Department (HOSPITAL_COMMUNITY)
Admission: EM | Admit: 2020-11-18 | Discharge: 2020-11-18 | Disposition: A | Discharge status: Home / Self Care | Payer: 59 | Attending: Emergency Medicine | Admitting: Emergency Medicine

## 2020-11-18 ENCOUNTER — Encounter (HOSPITAL_COMMUNITY): Payer: Self-pay | Admitting: *Deleted

## 2020-11-18 DIAGNOSIS — E039 Hypothyroidism, unspecified: Secondary | ICD-10-CM | POA: Insufficient documentation

## 2020-11-18 DIAGNOSIS — M5442 Lumbago with sciatica, left side: Secondary | ICD-10-CM | POA: Diagnosis not present

## 2020-11-18 DIAGNOSIS — Z955 Presence of coronary angioplasty implant and graft: Secondary | ICD-10-CM | POA: Insufficient documentation

## 2020-11-18 DIAGNOSIS — G8929 Other chronic pain: Secondary | ICD-10-CM | POA: Diagnosis not present

## 2020-11-18 DIAGNOSIS — M549 Dorsalgia, unspecified: Secondary | ICD-10-CM | POA: Diagnosis present

## 2020-11-18 DIAGNOSIS — M5432 Sciatica, left side: Secondary | ICD-10-CM | POA: Diagnosis not present

## 2020-11-18 DIAGNOSIS — Z85828 Personal history of other malignant neoplasm of skin: Secondary | ICD-10-CM | POA: Insufficient documentation

## 2020-11-18 DIAGNOSIS — Z7901 Long term (current) use of anticoagulants: Secondary | ICD-10-CM | POA: Diagnosis not present

## 2020-11-18 MED ORDER — LIDOCAINE 5 % EX PTCH
1.0000 | MEDICATED_PATCH | CUTANEOUS | Status: DC
  Administered 2020-11-18: 1 via TRANSDERMAL
  Filled 2020-11-18: qty 1

## 2020-11-18 MED ORDER — OXYCODONE HCL 5 MG PO TABS
5.0000 mg | ORAL_TABLET | Freq: Once | ORAL | Status: AC
  Administered 2020-11-18: 5 mg via ORAL
  Filled 2020-11-18: qty 1

## 2020-11-18 MED ORDER — NAPROXEN 250 MG PO TABS
375.0000 mg | ORAL_TABLET | Freq: Once | ORAL | Status: AC
  Administered 2020-11-18: 375 mg via ORAL
  Filled 2020-11-18: qty 2

## 2020-11-18 MED ORDER — OXYCODONE-ACETAMINOPHEN 5-325 MG PO TABS
1.0000 | ORAL_TABLET | Freq: Three times a day (TID) | ORAL | 0 refills | Status: DC | PRN
  Filled 2020-11-18 – 2020-11-24 (×2): qty 6, 2d supply, fill #0

## 2020-11-18 NOTE — ED Provider Notes (Signed)
[facility] EMERGENCY DEPARTMENT Provider Note   CSN: [id number] Arrival date & time: 11/18/20  1810     History Chief Complaint  Patient presents with  . Back Pain    Keith Pilch. is a 80 y.o. male.  HPI    80 year old male comes in a chief complaint of back pain.  He has history of degenerative spine disease, has required surgical care before and is being managed by Dr. Ronnald Ramp, neurosurgery right now.  He reports that more recently he started having worsening back pain, that has been managed conservatively with pain medication and shots.  He is out of his pain meds 2 days ago, and having severe pain.  He is unable to walk because of pain.  The pain is radiating to the left knee and there is associated tingling there. Pt has no associated  weakness, urinary incontinence, urinary retention, bowel incontinence, pins and needle sensation in the perineal area.     Past Medical History:  Diagnosis Date  . Arthritis   . Atrial fibrillation ([place])    Cardioversion 2016  . Atrial flutter ([place])    Cardioversion 2013  . Blood transfusion without reported diagnosis   . 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 Dysrhythmia   . GERD (gastroesophageal reflux disease)   . History of aortic valve replacement with bioprosthetic valve    Aortic regurgitation  . History of chicken pox   . History of kidney stones   . History of skin cancer   . History of stroke 2011  . Kidney stone   . Seasonal allergies   . Stroke [facility])    after heart surgery 2010    Patient Active Problem List   Diagnosis Date Noted  . Hyperlipidemia 04/30/2020  . Cataract 10/10/2019  . Right ureteral stone 10/01/2019  . Left ureteral stone 10/01/2019  . S/P lumbar fusion 06/15/2019  . 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 Benign prostatic hyperplasia with nocturia 08/10/2017  . S/P lumbar laminectomy 06/01/2017  . PAF (paroxysmal atrial fibrillation) ([place]) 09/07/2016  . S/P AVR 01/18/2014  .  Hypertension 02/22/2013    Past Surgical History:  Procedure Laterality Date  . AORTIC VALVE REPLACEMENT  2009   Bioprosthetic AVR and root replacement  . BACK SURGERY     total of 4 back surgeries  . CATARACT EXTRACTION, BILATERAL    . COLONOSCOPY    . CYSTOSCOPY WITH RETROGRADE PYELOGRAM, URETEROSCOPY AND STENT PLACEMENT Bilateral 10/01/2019   Procedure: CYSTOSCOPY WITH RETROGRADE PYELOGRAM, URETEROSCOPY AND STENT PLACEMENT;  Surgeon: Irine Seal, MD;  Location: [facility];  Service: Urology;  Laterality: Bilateral;  . EPIGASTRIC HERNIA REPAIR    . HERNIA REPAIR    . LUMBAR LAMINECTOMY/DECOMPRESSION MICRODISCECTOMY Right 06/01/2017   Procedure: EXTRAFORAMINAL MICRODISCECTOMY LUMBAR FIVE- SACRAL ONE, RIGHT;  Surgeon: Eustace Moore, MD;  Location: [place];  Service: Neurosurgery;  Laterality: Right;  . LUMBAR LAMINECTOMY/DECOMPRESSION MICRODISCECTOMY Bilateral 09/04/2018   Procedure: Laminectomy and Foraminotomy - Lumbar two-three bilateral;  Surgeon: Eustace Moore, MD;  Location: [place];  Service: Neurosurgery;  Laterality: Bilateral;  . TRANSFORAMINAL LUMBAR INTERBODY FUSION (TLIF) WITH PEDICLE SCREW FIXATION 1 LEVEL Left 06/15/2019   Procedure: Decompression and Instrumention Fusion Lumbar Four- Five;  Surgeon: Eustace Moore, MD;  Location: [place];  Service: Neurosurgery;  Laterality: Left;  Decompression and Instrumention Fusion Lumbar Four- Five       Family History  Problem Relation Age of Onset  . Colon cancer Neg Hx   .  Esophageal cancer Neg Hx   . Rectal cancer Neg Hx   . Stomach cancer Neg Hx     Social History   Tobacco Use  . Smoking status: Never Smoker  . Smokeless tobacco: Never Used  Vaping Use  . Vaping Use: Never used  Substance Use Topics  . Alcohol use: Yes    Comment: occasional  . Drug use: No    Home Medications Prior to Admission medications   Medication Sig Start Date End Date Taking? Authorizing Provider  finasteride (PROSCAR) 5 MG tablet  11/10/19   Yes [provider]  oxyCODONE-acetaminophen (PERCOCET/ROXICET) 5-325 MG tablet Take 1 tablet by mouth every 8 (eight) hours as needed for severe pain. 11/18/20  Yes Varney Biles, MD  cyclobenzaprine (FLEXERIL) 5 MG tablet Take 1 tablet by mouth 3 times daily 11/17/20     diphenhydrAMINE (BENADRYL) 25 MG tablet Take 25 mg by mouth daily as needed for allergies.    [provider]  ELIQUIS 5 MG TABS tablet Take 1 tablet by mouth daily.  06/25/19   [provider]  Glucos-MSM-C-Mn-Ginger-Willow (GLUCOSAMINE MSM COMPLEX) TABS Take 1 tablet by mouth at bedtime.     [provider]  imiquimod (ALDARA) 5 % cream APPLY TO AFFECTED AREAS SCALP TWICE WEEKLY AT BEDTIME FOR 16 WEEKS 05/07/20 05/07/21    levothyroxine (SYNTHROID) 50 MCG tablet TAKE 1 TABLET BY MOUTH ONCE A DAY BEFORE BREAKFAST 05/28/20 05/28/21  Brunetta Jeans, PA-C  methylPREDNISolone (MEDROL DOSEPAK) 4 MG TBPK tablet TAKE BY MOUTH AS DIRECTED 10/30/20     pantoprazole (PROTONIX) 40 MG tablet TAKE 1 TABLET BY MOUTH ONCE DAILY Patient not taking: Reported on 01/25/2020 10/26/19 11/29/20  Brunetta Jeans, PA-C  silodosin (RAPAFLO) 8 MG CAPS capsule TAKE 1 CAPSULE BY MOUTH ONCE A DAY 02/28/20 02/27/21  Irine Seal, MD  tamsulosin (FLOMAX) 0.4 MG CAPS capsule Take 1 capsule (0.4 mg total) by mouth at bedtime. 08/14/19   Brunetta Jeans, PA-C    Allergies    Tape  Review of Systems   Review of Systems  Constitutional: Positive for activity change.  Musculoskeletal: Positive for back pain.  Allergic/Immunologic: Negative for immunocompromised state.  Neurological: Positive for numbness. Negative for weakness.  Hematological: Bruises/bleeds easily.  All other systems reviewed and are negative.   Physical Exam Updated Vital Signs BP (!) 145/85   Pulse 90   Temp 97.7 F (36.5 C) (Oral)   Resp 16   Ht 5\' 11"  (1.803 m)   Wt 85.3 kg   SpO2 98%   BMI 26.22 kg/m   Physical Exam Vitals and nursing  note reviewed.  Constitutional:      Appearance: He is well-developed.  HENT:     Head: Atraumatic.  Cardiovascular:     Rate and Rhythm: Normal rate.  Pulmonary:     Effort: Pulmonary effort is normal.  Musculoskeletal:     Cervical back: Neck supple.     Comments: Pt has tenderness over the lumbar region No step offs, no erythema. Pt has 2+ patellar reflex - R and 1 + on L Able to discriminate between sharp and dull.    Skin:    General: Skin is warm.  Neurological:     Mental Status: He is alert and oriented to person, place, and time.     ED Results / Procedures / Treatments   Labs (all labs ordered are listed, but only abnormal results are displayed) Labs Reviewed - No data to display  EKG None  Radiology No results found.  Procedures Procedures   Medications Ordered in ED Medications  oxyCODONE (Oxy IR/ROXICODONE) immediate release tablet 5 mg (has no administration in time range)  naproxen (NAPROSYN) tablet 375 mg (has no administration in time range)  lidocaine (LIDODERM) 5 % 1 patch (has no administration in time range)    ED Course  I have reviewed the triage vital signs and the nursing notes.  Pertinent labs & imaging results that were available during my care of the patient were reviewed by me and considered in my medical decision making (see chart for details).    MDM Rules/Calculators/A&P                          80 year old male with chronic back pain comes in with chief complaint of back pain.  Positive associated radiculopathy.  No red flags suggesting cord compression or cauda equina.  Does not appear to be a cord syndrome either.  Pain likely secondary to his chronic issues.  Narcotic database reviewed and patient is receiving heavy narcotic medications from orthopedist.  We will give him 6 tabs to go home, but he has been informed that it is not appropriate for for chronic pain to be managed in the ER.  Final Clinical Impression(s) / ED  Diagnoses Final diagnoses:  Chronic midline low back pain with left-sided sciatica    Rx / DC Orders ED Discharge Orders         Ordered    oxyCODONE-acetaminophen (PERCOCET/ROXICET) 5-325 MG tablet  Every 8 hours PRN        11/18/20 2324           Varney Biles, MD 11/18/20 2327

## 2020-11-18 NOTE — ED Notes (Signed)
Pt returned to hallway 2 stretcher.

## 2020-11-18 NOTE — ED Notes (Signed)
Pt presents to nurses station and requests his discharge papers. Advised pt that provider has not completed discharge papers at this time. This nurse requested pt to return to hallway 2 stretcher. Pt refused and states "I'm not bitching, I'm just hurting and can't sit there any longer". Pt continues to stand at nurses station.

## 2020-11-18 NOTE — ED Notes (Signed)
Patient returns to nurses station and requests this nurses name and advises that his wife works at this facility. Patient returns to stretcher and calls his wife and advises her of his wait and that he is waiting for his discharge papers.

## 2020-11-18 NOTE — ED Notes (Signed)
Dr. Marcina Millard reexamined pt and approved discharge.

## 2020-11-18 NOTE — ED Notes (Signed)
Pt continues to stand at nurses station.

## 2020-11-18 NOTE — ED Provider Notes (Signed)
Emergency Medicine Provider Triage Evaluation Note  Keith [name]. , a 80 y.o. male  was evaluated in triage.  Pt complains of lower back pain going on for last couple days, states she has a flareup, has associated numbness and tingling going down his legs, denies urinary retention, incontinence, difficult bowel movements.  Review of Systems  Positive: Lower back pain, paresthesia weakness in the lower extremities Negative: Denies difficulty with urination, urinary tonsils, difficult bowel movements  Physical Exam  BP (!) 149/97   Pulse 86   Temp 97.7 F (36.5 C) (Oral)   Resp 18   Ht 5\' 11"  (1.803 m)   Wt 85.3 kg   SpO2 97%   BMI 26.22 kg/m  Gen:   Awake, no distress   Resp:  Normal effort  MSK:   Moves extremities without difficulty  Other:    Medical Decision Making  Medically screening exam initiated at 8:24 PM.  Appropriate orders placed.  Keith [name]. was informed that the remainder of the evaluation will be completed by another provider, this initial triage assessment does not replace that evaluation, and the importance of remaining in the ED until their evaluation is complete.  Patient presents with lower back pain, he will need further work-up here in emergency department.   Marcello Fennel, PA-C 11/18/20 2025    Varney Biles, MD 11/19/20 1525

## 2020-11-18 NOTE — ED Notes (Signed)
Pts bp elevated. Pt states that his bp goes up when he is in severe pain. Dr. Kathrynn Humble notified.

## 2020-11-18 NOTE — ED Triage Notes (Signed)
Back pain x 2 months, states he needs pain control

## 2020-11-18 NOTE — Discharge Instructions (Addendum)
See the neurosurgeon as planned.  We will give you some more pain medication to help bridge, but either the neurosurgeon or primary care doctor will have to prescribe you more pain meds.

## 2020-11-19 ENCOUNTER — Other Ambulatory Visit (HOSPITAL_COMMUNITY): Payer: Self-pay

## 2020-11-20 ENCOUNTER — Other Ambulatory Visit (HOSPITAL_COMMUNITY): Payer: Self-pay | Admitting: Neurological Surgery

## 2020-11-20 DIAGNOSIS — R030 Elevated blood-pressure reading, without diagnosis of hypertension: Secondary | ICD-10-CM | POA: Diagnosis not present

## 2020-11-20 DIAGNOSIS — R1032 Left lower quadrant pain: Secondary | ICD-10-CM

## 2020-11-21 ENCOUNTER — Other Ambulatory Visit: Payer: Self-pay

## 2020-11-21 ENCOUNTER — Ambulatory Visit (HOSPITAL_COMMUNITY)
Admission: RE | Admit: 2020-11-21 | Discharge: 2020-11-21 | Disposition: A | Discharge status: Home / Self Care | Payer: 59 | Source: Ambulatory Visit | Attending: Neurological Surgery | Admitting: Neurological Surgery

## 2020-11-21 DIAGNOSIS — S73192A Other sprain of left hip, initial encounter: Secondary | ICD-10-CM | POA: Diagnosis not present

## 2020-11-21 DIAGNOSIS — S76312A Strain of muscle, fascia and tendon of the posterior muscle group at thigh level, left thigh, initial encounter: Secondary | ICD-10-CM | POA: Diagnosis not present

## 2020-11-21 DIAGNOSIS — R1032 Left lower quadrant pain: Secondary | ICD-10-CM | POA: Diagnosis not present

## 2020-11-21 DIAGNOSIS — M1612 Unilateral primary osteoarthritis, left hip: Secondary | ICD-10-CM | POA: Diagnosis not present

## 2020-11-21 DIAGNOSIS — M87852 Other osteonecrosis, left femur: Secondary | ICD-10-CM | POA: Diagnosis not present

## 2020-11-22 ENCOUNTER — Other Ambulatory Visit (HOSPITAL_COMMUNITY): Payer: Self-pay

## 2020-11-22 MED ORDER — OXYCODONE-ACETAMINOPHEN 5-325 MG PO TABS
1.0000 | ORAL_TABLET | Freq: Four times a day (QID) | ORAL | 0 refills | Status: DC | PRN
  Filled 2020-11-26: qty 40, 10d supply, fill #0

## 2020-11-24 ENCOUNTER — Other Ambulatory Visit (HOSPITAL_COMMUNITY): Payer: Self-pay

## 2020-11-26 ENCOUNTER — Other Ambulatory Visit (HOSPITAL_COMMUNITY): Payer: Self-pay

## 2020-12-01 ENCOUNTER — Other Ambulatory Visit: Payer: Self-pay | Admitting: Student

## 2020-12-01 DIAGNOSIS — R1032 Left lower quadrant pain: Secondary | ICD-10-CM

## 2020-12-03 ENCOUNTER — Other Ambulatory Visit (HOSPITAL_COMMUNITY): Payer: Self-pay

## 2020-12-03 MED ORDER — OXYCODONE-ACETAMINOPHEN 5-325 MG PO TABS
ORAL_TABLET | ORAL | 0 refills | Status: DC
  Filled 2020-12-03 – 2020-12-04 (×2): qty 40, 10d supply, fill #0

## 2020-12-03 MED ORDER — CYCLOBENZAPRINE HCL 5 MG PO TABS
5.0000 mg | ORAL_TABLET | Freq: Three times a day (TID) | ORAL | 0 refills | Status: DC
  Filled 2020-12-03: qty 30, 10d supply, fill #0

## 2020-12-04 ENCOUNTER — Encounter: Payer: Self-pay | Admitting: Physician Assistant

## 2020-12-04 ENCOUNTER — Ambulatory Visit (INDEPENDENT_AMBULATORY_CARE_PROVIDER_SITE_OTHER): Payer: 59

## 2020-12-04 ENCOUNTER — Other Ambulatory Visit (HOSPITAL_COMMUNITY): Payer: Self-pay

## 2020-12-04 ENCOUNTER — Ambulatory Visit (INDEPENDENT_AMBULATORY_CARE_PROVIDER_SITE_OTHER): Payer: 59 | Admitting: Physician Assistant

## 2020-12-04 ENCOUNTER — Other Ambulatory Visit: Payer: Self-pay

## 2020-12-04 DIAGNOSIS — M1612 Unilateral primary osteoarthritis, left hip: Secondary | ICD-10-CM | POA: Insufficient documentation

## 2020-12-04 NOTE — Progress Notes (Signed)
Office Visit Note   Patient: Keith [name].           Date of Birth: 15-Mar-1941           MRN: [id number] Visit Date: 12/04/2020              Requested by: No referring provider defined for this encounter. PCP: Default, Provider, MD   Assessment & Plan: Visit Diagnoses:  1. Primary osteoarthritis of left hip     Plan: Given patient's radiographic findings and MRI findings of moderate hip arthritis and the fact that he is having severe pain that is severely limiting his activities of daily living recommend left total hip arthroplasty.  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  Patient would like to proceed with left total hip arthroplasty in the near future.  Questions were encouraged and answered by Keith [name] and myself.  Postoperative protocol discussed with patient.  Surgical procedure discussed with patient at length.  Handout on total hip arthroplasty was given.  Risk discussed with patient.  Risk include but are not limited to DVT/PE, wound healing problems, wound infection, prolonged pain, worsening pain, blood loss, nerve vessel injury,and leg length discrepancy.  Pulse see him back 2 weeks postop.  Follow-Up Instructions: Return for post op.   Orders:  Orders Placed This Encounter  Procedures  . XR HIP UNILAT W OR W/O PELVIS 2-3 VIEWS LEFT   No orders of the defined types were placed in this encounter.     Procedures: No procedures performed   Clinical Data: No additional findings.   Subjective: Chief Complaint  Patient presents with  . Left Hip - Pain    HPI Keith [name] is 80 year old male comes in today with left hip pain.  He has had no known injury to the left hip.  He has been worked up by his neurosurgeon Keith [name] who is given him injections in his back.  He is also underwent a lumbar laminectomy L2-3.  Keith [name] ordered an MRI  of his left hip after ruling out his back as the source of his pain.  I reviewed the MRI with the patient today.  Shows moderate bilateral osteoarthritis both hips.  Avascular changes involving the right femoral head and the left femoral head.  Joint space bilateral hips is moderately narrowed.  No acute fractures. Patient reports that he is having left groin pain.  States the pain goes down the left leg to the knee area.  Pain is worse with motion of the hip.  He has trouble putting on shoes and socks.  His pains became worse over the last 6 weeks.  He is now using a walker for the last 2 weeks and has significant pain whenever ambulating and also just when seated. Has a history of a stroke some years ago has some residual weakness left upper extremity and left lower extremity especially when he gets tired.  He is on no anticoagulants at this point in time.  He has been taking Percocet Flexeril and ibuprofen for the pain in the hip.  Patient reports he had an echocardiogram in January  of this year..  The EF was read at 60 to 65% per the echo report he had mild left ventricular hypertrophy.  Review of Systems Negative for fevers, chills, shortness of breath or chest pain.  Objective: Vital Signs: There were no  vitals taken for this visit.  Physical Exam Constitutional:      Appearance: He is normal weight. He is not ill-appearing or diaphoretic.  Pulmonary:     Effort: Pulmonary effort is normal.  Neurological:     Mental Status: He is alert and oriented to person, place, and time.  Psychiatric:        Mood and Affect: Mood normal.     Ortho Exam Right hip fluid range of motion some minimal discomfort.  Left hip he has good range of motion but significant discomfort with internal and external rotation.  Calf supple nontender bilaterally dorsiflexion plantarflexion bilateral ankles intact. Specialty Comments:  No specialty comments available.  Imaging: XR HIP UNILAT W OR W/O PELVIS 2-3  VIEWS LEFT  Result Date: 12/04/2020 AP pelvis lateral view left hip: Both hips well located no acute fractures.  Moderate narrowing bilateral hip joints with sclerotic changes in the femoral heads bilaterally.  No other bony abnormalities.    PMFS History: Patient Active Problem List   Diagnosis Date Noted  . Primary osteoarthritis of left hip 12/04/2020  . Hyperlipidemia 04/30/2020  . Cataract 10/10/2019  . Right ureteral stone 10/01/2019  . Left ureteral stone 10/01/2019  . S/P lumbar fusion 06/15/2019  . 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 Benign prostatic hyperplasia with nocturia 08/10/2017  . S/P lumbar laminectomy 06/01/2017  . PAF (paroxysmal atrial fibrillation) ([place]) 09/07/2016  . S/P AVR 01/18/2014  . Hypertension 02/22/2013   Past Medical History:  Diagnosis Date  . Arthritis   . Atrial fibrillation ([place])    Cardioversion 2016  . Atrial flutter ([place])    Cardioversion 2013  . Blood transfusion without reported diagnosis   . 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 Dysrhythmia   . GERD (gastroesophageal reflux disease)   . History of aortic valve replacement with bioprosthetic valve    Aortic regurgitation  . History of chicken pox   . History of kidney stones   . History of skin cancer   . History of stroke 2011  . Kidney stone   . Seasonal allergies   . Stroke [facility])    after heart surgery 2010    Family History  Problem Relation Age of Onset  . Colon cancer Neg Hx   . Esophageal cancer Neg Hx   . Rectal cancer Neg Hx   . Stomach cancer Neg Hx     Past Surgical History:  Procedure Laterality Date  . AORTIC VALVE REPLACEMENT  2009   Bioprosthetic AVR and root replacement  . BACK SURGERY     total of 4 back surgeries  . CATARACT EXTRACTION, BILATERAL    . COLONOSCOPY    . CYSTOSCOPY WITH RETROGRADE PYELOGRAM, URETEROSCOPY AND STENT PLACEMENT Bilateral 10/01/2019   Procedure: CYSTOSCOPY WITH RETROGRADE PYELOGRAM, URETEROSCOPY AND STENT  PLACEMENT;  Surgeon: Irine Seal, MD;  [facility];  Service: Urology;  Laterality: Bilateral;  . EPIGASTRIC HERNIA REPAIR    . HERNIA REPAIR    . LUMBAR LAMINECTOMY/DECOMPRESSION MICRODISCECTOMY Right 06/01/2017   Procedure: EXTRAFORAMINAL MICRODISCECTOMY LUMBAR FIVE- SACRAL ONE, RIGHT;  Surgeon: Eustace Moore, MD;  Location: [place];  Service: Neurosurgery;  Laterality: Right;  . LUMBAR LAMINECTOMY/DECOMPRESSION MICRODISCECTOMY Bilateral 09/04/2018   Procedure: Laminectomy and Foraminotomy - Lumbar two-three bilateral;  Surgeon: Eustace Moore, MD;  Location: [place];  Service: Neurosurgery;  Laterality: Bilateral;  . TRANSFORAMINAL LUMBAR INTERBODY FUSION (TLIF) WITH PEDICLE SCREW FIXATION 1 LEVEL Left 06/15/2019   Procedure: Decompression and  Instrumention Fusion Lumbar Four- Five;  Surgeon: Eustace Moore, MD;  Location: [place];  Service: Neurosurgery;  Laterality: Left;  Decompression and Instrumention Fusion Lumbar Four- Five   Social History   Occupational History  . Occupation: Chief Strategy Officer  Tobacco Use  . Smoking status: Never Smoker  . Smokeless tobacco: Never Used  Vaping Use  . Vaping Use: Never used  Substance and Sexual Activity  . Alcohol use: Yes    Comment: occasional  . Drug use: No  . Sexual activity: Yes

## 2020-12-12 ENCOUNTER — Other Ambulatory Visit: Payer: Self-pay | Admitting: Physician Assistant

## 2020-12-15 NOTE — Patient Instructions (Addendum)
DUE TO COVID-19 ONLY ONE VISITOR IS ALLOWED TO COME WITH YOU AND STAY IN THE WAITING ROOM ONLY DURING PRE OP AND PROCEDURE.   **NO VISITORS ARE ALLOWED IN THE SHORT STAY AREA OR RECOVERY ROOM!!**  IF YOU WILL BE ADMITTED INTO THE HOSPITAL YOU ARE ALLOWED ONLY TWO SUPPORT PEOPLE DURING VISITATION HOURS ONLY (10AM -8PM)   The support person(s) may change daily. The support person(s) must pass our screening, gel in and out, and wear a mask at all times, including in the patient's room. Patients must also wear a mask when staff or their support person are in the room.  No visitors under the age of 62. Any visitor under the age of 74 must be accompanied by an adult.    COVID SWAB TESTING MUST BE COMPLETED ON:  12/18/20 @ 9:45 AM   [address] [place], [place] [address]   You are not required to quarantine, however you are required to wear a well-fitted mask when you are out and around people not in your household.  Hand Hygiene often Do NOT share personal items Notify your provider if you are in close contact with someone who has COVID or you develop fever 100.4 or greater, new onset of sneezing, cough, sore throat, shortness of breath or body aches.   Your procedure is scheduled on: 12/19/20   Report to [facility] Main  Entrance    Report to Short Stay at 5:45 AM   Call this number if you have problems the morning of surgery [phone]   Do not eat food :After Midnight.   May have liquids until  5:15 AM  day of surgery  CLEAR LIQUID DIET  Foods Allowed                                                                     Foods Excluded  Water, Black Coffee and tea, regular and decaf             liquids that you cannot  Plain Jell-O in any flavor  (No red)                                    see through such as: Fruit ices (not with fruit pulp)                                            milk, soups, orange juice              Iced Popsicles (No red)                                                 All solid food                                   Apple juices Sports drinks like Gatorade (No red) Lightly  seasoned clear broth or consume(fat free) Sugar, honey syrup    Complete one Ensure drink the morning of surgery at 5:30 AM the day of surgery.      The day of surgery:  Drink ONE (1) Pre-Surgery Clear Ensure by 5:30 AM the morning of surgery. Drink in one sitting. Do not sip.  This drink was given to you during your hospital  pre-op appointment visit. Nothing else to drink after completing the  Pre-Surgery Clear Ensure.          If you have questions, please contact your surgeon's office.     Oral Hygiene is also important to reduce your risk of infection.                                    Remember - BRUSH YOUR TEETH THE MORNING OF SURGERY WITH YOUR REGULAR TOOTHPASTE   Do NOT smoke after Midnight   Take these medicines the morning of surgery with A SIP OF WATER:  Cyclobenzaprine, Levothyroxine, Oxycodone- Acetominaphen, Famotidine, Metoprolol, Pantoprazole.                               You may not have any metal on your body including jewelry, and body piercing             Do not wear powders, cologne, or deodorant              Men may shave face and neck.   Do not bring valuables to the hospital. [place].   Contacts, dentures or bridgework may not be worn into surgery.   Bring small overnight bag day of surgery.   Special Instructions: Bring a copy of your healthcare power of attorney and living will documents         the day of surgery if you haven't scanned them  in before.              Please read over the following fact sheets you were given: IF YOU HAVE QUESTIONS ABOUT YOUR PRE OP INSTRUCTIONS PLEASE CALL  [phone]   [facility] - Preparing for Surgery Before surgery, you can play an important role.  Because skin is not sterile, your skin needs to be as free of germs as possible.  You can reduce  the number of germs on your skin by washing with CHG (chlorahexidine gluconate) soap before surgery.  CHG is an antiseptic cleaner which kills germs and bonds with the skin to continue killing germs even after washing. Please DO NOT use if you have an allergy to CHG or antibacterial soaps.  If your skin becomes reddened/irritated stop using the CHG and inform your nurse when you arrive at Short Stay. Do not shave (including legs and underarms) for at least 48 hours prior to the first CHG shower.  You may shave your face/neck.  Please follow these instructions carefully:  1.  Shower with CHG Soap the night before surgery and the  morning of surgery.  2.  If you choose to wash your hair, wash your hair first as usual with your normal  shampoo.  3.  After you shampoo, rinse your hair and body thoroughly to remove the shampoo.  4.  Use CHG as you would any other liquid soap.  You can apply chg directly to the skin and wash.  Gently with a scrungie or clean washcloth.  5.  Apply the CHG Soap to your body ONLY FROM THE NECK DOWN.   Do   not use on face/ open                           Wound or open sores. Avoid contact with eyes, ears mouth and   genitals (private parts).                       Wash face,  Genitals (private parts) with your normal soap.             6.  Wash thoroughly, paying special attention to the area where your    surgery  will be performed.  7.  Thoroughly rinse your body with warm water from the neck down.  8.  DO NOT shower/wash with your normal soap after using and rinsing off the CHG Soap.                9.  Pat yourself dry with a clean towel.            10.  Wear clean pajamas.            11.  Place clean sheets on your bed the night of your first shower and do not  sleep with pets. Day of Surgery : Do not apply any lotions/deodorants the morning of surgery.  Please wear clean clothes to the hospital/surgery center.  FAILURE TO FOLLOW THESE  INSTRUCTIONS MAY RESULT IN THE CANCELLATION OF YOUR SURGERY  PATIENT SIGNATURE_________________________________  NURSE SIGNATURE__________________________________  ________________________________________________________________________   Adam Phenix  An incentive spirometer is a tool that can help keep your lungs clear and active. This tool measures how well you are filling your lungs with each breath. Taking long deep breaths may help reverse or decrease the chance of developing breathing (pulmonary) problems (especially infection) following: A long period of time when you are unable to move or be active. BEFORE THE PROCEDURE  If the spirometer includes an indicator to show your best effort, your nurse or respiratory therapist will set it to a desired goal. If possible, sit up straight or lean slightly forward. Try not to slouch. Hold the incentive spirometer in an upright position. INSTRUCTIONS FOR USE  Sit on the edge of your bed if possible, or sit up as far as you can in bed or on a chair. Hold the incentive spirometer in an upright position. Breathe out normally. Place the mouthpiece in your mouth and seal your lips tightly around it. Breathe in slowly and as deeply as possible, raising the piston or the ball toward the top of the column. Hold your breath for 3-5 seconds or for as long as possible. Allow the piston or ball to fall to the bottom of the column. Remove the mouthpiece from your mouth and breathe out normally. Rest for a few seconds and repeat Steps 1 through 7 at least 10 times every 1-2 hours when you are awake. Take your time and take a few normal breaths between deep breaths. The spirometer may include an indicator to show your best effort. Use the indicator as a goal to work toward during each repetition. After each set of 10 deep breaths, practice coughing to be sure your  lungs are clear. If you have an incision (the cut made at the time of surgery),  support your incision when coughing by placing a pillow or rolled up towels firmly against it. Once you are able to get out of bed, walk around indoors and cough well. You may stop using the incentive spirometer when instructed by your caregiver.  RISKS AND COMPLICATIONS Take your time so you do not get dizzy or light-headed. If you are in pain, you may need to take or ask for pain medication before doing incentive spirometry. It is harder to take a deep breath if you are having pain. AFTER USE Rest and breathe slowly and easily. It can be helpful to keep track of a log of your progress. Your caregiver can provide you with a simple table to help with this. If you are using the spirometer at home, follow these instructions: [place] IF:  You are having difficultly using the spirometer. You have trouble using the spirometer as often as instructed. Your pain medication is not giving enough relief while using the spirometer. You develop fever of 100.5 F (38.1 C) or higher. SEEK IMMEDIATE MEDICAL CARE IF:  You cough up bloody sputum that had not been present before. You develop fever of 102 F (38.9 C) or greater. You develop worsening pain at or near the incision site. MAKE SURE YOU:  Understand these instructions. Will watch your condition. Will get help right away if you are not doing well or get worse. Document Released: 11/08/2006 Document Revised: 09/20/2011 Document Reviewed: 01/09/2007 Exit[employer] Patient Information 2014 [employer], [place].   ________________________________________________________________________  WHAT IS A BLOOD TRANSFUSION? Blood Transfusion Information  A transfusion is the replacement of blood or some of its parts. Blood is made up of multiple cells which provide different functions. Red blood cells carry oxygen and are used for blood loss replacement. White blood cells fight against infection. Platelets control bleeding. Plasma helps clot  blood. Other blood products are available for specialized needs, such as hemophilia or other clotting disorders. BEFORE THE TRANSFUSION  Who gives blood for transfusions?  Healthy volunteers who are fully evaluated to make sure their blood is safe. This is blood bank blood. Transfusion therapy is the safest it has ever been in the practice of medicine. Before blood is taken from a donor, a complete history is taken to make sure that person has no history of diseases nor engages in risky social behavior (examples are intravenous drug use or sexual activity with multiple partners). The donor's travel history is screened to minimize risk of transmitting infections, such as malaria. The donated blood is tested for signs of infectious diseases, such as HIV and hepatitis. The blood is then tested to be sure it is compatible with you in order to minimize the chance of a transfusion reaction. If you or a relative donates blood, this is often done in anticipation of surgery and is not appropriate for emergency situations. It takes many days to process the donated blood. RISKS AND COMPLICATIONS Although transfusion therapy is very safe and saves many lives, the main dangers of transfusion include:  Getting an infectious disease. Developing a transfusion reaction. This is an allergic reaction to something in the blood you were given. Every precaution is taken to prevent this. The decision to have a blood transfusion has been considered carefully by your caregiver before blood is given. Blood is not given unless the benefits outweigh the risks. AFTER THE TRANSFUSION Right after receiving a blood transfusion,  you will usually feel much better and more energetic. This is especially true if your red blood cells have gotten low (anemic). The transfusion raises the level of the red blood cells which carry oxygen, and this usually causes an energy increase. The nurse administering the transfusion will monitor you  carefully for complications. HOME CARE INSTRUCTIONS  No special instructions are needed after a transfusion. You may find your energy is better. Speak with your caregiver about any limitations on activity for underlying diseases you may have. SEEK MEDICAL CARE IF:  Your condition is not improving after your transfusion. You develop redness or irritation at the intravenous (IV) site. SEEK IMMEDIATE MEDICAL CARE IF:  Any of the following symptoms occur over the next 12 hours: Shaking chills. You have a temperature by mouth above 102 F (38.9 C), not controlled by medicine. Chest, back, or muscle pain. People around you feel you are not acting correctly or are confused. Shortness of breath or difficulty breathing. Dizziness and fainting. You get a rash or develop hives. You have a decrease in urine output. Your urine turns a dark color or changes to pink, red, or brown. Any of the following symptoms occur over the next 10 days: You have a temperature by mouth above 102 F (38.9 C), not controlled by medicine. Shortness of breath. Weakness after normal activity. The white part of the eye turns yellow (jaundice). You have a decrease in the amount of urine or are urinating less often. Your urine turns a dark color or changes to pink, red, or brown. Document Released: 06/25/2000 Document Revised: 09/20/2011 Document Reviewed: 02/12/2008 [facility] Patient Information 2014 [place], [place].  _______________________________________________________________________

## 2020-12-15 NOTE — Progress Notes (Addendum)
COVID Vaccine Completed:  Yes x3 Date COVID Vaccine completed: 08-23-19, 09-20-19 Has received booster:  07-03-20 COVID vaccine manufacturer: Pfizer    Moderna      Date of COVID positive in last 90 days: N/A  PCP - Raiford Noble, PA-C [facility]) Cardiologist - Cline Cools, MD [facility]  Chest x-ray - N/A EKG - 04-30-20 on chart  Tracing requested Stress Test - pt reports long time ago ECHO - 1--10-22 Epic Cardiac Cath - 2014 Care Everywhere Pacemaker/ICD device last checked: Spinal Cord Stimulator:  Sleep Study - N/A CPAP -   Fasting Blood Sugar - N/A Checks Blood Sugar _____ times a day  Blood Thinner Instructions:  Eliquis.  Last dose 14 days ago Aspirin Instructions: N/A Last Dose:   Activity level:  Can go up a flight of stairs with difficulty due to left hip pain. Denies SOB with exertion. Able to perform ADLs without symptoms of chest pain or SOB.  Anesthesia review:  Afib, HTN, hx of CVA.  Aortic valve replacement 2009. Hx of cardioversion  Patient denies shortness of breath, fever, cough and chest pain at PAT appointment   Patient verbalized understanding of instructions that were given to them at the PAT appointment. Patient was also instructed that they will need to review over the PAT instructions again at home before surgery.

## 2020-12-16 ENCOUNTER — Other Ambulatory Visit: Payer: 59

## 2020-12-16 ENCOUNTER — Other Ambulatory Visit: Payer: Self-pay

## 2020-12-18 ENCOUNTER — Encounter (HOSPITAL_COMMUNITY): Payer: Self-pay

## 2020-12-18 ENCOUNTER — Other Ambulatory Visit (HOSPITAL_COMMUNITY)
Admission: RE | Admit: 2020-12-18 | Discharge: 2020-12-18 | Disposition: A | Discharge status: Home / Self Care | Payer: 59 | Source: Ambulatory Visit | Attending: Orthopaedic Surgery | Admitting: Orthopaedic Surgery

## 2020-12-18 ENCOUNTER — Encounter (HOSPITAL_COMMUNITY)
Admission: RE | Admit: 2020-12-18 | Discharge: 2020-12-18 | Disposition: A | Discharge status: Home / Self Care | Payer: 59 | Source: Ambulatory Visit | Attending: Orthopaedic Surgery | Admitting: Orthopaedic Surgery

## 2020-12-18 ENCOUNTER — Other Ambulatory Visit: Payer: Self-pay

## 2020-12-18 DIAGNOSIS — Z96642 Presence of left artificial hip joint: Secondary | ICD-10-CM | POA: Diagnosis not present

## 2020-12-18 DIAGNOSIS — Z8673 Personal history of transient ischemic attack (TIA), and cerebral infarction without residual deficits: Secondary | ICD-10-CM | POA: Diagnosis not present

## 2020-12-18 DIAGNOSIS — I4892 Unspecified atrial flutter: Secondary | ICD-10-CM | POA: Diagnosis not present

## 2020-12-18 DIAGNOSIS — I10 Essential (primary) hypertension: Secondary | ICD-10-CM | POA: Diagnosis not present

## 2020-12-18 DIAGNOSIS — I480 Paroxysmal atrial fibrillation: Secondary | ICD-10-CM | POA: Diagnosis not present

## 2020-12-18 DIAGNOSIS — K219 Gastro-esophageal reflux disease without esophagitis: Secondary | ICD-10-CM | POA: Diagnosis not present

## 2020-12-18 DIAGNOSIS — Z87442 Personal history of urinary calculi: Secondary | ICD-10-CM | POA: Diagnosis not present

## 2020-12-18 DIAGNOSIS — Z79899 Other long term (current) drug therapy: Secondary | ICD-10-CM | POA: Diagnosis not present

## 2020-12-18 DIAGNOSIS — Z01812 Encounter for preprocedural laboratory examination: Secondary | ICD-10-CM | POA: Insufficient documentation

## 2020-12-18 DIAGNOSIS — Z79890 Hormone replacement therapy (postmenopausal): Secondary | ICD-10-CM | POA: Diagnosis not present

## 2020-12-18 DIAGNOSIS — Z953 Presence of xenogenic heart valve: Secondary | ICD-10-CM | POA: Diagnosis not present

## 2020-12-18 DIAGNOSIS — Z471 Aftercare following joint replacement surgery: Secondary | ICD-10-CM | POA: Diagnosis not present

## 2020-12-18 DIAGNOSIS — M1612 Unilateral primary osteoarthritis, left hip: Secondary | ICD-10-CM | POA: Diagnosis not present

## 2020-12-18 DIAGNOSIS — Z20822 Contact with and (suspected) exposure to covid-19: Secondary | ICD-10-CM | POA: Diagnosis not present

## 2020-12-18 DIAGNOSIS — Z7901 Long term (current) use of anticoagulants: Secondary | ICD-10-CM | POA: Diagnosis not present

## 2020-12-18 DIAGNOSIS — Z85828 Personal history of other malignant neoplasm of skin: Secondary | ICD-10-CM | POA: Diagnosis not present

## 2020-12-18 DIAGNOSIS — D62 Acute posthemorrhagic anemia: Secondary | ICD-10-CM | POA: Diagnosis not present

## 2020-12-18 DIAGNOSIS — I351 Nonrheumatic aortic (valve) insufficiency: Secondary | ICD-10-CM | POA: Diagnosis not present

## 2020-12-18 DIAGNOSIS — E039 Hypothyroidism, unspecified: Secondary | ICD-10-CM | POA: Diagnosis not present

## 2020-12-18 HISTORY — DX: Hypothyroidism, unspecified: E03.9

## 2020-12-18 LAB — BASIC METABOLIC PANEL
Anion gap: 7 (ref 5–15)
BUN: 27 mg/dL — ABNORMAL HIGH (ref 8–23)
CO2: 27 mmol/L (ref 22–32)
Calcium: 9.3 mg/dL (ref 8.9–10.3)
Chloride: 107 mmol/L (ref 98–111)
Creatinine, Ser: 1.21 mg/dL (ref 0.61–1.24)
GFR, Estimated: >60 mL/min (ref >60)
Glucose, Bld: 130 mg/dL — ABNORMAL HIGH (ref 70–99)
Potassium: 4.4 mmol/L (ref 3.5–5.1)
Sodium: 141 mmol/L (ref 135–145)

## 2020-12-18 LAB — CBC
HCT: 45.3 % (ref 39.0–52.0)
Hemoglobin: 15.4 g/dL (ref 13.0–17.0)
MCH: 31.9 pg (ref 26.0–34.0)
MCHC: 34 g/dL (ref 30.0–36.0)
MCV: 93.8 fL (ref 80.0–100.0)
Platelets: 213 10*3/uL (ref 150–400)
RBC: 4.83 MIL/uL (ref 4.22–5.81)
RDW: 14.2 % (ref 11.5–15.5)
WBC: 9.7 10*3/uL (ref 4.0–10.5)
nRBC: 0 % (ref 0.0–0.2)

## 2020-12-18 LAB — SURGICAL PCR SCREEN
MRSA, PCR: NEGATIVE
Staphylococcus aureus: NEGATIVE

## 2020-12-18 LAB — SARS CORONAVIRUS 2 (TAT 6-24 HRS): SARS Coronavirus 2: NEGATIVE

## 2020-12-18 NOTE — Anesthesia Preprocedure Evaluation (Addendum)
Anesthesia Evaluation  Patient identified by MRN, date of birth, ID band Patient awake    Reviewed: Allergy & Precautions, NPO status , Patient's Chart, lab work & pertinent test results, reviewed documented beta blocker date and time   History of Anesthesia Complications Negative for: history of anesthetic complications  Airway Mallampati: II  TM Distance: >3 FB Neck ROM: Full    Dental no notable dental hx.    Pulmonary neg pulmonary ROS,    Pulmonary exam normal        Cardiovascular hypertension, Pt. on home beta blockers and Pt. 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    Neuro/Psych CVA (2011) negative psych ROS   GI/Hepatic Neg liver ROS, GERD  Medicated and Controlled,  Endo/Other  Hypothyroidism   Renal/GU negative Renal ROS  negative genitourinary   Musculoskeletal  (+) Arthritis ,   Abdominal   Peds  Hematology negative hematology ROS (+)   Anesthesia Other Findings Day of surgery medications reviewed with patient.  Reproductive/Obstetrics negative OB ROS                            Anesthesia Physical Anesthesia Plan  ASA: 3  Anesthesia Plan: Spinal   Post-op Pain Management:    Induction:   PONV Risk Score and Plan: 2 and Treatment may vary due to age or medical condition, Ondansetron and Dexamethasone  Airway Management Planned: Natural Airway and Simple Face Mask  Additional Equipment: None  Intra-op Plan:   Post-operative Plan:   Informed Consent: I have reviewed the patients History and Physical, chart, labs and discussed the procedure including the risks, benefits and alternatives for the proposed anesthesia with the patient or authorized representative who has  indicated his/her understanding and acceptance.       Plan Discussed with: CRNA  Anesthesia Plan Comments: (See APP note by Durel Salts, FNP )      Anesthesia Quick Evaluation

## 2020-12-18 NOTE — H&P (Signed)
TOTAL HIP ADMISSION H&P  Patient is admitted for left total hip arthroplasty.  Subjective:  Chief Complaint: left hip pain  HPI: Keith [name]., 80 y.o. male, has a history of pain and functional disability in the left hip(s) due to arthritis and patient has failed non-surgical conservative treatments for greater than 12 weeks to include NSAID's and/or analgesics, flexibility and strengthening excercises, use of assistive devices, and activity modification.  Onset of symptoms was gradual starting 2 years ago with gradually worsening course since that time.The patient noted no past surgery on the left hip(s).  Patient currently rates pain in the left hip at 10 out of 10 with activity. Patient has night pain, worsening of pain with activity and weight bearing, pain that interfers with activities of daily living, and pain with passive range of motion. Patient has evidence of subchondral sclerosis, periarticular osteophytes, and joint space narrowing by imaging studies. This condition presents safety issues increasing the risk of falls.  There is no current active infection.  Patient Active Problem List   Diagnosis Date Noted   Primary osteoarthritis of left hip 12/04/2020   Hyperlipidemia 04/30/2020   Cataract 10/10/2019   Right ureteral stone 10/01/2019   Left ureteral stone 10/01/2019   S/P lumbar fusion 06/15/2019   Acquired hypothyroidism 08/10/2017   Benign prostatic hyperplasia with nocturia 08/10/2017   S/P lumbar laminectomy 06/01/2017   PAF (paroxysmal atrial fibrillation) ([place]) 09/07/2016   S/P AVR 01/18/2014   Hypertension 02/22/2013   Past Medical History:  Diagnosis Date   Arthritis    Atrial fibrillation ([place])    Cardioversion 2016   Atrial flutter ([place])    Cardioversion 2013   Blood transfusion without reported diagnosis    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   Dysrhythmia    GERD (gastroesophageal reflux disease)    History of aortic valve replacement with  bioprosthetic valve    Aortic regurgitation   History of chicken pox    History of kidney stones    History of skin cancer    History of stroke 2011   Hypothyroidism    Kidney stone    Seasonal allergies    Stroke [facility])    after heart surgery 2010    Past Surgical History:  Procedure Laterality Date   AORTIC VALVE REPLACEMENT  2009   Bioprosthetic AVR and root replacement   BACK SURGERY     total of 4 back surgeries   CATARACT EXTRACTION, BILATERAL     COLONOSCOPY     CYSTOSCOPY WITH RETROGRADE PYELOGRAM, URETEROSCOPY AND STENT PLACEMENT Bilateral 10/01/2019   Procedure: CYSTOSCOPY WITH RETROGRADE PYELOGRAM, URETEROSCOPY AND STENT PLACEMENT;  Surgeon: Irine Seal, MD;  Location[facility];  Service: Urology;  Laterality: Bilateral;   EPIGASTRIC HERNIA REPAIR     HERNIA REPAIR     LUMBAR LAMINECTOMY/DECOMPRESSION MICRODISCECTOMY Right 06/01/2017   Procedure: EXTRAFORAMINAL MICRODISCECTOMY LUMBAR FIVE- SACRAL ONE, RIGHT;  Surgeon: Eustace Moore, MD;  Location: [place];  Service: Neurosurgery;  Laterality: Right;   LUMBAR LAMINECTOMY/DECOMPRESSION MICRODISCECTOMY Bilateral 09/04/2018   Procedure: Laminectomy and Foraminotomy - Lumbar two-three bilateral;  Surgeon: Eustace Moore, MD;  Location: [place];  Service: Neurosurgery;  Laterality: Bilateral;   TRANSFORAMINAL LUMBAR INTERBODY FUSION (TLIF) WITH PEDICLE SCREW FIXATION 1 LEVEL Left 06/15/2019   Procedure: Decompression and Instrumention Fusion Lumbar Four- Five;  Surgeon: Eustace Moore, MD;  Location: [place];  Service: Neurosurgery;  Laterality: Left;  Decompression and Instrumention Fusion Lumbar Four- Five  No current facility-administered medications for this encounter.   Current Outpatient Medications  Medication Sig Dispense Refill Last Dose   cyclobenzaprine (FLEXERIL) 5 MG tablet Take 1 tablet (5 mg total) by mouth 3 (three) times daily. (Patient taking differently: Take 5 mg by mouth 3 (three) times daily as needed for muscle  spasms.) 30 tablet 0    ELIQUIS 5 MG TABS tablet Take 5 mg by mouth 2 (two) times daily.      famotidine (PEPCID) 20 MG tablet Take 20 mg by mouth 2 (two) times daily as needed for heartburn or indigestion.      Glucos-MSM-C-Mn-Ginger-Willow (GLUCOSAMINE MSM COMPLEX) TABS Take 1 tablet by mouth at bedtime.       levothyroxine (SYNTHROID) 50 MCG tablet TAKE 1 TABLET BY MOUTH ONCE A DAY BEFORE BREAKFAST (Patient taking differently: Take 50 mcg by mouth daily before breakfast.) 90 tablet 1    metoprolol tartrate (LOPRESSOR) 25 MG tablet Take 25 mg by mouth 2 (two) times daily.      oxyCODONE-acetaminophen (PERCOCET/ROXICET) 5-325 MG tablet Take 1 tablet by mouth every 6 hours as needed (Patient taking differently: Take 1 tablet by mouth every 6 (six) hours as needed for moderate pain.) 40 tablet 0    pantoprazole (PROTONIX) 40 MG tablet TAKE 1 TABLET BY MOUTH ONCE DAILY (Patient taking differently: Take 40 mg by mouth daily.) 30 tablet 3    methylPREDNISolone (MEDROL DOSEPAK) 4 MG TBPK tablet TAKE BY MOUTH AS DIRECTED (Patient not taking: Reported on 12/18/2020) 21 tablet 0 Not Taking   oxyCODONE-acetaminophen (PERCOCET/ROXICET) 5-325 MG tablet Take 1 tablet by mouth every 8 (eight) hours as needed for severe pain. (Patient not taking: Reported on 12/18/2020) 6 tablet 0 Not Taking   oxyCODONE-acetaminophen (PERCOCET/ROXICET) 5-325 MG tablet Take 1 tablet by mouth every 6 (six) hours as needed. (Patient not taking: Reported on 12/18/2020) 40 tablet 0 Not Taking   Allergies  Allergen Reactions   Tape     Social History   Tobacco Use   Smoking status: Never   Smokeless tobacco: Never  Substance Use Topics   Alcohol use: Yes    Comment: occasional    Family History  Problem Relation Age of Onset   Colon cancer Neg Hx    Esophageal cancer Neg Hx    Rectal cancer Neg Hx    Stomach cancer Neg Hx      Review of Systems  Musculoskeletal:  Positive for back pain and gait problem.  All other systems  reviewed and are negative.  Objective:  Physical Exam Vitals reviewed.  Constitutional:      Appearance: Normal appearance.  HENT:     Head: Normocephalic and atraumatic.  Eyes:     Extraocular Movements: Extraocular movements intact.     Pupils: Pupils are equal, round, and reactive to light.  Cardiovascular:     Rate and Rhythm: Normal rate.     Pulses: Normal pulses.  Pulmonary:     Effort: Pulmonary effort is normal.     Breath sounds: Normal breath sounds.  Abdominal:     Palpations: Abdomen is soft.  Musculoskeletal:     Cervical back: Normal range of motion and neck supple.     Left hip: Tenderness and bony tenderness present. Decreased range of motion. Decreased strength.  Skin:    General: Skin is dry.  Neurological:     Mental Status: He is alert and oriented to person, place, and time.  Psychiatric:  Behavior: Behavior normal.    Vital signs in last 24 hours: Temp:  [98.2 F (36.8 C)] 98.2 F (36.8 C) (06/09 0756) Pulse Rate:  [64] 64 (06/09 0756) Resp:  [20] 20 (06/09 0756) BP: (130)/(87) 130/87 (06/09 0756) SpO2:  [99 %] 99 % (06/09 0756) Weight:  [78.2 kg] 78.2 kg (06/09 0756)  Labs:   Estimated body mass index is 24.04 kg/m as calculated from the following:   Height as of 12/18/20: 5\' 11"  (1.803 m).   Weight as of 12/18/20: 78.2 kg.   Imaging Review Plain radiographs demonstrate moderate degenerative joint disease of the left hip(s). The bone quality appears to be good for age and reported activity level.      Assessment/Plan:  End stage arthritis, left hip(s)  The patient history, physical examination, clinical judgement of the provider and imaging studies are consistent with end stage degenerative joint disease of the left hip(s) and total hip arthroplasty is deemed medically necessary. The treatment options including medical management, injection therapy, arthroscopy and arthroplasty were discussed at length. The risks and benefits of  total hip arthroplasty were presented and reviewed. The risks due to aseptic loosening, infection, stiffness, dislocation/subluxation,  thromboembolic complications and other imponderables were discussed.  The patient acknowledged the explanation, agreed to proceed with the plan and consent was signed. Patient is being admitted for inpatient treatment for surgery, pain control, PT, OT, prophylactic antibiotics, VTE prophylaxis, progressive ambulation and ADL's and discharge planning.The patient is planning to be discharged home with home health services

## 2020-12-18 NOTE — Progress Notes (Signed)
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 Last office visit 04/30/20 (notes in care everywhere)    LABS: Labs reviewed: Acceptable for surgery. (all labs ordered are listed, but only abnormal results are displayed)  Labs Reviewed  BASIC METABOLIC PANEL - Abnormal; Notable for the following components:      Result Value   Glucose, Bld 130 (*)    BUN 27 (*)    All other components within normal limits  SURGICAL PCR SCREEN  CBC  TYPE AND SCREEN    EKG 04/30/20: report in care everywhere documents: Atrial fibrillation, rate controlled - tracing requested   CV: Echo 07/21/20:  1. Left ventricular ejection fraction, by estimation, is 60 to 65%. The left ventricle has normal function. The left ventricle has no regional wall motion abnormalities. There is mild concentric left ventricular hypertrophy. Left ventricular diastolic parameters are indeterminate.  2. Right ventricular systolic function is moderately reduced. The right ventricular size is normal.  3. Left atrial size was severely dilated.  4. The mitral valve is normal in structure. Trivial mitral valve regurgitation.  5. The aortic valve has been repaired/replaced. Aortic valve  regurgitation is not visualized. No  aortic stenosis is present. There is a 27 mm Magna valve present in the aortic position. Procedure Date: 2009.  6. Aortic dilatation noted. There is mild dilatation of the aortic root, measuring 43 mm.  7. The inferior vena cava is normal in size with greater than 50% respiratory variability, suggesting right atrial pressure of 3 mmHg.   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   Dysrhythmia    GERD (gastroesophageal reflux disease)    History of aortic valve replacement with bioprosthetic valve    Aortic regurgitation   History of chicken pox    History of kidney stones    History of skin cancer    History of stroke 2011   Hypothyroidism    Kidney stone    Seasonal allergies    Stroke [facility])    after heart surgery 2010    Past Surgical History:  Procedure Laterality Date   AORTIC VALVE REPLACEMENT  2009   Bioprosthetic AVR and root replacement   BACK SURGERY     total of 4 back surgeries   CATARACT EXTRACTION, BILATERAL  COLONOSCOPY     CYSTOSCOPY WITH RETROGRADE PYELOGRAM, URETEROSCOPY AND STENT PLACEMENT Bilateral 10/01/2019   Procedure: CYSTOSCOPY WITH RETROGRADE PYELOGRAM, URETEROSCOPY AND STENT PLACEMENT;  Surgeon: Irine Seal, MD;  Location[facility];  Service: Urology;  Laterality: Bilateral;   EPIGASTRIC HERNIA REPAIR     HERNIA REPAIR     LUMBAR LAMINECTOMY/DECOMPRESSION MICRODISCECTOMY Right 06/01/2017   Procedure: EXTRAFORAMINAL MICRODISCECTOMY LUMBAR FIVE- SACRAL ONE, RIGHT;  Surgeon: Eustace Moore, MD;  Location: [place];  Service: Neurosurgery;  Laterality: Right;   LUMBAR LAMINECTOMY/DECOMPRESSION MICRODISCECTOMY Bilateral 09/04/2018   Procedure:  Laminectomy and Foraminotomy - Lumbar two-three bilateral;  Surgeon: Eustace Moore, MD;  Location: [place];  Service: Neurosurgery;  Laterality: Bilateral;   TRANSFORAMINAL LUMBAR INTERBODY FUSION (TLIF) WITH PEDICLE SCREW FIXATION 1 LEVEL Left 06/15/2019   Procedure: Decompression and Instrumention Fusion Lumbar Four- Five;  Surgeon: Eustace Moore, MD;  Location: [place];  Service: Neurosurgery;  Laterality: Left;  Decompression and Instrumention Fusion Lumbar Four- Five    MEDICATIONS:  cyclobenzaprine (FLEXERIL) 5 MG tablet   ELIQUIS 5 MG TABS tablet   famotidine (PEPCID) 20 MG tablet   Glucos-MSM-C-Mn-Ginger-Willow (GLUCOSAMINE MSM COMPLEX) TABS   levothyroxine (SYNTHROID) 50 MCG tablet   methylPREDNISolone (MEDROL DOSEPAK) 4 MG TBPK tablet   metoprolol tartrate (LOPRESSOR) 25 MG tablet   oxyCODONE-acetaminophen (PERCOCET/ROXICET) 5-325 MG tablet   oxyCODONE-acetaminophen (PERCOCET/ROXICET) 5-325 MG tablet   oxyCODONE-acetaminophen (PERCOCET/ROXICET) 5-325 MG tablet   pantoprazole (PROTONIX) 40 MG tablet   No current facility-administered medications for this encounter.   - Pt stopped his eliquis 14 days ago   If no changes, I anticipate pt can proceed with surgery as scheduled.   Willeen Cass, PhD, FNP-BC [facility]/Anesthesiology Phone: [phone] 12/18/2020 12:59 PM

## 2020-12-19 ENCOUNTER — Inpatient Hospital Stay (HOSPITAL_COMMUNITY)
Admission: RE | Admit: 2020-12-19 | Discharge: 2020-12-21 | DRG: 470 | Disposition: A | Discharge status: Home Health | Payer: 59 | Attending: Orthopaedic Surgery | Admitting: Orthopaedic Surgery

## 2020-12-19 ENCOUNTER — Ambulatory Visit (HOSPITAL_COMMUNITY): Payer: 59 | Admitting: Anesthesiology

## 2020-12-19 ENCOUNTER — Ambulatory Visit (HOSPITAL_COMMUNITY): Payer: 59 | Admitting: Emergency Medicine

## 2020-12-19 ENCOUNTER — Ambulatory Visit (HOSPITAL_COMMUNITY): Payer: 59

## 2020-12-19 ENCOUNTER — Observation Stay (HOSPITAL_COMMUNITY): Payer: 59

## 2020-12-19 ENCOUNTER — Encounter (HOSPITAL_COMMUNITY)
Admission: RE | Disposition: A | Discharge status: Home / Self Care | Payer: Self-pay | Source: Home / Self Care | Attending: Orthopaedic Surgery

## 2020-12-19 ENCOUNTER — Encounter (HOSPITAL_COMMUNITY): Payer: Self-pay | Admitting: Orthopaedic Surgery

## 2020-12-19 DIAGNOSIS — D62 Acute posthemorrhagic anemia: Secondary | ICD-10-CM | POA: Diagnosis not present

## 2020-12-19 DIAGNOSIS — K219 Gastro-esophageal reflux disease without esophagitis: Secondary | ICD-10-CM | POA: Diagnosis present

## 2020-12-19 DIAGNOSIS — Z8673 Personal history of transient ischemic attack (TIA), and cerebral infarction without residual deficits: Secondary | ICD-10-CM

## 2020-12-19 DIAGNOSIS — Z20822 Contact with and (suspected) exposure to covid-19: Secondary | ICD-10-CM | POA: Diagnosis present

## 2020-12-19 DIAGNOSIS — S82122A Displaced fracture of lateral condyle of left tibia, initial encounter for closed fracture: Secondary | ICD-10-CM

## 2020-12-19 DIAGNOSIS — I10 Essential (primary) hypertension: Secondary | ICD-10-CM | POA: Diagnosis present

## 2020-12-19 DIAGNOSIS — E039 Hypothyroidism, unspecified: Secondary | ICD-10-CM | POA: Diagnosis not present

## 2020-12-19 DIAGNOSIS — Z79899 Other long term (current) drug therapy: Secondary | ICD-10-CM

## 2020-12-19 DIAGNOSIS — Z96642 Presence of left artificial hip joint: Secondary | ICD-10-CM

## 2020-12-19 DIAGNOSIS — I480 Paroxysmal atrial fibrillation: Secondary | ICD-10-CM | POA: Diagnosis present

## 2020-12-19 DIAGNOSIS — Z85828 Personal history of other malignant neoplasm of skin: Secondary | ICD-10-CM

## 2020-12-19 DIAGNOSIS — Z79890 Hormone replacement therapy (postmenopausal): Secondary | ICD-10-CM

## 2020-12-19 DIAGNOSIS — Z87442 Personal history of urinary calculi: Secondary | ICD-10-CM

## 2020-12-19 DIAGNOSIS — I351 Nonrheumatic aortic (valve) insufficiency: Secondary | ICD-10-CM | POA: Diagnosis present

## 2020-12-19 DIAGNOSIS — M1612 Unilateral primary osteoarthritis, left hip: Principal | ICD-10-CM | POA: Diagnosis present

## 2020-12-19 DIAGNOSIS — Z7901 Long term (current) use of anticoagulants: Secondary | ICD-10-CM

## 2020-12-19 DIAGNOSIS — Z471 Aftercare following joint replacement surgery: Secondary | ICD-10-CM | POA: Diagnosis not present

## 2020-12-19 DIAGNOSIS — Z953 Presence of xenogenic heart valve: Secondary | ICD-10-CM

## 2020-12-19 DIAGNOSIS — I4892 Unspecified atrial flutter: Secondary | ICD-10-CM | POA: Diagnosis present

## 2020-12-19 HISTORY — PX: TOTAL HIP ARTHROPLASTY: SHX124

## 2020-12-19 LAB — TYPE AND SCREEN
ABO/RH(D): O POS
Antibody Screen: NEGATIVE

## 2020-12-19 SURGERY — ARTHROPLASTY, HIP, TOTAL, ANTERIOR APPROACH
Anesthesia: Spinal | Site: Hip | Laterality: Left

## 2020-12-19 MED ORDER — SODIUM CHLORIDE 0.9 % IR SOLN
Status: DC | PRN
  Administered 2020-12-19: 1000 mL

## 2020-12-19 MED ORDER — OXYCODONE HCL 5 MG PO TABS
10.0000 mg | ORAL_TABLET | ORAL | Status: DC | PRN
  Administered 2020-12-21: 10 mg via ORAL
  Filled 2020-12-19: qty 2

## 2020-12-19 MED ORDER — MEPIVACAINE HCL (PF) 2 % IJ SOLN
INTRAMUSCULAR | Status: DC | PRN
  Administered 2020-12-19: 70 mg via INTRATHECAL

## 2020-12-19 MED ORDER — STERILE WATER FOR IRRIGATION IR SOLN
Status: DC | PRN
  Administered 2020-12-19: 2000 mL

## 2020-12-19 MED ORDER — OXYCODONE HCL 5 MG PO TABS
5.0000 mg | ORAL_TABLET | ORAL | Status: DC | PRN
  Administered 2020-12-19: 5 mg via ORAL
  Administered 2020-12-19 – 2020-12-20 (×5): 10 mg via ORAL
  Filled 2020-12-19 (×3): qty 2
  Filled 2020-12-19: qty 1
  Filled 2020-12-19 (×3): qty 2

## 2020-12-19 MED ORDER — PANTOPRAZOLE SODIUM 40 MG PO TBEC
40.0000 mg | DELAYED_RELEASE_TABLET | Freq: Every day | ORAL | Status: DC
  Administered 2020-12-20 – 2020-12-21 (×2): 40 mg via ORAL
  Filled 2020-12-19 (×2): qty 1

## 2020-12-19 MED ORDER — METOCLOPRAMIDE HCL 5 MG PO TABS
5.0000 mg | ORAL_TABLET | Freq: Three times a day (TID) | ORAL | Status: DC | PRN
Start: 2020-12-19 — End: 2020-12-21

## 2020-12-19 MED ORDER — CYCLOBENZAPRINE HCL 5 MG PO TABS: 5.0000 mg | ORAL_TABLET | Freq: Three times a day (TID) | ORAL | Status: DC | PRN

## 2020-12-19 MED ORDER — METHOCARBAMOL 500 MG IVPB - SIMPLE MED
INTRAVENOUS | Status: AC
  Filled 2020-12-19: qty 50

## 2020-12-19 MED ORDER — ONDANSETRON HCL 4 MG/2ML IJ SOLN
INTRAMUSCULAR | Status: DC | PRN
  Administered 2020-12-19: 4 mg via INTRAVENOUS

## 2020-12-19 MED ORDER — PROPOFOL 1000 MG/100ML IV EMUL
INTRAVENOUS | Status: AC
  Filled 2020-12-19: qty 100

## 2020-12-19 MED ORDER — METOCLOPRAMIDE HCL 5 MG/ML IJ SOLN: 5.0000 mg | Freq: Three times a day (TID) | INTRAMUSCULAR | Status: DC | PRN

## 2020-12-19 MED ORDER — ONDANSETRON HCL 4 MG PO TABS
4.0000 mg | ORAL_TABLET | Freq: Four times a day (QID) | ORAL | Status: DC | PRN
  Administered 2020-12-20 – 2020-12-21 (×3): 4 mg via ORAL
  Filled 2020-12-19 (×3): qty 1

## 2020-12-19 MED ORDER — DOCUSATE SODIUM 100 MG PO CAPS
100.0000 mg | ORAL_CAPSULE | Freq: Two times a day (BID) | ORAL | Status: DC
  Administered 2020-12-19 – 2020-12-21 (×4): 100 mg via ORAL
  Filled 2020-12-19 (×4): qty 1

## 2020-12-19 MED ORDER — METHOCARBAMOL 500 MG IVPB - SIMPLE MED
500.0000 mg | Freq: Four times a day (QID) | INTRAVENOUS | Status: DC | PRN
  Administered 2020-12-19: 500 mg via INTRAVENOUS
  Filled 2020-12-19: qty 50

## 2020-12-19 MED ORDER — OXYCODONE HCL 5 MG PO TABS
5.0000 mg | ORAL_TABLET | Freq: Once | ORAL | Status: AC | PRN
  Administered 2020-12-19: 5 mg via ORAL

## 2020-12-19 MED ORDER — ALBUMIN HUMAN 5 % IV SOLN: INTRAVENOUS | Status: DC | PRN

## 2020-12-19 MED ORDER — FENTANYL CITRATE (PF) 100 MCG/2ML IJ SOLN
INTRAMUSCULAR | Status: AC
  Filled 2020-12-19: qty 2

## 2020-12-19 MED ORDER — FENTANYL CITRATE (PF) 100 MCG/2ML IJ SOLN
INTRAMUSCULAR | Status: AC
  Administered 2020-12-19: 25 ug via INTRAVENOUS
  Filled 2020-12-19: qty 2

## 2020-12-19 MED ORDER — 0.9 % SODIUM CHLORIDE (POUR BTL) OPTIME
TOPICAL | Status: DC | PRN
  Administered 2020-12-19: 1000 mL

## 2020-12-19 MED ORDER — SODIUM CHLORIDE 0.9 % IV SOLN: INTRAVENOUS | Status: DC

## 2020-12-19 MED ORDER — ORAL CARE MOUTH RINSE: 15.0000 mL | Freq: Once | OROMUCOSAL | Status: AC

## 2020-12-19 MED ORDER — PROMETHAZINE HCL 25 MG/ML IJ SOLN: 6.2500 mg | INTRAMUSCULAR | Status: DC | PRN

## 2020-12-19 MED ORDER — DIPHENHYDRAMINE HCL 12.5 MG/5ML PO ELIX: 12.5000 mg | ORAL_SOLUTION | ORAL | Status: DC | PRN

## 2020-12-19 MED ORDER — PHENYLEPHRINE HCL-NACL 10-0.9 MG/250ML-% IV SOLN
INTRAVENOUS | Status: DC | PRN
  Administered 2020-12-19: 40 ug/min via INTRAVENOUS

## 2020-12-19 MED ORDER — PHENYLEPHRINE HCL-NACL 10-0.9 MG/250ML-% IV SOLN
INTRAVENOUS | Status: AC
  Filled 2020-12-19: qty 500

## 2020-12-19 MED ORDER — METHOCARBAMOL 500 MG PO TABS
500.0000 mg | ORAL_TABLET | Freq: Four times a day (QID) | ORAL | Status: DC | PRN
  Administered 2020-12-19 – 2020-12-20 (×4): 500 mg via ORAL
  Filled 2020-12-19 (×4): qty 1

## 2020-12-19 MED ORDER — PROPOFOL 10 MG/ML IV BOLUS
INTRAVENOUS | Status: DC | PRN
  Administered 2020-12-19: 30 mg via INTRAVENOUS
  Administered 2020-12-19: 20 mg via INTRAVENOUS

## 2020-12-19 MED ORDER — PROPOFOL 500 MG/50ML IV EMUL
INTRAVENOUS | Status: DC | PRN
  Administered 2020-12-19: 100 ug/kg/min via INTRAVENOUS

## 2020-12-19 MED ORDER — ALUM & MAG HYDROXIDE-SIMETH 200-200-20 MG/5ML PO SUSP: 30.0000 mL | ORAL | Status: DC | PRN

## 2020-12-19 MED ORDER — CEFAZOLIN SODIUM-DEXTROSE 1-4 GM/50ML-% IV SOLN
1.0000 g | Freq: Four times a day (QID) | INTRAVENOUS | Status: AC
  Administered 2020-12-19 (×2): 1 g via INTRAVENOUS
  Filled 2020-12-19 (×2): qty 50

## 2020-12-19 MED ORDER — ACETAMINOPHEN 500 MG PO TABS
1000.0000 mg | ORAL_TABLET | Freq: Once | ORAL | Status: AC
  Administered 2020-12-19: 1000 mg via ORAL
  Filled 2020-12-19: qty 2

## 2020-12-19 MED ORDER — CEFAZOLIN SODIUM-DEXTROSE 2-4 GM/100ML-% IV SOLN
2.0000 g | INTRAVENOUS | Status: AC
  Administered 2020-12-19: 2 g via INTRAVENOUS
  Filled 2020-12-19: qty 100

## 2020-12-19 MED ORDER — EPHEDRINE SULFATE-NACL 50-0.9 MG/10ML-% IV SOSY
PREFILLED_SYRINGE | INTRAVENOUS | Status: DC | PRN
  Administered 2020-12-19 (×2): 10 mg via INTRAVENOUS

## 2020-12-19 MED ORDER — ONDANSETRON HCL 4 MG/2ML IJ SOLN: 4.0000 mg | Freq: Four times a day (QID) | INTRAMUSCULAR | Status: DC | PRN

## 2020-12-19 MED ORDER — MENTHOL 3 MG MT LOZG: 1.0000 | LOZENGE | OROMUCOSAL | Status: DC | PRN

## 2020-12-19 MED ORDER — METOPROLOL TARTRATE 25 MG PO TABS
25.0000 mg | ORAL_TABLET | Freq: Two times a day (BID) | ORAL | Status: DC
  Administered 2020-12-19 – 2020-12-21 (×4): 25 mg via ORAL
  Filled 2020-12-19 (×4): qty 1

## 2020-12-19 MED ORDER — OXYCODONE HCL 5 MG PO TABS
ORAL_TABLET | ORAL | Status: AC
  Filled 2020-12-19: qty 1

## 2020-12-19 MED ORDER — HYDROMORPHONE HCL 1 MG/ML IJ SOLN
0.5000 mg | INTRAMUSCULAR | Status: DC | PRN
  Administered 2020-12-19 (×2): 1 mg via INTRAVENOUS
  Filled 2020-12-19 (×2): qty 1

## 2020-12-19 MED ORDER — CHLORHEXIDINE GLUCONATE 0.12 % MT SOLN
15.0000 mL | Freq: Once | OROMUCOSAL | Status: AC
  Administered 2020-12-19: 15 mL via OROMUCOSAL

## 2020-12-19 MED ORDER — APIXABAN 5 MG PO TABS
5.0000 mg | ORAL_TABLET | Freq: Two times a day (BID) | ORAL | Status: DC
  Administered 2020-12-20 – 2020-12-21 (×3): 5 mg via ORAL
  Filled 2020-12-19 (×3): qty 1

## 2020-12-19 MED ORDER — ACETAMINOPHEN 325 MG PO TABS
325.0000 mg | ORAL_TABLET | Freq: Four times a day (QID) | ORAL | Status: DC | PRN
  Administered 2020-12-19 – 2020-12-21 (×5): 650 mg via ORAL
  Filled 2020-12-19 (×6): qty 2

## 2020-12-19 MED ORDER — OXYCODONE HCL 5 MG/5ML PO SOLN: 5.0000 mg | Freq: Once | ORAL | Status: AC | PRN

## 2020-12-19 MED ORDER — TRANEXAMIC ACID-NACL 1000-0.7 MG/100ML-% IV SOLN
1000.0000 mg | INTRAVENOUS | Status: AC
  Administered 2020-12-19: 1000 mg via INTRAVENOUS
  Filled 2020-12-19: qty 100

## 2020-12-19 MED ORDER — PHENOL 1.4 % MT LIQD: 1.0000 | OROMUCOSAL | Status: DC | PRN

## 2020-12-19 MED ORDER — FENTANYL CITRATE (PF) 100 MCG/2ML IJ SOLN
25.0000 ug | INTRAMUSCULAR | Status: DC | PRN
  Administered 2020-12-19: 50 ug via INTRAVENOUS
  Administered 2020-12-19: 25 ug via INTRAVENOUS
  Administered 2020-12-19: 50 ug via INTRAVENOUS

## 2020-12-19 MED ORDER — LEVOTHYROXINE SODIUM 50 MCG PO TABS
50.0000 ug | ORAL_TABLET | Freq: Every day | ORAL | Status: DC
  Administered 2020-12-20 – 2020-12-21 (×2): 50 ug via ORAL
  Filled 2020-12-19 (×2): qty 1

## 2020-12-19 MED ORDER — POVIDONE-IODINE 10 % EX SWAB
2.0000 "application " | Freq: Once | CUTANEOUS | Status: AC
  Administered 2020-12-19: 2 via TOPICAL

## 2020-12-19 MED ORDER — FAMOTIDINE 20 MG PO TABS
20.0000 mg | ORAL_TABLET | Freq: Two times a day (BID) | ORAL | Status: DC | PRN
  Administered 2020-12-21: 20 mg via ORAL
  Filled 2020-12-19: qty 1

## 2020-12-19 MED ORDER — LACTATED RINGERS IV SOLN
INTRAVENOUS | Status: DC
  Administered 2020-12-19: 1000 mL via INTRAVENOUS

## 2020-12-19 SURGICAL SUPPLY — 40 items
ACETAB CUP W/GRIPTION 54 (Plate) ×2 IMPLANT
BAG ZIPLOCK 12X15 (MISCELLANEOUS) IMPLANT
BENZOIN TINCTURE PRP APPL 2/3 (GAUZE/BANDAGES/DRESSINGS) IMPLANT
BLADE SAW SGTL 18X1.27X75 (BLADE) ×2 IMPLANT
COVER PERINEAL POST (MISCELLANEOUS) ×2 IMPLANT
COVER SURGICAL LIGHT HANDLE (MISCELLANEOUS) ×2 IMPLANT
COVER WAND RF STERILE (DRAPES) ×2 IMPLANT
CUP ACETAB W/GRIPTION 54 (Plate) ×1 IMPLANT
DRAPE FOOT SWITCH (DRAPES) ×2 IMPLANT
DRAPE STERI IOBAN 125X83 (DRAPES) ×2 IMPLANT
DRAPE U-SHAPE 47X51 STRL (DRAPES) ×4 IMPLANT
DRSG AQUACEL AG ADV 3.5X10 (GAUZE/BANDAGES/DRESSINGS) ×2 IMPLANT
DURAPREP 26ML APPLICATOR (WOUND CARE) ×2 IMPLANT
ELECT REM PT RETURN 15FT ADLT (MISCELLANEOUS) ×2 IMPLANT
GAUZE XEROFORM 1X8 LF (GAUZE/BANDAGES/DRESSINGS) ×2 IMPLANT
GLOVE SRG 8 PF TXTR STRL LF DI (GLOVE) ×2 IMPLANT
GLOVE SURG ENC MOIS LTX SZ7.5 (GLOVE) ×2 IMPLANT
GLOVE SURG LTX SZ8 (GLOVE) ×2 IMPLANT
GLOVE SURG UNDER POLY LF SZ8 (GLOVE) ×2
GOWN STRL REUS W/TWL XL LVL3 (GOWN DISPOSABLE) ×4 IMPLANT
HANDPIECE INTERPULSE COAX TIP (DISPOSABLE) ×1
HEAD M SROM 36MM PLUS 1.5 (Hips) ×1 IMPLANT
HOLDER FOLEY CATH W/STRAP (MISCELLANEOUS) ×2 IMPLANT
KIT TURNOVER KIT A (KITS) ×2 IMPLANT
LINER NEUTRAL 36ID 54OD (Liner) ×2 IMPLANT
PACK ANTERIOR HIP CUSTOM (KITS) ×2 IMPLANT
PENCIL SMOKE EVACUATOR (MISCELLANEOUS) IMPLANT
SET HNDPC FAN SPRY TIP SCT (DISPOSABLE) ×1 IMPLANT
SROM M HEAD 36MM PLUS 1.5 (Hips) ×2 IMPLANT
STAPLER VISISTAT 35W (STAPLE) ×2 IMPLANT
STEM AMT HIGH CORAIL SZ13X135 (Stem) ×2 IMPLANT
STRIP CLOSURE SKIN 1/2X4 (GAUZE/BANDAGES/DRESSINGS) IMPLANT
SUT ETHIBOND NAB CT1 #1 30IN (SUTURE) ×4 IMPLANT
SUT ETHILON 2 0 PS N (SUTURE) IMPLANT
SUT MNCRL AB 4-0 PS2 18 (SUTURE) IMPLANT
SUT VIC AB 0 CT1 36 (SUTURE) ×2 IMPLANT
SUT VIC AB 1 CT1 36 (SUTURE) ×2 IMPLANT
SUT VIC AB 2-0 CT1 27 (SUTURE) ×2
SUT VIC AB 2-0 CT1 TAPERPNT 27 (SUTURE) ×2 IMPLANT
TRAY FOLEY MTR SLVR 16FR STAT (SET/KITS/TRAYS/PACK) IMPLANT

## 2020-12-19 NOTE — Brief Op Note (Signed)
12/19/2020  9:46 AM  PATIENT:  Keith [name].  80 y.o. male  PRE-OPERATIVE DIAGNOSIS:  left hip osteoarthritis  POST-OPERATIVE DIAGNOSIS:  left hip osteoarthritis  PROCEDURE:  Procedure(s): LEFT TOTAL HIP ARTHROPLASTY ANTERIOR APPROACH (Left)  SURGEON:  Surgeon(s) and Role:    Mcarthur Rossetti, MD - Primary  PHYSICIAN ASSISTANT: Benita Stabile, PA-C  ANESTHESIA:   spinal  EBL:  250 mL   COUNTS:  YES  TOURNIQUET:  * No tourniquets in log *  DICTATION: .Other Dictation: Dictation Number [id number]  PLAN OF CARE: Admit for overnight observation  PATIENT DISPOSITION:  PACU - hemodynamically stable.   Delay start of Pharmacological VTE agent (>24hrs) due to surgical blood loss or risk of bleeding: no

## 2020-12-19 NOTE — Op Note (Signed)
NAME: Keith [name], Keith [name] MEDICAL RECORD NO: [id number] ACCOUNT NO: [id number] DATE OF BIRTH: [birth year] FACILITY: [facility] LOCATION: [facility] PHYSICIAN: Lind Guest. Ninfa Linden, MD  Operative Report   DATE OF PROCEDURE: 12/19/2020  PREOPERATIVE DIAGNOSES:  Primary osteoarthritis and degenerative joint disease, left hip.  POSTOPERATIVE DIAGNOSES:  Primary osteoarthritis and degenerative joint disease, left hip.  PROCEDURE:  Left total hip arthroplasty through direct anterior approach.  IMPLANTS:  DePuy Sector Gription acetabular component size 54, size 36+0 neutral polyethylene liner, size 13 Corail femoral component with high offset, size 36+1.5 metal hip ball.  SURGEON:  Lind Guest. Ninfa Linden, MD  ASSISTANT:  Erskine Emery, PA-C  ANESTHESIA:  Spinal.  ANTIBIOTICS:  2 grams IV Ancef.  BLOOD LOSS:  250 mL.  COMPLICATIONS:  None.  INDICATIONS:  The patient is an 80 year old gentleman who actually has moderate arthritis of both his hips.  This is confirmed with MRI and clinical exam as well as plain films.  His left hip hurts more significantly that his right hip.  At this point,  with the failure of conservative treatment through all modalities, he does wish to proceed with total hip arthroplasty on the left side.  He actually has a leg length discrepancy with the left side a little longer than the right and he knows we will not  be shortening him for stability purposes and if he does have his right hip addressed at some point, we would be able to better maximize and equalize his leg length.  We had a long and thorough discussion about the surgery.  I talked in detail about the  risk of acute blood loss anemia, nerve and vessel injury, fracture, infection, DVT, dislocation, implant failure and skin and soft tissue issues.  We talked about our goals being hopefully decrease pain, improve mobility and overall improve quality of  life.  DESCRIPTION OF PROCEDURE:  After informed consent was  obtained, appropriate left hip was marked.  He was brought to the operating room and sat up on the stretcher where spinal anesthesia was obtained.  He was laid in the supine position on the stretcher.   A Foley catheter was placed. Again assessed his leg length, he is definitely longer on his left operative side.  Traction boots were placed on both his feet.  Next, he was placed supine on the Hana fracture table, the perineal post in place and both  legs in line skeletal traction devices and no traction applied.  His left hip was prepped and draped with DuraPrep and sterile drapes.  A timeout was called and he was identified correct patient, correct left hip.  I then made an incision just inferior  and posterior to the anterior superior iliac spine and carried this obliquely down the leg.  We dissected down tensor fascia lata muscle and the tensor fascia was then divided longitudinally to proceed with direct anterior approach to the hip.  We  identified and cauterized circumflex vessels, identified the hip capsule, opened up the hip capsule in L-type format, finding a moderate joint effusion.  We placed Cobra retractors within the joint capsule around the medial and lateral femoral neck and  made a femoral neck cut with an oscillating saw and completed this with an osteotome.  This cut was proximal to the lesser trochanter.  I then removed the femoral head in its entirety and found an area that had significant cartilage loss.  I then placed  a bent Hohmann over the medial acetabular rim and removed remnants of the  acetabular labrum and other debris.  We then began reaming after we removed remnants of the acetabular labrum from a size 43 reamer in stepwise increments up to a size 53, with all  reamers placed under direct visualization and the last reamer placed under direct fluoroscopy, so we could obtain our depth of reaming, our inclination and anteversion.  I then placed the real DePuy Sector Gription  acetabular component size 54 and a  36+0 neutral polyethylene liner.  Once we got the acetabulum in and liner, attention was turned to the femur, with the leg externally rotated to 120 degrees, extended and adducted, we were able to place a Mueller retractor medially and Hohmann retractor  behind the greater trochanter, we released the lateral joint capsule and used a box cutting osteotome to enter femoral canal and a rongeur to lateralize.  We then began broaching using the Corail broaching system from a size 8 going up to size 13.  With  the size 13 in place, we trialed a standard offset femoral neck and a 36-2 hip ball just to get an idea of what his leg length and stability would be like.  This was easily reduced into the acetabulum, and we dislocated it easy.  Based on radiographic  evidence too, I felt like we needed more offset from medializing the cup and going with just a 0 liner.  We dislocated the hip, removed the trial components.  I placed the real Corail femoral component size 13 but we went with a high offset femoral neck  and then we went with a 36+1.5 hip ball, which was a metal hip ball, reduced this in the acetabulum and I was definitely pleased with stability.  We had equaled his leg length and his offset as well.  Again, I put him through significant range of motion  under direct visualization and fluoroscopy and with that mechanical assessment as well, felt stable.  We then irrigated the soft tissue with normal saline solution using pulsatile lavage.  We closed the joint capsule with interrupted #1 Ethibond suture,  followed by #1 Vicryl to close the tensor fascia, 0 Vicryl was used to close the deep tissue and 2-0 Vicryl was used to close subcutaneous tissue.  The skin was reapproximated with staples.  An Aquacel dressing was applied.  He was taken off the Hana  table, taken to recovery room in stable condition with all final counts being correct and no complications noted.  Of note,  Benita Stabile, PA-C, assisted during the entire case and assistance was crucial for facilitating all aspects of this case.   SHW D: 12/19/2020 9:43:37 am T: 12/19/2020 10:46:00 am  JOB: [id number]/ [id number]

## 2020-12-19 NOTE — Interval H&P Note (Signed)
History and Physical Interval Note: The patient understands that he is here today for a left total hip replacement to treat his left hip osteoarthritis.  There has been no acute or interval change in his medical status.  See recent H&P.  The the risks and benefits of surgery have been explained in detail and informed consent is obtained.  The left hip has been marked.  12/19/2020 7:07 AM  [name].  has presented today for surgery, with the diagnosis of left hip osteoarthritis.  The various methods of treatment have been discussed with the patient and family. After consideration of risks, benefits and other options for treatment, the patient has consented to  Procedure(s): LEFT TOTAL HIP ARTHROPLASTY ANTERIOR APPROACH (Left) as a surgical intervention.  The patient's history has been reviewed, patient examined, no change in status, stable for surgery.  I have reviewed the patient's chart and labs.  Questions were answered to the patient's satisfaction.     Mcarthur Rossetti

## 2020-12-19 NOTE — Anesthesia Procedure Notes (Signed)
Spinal  Patient location during procedure: OR Start time: 12/19/2020 8:29 AM End time: 12/19/2020 8:35 AM Reason for block: surgical anesthesia Staffing Performed: anesthesiologist  Anesthesiologist: Brennan Bailey, MD Preanesthetic Checklist Completed: patient identified, IV checked, risks and benefits discussed, surgical consent, monitors and equipment checked, pre-op evaluation and timeout performed Spinal Block Patient position: sitting Prep: DuraPrep and site prepped and draped Patient monitoring: continuous pulse ox, blood pressure and heart rate Approach: right paramedian Location: L2-3 Injection technique: single-shot Needle Needle type: [place]  Needle gauge: 22 G Needle length: 9 cm Assessment Events: CSF return Additional Notes Risks, benefits, and alternative discussed. Patient gave consent to procedure. Prepped and draped in sitting position. Patient sedated but responsive to voice. Difficult placement due to prior lumbar surgery. 3 attempts required (first midline at L3-4, second right paramedian at L3-4, third right paramedian L2-3). Clear CSF obtained. Positive terminal aspiration. No pain or paraesthesias with injection. Patient tolerated procedure well. Vital signs stable. Tawny Asal, MD

## 2020-12-19 NOTE — Anesthesia Postprocedure Evaluation (Signed)
Anesthesia Post Note  Patient: Keith [name].  Procedure(s) Performed: LEFT TOTAL HIP ARTHROPLASTY ANTERIOR APPROACH (Left: Hip)     Patient location during evaluation: PACU Anesthesia Type: Spinal Level of consciousness: awake and alert and oriented Pain management: pain level controlled Vital Signs Assessment: post-procedure vital signs reviewed and stable Respiratory status: spontaneous breathing, nonlabored ventilation and respiratory function stable Cardiovascular status: blood pressure returned to baseline Postop Assessment: no apparent nausea or vomiting, spinal receding, no headache and no backache Anesthetic complications: no   No notable events documented.  Last Vitals:  Vitals:   12/19/20 1145 12/19/20 1215  BP: 139/85 128/76  Pulse: 62 71  Resp: (!) 21 (!) 25  Temp:  36.6 C  SpO2: 100% 100%    Last Pain:  Vitals:   12/19/20 1200  TempSrc:   PainSc: [place]

## 2020-12-19 NOTE — Transfer of Care (Signed)
Immediate Anesthesia Transfer of Care Note  Patient: Keith [name].  Procedure(s) Performed: LEFT TOTAL HIP ARTHROPLASTY ANTERIOR APPROACH (Left: Hip)  Patient Location: PACU  Anesthesia Type:Spinal  Level of Consciousness: drowsy  Airway & Oxygen Therapy: Patient Spontanous Breathing and Patient connected to face mask oxygen  Post-op Assessment: Report given to RN and Post -op Vital signs reviewed and stable.  Phenylephrine infusion continues  Post vital signs: Reviewed and stable  Last Vitals:  Vitals Value Taken Time  BP 112/77 12/19/20 1003  Temp    Pulse 69 12/19/20 1004  Resp 14 12/19/20 1004  SpO2 100 % 12/19/20 1004  Vitals shown include unvalidated device data.  Last Pain:  Vitals:   12/19/20 0624  TempSrc:   PainSc: 5       Patients Stated Pain Goal: 4 (34/03/52 4818)  Complications: No notable events documented.

## 2020-12-19 NOTE — Evaluation (Signed)
Physical Therapy Evaluation Patient Details Name: Keith [name]. MRN: [id number] DOB: 03-Jul-1941 Today's Date: 12/19/2020   History of Present Illness  Patient is 80 y.o. male s/p Lt THA anterior approach on 12/19/20 with PMH significant for OA, A-fib, GERD,Stroke, hypothyroidism, 4 back surgeries.    Clinical Impression  Emanuelle Bastos. is a 80 y.o. male POD 0 s/p Lt THA. Patient reports independence with RW for household mobility over the last 6 weeks due to pain limitations. Patient is now limited by functional impairments (see PT problem list below) and requires min-mod assist for bed mobility and min assist for transfers with RW. Patient was able to take several small side steps with RW and min assist at EOB but was limited by pain and further gait deferred. Patient will benefit from continued skilled PT interventions to address impairments and progress towards PLOF. Acute PT will follow to progress mobility and stair training in preparation for safe discharge home.     Follow Up Recommendations Follow surgeon's recommendation for DC plan and follow-up therapies;Home health PT    Equipment Recommendations  3in1 (PT)    Recommendations for Other Services       Precautions / Restrictions Precautions Precautions: Fall Restrictions Weight Bearing Restrictions: No LLE Weight Bearing: Weight bearing as tolerated      Mobility  Bed Mobility Overal bed mobility: Needs Assistance Bed Mobility: Supine to Sit;Sit to Supine     Supine to sit: HOB elevated;Min assist;Mod assist Sit to supine: HOB elevated;Min assist;Mod assist   General bed mobility comments: Min-Mod Assist for bring Lt LE off EOB and to raise trunk up. Pt leaning to Rt to unweight Lt hip due to pain. Pt required min-mod assist to bring LE's back into bed.    Transfers Overall transfer level: Needs assistance Equipment used: Rolling walker (2 wheeled) Transfers: Sit to/from Stand Sit to Stand: Min  assist;From elevated surface         General transfer comment: Min assist for power up from EOB. Cues needed for hand placement to power up and to steady with standing. Pt able to take small side steps at EOB to move closer to [facility]. pt limited by pain with WB and deferred bed>chair transfer.  Ambulation/Gait                Stairs            Wheelchair Mobility    Modified Rankin (Stroke Patients Only)       Balance Overall balance assessment: Needs assistance Sitting-balance support: Feet supported Sitting balance-Leahy Scale: Fair   Postural control: Right lateral lean Standing balance support: During functional activity;Bilateral upper extremity supported Standing balance-Leahy Scale: Poor                               Pertinent Vitals/Pain Pain Assessment: 0-10 Pain Score: 9  (2 when still) Pain Location: Lt hip Pain Descriptors / Indicators: Aching;Discomfort Pain Intervention(s): Limited activity within patient's tolerance;Monitored during session;Premedicated before session;Repositioned    Home Living Family/patient expects to be discharged to:: Private residence Living Arrangements: Spouse/significant other Available Help at Discharge: Family Type of Home: House Home Access: Stairs to enter Entrance Stairs-Rails: Psychiatric nurse of Steps: 4-5 Home Layout: One level Home Equipment: Environmental consultant - 2 wheels;Cane - single point;Shower seat      Prior Function Level of Independence: Independent with assistive device(s)  Comments: 6 weeks walking with RW due to pain, short household distances, gets SOB. 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     Hand Dominance   Dominant Hand: Right    Extremity/Trunk Assessment   Upper Extremity Assessment Upper Extremity Assessment: Overall WFL for tasks assessed    Lower Extremity Assessment Lower Extremity Assessment: LLE deficits/detail LLE Deficits /  Details: good quad activation LLE: Unable to fully assess due to pain LLE Sensation: WNL LLE Coordination: WNL    Cervical / Trunk Assessment Cervical / Trunk Assessment: Normal  Communication   Communication: No difficulties  Cognition Arousal/Alertness: Awake/alert Behavior During Therapy: WFL for tasks assessed/performed Overall Cognitive Status: Within Functional Limits for tasks assessed                                        General Comments      Exercises     Assessment/Plan    PT Assessment Patient needs continued PT services  PT Problem List Decreased strength;Decreased range of motion;Decreased activity tolerance;Decreased balance;Decreased knowledge of use of DME;Decreased safety awareness;Decreased knowledge of precautions;Pain       PT Treatment Interventions DME instruction;Gait training;Stair training;Functional mobility training;Therapeutic exercise;Therapeutic activities;Balance training;Patient/family education    PT Goals (Current goals can be found in the Care Plan section)  Acute Rehab PT Goals Patient Stated Goal: get back to walking more without pain PT Goal Formulation: With patient/family Time For Goal Achievement: 12/26/20 Potential to Achieve Goals: Good    Frequency 7X/week   Barriers to discharge        Co-evaluation               AM-PAC PT "6 Clicks" Mobility  Outcome Measure Help needed turning from your back to your side while in a flat bed without using bedrails?: A Little Help needed moving from lying on your back to sitting on the side of a flat bed without using bedrails?: A Little Help needed moving to and from a bed to a chair (including a wheelchair)?: A Little Help needed standing up from a chair using your arms (e.g., wheelchair or bedside chair)?: A Little Help needed to walk in hospital room?: A Little Help needed climbing 3-5 steps with a railing? : A Little 6 Click Score: 18    End of Session  Equipment Utilized During Treatment: Gait belt Activity Tolerance: Patient tolerated treatment well;Patient limited by pain Patient left: in bed;with call bell/phone within reach;with bed alarm set;with family/visitor present Nurse Communication: Mobility status PT Visit Diagnosis: Muscle weakness (generalized) (M62.81);Difficulty in walking, not elsewhere classified (R26.2);Pain Pain - Right/Left: Left Pain - part of body: Hip    Time: 3570-1779 PT Time Calculation (min) (ACUTE ONLY): 26 min   Charges:   PT Evaluation $PT Eval Low Complexity: 1 Low PT Treatments $Therapeutic Activity: 8-22 mins        Verner Mould, DPT Acute Rehabilitation Services Office [phone] Pager [phone]   Jacques Navy 12/19/2020, 5:44 PM

## 2020-12-20 ENCOUNTER — Other Ambulatory Visit (HOSPITAL_COMMUNITY): Payer: Self-pay

## 2020-12-20 DIAGNOSIS — I480 Paroxysmal atrial fibrillation: Secondary | ICD-10-CM | POA: Diagnosis present

## 2020-12-20 DIAGNOSIS — E039 Hypothyroidism, unspecified: Secondary | ICD-10-CM | POA: Diagnosis present

## 2020-12-20 DIAGNOSIS — Z79890 Hormone replacement therapy (postmenopausal): Secondary | ICD-10-CM | POA: Diagnosis not present

## 2020-12-20 DIAGNOSIS — Z7901 Long term (current) use of anticoagulants: Secondary | ICD-10-CM | POA: Diagnosis not present

## 2020-12-20 DIAGNOSIS — M1612 Unilateral primary osteoarthritis, left hip: Secondary | ICD-10-CM | POA: Diagnosis present

## 2020-12-20 DIAGNOSIS — K219 Gastro-esophageal reflux disease without esophagitis: Secondary | ICD-10-CM | POA: Diagnosis present

## 2020-12-20 DIAGNOSIS — Z85828 Personal history of other malignant neoplasm of skin: Secondary | ICD-10-CM | POA: Diagnosis not present

## 2020-12-20 DIAGNOSIS — Z87442 Personal history of urinary calculi: Secondary | ICD-10-CM | POA: Diagnosis not present

## 2020-12-20 DIAGNOSIS — I4892 Unspecified atrial flutter: Secondary | ICD-10-CM | POA: Diagnosis present

## 2020-12-20 DIAGNOSIS — I351 Nonrheumatic aortic (valve) insufficiency: Secondary | ICD-10-CM | POA: Diagnosis present

## 2020-12-20 DIAGNOSIS — Z79899 Other long term (current) drug therapy: Secondary | ICD-10-CM | POA: Diagnosis not present

## 2020-12-20 DIAGNOSIS — Z8673 Personal history of transient ischemic attack (TIA), and cerebral infarction without residual deficits: Secondary | ICD-10-CM | POA: Diagnosis not present

## 2020-12-20 DIAGNOSIS — Z20822 Contact with and (suspected) exposure to covid-19: Secondary | ICD-10-CM | POA: Diagnosis present

## 2020-12-20 DIAGNOSIS — Z953 Presence of xenogenic heart valve: Secondary | ICD-10-CM | POA: Diagnosis not present

## 2020-12-20 DIAGNOSIS — D62 Acute posthemorrhagic anemia: Secondary | ICD-10-CM | POA: Diagnosis not present

## 2020-12-20 DIAGNOSIS — I10 Essential (primary) hypertension: Secondary | ICD-10-CM | POA: Diagnosis present

## 2020-12-20 LAB — BASIC METABOLIC PANEL
Anion gap: 8 (ref 5–15)
BUN: 19 mg/dL (ref 8–23)
CO2: 24 mmol/L (ref 22–32)
Calcium: 8.2 mg/dL — ABNORMAL LOW (ref 8.9–10.3)
Chloride: 105 mmol/L (ref 98–111)
Creatinine, Ser: 0.98 mg/dL (ref 0.61–1.24)
GFR, Estimated: >60 mL/min (ref >60)
Glucose, Bld: 124 mg/dL — ABNORMAL HIGH (ref 70–99)
Potassium: 3.4 mmol/L — ABNORMAL LOW (ref 3.5–5.1)
Sodium: 137 mmol/L (ref 135–145)

## 2020-12-20 LAB — CBC
HCT: 36.7 % — ABNORMAL LOW (ref 39.0–52.0)
Hemoglobin: 12.5 g/dL — ABNORMAL LOW (ref 13.0–17.0)
MCH: 31.6 pg (ref 26.0–34.0)
MCHC: 34.1 g/dL (ref 30.0–36.0)
MCV: 92.9 fL (ref 80.0–100.0)
Platelets: 136 10*3/uL — ABNORMAL LOW (ref 150–400)
RBC: 3.95 MIL/uL — ABNORMAL LOW (ref 4.22–5.81)
RDW: 13.5 % (ref 11.5–15.5)
WBC: 13 10*3/uL — ABNORMAL HIGH (ref 4.0–10.5)
nRBC: 0 % (ref 0.0–0.2)

## 2020-12-20 MED ORDER — CYCLOBENZAPRINE HCL 5 MG PO TABS
5.0000 mg | ORAL_TABLET | Freq: Three times a day (TID) | ORAL | 0 refills | Status: DC | PRN
  Filled 2020-12-20: qty 30, 10d supply, fill #0

## 2020-12-20 MED ORDER — OXYCODONE-ACETAMINOPHEN 5-325 MG PO TABS
1.0000 | ORAL_TABLET | Freq: Four times a day (QID) | ORAL | 0 refills | Status: DC | PRN
  Filled 2020-12-20: qty 40, 5d supply, fill #0

## 2020-12-20 NOTE — Progress Notes (Signed)
Subjective: 1 Day Post-Op Procedure(s) (LRB): LEFT TOTAL HIP ARTHROPLASTY ANTERIOR APPROACH (Left) Patient reports pain as moderate.  His labs and vitals are stable.  Preop, he has had deconditioning due to the effects of a stroke affecting his left side.  His mobility is slow because of that.  He has not been up with therapy yet today.  Family is at the bedside.  He says he feels good.  He is motivated but weak.  Objective: Vital signs in last 24 hours: Temp:  [97.7 F (36.5 C)-99.7 F (37.6 C)] 99.7 F (37.6 C) (06/11 1022) Pulse Rate:  [60-86] 81 (06/11 1022) Resp:  [11-25] 16 (06/11 1022) BP: (118-163)/(62-91) 118/71 (06/11 1022) SpO2:  [95 %-100 %] 95 % (06/11 1026)  Intake/Output from previous day: 06/10 0701 - 06/11 0700 In: 3585 [P.O.:1260; I.V.:1925; IV Piggyback:400] Out: 1350 [Urine:1100; Blood:250] Intake/Output this shift: Total I/O In: 270 [P.O.:270] Out: 350 [Urine:350]  Recent Labs    12/18/20 0828 12/20/20 0318  HGB 15.4 12.5*   Recent Labs    12/18/20 0828 12/20/20 0318  WBC 9.7 13.0*  RBC 4.83 3.95*  HCT 45.3 36.7*  PLT 213 136*   Recent Labs    12/18/20 0828 12/20/20 0318  NA 141 137  K 4.4 3.4*  CL 107 105  CO2 27 24  BUN 27* 19  CREATININE 1.21 0.98  GLUCOSE 130* 124*  CALCIUM 9.3 8.2*   No results for input(s): LABPT, INR in the last 72 hours.  Sensation intact distally Intact pulses distally Incision: dressing C/D/I   Assessment/Plan: 1 Day Post-Op Procedure(s) (LRB): LEFT TOTAL HIP ARTHROPLASTY ANTERIOR APPROACH (Left) Up with therapy Plan for discharge tomorrow Discharge home with home health  He is definitely not ready for discharge to home today due to to the need for physical therapy to maximize his mobility while he is in the hospital and to work with stairs.  He can potentially be discharged home tomorrow.  His family agrees with this treatment plan as well.  He will be switched to an inpatient  admission.    Mcarthur Rossetti 12/20/2020, 11:10 AM

## 2020-12-20 NOTE — Plan of Care (Signed)
  Problem: Safety: Goal: Ability to remain free from injury will improve Outcome: Progressing   Problem: Clinical Measurements: Goal: Postoperative complications will be avoided or minimized Outcome: Progressing   Problem: Pain Management: Goal: Pain level will decrease with appropriate interventions Outcome: Progressing

## 2020-12-20 NOTE — Progress Notes (Signed)
Physical Therapy Treatment Patient Details Name: Keith [name]. MRN: [id number] DOB: 1941/06/24 Today's Date: 12/20/2020    History of Present Illness Patient is 80 y.o. male s/p Lt THA anterior approach on 12/19/20 with PMH significant for OA, A-fib, GERD,Stroke, hypothyroidism, 4 back surgeries.    PT Comments    Pt progressing although somewhat slowly. Amb 20' with min assist and incr time. Will benefit from at least another day of PT.  Continue PT POC.   Follow Up Recommendations  Follow surgeon's recommendation for DC plan and follow-up therapies;Home health PT     Equipment Recommendations  3in1 (PT)    Recommendations for Other Services       Precautions / Restrictions Precautions Precautions: Fall Restrictions LLE Weight Bearing: Weight bearing as tolerated    Mobility  Bed Mobility Overal bed mobility: Needs Assistance Bed Mobility: Supine to Sit     Supine to sit: Min assist;HOB elevated     General bed mobility comments: Min- Assist for bring Lt LE off EOB and to raise trunk up. Pt leaning to Rt to unweight Lt hip due to pain.    Transfers Overall transfer level: Needs assistance Equipment used: Rolling walker (2 wheeled) Transfers: Sit to/from Stand Sit to Stand: Min assist;From elevated surface;Min guard         General transfer comment: Min assist-min/guard to  power up from EOB. Cues needed for hand placement to power up and to steady with standing.  Ambulation/Gait Ambulation/Gait assistance: Min assist Gait Distance (Feet): 20 Feet Assistive device: Rolling walker (2 wheeled) Gait Pattern/deviations: Step-to pattern;Decreased stance time - left Gait velocity: decr   General Gait Details: cues for sequence, step length, RW position, trunk extension; incr time   Stairs             Wheelchair Mobility    Modified Rankin (Stroke Patients Only)       Balance Overall balance assessment: Needs assistance Sitting-balance  support: Feet supported;Single extremity supported Sitting balance-Leahy Scale: Fair     Standing balance support: During functional activity;Bilateral upper extremity supported Standing balance-Leahy Scale: Poor Standing balance comment: reliant on UEs                            Cognition Arousal/Alertness: Awake/alert Behavior During Therapy: WFL for tasks assessed/performed                                   General Comments: pt states he does not really know where he is. reports that he "walked yesterday and can walk better at his house" does not recall hip surgery      Exercises Total Joint Exercises Ankle Circles/Pumps: AROM;Both;15 reps Quad Sets: AROM;Both;5 reps Heel Slides: AAROM;Left;10 reps Hip ABduction/ADduction: AAROM;Left;10 reps    General Comments        Pertinent Vitals/Pain Pain Assessment: 0-10 Pain Score: 5  Pain Location: Lt hip Pain Descriptors / Indicators: Aching;Discomfort Pain Intervention(s): Limited activity within patient's tolerance;Monitored during session;Repositioned;Ice applied    Home Living                      Prior Function            PT Goals (current goals can now be found in the care plan section) Acute Rehab PT Goals Patient Stated Goal: get back to walking more without pain PT Goal  Formulation: With patient/family Time For Goal Achievement: 12/26/20 Potential to Achieve Goals: Good Progress towards PT goals: Progressing toward goals    Frequency    7X/week      PT Plan Current plan remains appropriate    Co-evaluation              AM-PAC PT "6 Clicks" Mobility   Outcome Measure  Help needed turning from your back to your side while in a flat bed without using bedrails?: A Little Help needed moving from lying on your back to sitting on the side of a flat bed without using bedrails?: A Little Help needed moving to and from a bed to a chair (including a wheelchair)?: A  Little Help needed standing up from a chair using your arms (e.g., wheelchair or bedside chair)?: A Little Help needed to walk in hospital room?: A Little Help needed climbing 3-5 steps with a railing? : A Little 6 Click Score: 18    End of Session Equipment Utilized During Treatment: Gait belt Activity Tolerance: Patient tolerated treatment well;Patient limited by pain;Patient limited by fatigue Patient left: with call bell/phone within reach;with family/visitor present;in chair;with chair alarm set Nurse Communication: Mobility status PT Visit Diagnosis: Muscle weakness (generalized) (M62.81);Difficulty in walking, not elsewhere classified (R26.2);Pain Pain - Right/Left: Left Pain - part of body: Hip     Time: 9357-0177 PT Time Calculation (min) (ACUTE ONLY): 24 min  Charges:  $Gait Training: 8-22 mins $Therapeutic Exercise: 8-22 mins                     Baxter Flattery, PT  Acute Rehab Dept ([facility]) [phone] Pager [phone]  12/20/2020    [facility] 12/20/2020, 4:26 PM

## 2020-12-20 NOTE — Progress Notes (Signed)
Patient transferred from chair to bed, tolerated well. Patient had attempted to move from chair w/out calling for assist but staff intervened. Reminded patient to use call bell or phone for assistance and explained why. Bed alarm in place. Patient verbalized understanding.

## 2020-12-20 NOTE — Discharge Instructions (Signed)

## 2020-12-20 NOTE — Progress Notes (Signed)
Physical Therapy Treatment Patient Details Name: Keith [name]. MRN: [id number] DOB: 1940-12-03 Today's Date: 12/20/2020    History of Present Illness Patient is 80 y.o. male s/p Lt THA anterior approach on 12/19/20 with PMH significant for OA, A-fib, GERD,Stroke, hypothyroidism, 4 back surgeries.    PT Comments    Pt progressing slowly, able to take steps today however tol limited distance d/t pain and fatigue. Continue PT   Follow Up Recommendations  Follow surgeon's recommendation for DC plan and follow-up therapies;Home health PT     Equipment Recommendations  3in1 (PT)    Recommendations for Other Services       Precautions / Restrictions Precautions Precautions: Fall Restrictions LLE Weight Bearing: Weight bearing as tolerated    Mobility  Bed Mobility Overal bed mobility: Needs Assistance Bed Mobility: Supine to Sit     Supine to sit: Min assist;HOB elevated Sit to supine: Min assist   General bed mobility comments: Min- Assist for bring Lt LE off EOB and to raise trunk up. Pt leaning to Rt to unweight Lt hip due to pain. able to come to full sit with incr time and cues for technique    Transfers Overall transfer level: Needs assistance Equipment used: Rolling walker (2 wheeled) Transfers: Sit to/from Stand Sit to Stand: Min assist;From elevated surface         General transfer comment: Min assist for power up from EOB. Cues needed for hand placement to power up and to steady with standing.  Ambulation/Gait Ambulation/Gait assistance: Min assist Gait Distance (Feet): 6 Feet Assistive device: Rolling walker (2 wheeled) Gait Pattern/deviations: Step-to pattern;Decreased stance time - left     General Gait Details: cues for sequence, step length, trunk extension   Stairs             Wheelchair Mobility    Modified Rankin (Stroke Patients Only)       Balance Overall balance assessment: Needs assistance Sitting-balance support: Feet  supported;Single extremity supported Sitting balance-Leahy Scale: Fair     Standing balance support: During functional activity;Bilateral upper extremity supported Standing balance-Leahy Scale: Poor Standing balance comment: reliant on UEs                            Cognition Arousal/Alertness: Awake/alert Behavior During Therapy: WFL for tasks assessed/performed Overall Cognitive Status: Within Functional Limits for tasks assessed (repeats same question-? decr STM?d/t meds)                                        Exercises Total Joint Exercises Ankle Circles/Pumps: AROM;Both;15 reps Quad Sets: AROM;Both;5 reps Heel Slides: AAROM;Left;10 reps    General Comments        Pertinent Vitals/Pain Pain Assessment: 0-10 Pain Score: 5  Pain Location: Lt hip Pain Descriptors / Indicators: Aching;Discomfort Pain Intervention(s): Limited activity within patient's tolerance;Monitored during session;Premedicated before session;Repositioned;Ice applied    Home Living                      Prior Function            PT Goals (current goals can now be found in the care plan section) Acute Rehab PT Goals Patient Stated Goal: get back to walking more without pain PT Goal Formulation: With patient/family Time For Goal Achievement: 12/26/20 Potential to Achieve Goals: Good Progress towards  PT goals: Progressing toward goals    Frequency    7X/week      PT Plan Current plan remains appropriate    Co-evaluation              AM-PAC PT "6 Clicks" Mobility   Outcome Measure  Help needed turning from your back to your side while in a flat bed without using bedrails?: A Little Help needed moving from lying on your back to sitting on the side of a flat bed without using bedrails?: A Little Help needed moving to and from a bed to a chair (including a wheelchair)?: A Little Help needed standing up from a chair using your arms (e.g., wheelchair  or bedside chair)?: A Little Help needed to walk in hospital room?: A Little Help needed climbing 3-5 steps with a railing? : A Little 6 Click Score: 18    End of Session Equipment Utilized During Treatment: Gait belt Activity Tolerance: Patient tolerated treatment well;Patient limited by pain;Patient limited by fatigue Patient left: with call bell/phone within reach;with family/visitor present;in chair;with chair alarm set Nurse Communication: Mobility status PT Visit Diagnosis: Muscle weakness (generalized) (M62.81);Difficulty in walking, not elsewhere classified (R26.2);Pain Pain - Right/Left: Left Pain - part of body: Hip     Time: 1119-1150 PT Time Calculation (min) (ACUTE ONLY): 31 min  Charges:  $Gait Training: 8-22 mins $Therapeutic Activity: 8-22 mins                     Baxter Flattery, PT  Acute Rehab Dept ([place]) [phone] Pager [phone]  12/20/2020    [facility] 12/20/2020, 12:02 PM

## 2020-12-21 NOTE — Progress Notes (Signed)
Physical Therapy Treatment Patient Details Name: Keith [name]. MRN: [id number] DOB: [birth year] Today's Date: 12/21/2020    History of Present Illness Patient is 80 y.o. male s/p Lt THA anterior approach on 12/19/20 with PMH significant for OA, A-fib, GERD,Stroke, hypothyroidism, 4 back surgeries.    PT Comments    Pt is improving, amb incr distance today, wife reports he looks better than prior to surgery.  Will work  on stair training this pm and hopefully pt will be ready to d/c later today    Follow Up Recommendations  Follow surgeon's recommendation for DC plan and follow-up therapies;Home health PT     Equipment Recommendations  3in1 (PT)    Recommendations for Other Services       Precautions / Restrictions Precautions Precautions: Fall Restrictions Weight Bearing Restrictions: No LLE Weight Bearing: Weight bearing as tolerated    Mobility  Bed Mobility Overal bed mobility: Needs Assistance Bed Mobility: Supine to Sit     Supine to sit: HOB elevated;Min guard     General bed mobility comments: cues for technique, min/guard for safety, to elevate trunk and wt shift on to L hip    Transfers Overall transfer level: Needs assistance Equipment used: Rolling walker (2 wheeled) Transfers: Sit to/from Stand Sit to Stand: Min guard         General transfer comment: min/guard to  power up from EOB. Cues needed for hand placement  Ambulation/Gait Ambulation/Gait assistance: Min guard;Min assist Gait Distance (Feet): 45 Feet Assistive device: Rolling walker (2 wheeled) Gait Pattern/deviations: Step-to pattern;Decreased stance time - left Gait velocity: decr   General Gait Details: cues for sequence, step length, RW position, trunk extension; incr time   Stairs             Wheelchair Mobility    Modified Rankin (Stroke Patients Only)       Balance Overall balance assessment: Needs assistance Sitting-balance support: Feet supported;Single  extremity supported Sitting balance-Leahy Scale: Fair     Standing balance support: During functional activity;Bilateral upper extremity supported Standing balance-Leahy Scale: Poor Standing balance comment: reliant on UEs                            Cognition Arousal/Alertness: Awake/alert Behavior During Therapy: WFL for tasks assessed/performed                                   General Comments: pt states he does not really know where he is. reports that he "walked yesterday and can walk better at his house" does not recall hip surgery      Exercises Total Joint Exercises Ankle Circles/Pumps: AROM;Both;15 reps Quad Sets: AROM;Both;5 reps Heel Slides: AAROM;Left;5 reps Hip ABduction/ADduction: AAROM;Left;10 reps Long Arc Quad: AROM;Left;10 reps;Seated    General Comments        Pertinent Vitals/Pain Pain Assessment: 0-10 Pain Score: 4  Pain Location: Lt hip Pain Descriptors / Indicators: Aching;Discomfort Pain Intervention(s): Limited activity within patient's tolerance;Monitored during session;Premedicated before session;Repositioned;Ice applied    Home Living                      Prior Function            PT Goals (current goals can now be found in the care plan section) Acute Rehab PT Goals Patient Stated Goal: get back to walking more without pain  PT Goal Formulation: With patient/family Time For Goal Achievement: 12/26/20 Potential to Achieve Goals: Good Progress towards PT goals: Progressing toward goals    Frequency    7X/week      PT Plan Current plan remains appropriate    Co-evaluation              AM-PAC PT "6 Clicks" Mobility   Outcome Measure  Help needed turning from your back to your side while in a flat bed without using bedrails?: A Little Help needed moving from lying on your back to sitting on the side of a flat bed without using bedrails?: A Little Help needed moving to and from a bed to a  chair (including a wheelchair)?: A Little Help needed standing up from a chair using your arms (e.g., wheelchair or bedside chair)?: A Little Help needed to walk in hospital room?: A Little Help needed climbing 3-5 steps with a railing? : A Little 6 Click Score: 18    End of Session Equipment Utilized During Treatment: Gait belt Activity Tolerance: Patient tolerated treatment well;Patient limited by pain;Patient limited by fatigue Patient left: with call bell/phone within reach;with family/visitor present;in chair;with chair alarm set Nurse Communication: Mobility status PT Visit Diagnosis: Muscle weakness (generalized) (M62.81);Difficulty in walking, not elsewhere classified (R26.2);Pain Pain - Right/Left: Left Pain - part of body: Hip     Time: 1023-1050 PT Time Calculation (min) (ACUTE ONLY): 27 min  Charges:  $Gait Training: 8-22 mins $Therapeutic Exercise: 8-22 mins                     Baxter Flattery, PT  Acute Rehab Dept ([place]) [phone] Pager [phone]  12/21/2020    [facility] 12/21/2020, 11:04 AM

## 2020-12-21 NOTE — TOC Transition Note (Addendum)
Transition of Care [facility]) - CM/SW Discharge Note  Patient Details  Name: Keith [name]. MRN: [id number] Date of Birth: 07-Jan-1941  Transition of Care [facility]) CM/SW Contact:  Sherie Don, LCSW Phone Number: 12/21/2020, 11:01 AM  Clinical Narrative: PT evaluation has recommended HHPT. CSW met with patient and wife to discuss recommendations and assess DME needs. Per patient and wife, he has a rolling walker, cane, and shower chair at home. Patient does not have a preference for [facility] agencies. CSW made referral for HHPT to [facility] with [facility]. Orders are in. Wife updated. TOC signing off.  Addendum: [employer] to deliver 3N1 to patient's room.  Final next level of care: [place] Barriers to Discharge: No Barriers Identified  Patient Goals and CMS Choice Patient states their goals for this hospitalization and ongoing recovery are:: Discharge home with [place] CMS Medicare.gov Compare Post Acute Care list provided to:: Patient Choice offered to / list presented to : Patient  Discharge Plan and Services         DME Arranged: N/A DME Agency: NA HH Arranged: PT [place] Agency: [place] ([facility]) Date [place]: 12/21/20 Time [place]: 52 Representative spoke with at [place]: Ramond Marrow  Readmission Risk Interventions No flowsheet data found.

## 2020-12-21 NOTE — Discharge Summary (Signed)
Patient ID: Keith [name]. MRN: [id number] DOB/AGE: [birth year] 81 y.o.  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 is an 80 y.o. male who was admitted 12/19/2020 for operative treatment ofPrimary osteoarthritis of left hip. Patient has severe unremitting pain that affects sleep, daily activities, and work/hobbies. After pre-op clearance the patient was taken to the operating room on 12/19/2020 and underwent  Procedure(s): LEFT TOTAL HIP ARTHROPLASTY ANTERIOR APPROACH.    Patient was given perioperative antibiotics:  Anti-infectives (From admission, onward)    Start     Dose/Rate Route Frequency Ordered Stop   12/19/20 1500  ceFAZolin (ANCEF) IVPB 1 g/50 mL premix        1 g 100 mL/hr over 30 Minutes Intravenous Every 6 hours 12/19/20 1247 12/19/20 2148   12/19/20 0615  ceFAZolin (ANCEF) IVPB 2g/100 mL premix        2 g 200 mL/hr over 30 Minutes Intravenous On call to O.R. 12/19/20 3419 12/19/20 0845        Patient was given sequential  compression devices, early ambulation, and chemoprophylaxis to prevent DVT.  Patient benefited maximally from hospital stay and there were no complications.    Recent vital signs: Patient Vitals for the past 24 hrs:  BP Temp Temp src Pulse Resp SpO2  12/21/20 0528 119/69 98.6 F (37 C) Oral 64 20 98 %  12/20/20 2157 (!) 144/66 98.4 F (36.9 C) Oral 74 18 100 %  12/20/20 1357 (!) 143/67 97.6 F (36.4 C) Oral 68 17 97 %  12/20/20 1026 -- -- -- -- -- 95 %  12/20/20 1022 118/71 99.7 F (37.6 C) Oral 81 16 97 %     Recent laboratory studies:  Recent Labs    12/20/20 0318  WBC 13.0*  HGB 12.5*  HCT 36.7*  PLT 136*  NA 137  K 3.4*  CL 105  CO2 24  BUN 19  CREATININE 0.98  GLUCOSE 124*  CALCIUM 8.2*     Discharge Medications:   Allergies as of 12/21/2020       Reactions   Tape         Medication List     TAKE these medications    cyclobenzaprine 5 MG tablet Commonly known as: FLEXERIL Take 1 tablet (5 mg total) by mouth 3 (three) times daily as needed for muscle spasms.   Eliquis 5 MG Tabs tablet Generic drug: apixaban Take 5 mg by mouth 2 (  two) times daily.   famotidine 20 MG tablet Commonly known as: PEPCID Take 20 mg by mouth 2 (two) times daily as needed for heartburn or indigestion.   Glucosamine MSM Complex Tabs Take 1 tablet by mouth at bedtime.   levothyroxine 50 MCG tablet Commonly known as: SYNTHROID TAKE 1 TABLET BY MOUTH ONCE A DAY BEFORE BREAKFAST What changed:  how much to take how to take this when to take this   metoprolol tartrate 25 MG tablet Commonly known as: LOPRESSOR Take 25 mg by mouth 2 (two) times daily.   oxyCODONE-acetaminophen 5-325 MG tablet Commonly known as: PERCOCET/ROXICET Take 1-2 tablets by mouth every 6 (six) hours as needed for severe pain. What changed:  how much to take how to take this when to take this reasons to take this   pantoprazole 40 MG tablet Commonly known as: PROTONIX TAKE 1 TABLET BY  MOUTH ONCE DAILY What changed: how much to take               Durable Medical Equipment  (From admission, onward)           Start     Ordered   12/19/20 1248  DME 3 n 1  Once        12/19/20 1247   12/19/20 1248  DME Walker rolling  Once       Question Answer Comment  Walker: With 5 Inch Wheels   Patient needs a walker to treat with the following condition Status post left hip replacement      12/19/20 1247            Diagnostic Studies: DG Pelvis Portable  Result Date: 12/19/2020 CLINICAL DATA:  Follow-up left hip arthroplasty EXAM: PORTABLE PELVIS 1-2 VIEWS COMPARISON:  Earlier same day FINDINGS: Total hip arthroplasty on the left. Components appear well positioned. No radiographically detectable complication. IMPRESSION: Good appearance following total hip arthroplasty on the left. Electronically Signed   By: Nelson Chimes M.D.   On: 12/19/2020 11:12   MR HIP LEFT WO CONTRAST  Result Date: 11/22/2020 CLINICAL DATA:  Left hip and leg pain for the past 3 months. EXAM: MR OF THE LEFT HIP WITHOUT CONTRAST TECHNIQUE: Multiplanar, multisequence MR imaging was performed. No intravenous contrast was administered. COMPARISON:  CT abdomen pelvis dated Dec 07, 2019. 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 No acute fracture or dislocation. No focal bone lesion. The visualized sacroiliac joints and symphysis pubis appear normal. Prior lumbar fusion. Articular cartilage and labrum Articular cartilage: Moderate bilateral hip joint space narrowing and diffuse cartilage thinning. No subchondral signal abnormality identified. Labrum: Bilateral anterior superior labral tears. Joint or bursal effusion Joint effusion: No significant hip joint effusion. Bursae: No focal periarticular fluid collection. Muscles and tendons Muscles and tendons: Small partial tears of the right gluteus minimus and  left hamstring origin tendons. The visualized iliopsoas tendons appear normal. Mild edema in the left adductor muscles. No significant muscle atrophy. Other findings Miscellaneous: Mild prostatomegaly with median lobe hypertrophy indenting the bladder base. Severe sigmoid colonic diverticulosis. IMPRESSION: 1. Moderate bilateral hip osteoarthritis. 2. Bilateral anterior superior labral tears. 3. 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 4. Small partial tears of the right gluteus minimus and left hamstring origin tendons. Electronically Signed   By: Titus Dubin M.D.   On: 11/22/2020 10:25   DG C-Arm 1-60 Min-No Report  Result Date: 12/19/2020 Fluoroscopy was  utilized by the requesting physician.  No radiographic interpretation.   DG HIP OPERATIVE UNILAT W OR W/O PELVIS LEFT  Result Date: 12/19/2020 CLINICAL DATA:  Status post total hip replacement EXAM: OPERATIVE LEFT HIP   2 VIEWS TECHNIQUE: Fluoroscopic spot image(s) were submitted for interpretation post-operatively. FLUOROSCOPY TIME:  0 minutes 29 seconds; 5 acquired images COMPARISON:  Dec 04, 2020 FINDINGS: Frontal and lateral views obtained. There is a total hip replacement on the right with prosthetic components well-seated. No fracture or dislocation. There is moderate narrowing of the right hip joint, unchanged. IMPRESSION: Total hip replacement on the left with prosthetic components well-seated. No fracture or dislocation. Narrowing right hip joint. Electronically Signed   By: Lowella Grip III M.D.   On: 12/19/2020 10:13   XR HIP UNILAT W OR W/O PELVIS 2-3 VIEWS LEFT  Result Date: 12/04/2020 AP pelvis lateral view left hip: Both hips well located no acute fractures.  Moderate narrowing bilateral hip joints with sclerotic changes in the femoral heads bilaterally.  No other bony abnormalities.   Disposition: Discharge disposition: 01-Home or Self Care          Follow-up Information     Mcarthur Rossetti, MD Follow up in 2 week(s).   Specialty: Orthopedic Surgery Contact information: [address] [place] [address] [phone]                  Signed: Aundra Dubin 12/21/2020, 9:35 AM

## 2020-12-21 NOTE — Progress Notes (Addendum)
The patient is alert and oriented and has been seen by his physician. The orders for discharge were written. IV has been removed. Went over discharge instructions with patient and family. He is being discharged via wheelchair with all of his belongings.  

## 2020-12-21 NOTE — Progress Notes (Signed)
Physical Therapy Treatment Patient Details Name: Keith [name]. MRN: [id number] DOB: Sep 17, 1940 Today's Date: 12/21/2020    History of Present Illness Patient is 80 y.o. male s/p Lt THA anterior approach on 12/19/20 with PMH significant for OA, A-fib, GERD,Stroke, hypothyroidism, 4 back surgeries.    PT Comments    Timmothy Sours is making excellent progress. Amb 28' and reviewed stairs with spouse assisting this pm. Pt and spouse feel ready to d/c home, PT in agreement. Recommend initial assist/supervision with mobility until pt works with HHPT   Follow Up Recommendations  Follow surgeon's recommendation for DC plan and follow-up therapies;Home health PT     Equipment Recommendations  3in1 (PT)    Recommendations for Other Services       Precautions / Restrictions Precautions Precautions: Fall Restrictions LLE Weight Bearing: Weight bearing as tolerated    Mobility  Bed Mobility Overal bed mobility: Needs Assistance Bed Mobility: Supine to Sit;Sit to Supine     Supine to sit: HOB elevated;Min guard Sit to supine: Supervision;Min guard   General bed mobility comments: cues for technique, min/guard for safety, uses gait belt to self assist    Transfers Overall transfer level: Needs assistance Equipment used: Rolling walker (2 wheeled) Transfers: Sit to/from Stand Sit to Stand: Min guard         General transfer comment: min/guard to  power up from EOB. Cues needed for hand placement, incr time  Ambulation/Gait Ambulation/Gait assistance: Min guard;Supervision Gait Distance (Feet): 60 Feet Assistive device: Rolling walker (2 wheeled) Gait Pattern/deviations: Step-to pattern;Decreased stance time - left Gait velocity: decr   General Gait Details: cues for sequence, step length, RW position, trunk extension; incr time   Stairs Stairs: Yes Stairs assistance: Min assist;Min guard Stair Management: Two rails;Step to pattern;Forwards Number of Stairs: 3 General stair  comments: cues for sequence, wife present for stair training   Wheelchair Mobility    Modified Rankin (Stroke Patients Only)       Balance Overall balance assessment: Needs assistance Sitting-balance support: Feet supported;Single extremity supported Sitting balance-Leahy Scale: Fair     Standing balance support: During functional activity;Bilateral upper extremity supported Standing balance-Leahy Scale: Poor Standing balance comment: reliant on UEs                            Cognition Arousal/Alertness: Awake/alert Behavior During Therapy: WFL for tasks assessed/performed Overall Cognitive Status: Within Functional Limits for tasks assessed                                        Exercises Total Joint Exercises Ankle Circles/Pumps: AROM;Both;15 reps    General Comments        Pertinent Vitals/Pain Pain Assessment: 0-10 Pain Score: 5  Pain Location: Lt hip with amb Pain Descriptors / Indicators: Aching;Discomfort;Sore Pain Intervention(s): Limited activity within patient's tolerance;Monitored during session;Premedicated before session;Repositioned;Ice applied    Home Living                      Prior Function            PT Goals (current goals can now be found in the care plan section) Acute Rehab PT Goals Patient Stated Goal: get back to walking more without pain PT Goal Formulation: With patient/family Time For Goal Achievement: 12/26/20 Potential to Achieve Goals: Good Progress towards PT goals:  Progressing toward goals    Frequency    7X/week      PT Plan Current plan remains appropriate    Co-evaluation              AM-PAC PT "6 Clicks" Mobility   Outcome Measure  Help needed turning from your back to your side while in a flat bed without using bedrails?: A Little Help needed moving from lying on your back to sitting on the side of a flat bed without using bedrails?: A Little Help needed moving to  and from a bed to a chair (including a wheelchair)?: A Little Help needed standing up from a chair using your arms (e.g., wheelchair or bedside chair)?: A Little Help needed to walk in hospital room?: A Little Help needed climbing 3-5 steps with a railing? : A Little 6 Click Score: 18    End of Session Equipment Utilized During Treatment: Gait belt Activity Tolerance: Patient tolerated treatment well Patient left: with call bell/phone within reach;with family/visitor present;in bed;with bed alarm set Nurse Communication: Mobility status PT Visit Diagnosis: Muscle weakness (generalized) (M62.81);Difficulty in walking, not elsewhere classified (R26.2);Pain Pain - Right/Left: Left Pain - part of body: Hip     Time: 6435-3912 PT Time Calculation (min) (ACUTE ONLY): 32 min  Charges:  $Gait Training: 23-37 mins                     Baxter Flattery, PT  Acute Rehab Dept ([place]) [phone] Pager [phone]  12/21/2020    [facility] 12/21/2020, 3:29 PM

## 2020-12-21 NOTE — Plan of Care (Signed)
  Problem: Education: Goal: Knowledge of General Education information will improve Description Including pain rating scale, medication(s)/side effects and non-pharmacologic comfort measures Outcome: Progressing   

## 2020-12-21 NOTE — Progress Notes (Signed)
Subjective: 2 Days Post-Op Procedure(s) (LRB): LEFT TOTAL HIP ARTHROPLASTY ANTERIOR APPROACH (Left) Patient reports pain as mild.  Feeling better today  Objective: Vital signs in last 24 hours: Temp:  [97.6 F (36.4 C)-99.7 F (37.6 C)] 98.6 F (37 C) (06/12 0528) Pulse Rate:  [64-81] 64 (06/12 0528) Resp:  [16-20] 20 (06/12 0528) BP: (118-144)/(66-71) 119/69 (06/12 0528) SpO2:  [95 %-100 %] 98 % (06/12 0528)  Intake/Output from previous day: 06/11 0701 - 06/12 0700 In: 1069.8 [P.O.:870; I.V.:199.8] Out: 820 [Urine:820] Intake/Output this shift: Total I/O In: 120 [P.O.:120] Out: 300 [Urine:300]  Recent Labs    12/20/20 0318  HGB 12.5*   Recent Labs    12/20/20 0318  WBC 13.0*  RBC 3.95*  HCT 36.7*  PLT 136*   Recent Labs    12/20/20 0318  NA 137  K 3.4*  CL 105  CO2 24  BUN 19  CREATININE 0.98  GLUCOSE 124*  CALCIUM 8.2*   No results for input(s): LABPT, INR in the last 72 hours.  Neurologically intact Neurovascular intact Sensation intact distally Intact pulses distally Dorsiflexion/Plantar flexion intact Incision: dressing C/D/I No cellulitis present Compartment soft   Assessment/Plan: 2 Days Post-Op Procedure(s) (LRB): LEFT TOTAL HIP ARTHROPLASTY ANTERIOR APPROACH (Left) Advance diet Up with therapy D/C IV fluids Discharge home with home health after PT session as long as he mobilizes well and passes stair training. WBAT LLE ABLA- mild and stable      Aundra Dubin 12/21/2020, 9:33 AM

## 2020-12-22 ENCOUNTER — Other Ambulatory Visit: Payer: Self-pay | Admitting: Urology

## 2020-12-22 ENCOUNTER — Other Ambulatory Visit (HOSPITAL_COMMUNITY): Payer: Self-pay

## 2020-12-22 ENCOUNTER — Encounter (HOSPITAL_COMMUNITY): Payer: Self-pay | Admitting: Orthopaedic Surgery

## 2020-12-22 DIAGNOSIS — E039 Hypothyroidism, unspecified: Secondary | ICD-10-CM | POA: Diagnosis not present

## 2020-12-22 DIAGNOSIS — M1611 Unilateral primary osteoarthritis, right hip: Secondary | ICD-10-CM | POA: Diagnosis not present

## 2020-12-22 DIAGNOSIS — Z96642 Presence of left artificial hip joint: Secondary | ICD-10-CM | POA: Diagnosis not present

## 2020-12-22 DIAGNOSIS — I499 Cardiac arrhythmia, unspecified: Secondary | ICD-10-CM | POA: Diagnosis not present

## 2020-12-22 DIAGNOSIS — I4892 Unspecified atrial flutter: Secondary | ICD-10-CM | POA: Diagnosis not present

## 2020-12-22 DIAGNOSIS — I4891 Unspecified atrial fibrillation: Secondary | ICD-10-CM | POA: Diagnosis not present

## 2020-12-22 DIAGNOSIS — Z8673 Personal history of transient ischemic attack (TIA), and cerebral infarction without residual deficits: Secondary | ICD-10-CM | POA: Diagnosis not present

## 2020-12-22 DIAGNOSIS — Z471 Aftercare following joint replacement surgery: Secondary | ICD-10-CM | POA: Diagnosis not present

## 2020-12-22 DIAGNOSIS — K219 Gastro-esophageal reflux disease without esophagitis: Secondary | ICD-10-CM | POA: Diagnosis not present

## 2020-12-23 ENCOUNTER — Other Ambulatory Visit (HOSPITAL_COMMUNITY): Payer: Self-pay

## 2020-12-23 ENCOUNTER — Encounter: Payer: Self-pay | Admitting: *Deleted

## 2020-12-23 ENCOUNTER — Other Ambulatory Visit: Payer: Self-pay | Admitting: *Deleted

## 2020-12-23 ENCOUNTER — Telehealth: Payer: Self-pay | Admitting: Orthopaedic Surgery

## 2020-12-23 NOTE — Telephone Encounter (Signed)
Doren Custard with [facility] called to get verbal orders okayed, with the frequency of 3 times a week for 1 week and then 2 times a week for 2 weeks. He would like a CB  CB# [phone] *Ok to LVM

## 2020-12-23 NOTE — Patient Outreach (Signed)
[facility]) Care Management  12/23/2020  [name]. 07-21-1940 [id number]   Transition of care call/case closure   Referral received:12/22/20 Initial outreach:12/23/20 Insurance: [employer]    Subjective: Initial successful telephone call to patient's preferred number in order to complete transition of care assessment; 2 HIPAA identifiers verified. Explained purpose of call and completed transition of care assessment.  Mr. Fye states that he is doing fine, he denies post-operative problems, says surgical incisions are unremarkable with dressing in place states surgical pain well managed with prescribed medications,he reports taking just tylenol as oxycodone upset his stomach. He  tolerating diet, denies bowel or bladder problems.He reports tolerating mobility in the home using the rolling walker. He reports initial home health visit for therapy on yesterday. Patient reports having incentive spirometry, states that he has not been using, encouraged use of  reviewing benefits of use after surgery.  Spouse is assisting with his recovery   Reviewed accessing the following [place] Benefits : He denies need for a referral to one of the [place] chronic disease management programs after explanation and offer.  He uses a [facility] outpatient pharmacy at [facility].  .    Objective:  Mr.Loi Taddei was hospitalized at [facility] 6/11-6/12/22 for Left Total hip Arthroplasty -Anterior Approach . Past Medical significant for : CVA, GERD , Osteoarthritis,  Arthritis, Atrial fib, AVR, Hypertension , Nephrolithiasis,Hyperlipidemia. He was discharged to home on 12/21/20  with [facility] therapy and  home health and DME of 3 in 1.   Assessment:  Patient voices good understanding of all discharge instructions.  See transition of care flowsheet for assessment details.   Plan:  Reviewed hospital discharge diagnosis of Left Total hip  arthroplasty -Anterior approach   and discharge treatment plan using hospital discharge instructions, assessing medication adherence, reviewing problems requiring provider notification, and discussing the importance of follow up with surgeon, primary care provider and/or specialists as directed.  Reviewed  healthy lifestyle program information to receive discounted premium for  2023   Step 1: Get  your annual physical  Step 2: Complete your health assessment  Step 3:Identify your current health status and complete the corresponding action step between July 12, 2020 and March 12, 2021.     No ongoing care management needs identified so will close case to [place] Management services and route successful outreach letter with [place] Management pamphlet and 24 Hour Nurse Line Magnet to [place] [facility] clinical pool to be mailed to patient's home address.   Joylene Draft, RN, BSN  [place] Management Coordinator  [phone]- Mobile [phone]- Toll Free Main Office

## 2020-12-24 ENCOUNTER — Other Ambulatory Visit: Payer: Self-pay | Admitting: Urology

## 2020-12-24 ENCOUNTER — Other Ambulatory Visit (HOSPITAL_COMMUNITY): Payer: Self-pay

## 2020-12-24 DIAGNOSIS — Z471 Aftercare following joint replacement surgery: Secondary | ICD-10-CM | POA: Diagnosis not present

## 2020-12-24 DIAGNOSIS — Z96642 Presence of left artificial hip joint: Secondary | ICD-10-CM | POA: Diagnosis not present

## 2020-12-24 DIAGNOSIS — M1611 Unilateral primary osteoarthritis, right hip: Secondary | ICD-10-CM | POA: Diagnosis not present

## 2020-12-24 DIAGNOSIS — I4892 Unspecified atrial flutter: Secondary | ICD-10-CM | POA: Diagnosis not present

## 2020-12-24 DIAGNOSIS — K219 Gastro-esophageal reflux disease without esophagitis: Secondary | ICD-10-CM | POA: Diagnosis not present

## 2020-12-24 DIAGNOSIS — I4891 Unspecified atrial fibrillation: Secondary | ICD-10-CM | POA: Diagnosis not present

## 2020-12-24 DIAGNOSIS — I499 Cardiac arrhythmia, unspecified: Secondary | ICD-10-CM | POA: Diagnosis not present

## 2020-12-24 DIAGNOSIS — Z8673 Personal history of transient ischemic attack (TIA), and cerebral infarction without residual deficits: Secondary | ICD-10-CM | POA: Diagnosis not present

## 2020-12-24 DIAGNOSIS — E039 Hypothyroidism, unspecified: Secondary | ICD-10-CM | POA: Diagnosis not present

## 2020-12-24 NOTE — Telephone Encounter (Signed)
Verbal order given  

## 2020-12-25 ENCOUNTER — Other Ambulatory Visit (HOSPITAL_COMMUNITY): Payer: Self-pay

## 2020-12-26 ENCOUNTER — Telehealth: Payer: Self-pay

## 2020-12-26 DIAGNOSIS — Z8673 Personal history of transient ischemic attack (TIA), and cerebral infarction without residual deficits: Secondary | ICD-10-CM | POA: Diagnosis not present

## 2020-12-26 DIAGNOSIS — I499 Cardiac arrhythmia, unspecified: Secondary | ICD-10-CM | POA: Diagnosis not present

## 2020-12-26 DIAGNOSIS — I4892 Unspecified atrial flutter: Secondary | ICD-10-CM | POA: Diagnosis not present

## 2020-12-26 DIAGNOSIS — K219 Gastro-esophageal reflux disease without esophagitis: Secondary | ICD-10-CM | POA: Diagnosis not present

## 2020-12-26 DIAGNOSIS — I4891 Unspecified atrial fibrillation: Secondary | ICD-10-CM | POA: Diagnosis not present

## 2020-12-26 DIAGNOSIS — M1611 Unilateral primary osteoarthritis, right hip: Secondary | ICD-10-CM | POA: Diagnosis not present

## 2020-12-26 DIAGNOSIS — Z96642 Presence of left artificial hip joint: Secondary | ICD-10-CM | POA: Diagnosis not present

## 2020-12-26 DIAGNOSIS — E039 Hypothyroidism, unspecified: Secondary | ICD-10-CM | POA: Diagnosis not present

## 2020-12-26 DIAGNOSIS — Z471 Aftercare following joint replacement surgery: Secondary | ICD-10-CM | POA: Diagnosis not present

## 2020-12-26 NOTE — Telephone Encounter (Signed)
I talked to the wife. She stated the area around the bandage isnt hot or warm to touch, No smell is coming from incision, pt does have a history of being on elliquis and is currently taking ibuprofen. The bandage isnt over saturated and hasnt had to change it yet. Wife just wanted me to run it by dr. Ninfa Linden to make sure it is normal to have bleeding still. Please advise

## 2020-12-26 NOTE — Telephone Encounter (Signed)
Patients spouse called she stated during therapy she removed the patients bandage and the wound was oozing,patients spouse stated the therapist told her to draw a circle around the wound to see if the area grew larger, she stated the area has gotten larger over the last couple of days, she is requesting a call back:[phone]

## 2020-12-26 NOTE — Telephone Encounter (Signed)
Called wife and informed and stated understanding

## 2020-12-31 DIAGNOSIS — I4892 Unspecified atrial flutter: Secondary | ICD-10-CM | POA: Diagnosis not present

## 2020-12-31 DIAGNOSIS — Z96642 Presence of left artificial hip joint: Secondary | ICD-10-CM | POA: Diagnosis not present

## 2020-12-31 DIAGNOSIS — E039 Hypothyroidism, unspecified: Secondary | ICD-10-CM | POA: Diagnosis not present

## 2020-12-31 DIAGNOSIS — K219 Gastro-esophageal reflux disease without esophagitis: Secondary | ICD-10-CM | POA: Diagnosis not present

## 2020-12-31 DIAGNOSIS — Z8673 Personal history of transient ischemic attack (TIA), and cerebral infarction without residual deficits: Secondary | ICD-10-CM | POA: Diagnosis not present

## 2020-12-31 DIAGNOSIS — I499 Cardiac arrhythmia, unspecified: Secondary | ICD-10-CM | POA: Diagnosis not present

## 2020-12-31 DIAGNOSIS — Z471 Aftercare following joint replacement surgery: Secondary | ICD-10-CM | POA: Diagnosis not present

## 2020-12-31 DIAGNOSIS — I4891 Unspecified atrial fibrillation: Secondary | ICD-10-CM | POA: Diagnosis not present

## 2020-12-31 DIAGNOSIS — M1611 Unilateral primary osteoarthritis, right hip: Secondary | ICD-10-CM | POA: Diagnosis not present

## 2021-01-01 ENCOUNTER — Other Ambulatory Visit (HOSPITAL_COMMUNITY): Payer: Self-pay

## 2021-01-01 ENCOUNTER — Ambulatory Visit (INDEPENDENT_AMBULATORY_CARE_PROVIDER_SITE_OTHER): Payer: 59 | Admitting: Physician Assistant

## 2021-01-01 ENCOUNTER — Encounter: Payer: Self-pay | Admitting: Physician Assistant

## 2021-01-01 DIAGNOSIS — Z96642 Presence of left artificial hip joint: Secondary | ICD-10-CM

## 2021-01-01 MED ORDER — CYCLOBENZAPRINE HCL 5 MG PO TABS
5.0000 mg | ORAL_TABLET | Freq: Three times a day (TID) | ORAL | 0 refills | Status: DC | PRN
  Filled 2021-01-01: qty 30, 10d supply, fill #0

## 2021-01-01 MED ORDER — OXYCODONE-ACETAMINOPHEN 5-325 MG PO TABS
1.0000 | ORAL_TABLET | Freq: Four times a day (QID) | ORAL | 0 refills | Status: DC | PRN
  Filled 2021-01-01: qty 40, 5d supply, fill #0

## 2021-01-01 NOTE — Progress Notes (Signed)
HPI: Mr. Moster returns today 13 days status post left total hip arthroplasty.  Patient is overall doing well.  He is using a rolling walker to ambulate.  He has been on p.o. anticoagulation as he reports that he had stopped his Eliquis 2 months prior to having surgery has not gone back on the Eliquis.  He has had no shortness of breath fevers chills.  Physical exam: Left hip surgical incisions well approximated staples no signs of infection.  Positive seroma 32 cc serosanguineous fluid aspirated.  Patient tolerated well.  Does have swelling left lower leg mid calf is nontender.  Ambulates with a rolling walker.  Impression: Status post left total hip arthroplasty 13 days postop.  Plan: Refill his pain medication muscle relaxant.  He will discuss with his cardiologist he should go back on the Eliquis.  If he develops any calf pain or significant swelling the left lower leg will call us so we can order a Doppler to rule out DVT.  Otherwise we will see him back in 4 weeks unless he has recurrent seroma or any wound issues.  Questions were encouraged and answered at length

## 2021-01-02 ENCOUNTER — Other Ambulatory Visit (HOSPITAL_COMMUNITY): Payer: Self-pay

## 2021-01-02 DIAGNOSIS — I4891 Unspecified atrial fibrillation: Secondary | ICD-10-CM | POA: Diagnosis not present

## 2021-01-02 DIAGNOSIS — Z8673 Personal history of transient ischemic attack (TIA), and cerebral infarction without residual deficits: Secondary | ICD-10-CM | POA: Diagnosis not present

## 2021-01-02 DIAGNOSIS — N401 Enlarged prostate with lower urinary tract symptoms: Secondary | ICD-10-CM | POA: Diagnosis not present

## 2021-01-02 DIAGNOSIS — N4883 Acquired buried penis: Secondary | ICD-10-CM | POA: Diagnosis not present

## 2021-01-02 DIAGNOSIS — N200 Calculus of kidney: Secondary | ICD-10-CM | POA: Diagnosis not present

## 2021-01-02 DIAGNOSIS — Z471 Aftercare following joint replacement surgery: Secondary | ICD-10-CM | POA: Diagnosis not present

## 2021-01-02 DIAGNOSIS — M1611 Unilateral primary osteoarthritis, right hip: Secondary | ICD-10-CM | POA: Diagnosis not present

## 2021-01-02 DIAGNOSIS — K219 Gastro-esophageal reflux disease without esophagitis: Secondary | ICD-10-CM | POA: Diagnosis not present

## 2021-01-02 DIAGNOSIS — I499 Cardiac arrhythmia, unspecified: Secondary | ICD-10-CM | POA: Diagnosis not present

## 2021-01-02 DIAGNOSIS — Z96642 Presence of left artificial hip joint: Secondary | ICD-10-CM | POA: Diagnosis not present

## 2021-01-02 DIAGNOSIS — R351 Nocturia: Secondary | ICD-10-CM | POA: Diagnosis not present

## 2021-01-02 DIAGNOSIS — I4892 Unspecified atrial flutter: Secondary | ICD-10-CM | POA: Diagnosis not present

## 2021-01-02 DIAGNOSIS — E039 Hypothyroidism, unspecified: Secondary | ICD-10-CM | POA: Diagnosis not present

## 2021-01-08 DIAGNOSIS — Z8673 Personal history of transient ischemic attack (TIA), and cerebral infarction without residual deficits: Secondary | ICD-10-CM | POA: Diagnosis not present

## 2021-01-08 DIAGNOSIS — Z96642 Presence of left artificial hip joint: Secondary | ICD-10-CM | POA: Diagnosis not present

## 2021-01-08 DIAGNOSIS — Z471 Aftercare following joint replacement surgery: Secondary | ICD-10-CM | POA: Diagnosis not present

## 2021-01-08 DIAGNOSIS — E039 Hypothyroidism, unspecified: Secondary | ICD-10-CM | POA: Diagnosis not present

## 2021-01-08 DIAGNOSIS — M1611 Unilateral primary osteoarthritis, right hip: Secondary | ICD-10-CM | POA: Diagnosis not present

## 2021-01-08 DIAGNOSIS — I4892 Unspecified atrial flutter: Secondary | ICD-10-CM | POA: Diagnosis not present

## 2021-01-08 DIAGNOSIS — I4891 Unspecified atrial fibrillation: Secondary | ICD-10-CM | POA: Diagnosis not present

## 2021-01-08 DIAGNOSIS — K219 Gastro-esophageal reflux disease without esophagitis: Secondary | ICD-10-CM | POA: Diagnosis not present

## 2021-01-08 DIAGNOSIS — I499 Cardiac arrhythmia, unspecified: Secondary | ICD-10-CM | POA: Diagnosis not present

## 2021-01-09 DIAGNOSIS — M1611 Unilateral primary osteoarthritis, right hip: Secondary | ICD-10-CM | POA: Diagnosis not present

## 2021-01-09 DIAGNOSIS — Z471 Aftercare following joint replacement surgery: Secondary | ICD-10-CM | POA: Diagnosis not present

## 2021-01-09 DIAGNOSIS — Z8673 Personal history of transient ischemic attack (TIA), and cerebral infarction without residual deficits: Secondary | ICD-10-CM | POA: Diagnosis not present

## 2021-01-09 DIAGNOSIS — I4891 Unspecified atrial fibrillation: Secondary | ICD-10-CM | POA: Diagnosis not present

## 2021-01-09 DIAGNOSIS — I4892 Unspecified atrial flutter: Secondary | ICD-10-CM | POA: Diagnosis not present

## 2021-01-09 DIAGNOSIS — Z96642 Presence of left artificial hip joint: Secondary | ICD-10-CM | POA: Diagnosis not present

## 2021-01-09 DIAGNOSIS — K219 Gastro-esophageal reflux disease without esophagitis: Secondary | ICD-10-CM | POA: Diagnosis not present

## 2021-01-09 DIAGNOSIS — E039 Hypothyroidism, unspecified: Secondary | ICD-10-CM | POA: Diagnosis not present

## 2021-01-09 DIAGNOSIS — I499 Cardiac arrhythmia, unspecified: Secondary | ICD-10-CM | POA: Diagnosis not present

## 2021-01-19 DIAGNOSIS — K219 Gastro-esophageal reflux disease without esophagitis: Secondary | ICD-10-CM | POA: Diagnosis not present

## 2021-01-19 DIAGNOSIS — I499 Cardiac arrhythmia, unspecified: Secondary | ICD-10-CM | POA: Diagnosis not present

## 2021-01-19 DIAGNOSIS — E039 Hypothyroidism, unspecified: Secondary | ICD-10-CM | POA: Diagnosis not present

## 2021-01-19 DIAGNOSIS — M1611 Unilateral primary osteoarthritis, right hip: Secondary | ICD-10-CM | POA: Diagnosis not present

## 2021-01-19 DIAGNOSIS — Z8673 Personal history of transient ischemic attack (TIA), and cerebral infarction without residual deficits: Secondary | ICD-10-CM | POA: Diagnosis not present

## 2021-01-19 DIAGNOSIS — I4891 Unspecified atrial fibrillation: Secondary | ICD-10-CM | POA: Diagnosis not present

## 2021-01-19 DIAGNOSIS — Z96642 Presence of left artificial hip joint: Secondary | ICD-10-CM | POA: Diagnosis not present

## 2021-01-19 DIAGNOSIS — Z471 Aftercare following joint replacement surgery: Secondary | ICD-10-CM | POA: Diagnosis not present

## 2021-01-19 DIAGNOSIS — I4892 Unspecified atrial flutter: Secondary | ICD-10-CM | POA: Diagnosis not present

## 2021-01-22 ENCOUNTER — Other Ambulatory Visit: Payer: Self-pay | Admitting: Physician Assistant

## 2021-01-22 ENCOUNTER — Other Ambulatory Visit (HOSPITAL_COMMUNITY): Payer: Self-pay

## 2021-01-22 MED ORDER — CYCLOBENZAPRINE HCL 5 MG PO TABS
5.0000 mg | ORAL_TABLET | Freq: Three times a day (TID) | ORAL | 0 refills | Status: DC | PRN
  Filled 2021-01-22: qty 30, 10d supply, fill #0

## 2021-01-22 MED ORDER — OXYCODONE-ACETAMINOPHEN 5-325 MG PO TABS
1.0000 | ORAL_TABLET | Freq: Four times a day (QID) | ORAL | 0 refills | Status: DC | PRN
  Filled 2021-01-22: qty 30, 4d supply, fill #0

## 2021-01-23 ENCOUNTER — Other Ambulatory Visit (HOSPITAL_COMMUNITY): Payer: Self-pay

## 2021-01-23 DIAGNOSIS — E039 Hypothyroidism, unspecified: Secondary | ICD-10-CM | POA: Diagnosis not present

## 2021-01-23 DIAGNOSIS — I4891 Unspecified atrial fibrillation: Secondary | ICD-10-CM | POA: Diagnosis not present

## 2021-01-23 DIAGNOSIS — K219 Gastro-esophageal reflux disease without esophagitis: Secondary | ICD-10-CM | POA: Diagnosis not present

## 2021-01-23 DIAGNOSIS — Z8673 Personal history of transient ischemic attack (TIA), and cerebral infarction without residual deficits: Secondary | ICD-10-CM | POA: Diagnosis not present

## 2021-01-23 DIAGNOSIS — I4892 Unspecified atrial flutter: Secondary | ICD-10-CM | POA: Diagnosis not present

## 2021-01-23 DIAGNOSIS — Z471 Aftercare following joint replacement surgery: Secondary | ICD-10-CM | POA: Diagnosis not present

## 2021-01-23 DIAGNOSIS — I499 Cardiac arrhythmia, unspecified: Secondary | ICD-10-CM | POA: Diagnosis not present

## 2021-01-23 DIAGNOSIS — Z96642 Presence of left artificial hip joint: Secondary | ICD-10-CM | POA: Diagnosis not present

## 2021-01-23 DIAGNOSIS — M1611 Unilateral primary osteoarthritis, right hip: Secondary | ICD-10-CM | POA: Diagnosis not present

## 2021-01-27 DIAGNOSIS — Z96642 Presence of left artificial hip joint: Secondary | ICD-10-CM | POA: Diagnosis not present

## 2021-01-27 DIAGNOSIS — Z471 Aftercare following joint replacement surgery: Secondary | ICD-10-CM | POA: Diagnosis not present

## 2021-01-27 DIAGNOSIS — I4892 Unspecified atrial flutter: Secondary | ICD-10-CM | POA: Diagnosis not present

## 2021-01-27 DIAGNOSIS — I4891 Unspecified atrial fibrillation: Secondary | ICD-10-CM | POA: Diagnosis not present

## 2021-01-27 DIAGNOSIS — I499 Cardiac arrhythmia, unspecified: Secondary | ICD-10-CM | POA: Diagnosis not present

## 2021-01-27 DIAGNOSIS — M1611 Unilateral primary osteoarthritis, right hip: Secondary | ICD-10-CM | POA: Diagnosis not present

## 2021-01-27 DIAGNOSIS — E039 Hypothyroidism, unspecified: Secondary | ICD-10-CM | POA: Diagnosis not present

## 2021-01-27 DIAGNOSIS — K219 Gastro-esophageal reflux disease without esophagitis: Secondary | ICD-10-CM | POA: Diagnosis not present

## 2021-01-27 DIAGNOSIS — Z8673 Personal history of transient ischemic attack (TIA), and cerebral infarction without residual deficits: Secondary | ICD-10-CM | POA: Diagnosis not present

## 2021-01-29 ENCOUNTER — Encounter: Payer: 59 | Admitting: Physician Assistant

## 2021-01-29 ENCOUNTER — Encounter: Payer: Self-pay | Admitting: Physician Assistant

## 2021-01-29 ENCOUNTER — Other Ambulatory Visit (HOSPITAL_COMMUNITY): Payer: Self-pay

## 2021-01-29 ENCOUNTER — Ambulatory Visit: Payer: Self-pay

## 2021-01-29 ENCOUNTER — Ambulatory Visit (INDEPENDENT_AMBULATORY_CARE_PROVIDER_SITE_OTHER): Payer: 59 | Admitting: Physician Assistant

## 2021-01-29 DIAGNOSIS — G8929 Other chronic pain: Secondary | ICD-10-CM

## 2021-01-29 DIAGNOSIS — M1712 Unilateral primary osteoarthritis, left knee: Secondary | ICD-10-CM | POA: Diagnosis not present

## 2021-01-29 DIAGNOSIS — M25562 Pain in left knee: Secondary | ICD-10-CM

## 2021-01-29 DIAGNOSIS — Z96642 Presence of left artificial hip joint: Secondary | ICD-10-CM

## 2021-01-29 MED ORDER — LIDOCAINE HCL 1 % IJ SOLN
5.0000 mL | INTRAMUSCULAR | Status: AC | PRN
  Administered 2021-01-29: 5 mL

## 2021-01-29 MED ORDER — APIXABAN 5 MG PO TABS
5.0000 mg | ORAL_TABLET | Freq: Two times a day (BID) | ORAL | 3 refills | Status: DC
  Filled 2021-01-29: qty 180, 90d supply, fill #0
  Filled 2021-06-02: qty 180, 90d supply, fill #1
  Filled 2021-09-01 – 2021-10-23 (×3): qty 180, 90d supply, fill #2
  Filled 2022-01-06: qty 180, 90d supply, fill #3

## 2021-01-29 MED ORDER — METHYLPREDNISOLONE ACETATE 40 MG/ML IJ SUSP
40.0000 mg | INTRAMUSCULAR | Status: AC | PRN
  Administered 2021-01-29: 40 mg via INTRA_ARTICULAR

## 2021-01-29 MED ORDER — METOPROLOL TARTRATE 25 MG PO TABS
ORAL_TABLET | ORAL | 3 refills | Status: DC
  Filled 2021-01-29: qty 180, 90d supply, fill #0

## 2021-01-29 NOTE — Progress Notes (Signed)
HPI: Keith [name] returns today for follow-up left total hip arthroplasty 12/19/2020.  He states hip is doing well.  He is having left knee pain.  He states he has known arthritis in the knee.  He notes he is received supplemental injections in the knee in the past.  He states that the injections in the past of help.  Review of systems: No fevers, chills, no recent infections.  Physical exam: Left hip good range of motion without pain.  Left calf supple nontender dorsiflexion plantarflexion left ankle intact.  Left knee slight effusion.  No abnormal warmth erythema.  Good range of motion in the knee.  No significant patellofemoral crepitus with passive range of motion of the knee.   Radiographs: Left knee 2 views no acute fractures.  Bone-on-bone medial compartment.  Lateral compartment overall well-preserved.  Moderate patellofemoral changes.  Knee is well located.  Varus deformity.   Impression: Status post left total hip arthroplasty 12/19/2020  Plan: Plan: In regards to his left hip he will continue to work on range of motion strengthening the hip.  After going over the radiographs of his knee today and examining his knee recommend aspiration injection.  He is agreeable with this.  He also would like to have a supplemental injection ordered for his left knee as this is helped in the past.  Questions were encouraged and answered at length.  He will follow-up with [place] once supplemental injections available.    Procedure Note  Patient: Keith [name].             Date of Birth: April 27, 1941           MRN: [id number]             Visit Date: 01/29/2021  Procedures: Visit Diagnoses:  1. Chronic pain of left knee   2. Status post total replacement of left hip   3. Primary osteoarthritis of left knee     Large Joint Inj: L knee on 01/29/2021 5:15 PM Indications: pain Details: 22 G 1.5 in superolateral  Arthrogram: No  Medications: 5 mL lidocaine 1 %; 40 mg methylPREDNISolone acetate 40  MG/ML Aspirate: 24 mL yellow and blood-tinged Outcome: tolerated well, no immediate complications

## 2021-01-30 DIAGNOSIS — K219 Gastro-esophageal reflux disease without esophagitis: Secondary | ICD-10-CM | POA: Diagnosis not present

## 2021-01-30 DIAGNOSIS — I4891 Unspecified atrial fibrillation: Secondary | ICD-10-CM | POA: Diagnosis not present

## 2021-01-30 DIAGNOSIS — Z8673 Personal history of transient ischemic attack (TIA), and cerebral infarction without residual deficits: Secondary | ICD-10-CM | POA: Diagnosis not present

## 2021-01-30 DIAGNOSIS — Z96642 Presence of left artificial hip joint: Secondary | ICD-10-CM | POA: Diagnosis not present

## 2021-01-30 DIAGNOSIS — I4892 Unspecified atrial flutter: Secondary | ICD-10-CM | POA: Diagnosis not present

## 2021-01-30 DIAGNOSIS — M1611 Unilateral primary osteoarthritis, right hip: Secondary | ICD-10-CM | POA: Diagnosis not present

## 2021-01-30 DIAGNOSIS — Z471 Aftercare following joint replacement surgery: Secondary | ICD-10-CM | POA: Diagnosis not present

## 2021-01-30 DIAGNOSIS — E039 Hypothyroidism, unspecified: Secondary | ICD-10-CM | POA: Diagnosis not present

## 2021-01-30 DIAGNOSIS — I499 Cardiac arrhythmia, unspecified: Secondary | ICD-10-CM | POA: Diagnosis not present

## 2021-02-02 ENCOUNTER — Other Ambulatory Visit (HOSPITAL_COMMUNITY): Payer: Self-pay

## 2021-02-27 ENCOUNTER — Telehealth: Payer: Self-pay

## 2021-02-27 NOTE — Telephone Encounter (Signed)
Approved for Durolane-left knee Buy and bill Dr. Ninfa Linden No copay Covered @ 100% No prior auth required

## 2021-02-27 NOTE — Telephone Encounter (Signed)
Submitted for VOB for Durolane-left knee

## 2021-03-05 ENCOUNTER — Encounter: Payer: Self-pay | Admitting: Physician Assistant

## 2021-03-05 ENCOUNTER — Ambulatory Visit (INDEPENDENT_AMBULATORY_CARE_PROVIDER_SITE_OTHER): Payer: 59 | Admitting: Physician Assistant

## 2021-03-05 ENCOUNTER — Other Ambulatory Visit: Payer: Self-pay

## 2021-03-05 ENCOUNTER — Other Ambulatory Visit (HOSPITAL_COMMUNITY): Payer: Self-pay

## 2021-03-05 DIAGNOSIS — G8929 Other chronic pain: Secondary | ICD-10-CM

## 2021-03-05 DIAGNOSIS — M1712 Unilateral primary osteoarthritis, left knee: Secondary | ICD-10-CM | POA: Diagnosis not present

## 2021-03-05 DIAGNOSIS — M545 Low back pain, unspecified: Secondary | ICD-10-CM

## 2021-03-05 DIAGNOSIS — Z96642 Presence of left artificial hip joint: Secondary | ICD-10-CM

## 2021-03-05 MED ORDER — OXYCODONE-ACETAMINOPHEN 5-325 MG PO TABS
1.0000 | ORAL_TABLET | Freq: Four times a day (QID) | ORAL | 0 refills | Status: DC | PRN
  Filled 2021-03-05: qty 20, 3d supply, fill #0

## 2021-03-05 NOTE — Addendum Note (Signed)
Addended by: Robyne Peers on: 03/05/2021 04:33 PM   Modules accepted: Orders

## 2021-03-05 NOTE — Progress Notes (Signed)
   Procedure Note  Patient: Keith [name].             Date of Birth: 22-Feb-1941           MRN: [id number]             Visit Date: 03/05/2021  HPI: Mr. Zunich returns today for supplemental injection left knee.  He has known osteoarthritis left knee.  Radiographs of the left knee show bone-on-bone medial compartment and lateral compartment the knee with well-preserved.  Moderate patellofemoral changes.  He has no upcoming surgery on the left knee in the next 6 months.  Past supplemental injections in the left knee were helpful.  Physical exam: Left knee good range of motion.  No abnormal warmth erythema.  Positive effusion.  Crepitus with passive range of motion.  Procedures: Visit Diagnoses:  1. Primary osteoarthritis of left knee     Large Joint Inj: L knee on 03/05/2021 4:17 PM Indications: pain Details: 22 G 1.5 in needle, anterolateral approach  Arthrogram: No  Aspirate: 32 mL yellow Outcome: tolerated well, no immediate complications Procedure, treatment alternatives, risks and benefits explained, specific risks discussed. Consent was given by the patient. Immediately prior to procedure a time out was called to verify the correct patient, procedure, equipment, support staff and site/side marked as required. Patient was prepped and draped in the usual sterile fashion.   Plan: We will see him back in 1 week for the second of 3 Gelsyn injections.  Patient did ask about pain medication.  Has been taking oxycodone for his back knee and hip pain.  Did discuss with him is not appropriate for [place] to have him on pain medications long-term.  Therefore we will refer him to pain management.  Did refill his oxycodone today he is to use this currently.  Questions encouraged and answered.

## 2021-03-06 ENCOUNTER — Telehealth: Payer: Self-pay

## 2021-03-06 NOTE — Telephone Encounter (Signed)
Approved for [employer] and bill Covered @ 100% No copay No prior auth required

## 2021-03-11 ENCOUNTER — Other Ambulatory Visit: Payer: Self-pay

## 2021-03-11 ENCOUNTER — Ambulatory Visit (INDEPENDENT_AMBULATORY_CARE_PROVIDER_SITE_OTHER): Payer: 59 | Admitting: Orthopaedic Surgery

## 2021-03-11 ENCOUNTER — Other Ambulatory Visit (HOSPITAL_COMMUNITY): Payer: Self-pay

## 2021-03-11 DIAGNOSIS — R351 Nocturia: Secondary | ICD-10-CM | POA: Diagnosis not present

## 2021-03-11 DIAGNOSIS — N200 Calculus of kidney: Secondary | ICD-10-CM | POA: Diagnosis not present

## 2021-03-11 DIAGNOSIS — M1612 Unilateral primary osteoarthritis, left hip: Secondary | ICD-10-CM | POA: Diagnosis not present

## 2021-03-11 DIAGNOSIS — N401 Enlarged prostate with lower urinary tract symptoms: Secondary | ICD-10-CM | POA: Diagnosis not present

## 2021-03-11 DIAGNOSIS — R3121 Asymptomatic microscopic hematuria: Secondary | ICD-10-CM | POA: Diagnosis not present

## 2021-03-11 MED ORDER — TAMSULOSIN HCL 0.4 MG PO CAPS
ORAL_CAPSULE | ORAL | 3 refills | Status: DC
  Filled 2021-03-11: qty 90, 90d supply, fill #0
  Filled 2021-06-02: qty 90, 90d supply, fill #1
  Filled 2021-09-01: qty 90, 90d supply, fill #2
  Filled 2021-12-09: qty 90, 90d supply, fill #3

## 2021-03-11 MED ORDER — SODIUM HYALURONATE (VISCOSUP) 16.8 MG/2ML IX SOSY
16.8000 mg | PREFILLED_SYRINGE | INTRA_ARTICULAR | Status: AC | PRN
  Administered 2021-03-11: 16.8 mg via INTRA_ARTICULAR

## 2021-03-11 NOTE — Progress Notes (Signed)
   Procedure Note  Patient: Keith [name].             Date of Birth: 02/12/1941           MRN: [id number]             Visit Date: 03/11/2021 HPI: Mr. Degrand returns today for his second hyaluronic injection.  He states he tolerated last injection well.  He has no complaints.  Has had no new injuries.  Physical exam: Left knee no abnormal warmth erythema.  Slight effusion.  Ambulates with a slight antalgic gait and the use of a cane.  Procedures: Visit Diagnoses:  1. Primary osteoarthritis of left hip     Large Joint Inj: L knee on 03/11/2021 10:15 AM Indications: pain Details: 22 G 1.5 in needle, superolateral approach  Arthrogram: No  Medications: 16.8 mg Sodium Hyaluronate (Viscosup) 16.8 MG/2ML Outcome: tolerated well, no immediate complications Procedure, treatment alternatives, risks and benefits explained, specific risks discussed. Consent was given by the patient. Immediately prior to procedure a time out was called to verify the correct patient, procedure, equipment, support staff and site/side marked as required. Patient was prepped and draped in the usual sterile fashion.    Plan: He will follow-up with [place] in a week for his third Gelsyn injection.  Patient tolerated injection well today

## 2021-03-19 ENCOUNTER — Encounter: Payer: Self-pay | Admitting: Physician Assistant

## 2021-03-19 ENCOUNTER — Ambulatory Visit (INDEPENDENT_AMBULATORY_CARE_PROVIDER_SITE_OTHER): Payer: 59 | Admitting: Physician Assistant

## 2021-03-19 VITALS — Ht 71.0 in | Wt 172.0 lb

## 2021-03-19 DIAGNOSIS — M1712 Unilateral primary osteoarthritis, left knee: Secondary | ICD-10-CM | POA: Diagnosis not present

## 2021-03-19 MED ORDER — SODIUM HYALURONATE (VISCOSUP) 16.8 MG/2ML IX SOSY
16.8000 mg | PREFILLED_SYRINGE | INTRA_ARTICULAR | Status: AC | PRN
  Administered 2021-03-19: 16.8 mg via INTRA_ARTICULAR

## 2021-03-19 NOTE — Progress Notes (Signed)
   Procedure Note  Patient: Keith [name].             Date of Birth: [birth year]           MRN: [id number]             Visit Date: 03/19/2021 HPI: Mr. Keith [name] returns today for his third Gelsyn injection.  He states he does feel the knee is better since having the first 2 injections.  He has had no adverse effects from the injections.  Physical exam: Left knee no abnormal warmth erythema.  Positive effusion. Procedures: Visit Diagnoses:  1. Primary osteoarthritis of left knee     Large Joint Inj on 03/19/2021 3:52 PM Indications: pain Details: 22 G 1.5 in needle, anterolateral approach  Arthrogram: No  Medications: 16.8 mg Sodium Hyaluronate (Viscosup) 16.8 MG/2ML Aspirate: 35 mL yellow Outcome: tolerated well, no immediate complications Procedure, treatment alternatives, risks and benefits explained, specific risks discussed. Consent was given by the patient. Immediately prior to procedure a time out was called to verify the correct patient, procedure, equipment, support staff and site/side marked as required. Patient was prepped and draped in the usual sterile fashion.     Plan: We will see him back in 8 weeks to see how he is doing overall in regards to the left knee.  Questions were encouraged and answered at length today.

## 2021-03-20 ENCOUNTER — Other Ambulatory Visit: Payer: Self-pay

## 2021-03-20 ENCOUNTER — Ambulatory Visit
Admission: EM | Admit: 2021-03-20 | Discharge: 2021-03-20 | Disposition: A | Discharge status: Home / Self Care | Payer: 59 | Attending: Emergency Medicine | Admitting: Emergency Medicine

## 2021-03-20 ENCOUNTER — Encounter: Payer: Self-pay | Admitting: Emergency Medicine

## 2021-03-20 ENCOUNTER — Ambulatory Visit: Payer: 59

## 2021-03-20 ENCOUNTER — Other Ambulatory Visit (HOSPITAL_COMMUNITY): Payer: Self-pay

## 2021-03-20 DIAGNOSIS — R030 Elevated blood-pressure reading, without diagnosis of hypertension: Secondary | ICD-10-CM

## 2021-03-20 DIAGNOSIS — M7989 Other specified soft tissue disorders: Secondary | ICD-10-CM

## 2021-03-20 MED ORDER — LISINOPRIL-HYDROCHLOROTHIAZIDE 10-12.5 MG PO TABS
1.0000 | ORAL_TABLET | Freq: Every day | ORAL | 0 refills | Status: DC
  Filled 2021-03-20: qty 30, 30d supply, fill #0

## 2021-03-20 NOTE — Discharge Instructions (Addendum)
Blood pressure elevated in office Lisinopril HCTZ prescribed Please continue to monitor blood pressure at home and keep a log Eat a well balanced diet of fruits, vegetables and lean meats.  Avoid foods high in fat and salt Drink water.  At least half your body weight in ounces Exercise for at least 30 minutes daily Follow up with PCP for further evaluation and management Return or go to the ED if you have any new or worsening symptoms such as vision changes, fatigue, dizziness, chest pain, shortness of breath, nausea, swelling in your hands or feet, urinary symptoms, etc..Marland Kitchen

## 2021-03-20 NOTE — ED Provider Notes (Signed)
[place]   [id number] 03/20/21 Arrival Time: 0828  [id number]  SUBJECTIVE:  Keith [name]. is a 80 y.o. male who presents for blood pressure check. States blood pressure has been elevated over the past week.  Was 190/80 earlier this week.  Denies hx of HTN.  Does not take medication for HTN, but recently stopped metoprolol due to decreased heart rate.  Does not have a PCP.  Denies HA, vision changes, dizziness, lightheadedness, chest pain, shortness of breath, numbness or tingling in extremities, abdominal pain, changes in bowel or bladder habits.     Patient states recent blood work for kidney function was within normal limits  ROS: As per HPI.  All other pertinent ROS negative.     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   Dysrhythmia    GERD (gastroesophageal reflux disease)    History of aortic valve replacement with bioprosthetic valve    Aortic regurgitation   History of chicken pox    History of kidney stones    History of skin cancer    History of stroke 2011   Hypothyroidism    Kidney stone    Seasonal allergies    Stroke [facility])    after heart surgery 2010   Past Surgical History:  Procedure Laterality Date   AORTIC VALVE REPLACEMENT  2009   Bioprosthetic AVR and root replacement   BACK SURGERY     total of 4 back surgeries   CATARACT EXTRACTION, BILATERAL     COLONOSCOPY     CYSTOSCOPY WITH RETROGRADE PYELOGRAM, URETEROSCOPY AND STENT PLACEMENT Bilateral 10/01/2019   Procedure: CYSTOSCOPY WITH RETROGRADE PYELOGRAM, URETEROSCOPY AND STENT PLACEMENT;  Surgeon: Irine Seal, MD;  Location: [facility];  Service: Urology;  Laterality: Bilateral;   EPIGASTRIC HERNIA REPAIR     HERNIA REPAIR     LUMBAR LAMINECTOMY/DECOMPRESSION MICRODISCECTOMY Right  06/01/2017   Procedure: EXTRAFORAMINAL MICRODISCECTOMY LUMBAR FIVE- SACRAL ONE, RIGHT;  Surgeon: Eustace Moore, MD;  Location: [place];  Service: Neurosurgery;  Laterality: Right;   LUMBAR LAMINECTOMY/DECOMPRESSION MICRODISCECTOMY Bilateral 09/04/2018   Procedure: Laminectomy and Foraminotomy - Lumbar two-three bilateral;  Surgeon: Eustace Moore, MD;  Location: [place];  Service: Neurosurgery;  Laterality: Bilateral;   TOTAL HIP ARTHROPLASTY Left 12/19/2020   Procedure: LEFT TOTAL HIP ARTHROPLASTY ANTERIOR APPROACH;  Surgeon: Mcarthur Rossetti, MD;  Location: [facility];  Service: Orthopedics;  Laterality: Left;   TRANSFORAMINAL LUMBAR INTERBODY FUSION (TLIF) WITH PEDICLE SCREW FIXATION 1 LEVEL Left 06/15/2019   Procedure: Decompression and Instrumention Fusion Lumbar Four- Five;  Surgeon: Eustace Moore, MD;  Location: [place];  Service: Neurosurgery;  Laterality: Left;  Decompression and Instrumention Fusion Lumbar Four- Five   Allergies  Allergen Reactions   Tape    No current facility-administered medications on file prior to encounter.   Current Outpatient Medications on File Prior to Encounter  Medication Sig Dispense Refill   apixaban (ELIQUIS) 5 MG TABS tablet Take 1 tablet (5 mg total) by mouth 2 (two) times daily. 180 tablet 3   cyclobenzaprine (FLEXERIL) 5 MG tablet Take 1 tablet (5 mg total) by mouth 3 (three) times daily as needed for muscle spasms. 30 tablet 0   ELIQUIS 5 MG TABS tablet Take 5 mg by mouth  2 (two) times daily.     famotidine (PEPCID) 20 MG tablet Take 20 mg by mouth 2 (two) times daily as needed for heartburn or indigestion.     Glucos-MSM-C-Mn-Ginger-Willow (GLUCOSAMINE MSM COMPLEX) TABS Take 1 tablet by mouth at bedtime.      levothyroxine (SYNTHROID) 50 MCG tablet TAKE 1 TABLET BY MOUTH ONCE A DAY BEFORE BREAKFAST 90 tablet 1   metoprolol tartrate (LOPRESSOR) 25 MG tablet Take 25 mg by mouth daily.      metoprolol tartrate (LOPRESSOR) 25 MG tablet Take 25 mg by  mouth 2 (two) times daily.     oxyCODONE-acetaminophen (PERCOCET/ROXICET) 5-325 MG tablet Take 1 - 2 tablets by mouth every 6 hours as needed for severe pain. 20 tablet 0   pantoprazole (PROTONIX) 40 MG tablet TAKE 1 TABLET BY MOUTH ONCE DAILY (Patient taking differently: Take 40 mg by mouth daily.) 30 tablet 3   tamsulosin (FLOMAX) 0.4 MG CAPS capsule Take 1 capsule by mouth once daily 90 capsule 3   Social History   Socioeconomic History   Marital status: Married    Spouse name: Arbie Cookey   Number of children: 1   Years of education: Not on file   Highest education level: Not on file  Occupational History   Occupation: Chief Strategy Officer  Tobacco Use   Smoking status: Never   Smokeless tobacco: Never  Vaping Use   Vaping Use: Never used  Substance and Sexual Activity   Alcohol use: Yes    Comment: occasional   Drug use: No   Sexual activity: Yes  Other Topics Concern   Not on file  Social History Narrative   Not on file   Social Determinants of Health   Financial Resource Strain: Not on file  Food Insecurity: Not on file  Transportation Needs: Not on file  Physical Activity: Not on file  Stress: Not on file  Social Connections: Not on file  Intimate Partner Violence: Not on file   Family History  Problem Relation Age of Onset   Colon cancer Neg Hx    Esophageal cancer Neg Hx    Rectal cancer Neg Hx    Stomach cancer Neg Hx     OBJECTIVE:  Vitals:   03/20/21 0907  BP: (!) 173/82  Pulse: (!) 56  Resp: 19  Temp: 98.1 F (36.7 C)  TempSrc: Oral  SpO2: 94%  Weight: 200 lb (90.7 kg)    General appearance: alert; no distress Eyes: PERRLA; EOMI HENT: normocephalic; atraumatic Neck: supple with FROM Lungs: clear to auscultation bilaterally Heart: Murmur present Skin: warm and dry Extremities: 1+ pitting edema upward to proximal tib/ fib Neurologic: CN 2-12 grossly intact; normal gait Psychological: alert and cooperative; normal mood and affect   ASSESSMENT &  PLAN:  1. Elevated blood pressure reading   2. Leg swelling     Meds ordered this encounter  Medications   lisinopril-hydrochlorothiazide (ZESTORETIC) 10-12.5 MG tablet    Sig: Take 1 tablet by mouth daily.    Dispense:  30 tablet    Refill:  0    Order Specific Question:   Supervising Provider    Answer:   Raylene Everts [id number]   Blood pressure elevated in office Lisinopril HCTZ prescribed Please continue to monitor blood pressure at home and keep a log Eat a well balanced diet of fruits, vegetables and lean meats.  Avoid foods high in fat and salt Drink water.  At least half your body weight in ounces Exercise for at least  30 minutes daily Follow up with PCP for further evaluation and management Return or go to the ED if you have any new or worsening symptoms such as vision changes, fatigue, dizziness, chest pain, shortness of breath, nausea, swelling in your hands or feet, urinary symptoms, etc...   Reviewed expectations re: course of current medical issues. Questions answered. Outlined signs and symptoms indicating need for more acute intervention. Patient verbalized understanding. After Visit Summary given.    Lestine Box, PA-C 03/20/21 [phone]

## 2021-03-20 NOTE — ED Triage Notes (Signed)
He has had increased blood pressure for about 1 week and bilateral leg swelling.

## 2021-03-31 DIAGNOSIS — N200 Calculus of kidney: Secondary | ICD-10-CM | POA: Diagnosis not present

## 2021-04-16 ENCOUNTER — Other Ambulatory Visit (HOSPITAL_COMMUNITY): Payer: Self-pay | Admitting: Student

## 2021-04-16 ENCOUNTER — Other Ambulatory Visit (HOSPITAL_BASED_OUTPATIENT_CLINIC_OR_DEPARTMENT_OTHER): Payer: Self-pay | Admitting: Student

## 2021-04-16 DIAGNOSIS — M5412 Radiculopathy, cervical region: Secondary | ICD-10-CM

## 2021-04-16 DIAGNOSIS — Z6826 Body mass index (BMI) 26.0-26.9, adult: Secondary | ICD-10-CM | POA: Diagnosis not present

## 2021-04-16 DIAGNOSIS — I10 Essential (primary) hypertension: Secondary | ICD-10-CM | POA: Diagnosis not present

## 2021-04-29 ENCOUNTER — Ambulatory Visit (HOSPITAL_BASED_OUTPATIENT_CLINIC_OR_DEPARTMENT_OTHER): Payer: 59 | Admitting: Family Medicine

## 2021-04-29 ENCOUNTER — Ambulatory Visit (HOSPITAL_COMMUNITY)
Admission: RE | Admit: 2021-04-29 | Discharge: 2021-04-29 | Disposition: A | Discharge status: Home / Self Care | Payer: 59 | Source: Ambulatory Visit | Attending: Student | Admitting: Student

## 2021-04-29 ENCOUNTER — Other Ambulatory Visit: Payer: Self-pay

## 2021-04-29 DIAGNOSIS — I10 Essential (primary) hypertension: Secondary | ICD-10-CM | POA: Diagnosis not present

## 2021-04-29 DIAGNOSIS — Z6826 Body mass index (BMI) 26.0-26.9, adult: Secondary | ICD-10-CM | POA: Diagnosis not present

## 2021-04-29 DIAGNOSIS — M4802 Spinal stenosis, cervical region: Secondary | ICD-10-CM | POA: Diagnosis not present

## 2021-04-29 DIAGNOSIS — G5601 Carpal tunnel syndrome, right upper limb: Secondary | ICD-10-CM | POA: Diagnosis not present

## 2021-04-29 DIAGNOSIS — M5011 Cervical disc disorder with radiculopathy,  high cervical region: Secondary | ICD-10-CM | POA: Diagnosis not present

## 2021-04-29 DIAGNOSIS — M5412 Radiculopathy, cervical region: Secondary | ICD-10-CM | POA: Diagnosis not present

## 2021-04-29 DIAGNOSIS — M50123 Cervical disc disorder at C6-C7 level with radiculopathy: Secondary | ICD-10-CM | POA: Diagnosis not present

## 2021-04-29 DIAGNOSIS — M50121 Cervical disc disorder at C4-C5 level with radiculopathy: Secondary | ICD-10-CM | POA: Diagnosis not present

## 2021-05-06 ENCOUNTER — Other Ambulatory Visit (HOSPITAL_COMMUNITY): Payer: Self-pay

## 2021-05-06 MED ORDER — TRAMADOL HCL 50 MG PO TABS
ORAL_TABLET | ORAL | 0 refills | Status: DC
  Filled 2021-05-06: qty 30, 8d supply, fill #0

## 2021-05-13 ENCOUNTER — Other Ambulatory Visit (HOSPITAL_COMMUNITY): Payer: Self-pay

## 2021-05-13 DIAGNOSIS — N400 Enlarged prostate without lower urinary tract symptoms: Secondary | ICD-10-CM | POA: Diagnosis not present

## 2021-05-13 DIAGNOSIS — G8929 Other chronic pain: Secondary | ICD-10-CM | POA: Insufficient documentation

## 2021-05-13 DIAGNOSIS — I10 Essential (primary) hypertension: Secondary | ICD-10-CM | POA: Diagnosis not present

## 2021-05-13 DIAGNOSIS — E039 Hypothyroidism, unspecified: Secondary | ICD-10-CM | POA: Diagnosis not present

## 2021-05-13 DIAGNOSIS — M1990 Unspecified osteoarthritis, unspecified site: Secondary | ICD-10-CM | POA: Diagnosis not present

## 2021-05-13 DIAGNOSIS — Z8673 Personal history of transient ischemic attack (TIA), and cerebral infarction without residual deficits: Secondary | ICD-10-CM | POA: Diagnosis not present

## 2021-05-13 DIAGNOSIS — I2510 Atherosclerotic heart disease of native coronary artery without angina pectoris: Secondary | ICD-10-CM | POA: Insufficient documentation

## 2021-05-13 DIAGNOSIS — C4491 Basal cell carcinoma of skin, unspecified: Secondary | ICD-10-CM | POA: Diagnosis not present

## 2021-05-13 DIAGNOSIS — Z954 Presence of other heart-valve replacement: Secondary | ICD-10-CM | POA: Diagnosis not present

## 2021-05-13 DIAGNOSIS — M545 Low back pain, unspecified: Secondary | ICD-10-CM | POA: Diagnosis not present

## 2021-05-13 DIAGNOSIS — Z87442 Personal history of urinary calculi: Secondary | ICD-10-CM | POA: Diagnosis not present

## 2021-05-13 MED ORDER — LISINOPRIL-HYDROCHLOROTHIAZIDE 10-12.5 MG PO TABS
ORAL_TABLET | ORAL | 2 refills | Status: DC
  Filled 2021-05-13: qty 90, 90d supply, fill #0
  Filled 2021-07-30: qty 90, 90d supply, fill #1
  Filled 2021-10-23: qty 90, 90d supply, fill #2

## 2021-05-13 MED ORDER — LEVOTHYROXINE SODIUM 50 MCG PO TABS
ORAL_TABLET | ORAL | 2 refills | Status: DC
  Filled 2021-05-13: qty 90, 90d supply, fill #0
  Filled 2021-07-30: qty 90, 90d supply, fill #1
  Filled 2021-10-23: qty 90, 90d supply, fill #2

## 2021-05-14 ENCOUNTER — Ambulatory Visit: Payer: Self-pay

## 2021-05-14 ENCOUNTER — Ambulatory Visit (INDEPENDENT_AMBULATORY_CARE_PROVIDER_SITE_OTHER): Payer: 59 | Admitting: Physician Assistant

## 2021-05-14 DIAGNOSIS — M1611 Unilateral primary osteoarthritis, right hip: Secondary | ICD-10-CM | POA: Diagnosis not present

## 2021-05-14 DIAGNOSIS — M1712 Unilateral primary osteoarthritis, left knee: Secondary | ICD-10-CM

## 2021-05-14 DIAGNOSIS — M25512 Pain in left shoulder: Secondary | ICD-10-CM

## 2021-05-14 NOTE — Progress Notes (Signed)
Office Visit Note   Patient: Keith [name].           Date of Birth: [birth year]           MRN: [id number] Visit Date: 05/14/2021              Requested by: No referring provider defined for this encounter. PCP: Pcp, No   Assessment & Plan: Visit Diagnoses:  1. Acute pain of left shoulder   2. Primary osteoarthritis of right hip   3. Primary osteoarthritis of left knee     Plan: Patient reports that he is going to have surgery in the near future with Dr. Ronnald Ramp due to the numbness tingling in his right hand.  He would like some type of relief in regards to his right hip and his left shoulder pain.  Therefore we will send him for an intra-articular injection of both the left shoulder and right hip.  Having follow-up 4 weeks after the injection and see what type of response he had.  Questions were encouraged and answered.  Follow-Up Instructions: Return 4 weeks after intra-articular injections.   Orders:  Orders Placed This Encounter  Procedures   XR Shoulder Left   No orders of the defined types were placed in this encounter.     Procedures: No procedures performed   Clinical Data: No additional findings.   Subjective: Chief Complaint  Patient presents with   Left Knee - Follow-up    Left knee osteoarthritis S/p gelsyn inj 03/19/21    HPI Keith [name] comes in today status post left knee supplemental injection on 03/19/2021.  He has known bone-on-bone arthritis of the left knee.  He states that the supplemental injection gave him no relief.  He is also having right hip pain and left shoulder pain. By MRI he has moderate right hip joint space narrowing and diffuse cartilage thinning.  He has had left total hip arthroplasty in the past is doing well. Regards to his left shoulder he has had no acute injury reports an injury some 10 years ago to the left shoulder on a Therapist, music.  He is having decreased range of motion of the left shoulder and significant pain in  the left shoulder.  He is asking [place] to look at this today also.  He is nondiabetic. Review of Systems Denies any fevers or chills.  Objective: Vital Signs: There were no vitals taken for this visit.  Physical Exam General well-developed well-nourished male no acute distress mood and affect appropriate. Ortho Exam Bilateral shoulders he has 5 5 strength of external/internal rotation resistance.  Severe limitation of overhead activity left shoulder and limited external rotation attempts are very painful left shoulder with external rotation.  There is significant crepitus with internal and external rotation of the left shoulder. Right hip he has overall good range of motion but has pain with extremes of external and internal rotation. Specialty Comments:  No specialty comments available.  Imaging: XR Shoulder Left  Result Date: 05/14/2021 Left shoulder : Multiple views shows end-stage arthritis of the left shoulder with flattening of the humeral head.  No acute fractures.    PMFS History: Patient Active Problem List   Diagnosis Date Noted   Status post left hip replacement 12/19/2020   Primary osteoarthritis of left hip 12/04/2020   Hyperlipidemia 04/30/2020   Cataract 10/10/2019   Right ureteral stone 10/01/2019   Left ureteral stone 10/01/2019   S/P lumbar fusion 06/15/2019   Acquired hypothyroidism 08/10/2017  Benign prostatic hyperplasia with nocturia 08/10/2017   S/P lumbar laminectomy 06/01/2017   PAF (paroxysmal atrial fibrillation) ([place]) 09/07/2016   S/P AVR 01/18/2014   Hypertension 02/22/2013   Past Medical History:  Diagnosis Date   Arthritis    Atrial fibrillation [facility])    Cardioversion 2016   Atrial flutter ([place])    Cardioversion 2013   Blood transfusion without reported diagnosis    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)    after heart surgery 2010    Family History  Problem Relation Age of Onset   Colon cancer Neg Hx    Esophageal cancer Neg Hx    Rectal cancer Neg Hx    Stomach cancer Neg Hx     Past Surgical History:  Procedure Laterality Date   AORTIC VALVE REPLACEMENT  2009   Bioprosthetic AVR and root replacement   BACK SURGERY     total of 4 back surgeries   CATARACT EXTRACTION, BILATERAL     COLONOSCOPY     CYSTOSCOPY WITH RETROGRADE PYELOGRAM, URETEROSCOPY AND STENT PLACEMENT Bilateral 10/01/2019   Procedure: CYSTOSCOPY WITH RETROGRADE PYELOGRAM, URETEROSCOPY AND STENT PLACEMENT;  Surgeon: Irine Seal, MD;  Location: [facility];  Service: Urology;  Laterality: Bilateral;   EPIGASTRIC HERNIA REPAIR     HERNIA REPAIR     LUMBAR LAMINECTOMY/DECOMPRESSION MICRODISCECTOMY Right 06/01/2017   Procedure: EXTRAFORAMINAL MICRODISCECTOMY LUMBAR FIVE- SACRAL ONE, RIGHT;  Surgeon: Eustace Moore, MD;  Location: [place];  Service: Neurosurgery;  Laterality: Right;   LUMBAR LAMINECTOMY/DECOMPRESSION MICRODISCECTOMY Bilateral 09/04/2018   Procedure: Laminectomy and Foraminotomy - Lumbar two-three bilateral;  Surgeon: Eustace Moore, MD;  Location: [place];  Service: Neurosurgery;  Laterality: Bilateral;   TOTAL HIP ARTHROPLASTY Left 12/19/2020   Procedure: LEFT TOTAL HIP ARTHROPLASTY ANTERIOR APPROACH;  Surgeon: Mcarthur Rossetti, MD;  [facility];  Service: Orthopedics;  Laterality: Left;   TRANSFORAMINAL LUMBAR INTERBODY FUSION (TLIF) WITH PEDICLE SCREW FIXATION 1 LEVEL Left 06/15/2019   Procedure: Decompression and Instrumention Fusion Lumbar Four- Five;  Surgeon: Eustace Moore, MD;  Location: [place];  Service: Neurosurgery;  Laterality: Left;  Decompression and Instrumention Fusion Lumbar Four- Five   Social  History   Occupational History   Occupation: Chief Strategy Officer  Tobacco Use   Smoking status: Never   Smokeless tobacco: Never  Vaping Use   Vaping Use: Never used  Substance and Sexual Activity   Alcohol use: Yes    Comment: occasional   Drug use: No   Sexual activity: Yes

## 2021-05-18 DIAGNOSIS — G5601 Carpal tunnel syndrome, right upper limb: Secondary | ICD-10-CM | POA: Diagnosis not present

## 2021-05-18 DIAGNOSIS — M4802 Spinal stenosis, cervical region: Secondary | ICD-10-CM | POA: Diagnosis not present

## 2021-05-18 NOTE — Addendum Note (Signed)
Addended by: Robyne Peers on: 05/18/2021 09:15 AM   Modules accepted: Orders

## 2021-05-21 ENCOUNTER — Other Ambulatory Visit (HOSPITAL_COMMUNITY): Payer: Self-pay

## 2021-05-21 DIAGNOSIS — G5601 Carpal tunnel syndrome, right upper limb: Secondary | ICD-10-CM | POA: Diagnosis not present

## 2021-05-21 MED ORDER — OXYCODONE-ACETAMINOPHEN 5-325 MG PO TABS
ORAL_TABLET | ORAL | 0 refills | Status: DC
  Filled 2021-05-21: qty 20, 5d supply, fill #0

## 2021-05-22 ENCOUNTER — Telehealth: Payer: Self-pay | Admitting: Physical Medicine and Rehabilitation

## 2021-05-22 NOTE — Telephone Encounter (Signed)
Pt returned call to [place]. Please call pt at [phone].

## 2021-05-25 ENCOUNTER — Telehealth: Payer: Self-pay | Admitting: Physical Medicine and Rehabilitation

## 2021-05-25 NOTE — Telephone Encounter (Signed)
Scheduled

## 2021-05-25 NOTE — Telephone Encounter (Signed)
Pt called returning [place] call and states he will be available all day for a CB.   [phone]

## 2021-05-27 ENCOUNTER — Ambulatory Visit: Payer: Self-pay

## 2021-05-27 ENCOUNTER — Ambulatory Visit (INDEPENDENT_AMBULATORY_CARE_PROVIDER_SITE_OTHER): Payer: 59 | Admitting: Physical Medicine and Rehabilitation

## 2021-05-27 ENCOUNTER — Other Ambulatory Visit: Payer: Self-pay

## 2021-05-27 ENCOUNTER — Encounter: Payer: Self-pay | Admitting: Physical Medicine and Rehabilitation

## 2021-05-27 DIAGNOSIS — M25551 Pain in right hip: Secondary | ICD-10-CM

## 2021-05-27 DIAGNOSIS — M25512 Pain in left shoulder: Secondary | ICD-10-CM | POA: Diagnosis not present

## 2021-05-27 NOTE — Progress Notes (Signed)
Pt state left shoulder and right hip pain. Pt state any movement makes the pain worse. Pt state he takes over the counter pain meds and uses ice to help ease his pain.  Numeric Pain Rating Scale and Functional Assessment Average Pain 6   In the last MONTH (on 0-10 scale) has pain interfered with the following?  1. General activity like being  able to carry out your everyday physical activities such as walking, climbing stairs, carrying groceries, or moving a chair?  Rating(10)   +BT, -Dye Allergies.

## 2021-05-27 NOTE — Progress Notes (Signed)
   Keith [name]. - 80 y.o. male MRN [id number]  Date of birth: 1941/04/25  Office Visit Note: Visit Date: 05/27/2021 PCP: Pcp, No Referred by: Pete Pelt, PA-C  Subjective: Chief Complaint  Patient presents with   Left Shoulder - Pain   Right Hip - Pain   HPI:  Keith [name]. is a 80 y.o. male who comes in today at the request of Dr. Jean Rosenthal for planned Right anesthetic hip arthrogram and Left glenohumeral joint injection with fluoroscopic guidance.  The patient has failed conservative care including home exercise, medications, time and activity modification.  This injection will be diagnostic and hopefully therapeutic.  Please see requesting physician notes for further details and justification.   ROS Otherwise per HPI.  Assessment & Plan: Visit Diagnoses:    ICD-10-CM   1. Left shoulder pain, unspecified chronicity  M25.512 XR C-ARM NO REPORT    Large Joint Inj: R hip joint    Large Joint Inj: L glenohumeral    2. Pain in right hip  M25.551 XR C-ARM NO REPORT    Large Joint Inj: R hip joint    Large Joint Inj: L glenohumeral      Plan: No additional findings.   Meds & Orders: No orders of the defined types were placed in this encounter.   Orders Placed This Encounter  Procedures   Large Joint Inj: R hip joint   Large Joint Inj: L glenohumeral   XR C-ARM NO REPORT    Follow-up: Return for visit to requesting provider as needed.   Procedures: Large Joint Inj: R hip joint on 05/27/2021 8:30 AM Indications: diagnostic evaluation and pain Details: 22 G 3.5 in needle, fluoroscopy-guided anterior approach  Arthrogram: No  Medications: 4 mL bupivacaine 0.25 %; 60 mg triamcinolone acetonide 40 MG/ML Outcome: tolerated well, no immediate complications  There was excellent flow of contrast producing a partial arthrogram of the hip. The patient did have relief of symptoms during the anesthetic phase of the injection. Procedure, treatment  alternatives, risks and benefits explained, specific risks discussed. Consent was given by the patient. Immediately prior to procedure a time out was called to verify the correct patient, procedure, equipment, support staff and site/side marked as required. Patient was prepped and draped in the usual sterile fashion.    Large Joint Inj: L glenohumeral on 05/27/2021 8:30 AM Indications: pain and diagnostic evaluation Details: 22 G 3.5 in needle, fluoroscopy-guided anteromedial approach  Arthrogram: No  Medications: 3 mL bupivacaine 0.5 %; 40 mg triamcinolone acetonide 40 MG/ML; 5 mL bupivacaine 0.25 % Outcome: tolerated well, no immediate complications  There was excellent flow of contrast producing a partial arthrogram of the glenohumeral joint. The patient did have relief of symptoms during the anesthetic phase of the injection. Procedure, treatment alternatives, risks and benefits explained, specific risks discussed. Consent was given by the patient. Immediately prior to procedure a time out was called to verify the correct patient, procedure, equipment, support staff and site/side marked as required. Patient was prepped and draped in the usual sterile fashion.         Clinical History: No specialty comments available.     Objective:  VS:  HT:    WT:   BMI:     BP:   HR: bpm  TEMP: ( )  RESP:  Physical Exam   Imaging: No results found.

## 2021-06-03 ENCOUNTER — Other Ambulatory Visit (HOSPITAL_COMMUNITY): Payer: Self-pay

## 2021-06-08 ENCOUNTER — Other Ambulatory Visit (HOSPITAL_COMMUNITY): Payer: Self-pay

## 2021-06-08 DIAGNOSIS — G5601 Carpal tunnel syndrome, right upper limb: Secondary | ICD-10-CM | POA: Diagnosis not present

## 2021-06-08 MED ORDER — OXYCODONE-ACETAMINOPHEN 5-325 MG PO TABS
ORAL_TABLET | ORAL | 0 refills | Status: DC
  Filled 2021-06-08: qty 20, 5d supply, fill #0

## 2021-06-09 MED ORDER — TRIAMCINOLONE ACETONIDE 40 MG/ML IJ SUSP
40.0000 mg | INTRAMUSCULAR | Status: AC | PRN
Start: 2021-05-27 — End: 2021-05-27
  Administered 2021-05-27: 40 mg via INTRA_ARTICULAR

## 2021-06-09 MED ORDER — BUPIVACAINE HCL 0.25 % IJ SOLN
5.0000 mL | INTRAMUSCULAR | Status: AC | PRN
Start: 2021-05-27 — End: 2021-05-27
  Administered 2021-05-27: 5 mL via INTRA_ARTICULAR

## 2021-06-09 MED ORDER — BUPIVACAINE HCL 0.25 % IJ SOLN
4.0000 mL | INTRAMUSCULAR | Status: AC | PRN
Start: 2021-05-27 — End: 2021-05-27
  Administered 2021-05-27: 4 mL via INTRA_ARTICULAR

## 2021-06-09 MED ORDER — BUPIVACAINE HCL 0.5 % IJ SOLN
3.0000 mL | INTRAMUSCULAR | Status: AC | PRN
Start: 2021-05-27 — End: 2021-05-27
  Administered 2021-05-27: 3 mL via INTRA_ARTICULAR

## 2021-06-09 MED ORDER — TRIAMCINOLONE ACETONIDE 40 MG/ML IJ SUSP
60.0000 mg | INTRAMUSCULAR | Status: AC | PRN
  Administered 2021-05-27: 60 mg via INTRA_ARTICULAR

## 2021-07-01 ENCOUNTER — Ambulatory Visit (INDEPENDENT_AMBULATORY_CARE_PROVIDER_SITE_OTHER): Payer: 59

## 2021-07-01 ENCOUNTER — Other Ambulatory Visit: Payer: Self-pay

## 2021-07-01 ENCOUNTER — Ambulatory Visit (INDEPENDENT_AMBULATORY_CARE_PROVIDER_SITE_OTHER): Payer: 59 | Admitting: Orthopedic Surgery

## 2021-07-01 ENCOUNTER — Encounter: Payer: Self-pay | Admitting: Orthopedic Surgery

## 2021-07-01 DIAGNOSIS — M25512 Pain in left shoulder: Secondary | ICD-10-CM

## 2021-07-01 NOTE — Progress Notes (Signed)
Office Visit Note   Patient: Keith [name].           Date of Birth: 11-01-1940           MRN: [id number] Visit Date: 07/01/2021 Requested by: No referring provider defined for this encounter. PCP: Pcp, No  Subjective: Chief Complaint  Patient presents with   Left Shoulder - Pain    HPI: Keith [name]. is a 80 y.o. male who presents to the office complaining of left shoulder pain.  Patient complains of chronic left shoulder pain that he localizes to the lateral aspect of the shoulder.  Denies any radiation down the arm.  He notes a grinding and clicking sensation when he is moving his shoulder around.  No right-sided symptoms.  He is right-hand dominant.  He has been taking ibuprofen and Tylenol as needed with minimum relief.  He had an injection about 6 weeks ago that provided no relief in the left shoulder.  Denies any previous left shoulder surgery or dislocation.  No history of neck surgery.  Dr. Ronnald Ramp is his neurosurgeon but he is only had lumbar spine surgery by his history.  He also has history of prior aortic valve replacement with a subsequent stroke following this procedure.  He is currently on Eliquis.  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  He goes to [place] 1 time per month.  This involves lifting 50 pound lifting at times but he does have other people helping him.  No history of diabetes or smoking.  Denies any other medical comorbidities.  He has history of prior hip replacement and is in need of a knee replacement but needs to pain relief with his shoulder before he can reliably use a walker for his knee replacement..                ROS: All systems reviewed are negative as they relate to the chief complaint within the history of present illness.  Patient denies fevers or chills.  Assessment & Plan: Visit Diagnoses:  1. Acute pain of left shoulder     Plan: Patient is an 80 year old male who presents with severe end-stage left shoulder  glenohumeral osteoarthritis.  Discussed options available to patient which included but were not limited to living with his symptoms versus trying another injection versus physical therapy versus shoulder replacement.  Discussed the risks and benefits of shoulder replacement including the recovery timeframe, risk of medical complications such as stroke, intraoperative fracture, risk of nerve/vessel damage, shoulder dislocation, shoulder stiffness, periprosthetic joint infection, need for revision surgery.  After discussion of options and risks, patient would like to proceed with shoulder placement.  Discussed that patient would likely benefit most from a reverse shoulder arthroplasty as there is some slight concern about infraspinatus with small amount of external rotation weakness though this could be more related to pain on exam.  Also due to his age of 81, RSA is his best option for avoiding repeat surgery in the future.  He understands there is a 20 pound lifting restriction for this arm forever and he would like to proceed regardless.  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  Lives at home with his wife who is a Software engineer who works at [employer].  His sister will also be able to assist him after surgery.  Plan to order thin cut CT scan of the  left shoulder for preoperative planning purposes.  Follow-up after CT scan to review scan and posterior procedure.  His glenohumeral injection by Dr. Ernestina Patches was on 05/27/2021 so earliest surgical date would be 08/27/2021.  Follow-Up Instructions: No follow-ups on file.   Orders:  Orders Placed This Encounter  Procedures   XR Shoulder Left   CT SHOULDER LEFT WO CONTRAST   No orders of the defined types were placed in this encounter.     Procedures: No procedures performed   Clinical Data: No additional findings.  Objective: Vital Signs: There were no vitals taken for this  visit.  Physical Exam:  Constitutional: Patient appears well-developed HEENT:  Head: Normocephalic Eyes:EOM are normal Neck: Normal range of motion Cardiovascular: Normal rate Pulmonary/chest: Effort normal Neurologic: Patient is alert Skin: Skin is warm Psychiatric: Patient has normal mood and affect  Ortho Exam: Ortho exam demonstrates left shoulder with 25 degrees external rotation, 70 degrees abduction, 90 degrees forward flexion.  This compared with his right shoulder which has 60 degrees external rotation, 90 degrees abduction, 160 degrees forward flexion.  Axillary nerve intact with deltoid firing in the left shoulder.  Excellent supraspinatus and subscapularis strength.  Good external rotation strength on exam today but full strength is somewhat limited by patient's pain due to the coarseness and "gear shifting" sensation that he experiences when he rotates out.  5/5 motor strength of bilateral grip strength, EPL, finger abduction, pronation/supination, bicep flexion, tricep extension, deltoid.  Negative Hornblower sign.  Negative external rotation lag sign.  Specialty Comments:  No specialty comments available.  Imaging: No results found.   PMFS History: Patient Active Problem List   Diagnosis Date Noted   Status post left hip replacement 12/19/2020   Primary osteoarthritis of left hip 12/04/2020   Hyperlipidemia 04/30/2020   Cataract 10/10/2019   Right ureteral stone 10/01/2019   Left ureteral stone 10/01/2019   S/P lumbar fusion 06/15/2019   Acquired hypothyroidism 08/10/2017   Benign prostatic hyperplasia with nocturia 08/10/2017   S/P lumbar laminectomy 06/01/2017   PAF (paroxysmal atrial fibrillation) ([place]) 09/07/2016   S/P AVR 01/18/2014   Hypertension 02/22/2013   Past Medical History:  Diagnosis Date   Arthritis    Atrial fibrillation ([place])    Cardioversion 2016   Atrial flutter ([place])    Cardioversion 2013   Blood transfusion without  reported diagnosis    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   Dysrhythmia    GERD (gastroesophageal reflux disease)    History of aortic valve replacement with bioprosthetic valve    Aortic regurgitation   History of chicken pox    History of kidney stones    History of skin cancer    History of stroke 2011   Hypothyroidism    Kidney stone    Seasonal allergies    Stroke [facility])    after heart surgery 2010    Family History  Problem Relation Age of Onset   Colon cancer Neg Hx    Esophageal cancer Neg Hx    Rectal cancer Neg Hx    Stomach cancer Neg Hx     Past Surgical History:  Procedure Laterality Date   AORTIC VALVE REPLACEMENT  2009   Bioprosthetic AVR and root replacement   BACK SURGERY     total of 4 back surgeries   CATARACT EXTRACTION, BILATERAL     COLONOSCOPY     CYSTOSCOPY WITH RETROGRADE PYELOGRAM, URETEROSCOPY AND STENT PLACEMENT Bilateral 10/01/2019   Procedure: CYSTOSCOPY WITH RETROGRADE  PYELOGRAM, URETEROSCOPY AND STENT PLACEMENT;  Surgeon: Irine Seal, MD;  [facility];  Service: Urology;  Laterality: Bilateral;   EPIGASTRIC HERNIA REPAIR     HERNIA REPAIR     LUMBAR LAMINECTOMY/DECOMPRESSION MICRODISCECTOMY Right 06/01/2017   Procedure: EXTRAFORAMINAL MICRODISCECTOMY LUMBAR FIVE- SACRAL ONE, RIGHT;  Surgeon: Eustace Moore, MD;  Location: [place];  Service: Neurosurgery;  Laterality: Right;   LUMBAR LAMINECTOMY/DECOMPRESSION MICRODISCECTOMY Bilateral 09/04/2018   Procedure: Laminectomy and Foraminotomy - Lumbar two-three bilateral;  Surgeon: Eustace Moore, MD;  Location: [place];  Service: Neurosurgery;  Laterality: Bilateral;   TOTAL HIP ARTHROPLASTY Left 12/19/2020   Procedure: LEFT TOTAL HIP ARTHROPLASTY ANTERIOR APPROACH;  Surgeon: Mcarthur Rossetti, MD;  [facility];  Service: Orthopedics;  Laterality: Left;   TRANSFORAMINAL LUMBAR INTERBODY FUSION (TLIF) WITH PEDICLE SCREW FIXATION 1 LEVEL Left 06/15/2019    Procedure: Decompression and Instrumention Fusion Lumbar Four- Five;  Surgeon: Eustace Moore, MD;  Location: [place];  Service: Neurosurgery;  Laterality: Left;  Decompression and Instrumention Fusion Lumbar Four- Five   Social History   Occupational History   Occupation: Chief Strategy Officer  Tobacco Use   Smoking status: Never   Smokeless tobacco: Never  Vaping Use   Vaping Use: Never used  Substance and Sexual Activity   Alcohol use: Yes    Comment: occasional   Drug use: No   Sexual activity: Yes

## 2021-07-06 ENCOUNTER — Encounter: Payer: Self-pay | Admitting: Orthopedic Surgery

## 2021-07-07 DIAGNOSIS — E039 Hypothyroidism, unspecified: Secondary | ICD-10-CM | POA: Diagnosis not present

## 2021-07-07 DIAGNOSIS — Z131 Encounter for screening for diabetes mellitus: Secondary | ICD-10-CM | POA: Diagnosis not present

## 2021-07-07 DIAGNOSIS — R7301 Impaired fasting glucose: Secondary | ICD-10-CM | POA: Diagnosis not present

## 2021-07-07 DIAGNOSIS — Z0001 Encounter for general adult medical examination with abnormal findings: Secondary | ICD-10-CM | POA: Diagnosis not present

## 2021-07-07 DIAGNOSIS — I10 Essential (primary) hypertension: Secondary | ICD-10-CM | POA: Diagnosis not present

## 2021-07-07 DIAGNOSIS — Z125 Encounter for screening for malignant neoplasm of prostate: Secondary | ICD-10-CM | POA: Diagnosis not present

## 2021-07-08 ENCOUNTER — Ambulatory Visit
Admission: RE | Admit: 2021-07-08 | Discharge: 2021-07-08 | Disposition: A | Discharge status: Home / Self Care | Payer: 59 | Source: Ambulatory Visit | Attending: Orthopedic Surgery | Admitting: Orthopedic Surgery

## 2021-07-08 ENCOUNTER — Other Ambulatory Visit: Payer: Self-pay

## 2021-07-08 DIAGNOSIS — M25512 Pain in left shoulder: Secondary | ICD-10-CM | POA: Diagnosis not present

## 2021-07-12 IMAGING — MR MR HIP*L* W/O CM
5 series · 40 of 40 positions shown · non-contrast
Comparison: CT abdomen pelvis dated December 07, 2019.

CLINICAL DATA: Left hip and leg pain for the past 3 months.

EXAM:
MR OF THE LEFT HIP WITHOUT CONTRAST
TECHNIQUE: Multiplanar, multisequence MR imaging was performed. No intravenous
contrast was administered.

[Series 8: T1 · coronal · left · 4.0mm · 1.09mm/px · 10 of 38 slices shown]
[im 1/38]
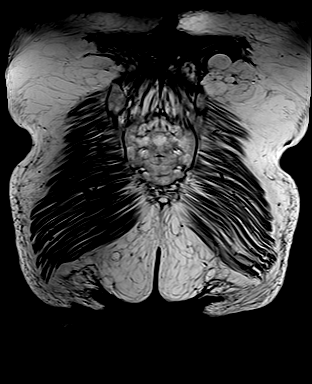
[im 5/38]
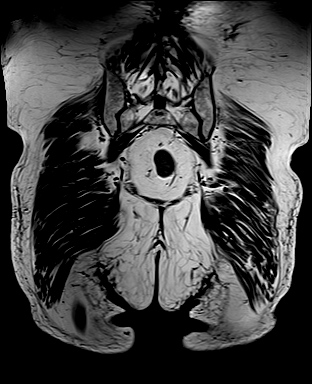
[im 9/38]
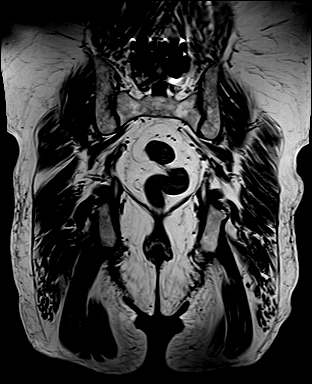
[im 13/38]
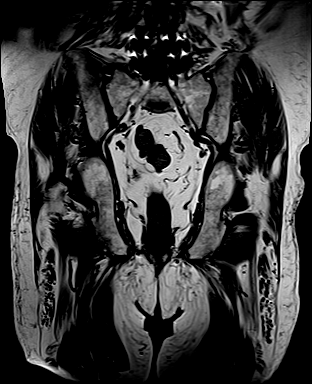
[im 17/38]
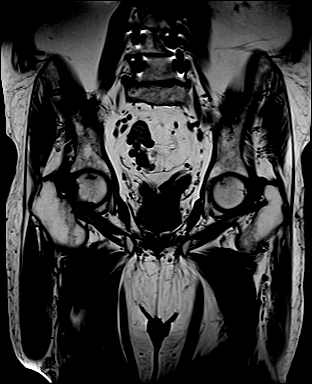
[im 21/38]
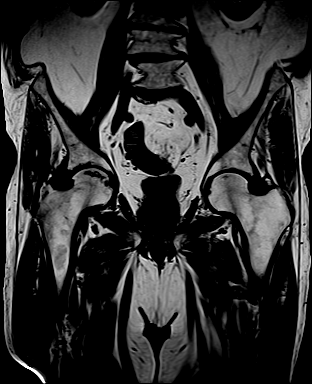
[im 25/38]
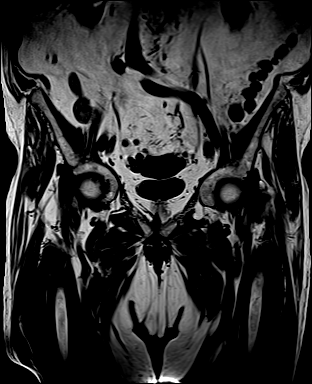
[im 29/38]
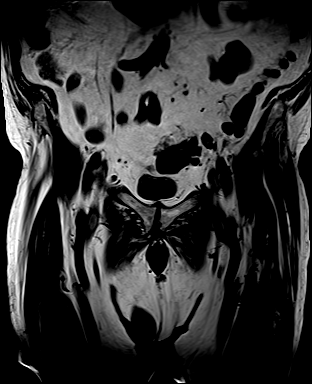
[im 33/38]
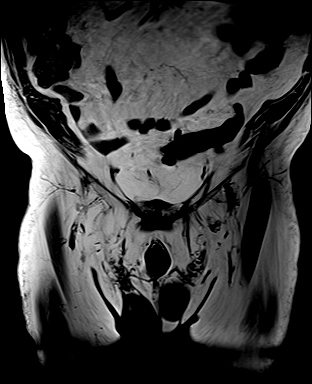
[im 38/38]
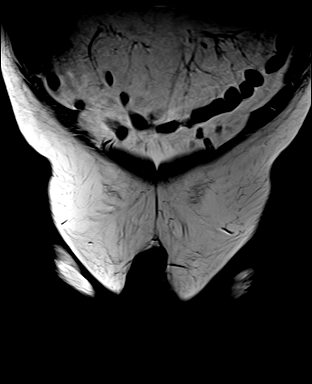

[Series 9: STIR · coronal · left · 4.0mm · 1.14mm/px · 9 of 38 slices shown]
[im 1/38]
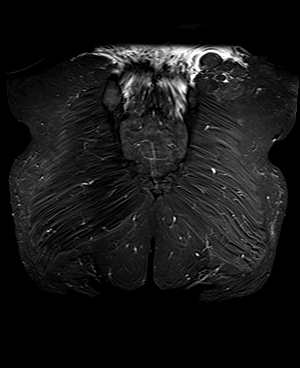
[im 5/38]
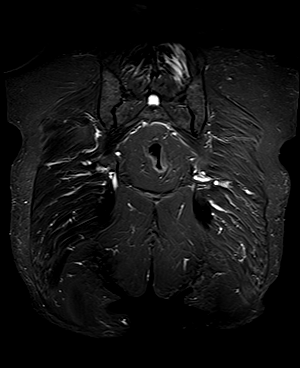
[im 10/38]
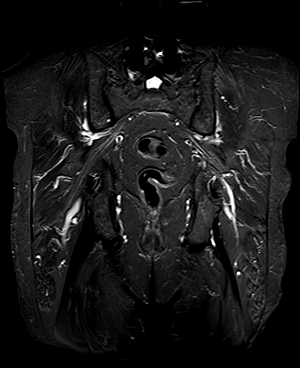
[im 14/38]
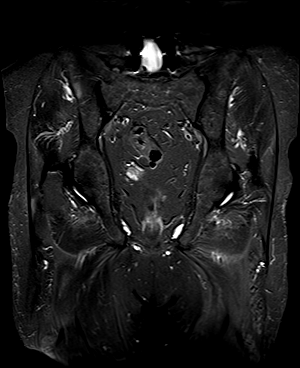
[im 19/38]
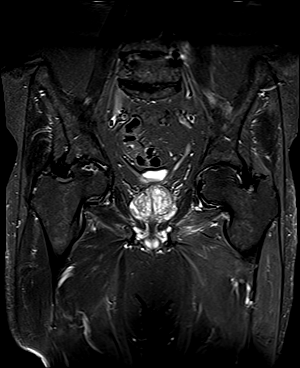
[im 24/38]
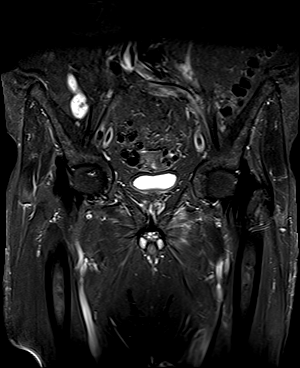
[im 28/38]
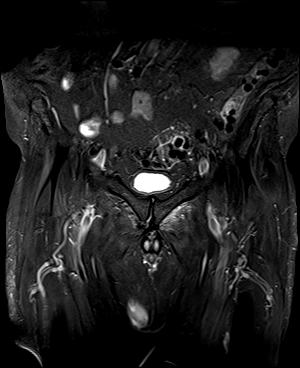
[im 33/38]
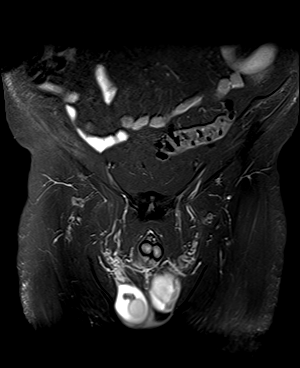
[im 38/38]
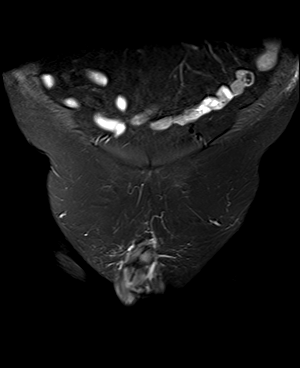

[Series 10: T2 fat-sat · axial · left · 4.0mm · 1.50mm/px · z∈[-96,+74]mm · 8 of 35 slices shown]
[im 1/35]
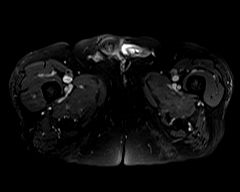
[im 5/35]
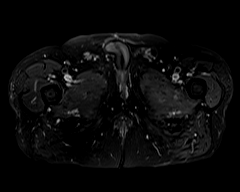
[im 10/35]
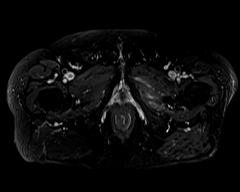
[im 15/35]
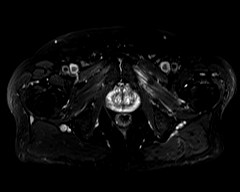
[im 20/35]
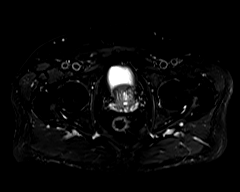
[im 25/35]
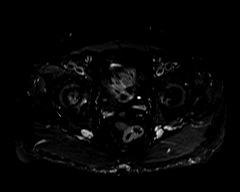
[im 30/35]
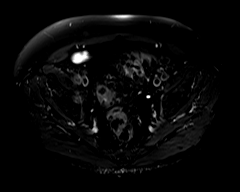
[im 35/35]
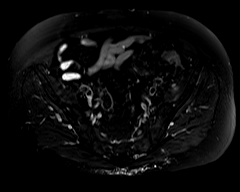

[Series 11: PD fat-sat · sagittal · left · 4.0mm · 0.78mm/px · 7 of 29 slices shown (1 of 2)]
[im 1/29]
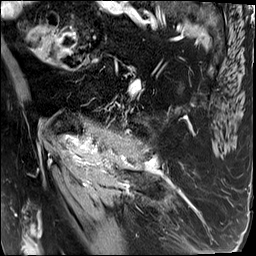
[im 5/29]
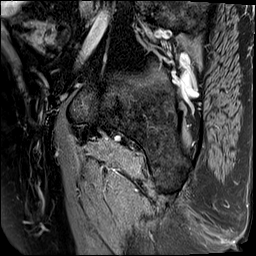
[im 10/29]
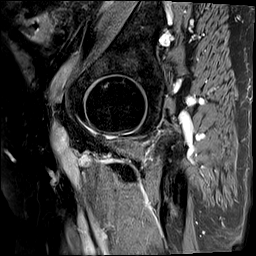
[im 15/29]
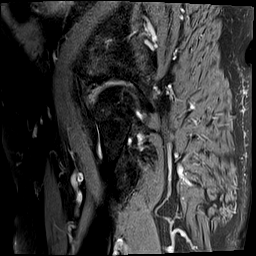
[im 19/29]
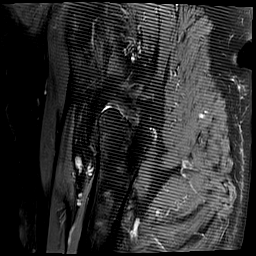
[im 24/29]
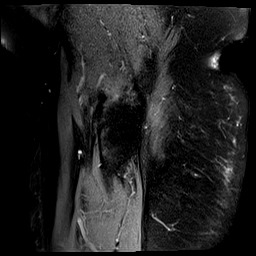
[im 29/29]
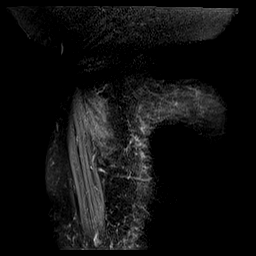

[Series 12: PD fat-sat · coronal · left · 4.0mm · 0.70mm/px · 6 of 26 slices shown (2 of 2)]
[im 1/26]
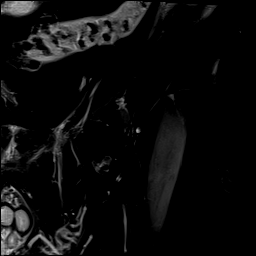
[im 6/26]
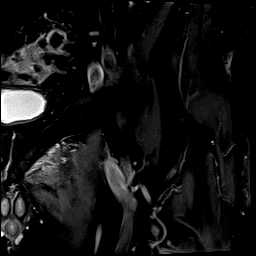
[im 11/26]
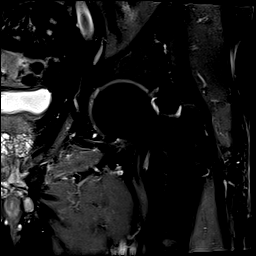
[im 16/26]
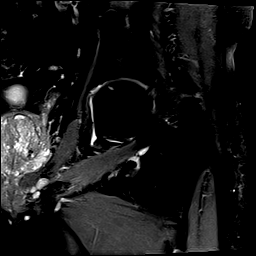
[im 21/26]
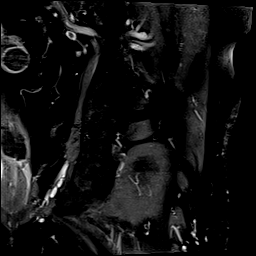
[im 26/26]
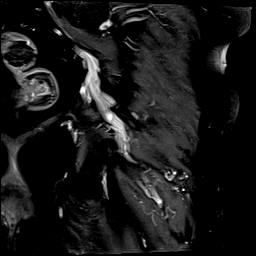

[40 of 40 positions shown; findings below may reference images not displayed]

FINDINGS: Bones: Serpiginous signal abnormality in the femoral heads
consistent with mild right and minimal left chronic avascular
necrosis. No acute fracture or dislocation. No focal bone lesion.
The visualized sacroiliac joints and symphysis pubis appear normal.
Prior lumbar fusion.

Articular cartilage and labrum

Articular cartilage: Moderate bilateral hip joint space narrowing
and diffuse cartilage thinning. No subchondral signal abnormality
identified.

Labrum: Bilateral anterior superior labral tears.

Joint or bursal effusion

Joint effusion: No significant hip joint effusion.

Bursae: No focal periarticular fluid collection.

Muscles and tendons

Muscles and tendons: Small partial tears of the right gluteus
minimus and left hamstring origin tendons. The visualized iliopsoas
tendons appear normal. Mild edema in the left adductor muscles. No
significant muscle atrophy.

Other findings

Miscellaneous: Mild prostatomegaly with median lobe hypertrophy
indenting the bladder base. Severe sigmoid colonic diverticulosis.
IMPRESSION: 1. Moderate bilateral hip osteoarthritis.
2. Bilateral anterior superior labral tears.
3. Mild right and minimal left femoral head chronic avascular
necrosis.
4. Small partial tears of the right gluteus minimus and left
hamstring origin tendons.

## 2021-07-19 DIAGNOSIS — N1831 Chronic kidney disease, stage 3a: Secondary | ICD-10-CM | POA: Insufficient documentation

## 2021-07-20 DIAGNOSIS — Z23 Encounter for immunization: Secondary | ICD-10-CM | POA: Diagnosis not present

## 2021-07-20 DIAGNOSIS — Z954 Presence of other heart-valve replacement: Secondary | ICD-10-CM | POA: Diagnosis not present

## 2021-07-20 DIAGNOSIS — N400 Enlarged prostate without lower urinary tract symptoms: Secondary | ICD-10-CM | POA: Diagnosis not present

## 2021-07-20 DIAGNOSIS — Z125 Encounter for screening for malignant neoplasm of prostate: Secondary | ICD-10-CM | POA: Diagnosis not present

## 2021-07-20 DIAGNOSIS — M545 Low back pain, unspecified: Secondary | ICD-10-CM | POA: Diagnosis not present

## 2021-07-20 DIAGNOSIS — C4491 Basal cell carcinoma of skin, unspecified: Secondary | ICD-10-CM | POA: Diagnosis not present

## 2021-07-20 DIAGNOSIS — Z8673 Personal history of transient ischemic attack (TIA), and cerebral infarction without residual deficits: Secondary | ICD-10-CM | POA: Diagnosis not present

## 2021-07-20 DIAGNOSIS — M1990 Unspecified osteoarthritis, unspecified site: Secondary | ICD-10-CM | POA: Diagnosis not present

## 2021-07-20 DIAGNOSIS — Z0001 Encounter for general adult medical examination with abnormal findings: Secondary | ICD-10-CM | POA: Diagnosis not present

## 2021-07-20 DIAGNOSIS — Z87442 Personal history of urinary calculi: Secondary | ICD-10-CM | POA: Diagnosis not present

## 2021-07-20 DIAGNOSIS — G5603 Carpal tunnel syndrome, bilateral upper limbs: Secondary | ICD-10-CM | POA: Insufficient documentation

## 2021-07-20 DIAGNOSIS — E039 Hypothyroidism, unspecified: Secondary | ICD-10-CM | POA: Diagnosis not present

## 2021-07-20 DIAGNOSIS — I10 Essential (primary) hypertension: Secondary | ICD-10-CM | POA: Diagnosis not present

## 2021-07-27 ENCOUNTER — Ambulatory Visit (INDEPENDENT_AMBULATORY_CARE_PROVIDER_SITE_OTHER): Payer: 59 | Admitting: Orthopedic Surgery

## 2021-07-27 ENCOUNTER — Other Ambulatory Visit: Payer: Self-pay

## 2021-07-27 DIAGNOSIS — M19012 Primary osteoarthritis, left shoulder: Secondary | ICD-10-CM | POA: Diagnosis not present

## 2021-07-27 DIAGNOSIS — M1611 Unilateral primary osteoarthritis, right hip: Secondary | ICD-10-CM

## 2021-07-27 DIAGNOSIS — M1612 Unilateral primary osteoarthritis, left hip: Secondary | ICD-10-CM

## 2021-07-27 DIAGNOSIS — M1712 Unilateral primary osteoarthritis, left knee: Secondary | ICD-10-CM

## 2021-07-29 ENCOUNTER — Encounter: Payer: Self-pay | Admitting: Orthopedic Surgery

## 2021-07-29 NOTE — Progress Notes (Signed)
Yes, I have the surgery sheet.  Josh picked up the CD from [place] today and will let me know the soonest date we can schedule.

## 2021-07-29 NOTE — Progress Notes (Signed)
Office Visit Note   Patient: Keith [name].           Date of Birth: January 15, 1941           MRN: [id number] Visit Date: 07/27/2021 Requested by: No referring provider defined for this encounter. PCP: Celene Squibb, MD  Subjective: Chief Complaint  Patient presents with   Other    Scan review    HPI: Keith [name] is an 81 year old patient with left shoulder arthritis.  He has debilitating pain on a daily basis.  CT scan shows severe arthritis of the glenohumeral joint with maintenance of enough glenoid bone stock that reverse shoulder replacement should be feasible.  Rotator cuff tendons appear to be intact with no muscle atrophy.  Patient has a wife at home.  He also works on a farm.  He is able to be off blood thinners for his surgery.              ROS: All systems reviewed are negative as they relate to the chief complaint within the history of present illness.  Patient denies  fevers or chills.   Assessment & Plan: Visit Diagnoses:  1. Primary osteoarthritis of right hip   2. Primary osteoarthritis of left knee   3. Primary osteoarthritis of left hip   4. Arthritis of left shoulder region     Plan: Impression is left shoulder arthritis.  Plan is left reverse shoulder replacement.  The risk and benefits are discussed with the patient including but not limited to infection nerve vessel damage dislocation as well as incomplete restoration of function and incomplete pain relief.  Patient would like to get the shoulder fixed so he can get the knee fixed which is also arthritic.  I think with his rotator cuff function instability is possible but less likely if we can maintain his rotator cuff.  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  We discussed lifting restrictions inherent with shoulder replacement.  All questions answered  Follow-Up Instructions: No follow-ups on file.   Orders:  No orders of the defined types were placed in this encounter.  No orders of the defined types  were placed in this encounter.     Procedures: No procedures performed   Clinical Data: No additional findings.  Objective: Vital Signs: There were no vitals taken for this visit.  Physical Exam:   Constitutional: Patient appears well-developed HEENT:  Head: Normocephalic Eyes:EOM are normal Neck: Normal range of motion Cardiovascular: Normal rate Pulmonary/chest: Effort normal Neurologic: Patient is alert Skin: Skin is warm Psychiatric: Patient has normal mood and affect   Ortho Exam: Ortho exam demonstrates good rotator cuff strength on the left infraspinatus supraspinatus subscap muscle testing.  Has diminished range of motion passively to about 20 degrees of external rotation 80 degrees of AB duction and just over 90 degrees of forward flexion.  Deltoid is functional.  Radial pulse intact.  Motor or sensory function to the hand intact.  Specialty Comments:  No specialty comments available.  Imaging: No results found.   PMFS History: Patient Active Problem List   Diagnosis Date Noted   Status post left hip replacement 12/19/2020   Primary osteoarthritis of left hip 12/04/2020   Hyperlipidemia 04/30/2020   Cataract 10/10/2019   Right ureteral stone 10/01/2019   Left ureteral stone 10/01/2019   S/P lumbar fusion 06/15/2019   Acquired hypothyroidism 08/10/2017   Benign prostatic hyperplasia with nocturia 08/10/2017   S/P lumbar laminectomy 06/01/2017   PAF (  paroxysmal atrial fibrillation) ([place]) 09/07/2016   S/P AVR 01/18/2014   Hypertension 02/22/2013   Past Medical History:  Diagnosis Date   Arthritis    Atrial fibrillation [facility])    Cardioversion 2016   Atrial flutter [facility])    Cardioversion 2013   Blood transfusion without reported diagnosis    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   Dysrhythmia    GERD (gastroesophageal reflux disease)    History of aortic valve replacement with bioprosthetic valve    Aortic regurgitation   History of chicken  pox    History of kidney stones    History of skin cancer    History of stroke 2011   Hypothyroidism    Kidney stone    Seasonal allergies    Stroke [facility])    after heart surgery 2010    Family History  Problem Relation Age of Onset   Colon cancer Neg Hx    Esophageal cancer Neg Hx    Rectal cancer Neg Hx    Stomach cancer Neg Hx     Past Surgical History:  Procedure Laterality Date   AORTIC VALVE REPLACEMENT  2009   Bioprosthetic AVR and root replacement   BACK SURGERY     total of 4 back surgeries   CATARACT EXTRACTION, BILATERAL     COLONOSCOPY     CYSTOSCOPY WITH RETROGRADE PYELOGRAM, URETEROSCOPY AND STENT PLACEMENT Bilateral 10/01/2019   Procedure: CYSTOSCOPY WITH RETROGRADE PYELOGRAM, URETEROSCOPY AND STENT PLACEMENT;  Surgeon: Irine Seal, MD;  Location: [facility];  Service: Urology;  Laterality: Bilateral;   EPIGASTRIC HERNIA REPAIR     HERNIA REPAIR     LUMBAR LAMINECTOMY/DECOMPRESSION MICRODISCECTOMY Right 06/01/2017   Procedure: EXTRAFORAMINAL MICRODISCECTOMY LUMBAR FIVE- SACRAL ONE, RIGHT;  Surgeon: Eustace Moore, MD;  Location: [place];  Service: Neurosurgery;  Laterality: Right;   LUMBAR LAMINECTOMY/DECOMPRESSION MICRODISCECTOMY Bilateral 09/04/2018   Procedure: Laminectomy and Foraminotomy - Lumbar two-three bilateral;  Surgeon: Eustace Moore, MD;  Location: [place];  Service: Neurosurgery;  Laterality: Bilateral;   TOTAL HIP ARTHROPLASTY Left 12/19/2020   Procedure: LEFT TOTAL HIP ARTHROPLASTY ANTERIOR APPROACH;  Surgeon: Mcarthur Rossetti, MD;  Location: [facility];  Service: Orthopedics;  Laterality: Left;   TRANSFORAMINAL LUMBAR INTERBODY FUSION (TLIF) WITH PEDICLE SCREW FIXATION 1 LEVEL Left 06/15/2019   Procedure: Decompression and Instrumention Fusion Lumbar Four- Five;  Surgeon: Eustace Moore, MD;  Location: [place];  Service: Neurosurgery;  Laterality: Left;  Decompression and Instrumention Fusion Lumbar Four- Five   Social History   Occupational History    Occupation: Chief Strategy Officer  Tobacco Use   Smoking status: Never   Smokeless tobacco: Never  Vaping Use   Vaping Use: Never used  Substance and Sexual Activity   Alcohol use: Yes    Comment: occasional   Drug use: No   Sexual activity: Yes

## 2021-07-31 ENCOUNTER — Other Ambulatory Visit (HOSPITAL_COMMUNITY): Payer: Self-pay

## 2021-08-11 ENCOUNTER — Other Ambulatory Visit: Payer: Self-pay

## 2021-08-27 NOTE — Progress Notes (Addendum)
Surgical Instructions    Your procedure is scheduled on 09/01/21.  Report to [facility] Main Entrance "A" at 6:00 A.M., then check in with the Admitting office.  Call this number if you have problems the morning of surgery:  [phone]   If you have any questions prior to your surgery date call [phone]: Open Monday-Friday 8am-4pm    Remember:  Do not eat after midnight the night before your surgery  You may drink clear liquids until 5:00 the morning of your surgery.   Clear liquids allowed are: Water, Non-Citrus Juices (without pulp), Carbonated Beverages, Clear Tea, Black Coffee ONLY (NO MILK, CREAM OR POWDERED CREAMER of any kind), and Gatorade  Please complete your PRE-SURGERY ENSURE that was provided to you by 5:00 am the morning of surgery.  Please, if able, drink it in one setting. DO NOT SIP.     Take these medicines the morning of surgery with A SIP OF WATER:  cetirizine (ZYRTEC)  levothyroxine (SYNTHROID)  AS NEEDED: acetaminophen (TYLENOL) cyclobenzaprine (FLEXERIL) famotidine (PEPCID)  oxyCODONE-acetaminophen (PERCOCET/ROXICET)  Follow your surgeon's instructions on when to stop Eliquis.  If no instructions were given by your surgeon then you will need to call the office to get those instructions.      As of today, STOP taking any Aspirin (unless otherwise instructed by your surgeon) Aleve, Naproxen, Ibuprofen, Motrin, Advil, Goody's, BC's, all herbal medications, fish oil, and all vitamins.           Do not wear jewelry  Do not wear lotions, powders, colognes, or deodorant. Do not shave 48 hours prior to surgery.  Men may shave face and neck. Do not bring valuables to the hospital.   [facility] is not responsible for any belongings or valuables. .   Do NOT Smoke (Tobacco/Vaping)  24 hours prior to your procedure  If you use a CPAP at night, you may bring your mask for your overnight stay.   Contacts, glasses, hearing aids, dentures or partials may not  be worn into surgery, please bring cases for these belongings   For patients admitted to the hospital, discharge time will be determined by your treatment team.   Patients discharged the day of surgery will not be allowed to drive home, and someone needs to stay with them for 24 hours.  NO VISITORS WILL BE ALLOWED IN PRE-OP WHERE PATIENTS ARE PREPPED FOR SURGERY.  ONLY 1 SUPPORT PERSON MAY BE PRESENT IN THE WAITING ROOM WHILE YOU ARE IN SURGERY.  IF YOU ARE TO BE ADMITTED, ONCE YOU ARE IN YOUR ROOM YOU WILL BE ALLOWED TWO (2) VISITORS. 1 (ONE) VISITOR MAY STAY OVERNIGHT BUT MUST ARRIVE TO THE ROOM BY 8pm.  Minor children may have two parents present. Special consideration for safety and communication needs will be reviewed on a case by case basis.  Special instructions:    Oral Hygiene is also important to reduce your risk of infection.  Remember - BRUSH YOUR TEETH THE MORNING OF SURGERY WITH YOUR REGULAR TOOTHPASTE                                   [place]- Preparing for Total Shoulder Arthroplasty   Before surgery, you can play an important role. Because skin is not sterile, your skin needs to be as free of germs as possible. You can reduce the number of germs on your skin by using the following products. Benzoyl  Peroxide Gel Reduces the number of germs present on the skin Applied twice a day to shoulder area starting two days before surgery   Chlorhexidine Gluconate (CHG) Soap An antiseptic cleaner that kills germs and bonds with the skin to continue killing germs even after washing Used for showering the night before surgery and morning of surgery    ==================================================================  Please follow these instructions carefully:  BENZOYL PEROXIDE 5% GEL  Please do not use if you have an allergy to benzoyl peroxide.   If your skin becomes reddened/irritated stop using the benzoyl peroxide.  Starting two days before surgery, apply as  follows: Apply benzoyl peroxide in the morning and at night. Apply after taking a shower. If you are not taking a shower clean entire shoulder front, back, and side along with the armpit with a clean wet washcloth.  Place a quarter-sized dollop on your shoulder and rub in thoroughly, making sure to cover the front, back, and side of your shoulder, along with the armpit.   2 days before ____ AM   ____ PM              1 day before ____ AM   ____ PM                            Do this twice a day for two days.  (Last application is the night before surgery, AFTER using the CHG soap as described below).  Do NOT apply benzoyl peroxide gel on the day of surgery.  CHLORHEXIDINE GLUCONATE (CHG) SOAP  Please do not use if you have an allergy to CHG or antibacterial soaps. If your skin becomes reddened/irritated stop using the CHG.   Do not shave (including legs and underarms) for at least 48 hours prior to first CHG shower. It is OK to shave your face.  Starting the night before surgery, use CHG soap as follows:  Shower the NIGHT BEFORE SURGERY and MORNING OF SURGERY with CHG.  If you choose to wash your hair, wash your hair first as usual with your normal shampoo.  After shampooing, rinse your hair and body thoroughly to remove the shampoo.  Use CHG as you would any other liquid soap.  You can apply CHG directly to the skin and wash gently with a scrungie or a clean washcloth.  Apply the CHG soap to your body ONLY FROM THE NECK DOWN.  Do not use on open wounds or open sores.  Avoid contact with your eyes, ears, mouth, and genitals (private parts).  Wash face and genitals (private parts) with your normal soap.  Wash thoroughly, paying special attention to the area where your surgery will be performed.  Thoroughly rinse your body with warm water from the neck down.  DO NOT shower/wash with your normal soap after using and rinsing off the CHG soap.   Pat yourself dry with a CLEAN TOWEL.     Apply benzoyl peroxide.   Wear CLEAN PAJAMAS to bed the night before surgery; wear comfortable clothes the morning of surgery.  Place CLEAN SHEETS on your bed the night of your first shower and DO NOT SLEEP WITH PETS.  Day of Surgery: Shower as above Do not apply any deodorants/lotions.  Please wear clean clothes to the hospital/surgery center.   Remember to brush your teeth WITH YOUR REGULAR TOOTHPASTE.   [place]- Preparing For Surgery  Before surgery, you can play an important role. Because skin is  not sterile, your skin needs to be as free of germs as possible. You can reduce the number of germs on your skin by washing with CHG (chlorahexidine gluconate) Soap before surgery.  CHG is an antiseptic cleaner which kills germs and bonds with the skin to continue killing germs even after washing.     Please do not use if you have an allergy to CHG or antibacterial soaps. If your skin becomes reddened/irritated stop using the CHG.  Do not shave (including legs and underarms) for at least 48 hours prior to first CHG shower. It is OK to shave your face.  Please follow these instructions carefully.    COVID testing  If you are going to stay overnight or be admitted after your procedure/surgery and require a pre-op COVID test, please follow these instructions after your COVID test   You are not required to quarantine however you are required to wear a well-fitting mask when you are out and around people not in your household.  If your mask becomes wet or soiled, replace with a new one.  Wash your hands often with soap and water for 20 seconds or clean your hands with an alcohol-based hand sanitizer that contains at least 60% alcohol.  Do not share personal items.  Notify your provider: if you are in close contact with someone who has COVID  or if you develop a fever of 100.4 or greater, sneezing, cough, sore throat, shortness of breath or body aches.    Please read over the  following fact sheets that you were given.

## 2021-08-28 ENCOUNTER — Encounter (HOSPITAL_COMMUNITY)
Admission: RE | Admit: 2021-08-28 | Discharge: 2021-08-28 | Disposition: A | Discharge status: Home / Self Care | Payer: 59 | Source: Ambulatory Visit | Attending: Orthopedic Surgery | Admitting: Orthopedic Surgery

## 2021-08-28 ENCOUNTER — Other Ambulatory Visit: Payer: Self-pay

## 2021-08-28 ENCOUNTER — Encounter (HOSPITAL_COMMUNITY): Payer: Self-pay

## 2021-08-28 VITALS — BP 150/78 | HR 81 | Temp 97.6°F | Resp 17 | Ht 71.0 in | Wt 186.0 lb

## 2021-08-28 DIAGNOSIS — Z20822 Contact with and (suspected) exposure to covid-19: Secondary | ICD-10-CM | POA: Diagnosis not present

## 2021-08-28 DIAGNOSIS — Z01818 Encounter for other preprocedural examination: Secondary | ICD-10-CM | POA: Diagnosis not present

## 2021-08-28 LAB — CBC
HCT: 43.8 % (ref 39.0–52.0)
Hemoglobin: 15 g/dL (ref 13.0–17.0)
MCH: 32.1 pg (ref 26.0–34.0)
MCHC: 34.2 g/dL (ref 30.0–36.0)
MCV: 93.8 fL (ref 80.0–100.0)
Platelets: 187 10*3/uL (ref 150–400)
RBC: 4.67 MIL/uL (ref 4.22–5.81)
RDW: 13.8 % (ref 11.5–15.5)
WBC: 7.5 10*3/uL (ref 4.0–10.5)
nRBC: 0 % (ref 0.0–0.2)

## 2021-08-28 LAB — SURGICAL PCR SCREEN
MRSA, PCR: NEGATIVE
Staphylococcus aureus: NEGATIVE

## 2021-08-28 LAB — URINALYSIS, ROUTINE W REFLEX MICROSCOPIC
Bilirubin Urine: NEGATIVE
Glucose, UA: NEGATIVE mg/dL
Hgb urine dipstick: NEGATIVE
Ketones, ur: NEGATIVE mg/dL
Leukocytes,Ua: NEGATIVE
Nitrite: NEGATIVE
Protein, ur: NEGATIVE mg/dL
Specific Gravity, Urine: 1.019 (ref 1.005–1.030)
pH: 5 (ref 5.0–8.0)

## 2021-08-28 LAB — BASIC METABOLIC PANEL
Anion gap: 8 (ref 5–15)
BUN: 29 mg/dL — ABNORMAL HIGH (ref 8–23)
CO2: 26 mmol/L (ref 22–32)
Calcium: 8.8 mg/dL — ABNORMAL LOW (ref 8.9–10.3)
Chloride: 107 mmol/L (ref 98–111)
Creatinine, Ser: 1.05 mg/dL (ref 0.61–1.24)
GFR, Estimated: >60 mL/min (ref >60)
Glucose, Bld: 87 mg/dL (ref 70–99)
Potassium: 3.9 mmol/L (ref 3.5–5.1)
Sodium: 141 mmol/L (ref 135–145)

## 2021-08-28 LAB — SARS CORONAVIRUS 2 (TAT 6-24 HRS): SARS Coronavirus 2: NEGATIVE

## 2021-08-28 NOTE — Progress Notes (Addendum)
PCP: Allyn Kenner, MD Cardiologist: Reinaldo Berber in [place], [place]  EKG: 08/28/21 CXR: 08/25/18 ECHO: 07/21/20 Stress Test: approx 10 years ago Cardiac Cath: denies  Fasting Blood Sugar- na Checks Blood Sugar__na_ times a day  OSA/CPAP: No  ASA: No Blood Thinner: Stopped Eliquis approx 07/26/21  Covid test 08/28/21 at PAT  Anesthesia Review: Yes, cardiac history.  Records requested from cardiology, Dr. Crista Curb.  Patient states last saw cards approx 07/2020.  Patient denies shortness of breath, fever, cough, and chest pain at PAT appointment.  Patient verbalized understanding of instructions provided today at the PAT appointment.  Patient asked to review instructions at home and day of surgery.

## 2021-08-31 NOTE — Progress Notes (Signed)
Anesthesia Chart Review:  Follows yearly with cardiology in [place] for history of atrial fibrillation on Eliquis, CVA, bioprosthetic AVR 2009, HTN.  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  Cardiac clearance dated 08/31/2021 states patient is low risk for procedure.  Copy on chart.  Patient last seen by PCP Valentino Nose, NP on 07/20/2021.  Doing well at that time, discussed upcoming shoulder replacement.  Preop labs reviewed, unremarkable.  EKG 08/28/2021: Atrial fibrillation.  Rate 72. Nonspecific ST and T wave abnormality.  No significant change from prior.  TTE 07/21/2020:  1. Left ventricular ejection fraction, by estimation, is 60 to 65%. The  left ventricle has normal function. The left ventricle has no regional  wall motion abnormalities. There is mild concentric left ventricular  hypertrophy. Left ventricular diastolic  parameters are indeterminate.   2. Right ventricular systolic function is moderately reduced. The right  ventricular size is normal.   3. Left atrial size was severely dilated.   4. The mitral valve is normal in structure. Trivial mitral valve  regurgitation.   5. The aortic valve has been repaired/replaced. Aortic valve  regurgitation is not visualized. No aortic stenosis is present. There is a  27 mm Magna valve present in the aortic position. Procedure Date: 2009.   6. Aortic dilatation noted. There is mild dilatation of the aortic root,  measuring 43 mm.   7. The inferior vena cava is normal in size with greater than 50%  respiratory variability, suggesting right atrial pressure of 3 mmHg.   Comparison(s): A prior study was performed on 09/13/2017. Slight increase in  aortic root. There is slight increase in prosthetic aortic valve  acceleration time (126 ms) without change in gradients suggestive of  obstruction.      Wynonia Musty [facility] Short Stay Center/Anesthesiology Phone [phone] 08/31/2021 3:54 PM

## 2021-08-31 NOTE — Anesthesia Preprocedure Evaluation (Addendum)
Anesthesia Evaluation  Patient identified by MRN, date of birth, ID band Patient awake    Reviewed: Allergy & Precautions, NPO status , Patient's Chart, lab work & pertinent test results  History of Anesthesia Complications Negative for: history of anesthetic complications  Airway Mallampati: II  TM Distance: >3 FB Neck ROM: Full    Dental  (+) Dental Advisory Given   Pulmonary neg pulmonary ROS,  08/28/2021 SARS coronavirus NEG   breath sounds clear to auscultation       Cardiovascular hypertension, Pt. on medications (-) angina+ dysrhythmias Atrial Fibrillation + Valvular Problems/Murmurs (s/p AVR)  Rhythm:Irregular Rate:Normal  '22 ECHO: EF 60-65%, normal LVF with mild LVH, prosthetic valve functions well, no AI, mild aortic dilation 43 mm.   Neuro/Psych CVA, No Residual Symptoms    GI/Hepatic Neg liver ROS, GERD  Medicated and Controlled,  Endo/Other  Hypothyroidism   Renal/GU Renal InsufficiencyRenal disease     Musculoskeletal  (+) Arthritis ,   Abdominal   Peds  Hematology eliquis   Anesthesia Other Findings   Reproductive/Obstetrics                            Anesthesia Physical Anesthesia Plan  ASA: 3  Anesthesia Plan: General   Post-op Pain Management: Regional block* and Tylenol PO (pre-op)*   Induction: Intravenous  PONV Risk Score and Plan: 2 and Ondansetron and Dexamethasone  Airway Management Planned: Oral ETT  Additional Equipment: None  Intra-op Plan:   Post-operative Plan: Extubation in OR  Informed Consent: I have reviewed the patients History and Physical, chart, labs and discussed the procedure including the risks, benefits and alternatives for the proposed anesthesia with the patient or authorized representative who has indicated his/her understanding and acceptance.     Dental advisory given  Plan Discussed with: CRNA and Surgeon  Anesthesia Plan  Comments: (Plan routine monitors, GETA with interscalene block for post op analgesia   PAT note by Karoline Caldwell, PA-C: Follows yearly with cardiology in [place] for history of atrial fibrillation on Eliquis, CVA, bioprosthetic AVR 2009, HTN.  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  Cardiac clearance dated 08/31/2021 states patient is low risk for procedure.  Copy on chart.  Patient last seen by PCP Valentino Nose, NP on 07/20/2021.  Doing well at that time, discussed upcoming shoulder replacement.  Preop labs reviewed, unremarkable.  EKG 08/28/2021: Atrial fibrillation.  Rate 72. Nonspecific ST and T wave abnormality.  No significant change from prior.  TTE 07/21/2020: 1. Left ventricular ejection fraction, by estimation, is 60 to 65%. The  left ventricle has normal function. The left ventricle has no regional  wall motion abnormalities. There is mild concentric left ventricular  hypertrophy. Left ventricular diastolic  parameters are indeterminate.  2. Right ventricular systolic function is moderately reduced. The right  ventricular size is normal.  3. Left atrial size was severely dilated.  4. The mitral valve is normal in structure. Trivial mitral valve  regurgitation.  5. The aortic valve has been repaired/replaced. Aortic valve  regurgitation is not visualized. No aortic stenosis is present. There is a  27 mm Magna valve present in the aortic position. Procedure Date: 2009.  6. Aortic dilatation noted. There is mild dilatation of the aortic root,  measuring 43 mm.  7. The inferior vena cava is normal in size  with greater than 50%  respiratory variability, suggesting right atrial pressure of 3 mmHg.   Comparison(s): A prior study was performed on 09/13/2017. Slight increase in  aortic root. There is slight increase in prosthetic aortic valve   acceleration time (126 ms) without change in gradients suggestive of  obstruction.    )      Anesthesia Quick Evaluation

## 2021-09-01 ENCOUNTER — Other Ambulatory Visit: Payer: Self-pay

## 2021-09-01 ENCOUNTER — Ambulatory Visit (HOSPITAL_BASED_OUTPATIENT_CLINIC_OR_DEPARTMENT_OTHER): Payer: 59 | Admitting: Anesthesiology

## 2021-09-01 ENCOUNTER — Observation Stay (HOSPITAL_COMMUNITY)
Admission: RE | Admit: 2021-09-01 | Discharge: 2021-09-02 | Disposition: A | Discharge status: Home / Self Care | Payer: 59 | Source: Ambulatory Visit | Attending: Orthopedic Surgery | Admitting: Orthopedic Surgery

## 2021-09-01 ENCOUNTER — Encounter (HOSPITAL_COMMUNITY): Payer: Self-pay | Admitting: Orthopedic Surgery

## 2021-09-01 ENCOUNTER — Ambulatory Visit (HOSPITAL_COMMUNITY): Payer: 59 | Admitting: Emergency Medicine

## 2021-09-01 ENCOUNTER — Encounter (HOSPITAL_COMMUNITY)
Admission: RE | Disposition: A | Discharge status: Home / Self Care | Payer: Self-pay | Source: Ambulatory Visit | Attending: Orthopedic Surgery

## 2021-09-01 ENCOUNTER — Observation Stay (HOSPITAL_COMMUNITY): Payer: 59

## 2021-09-01 DIAGNOSIS — Z79899 Other long term (current) drug therapy: Secondary | ICD-10-CM | POA: Insufficient documentation

## 2021-09-01 DIAGNOSIS — Z96642 Presence of left artificial hip joint: Secondary | ICD-10-CM | POA: Diagnosis not present

## 2021-09-01 DIAGNOSIS — M19012 Primary osteoarthritis, left shoulder: Secondary | ICD-10-CM

## 2021-09-01 DIAGNOSIS — Z85828 Personal history of other malignant neoplasm of skin: Secondary | ICD-10-CM | POA: Diagnosis not present

## 2021-09-01 DIAGNOSIS — Z96612 Presence of left artificial shoulder joint: Secondary | ICD-10-CM

## 2021-09-01 DIAGNOSIS — Z01818 Encounter for other preprocedural examination: Secondary | ICD-10-CM

## 2021-09-01 DIAGNOSIS — Z7901 Long term (current) use of anticoagulants: Secondary | ICD-10-CM | POA: Diagnosis not present

## 2021-09-01 DIAGNOSIS — E039 Hypothyroidism, unspecified: Secondary | ICD-10-CM | POA: Diagnosis not present

## 2021-09-01 DIAGNOSIS — I4891 Unspecified atrial fibrillation: Secondary | ICD-10-CM | POA: Diagnosis not present

## 2021-09-01 DIAGNOSIS — Z471 Aftercare following joint replacement surgery: Secondary | ICD-10-CM | POA: Diagnosis not present

## 2021-09-01 DIAGNOSIS — I10 Essential (primary) hypertension: Secondary | ICD-10-CM | POA: Diagnosis not present

## 2021-09-01 DIAGNOSIS — G8918 Other acute postprocedural pain: Secondary | ICD-10-CM | POA: Diagnosis not present

## 2021-09-01 DIAGNOSIS — Z9889 Other specified postprocedural states: Secondary | ICD-10-CM

## 2021-09-01 DIAGNOSIS — I700 Atherosclerosis of aorta: Secondary | ICD-10-CM | POA: Diagnosis not present

## 2021-09-01 HISTORY — PX: REVERSE SHOULDER ARTHROPLASTY: SHX5054

## 2021-09-01 SURGERY — ARTHROPLASTY, SHOULDER, TOTAL, REVERSE
Anesthesia: General | Site: Shoulder | Laterality: Left

## 2021-09-01 MED ORDER — ORAL CARE MOUTH RINSE: 15.0000 mL | Freq: Once | OROMUCOSAL | Status: AC

## 2021-09-01 MED ORDER — ACETAMINOPHEN 325 MG PO TABS: 325.0000 mg | ORAL_TABLET | Freq: Four times a day (QID) | ORAL | Status: DC | PRN

## 2021-09-01 MED ORDER — LISINOPRIL-HYDROCHLOROTHIAZIDE 10-12.5 MG PO TABS: 1.0000 | ORAL_TABLET | Freq: Every day | ORAL | Status: DC

## 2021-09-01 MED ORDER — HYDROCHLOROTHIAZIDE 12.5 MG PO TABS
12.5000 mg | ORAL_TABLET | Freq: Every day | ORAL | Status: DC
  Administered 2021-09-02: 12.5 mg via ORAL
  Filled 2021-09-01: qty 1

## 2021-09-01 MED ORDER — PROPOFOL 10 MG/ML IV BOLUS
INTRAVENOUS | Status: AC
  Filled 2021-09-01: qty 20

## 2021-09-01 MED ORDER — ONDANSETRON HCL 4 MG/2ML IJ SOLN
INTRAMUSCULAR | Status: DC | PRN
Start: 2021-09-01 — End: 2021-09-01
  Administered 2021-09-01: 4 mg via INTRAVENOUS

## 2021-09-01 MED ORDER — MIDAZOLAM HCL 2 MG/2ML IJ SOLN: 0.5000 mg | Freq: Once | INTRAMUSCULAR | Status: DC | PRN

## 2021-09-01 MED ORDER — IRRISEPT - 450ML BOTTLE WITH 0.05% CHG IN STERILE WATER, USP 99.95% OPTIME
TOPICAL | Status: DC | PRN
  Administered 2021-09-01: 450 mL via TOPICAL

## 2021-09-01 MED ORDER — LACTATED RINGERS IV SOLN: INTRAVENOUS | Status: DC

## 2021-09-01 MED ORDER — 0.9 % SODIUM CHLORIDE (POUR BTL) OPTIME
TOPICAL | Status: DC | PRN
  Administered 2021-09-01 (×6): 1000 mL

## 2021-09-01 MED ORDER — POVIDONE-IODINE 7.5 % EX SOLN
Freq: Once | CUTANEOUS | Status: DC
  Filled 2021-09-01: qty 118

## 2021-09-01 MED ORDER — DEXAMETHASONE SODIUM PHOSPHATE 10 MG/ML IJ SOLN
INTRAMUSCULAR | Status: DC | PRN
  Administered 2021-09-01: 4 mg via INTRAVENOUS

## 2021-09-01 MED ORDER — HYDROMORPHONE HCL 1 MG/ML IJ SOLN
INTRAMUSCULAR | Status: AC
  Filled 2021-09-01: qty 1

## 2021-09-01 MED ORDER — FENTANYL CITRATE (PF) 100 MCG/2ML IJ SOLN
INTRAMUSCULAR | Status: AC
  Administered 2021-09-01: 50 ug via INTRAVENOUS
  Filled 2021-09-01: qty 2

## 2021-09-01 MED ORDER — CEFAZOLIN SODIUM-DEXTROSE 2-4 GM/100ML-% IV SOLN
2.0000 g | Freq: Three times a day (TID) | INTRAVENOUS | Status: AC
  Administered 2021-09-01 – 2021-09-02 (×3): 2 g via INTRAVENOUS
  Filled 2021-09-01 (×3): qty 100

## 2021-09-01 MED ORDER — METOCLOPRAMIDE HCL 5 MG PO TABS: 5.0000 mg | ORAL_TABLET | Freq: Three times a day (TID) | ORAL | Status: DC | PRN

## 2021-09-01 MED ORDER — ONDANSETRON HCL 4 MG/2ML IJ SOLN: 4.0000 mg | Freq: Four times a day (QID) | INTRAMUSCULAR | Status: DC | PRN

## 2021-09-01 MED ORDER — PROPOFOL 10 MG/ML IV BOLUS
INTRAVENOUS | Status: DC | PRN
  Administered 2021-09-01: 20 mg via INTRAVENOUS
  Administered 2021-09-01: 100 mg via INTRAVENOUS
  Administered 2021-09-01: 30 mg via INTRAVENOUS
  Administered 2021-09-01: 20 mg via INTRAVENOUS

## 2021-09-01 MED ORDER — PHENYLEPHRINE 40 MCG/ML (10ML) SYRINGE FOR IV PUSH (FOR BLOOD PRESSURE SUPPORT)
PREFILLED_SYRINGE | INTRAVENOUS | Status: AC
  Filled 2021-09-01: qty 10

## 2021-09-01 MED ORDER — HYDROMORPHONE HCL 1 MG/ML IJ SOLN
0.2500 mg | INTRAMUSCULAR | Status: DC | PRN
  Administered 2021-09-01 (×2): 0.5 mg via INTRAVENOUS

## 2021-09-01 MED ORDER — MENTHOL 3 MG MT LOZG: 1.0000 | LOZENGE | OROMUCOSAL | Status: DC | PRN

## 2021-09-01 MED ORDER — LIDOCAINE 2% (20 MG/ML) 5 ML SYRINGE
INTRAMUSCULAR | Status: DC | PRN
  Administered 2021-09-01: 20 mg via INTRAVENOUS

## 2021-09-01 MED ORDER — SUGAMMADEX SODIUM 200 MG/2ML IV SOLN
INTRAVENOUS | Status: DC | PRN
Start: 2021-09-01 — End: 2021-09-01
  Administered 2021-09-01: 100 mg via INTRAVENOUS
  Administered 2021-09-01: 60 mg via INTRAVENOUS

## 2021-09-01 MED ORDER — FENTANYL CITRATE (PF) 100 MCG/2ML IJ SOLN: 50.0000 ug | Freq: Once | INTRAMUSCULAR | Status: AC

## 2021-09-01 MED ORDER — DIPHENHYDRAMINE HCL 25 MG PO CAPS
25.0000 mg | ORAL_CAPSULE | Freq: Every day | ORAL | Status: DC
  Administered 2021-09-01: 25 mg via ORAL
  Filled 2021-09-01: qty 1

## 2021-09-01 MED ORDER — FENTANYL CITRATE (PF) 250 MCG/5ML IJ SOLN
INTRAMUSCULAR | Status: DC | PRN
  Administered 2021-09-01 (×2): 50 ug via INTRAVENOUS

## 2021-09-01 MED ORDER — METHOCARBAMOL 1000 MG/10ML IJ SOLN
500.0000 mg | Freq: Four times a day (QID) | INTRAVENOUS | Status: DC | PRN
  Filled 2021-09-01: qty 5

## 2021-09-01 MED ORDER — OXYCODONE HCL 5 MG PO TABS: 5.0000 mg | ORAL_TABLET | Freq: Once | ORAL | Status: DC | PRN

## 2021-09-01 MED ORDER — EPHEDRINE SULFATE-NACL 50-0.9 MG/10ML-% IV SOSY
PREFILLED_SYRINGE | INTRAVENOUS | Status: DC | PRN
  Administered 2021-09-01 (×3): 5 mg via INTRAVENOUS

## 2021-09-01 MED ORDER — HYDROMORPHONE HCL 1 MG/ML IJ SOLN: 0.5000 mg | INTRAMUSCULAR | Status: DC | PRN

## 2021-09-01 MED ORDER — VANCOMYCIN HCL 1000 MG IV SOLR
INTRAVENOUS | Status: DC | PRN
  Administered 2021-09-01: 1000 mg via TOPICAL

## 2021-09-01 MED ORDER — ACETAMINOPHEN 500 MG PO TABS
1000.0000 mg | ORAL_TABLET | Freq: Once | ORAL | Status: AC
  Administered 2021-09-01: 1000 mg via ORAL
  Filled 2021-09-01: qty 2

## 2021-09-01 MED ORDER — DOCUSATE SODIUM 100 MG PO CAPS
100.0000 mg | ORAL_CAPSULE | Freq: Two times a day (BID) | ORAL | Status: DC
  Administered 2021-09-01 – 2021-09-02 (×3): 100 mg via ORAL
  Filled 2021-09-01 (×3): qty 1

## 2021-09-01 MED ORDER — CEFAZOLIN SODIUM-DEXTROSE 2-4 GM/100ML-% IV SOLN
2.0000 g | INTRAVENOUS | Status: AC
  Administered 2021-09-01: 2 g via INTRAVENOUS
  Filled 2021-09-01: qty 100

## 2021-09-01 MED ORDER — CHLORHEXIDINE GLUCONATE 0.12 % MT SOLN
15.0000 mL | Freq: Once | OROMUCOSAL | Status: AC
  Administered 2021-09-01: 15 mL via OROMUCOSAL
  Filled 2021-09-01: qty 15

## 2021-09-01 MED ORDER — MEPERIDINE HCL 25 MG/ML IJ SOLN: 6.2500 mg | INTRAMUSCULAR | Status: DC | PRN

## 2021-09-01 MED ORDER — LIDOCAINE 2% (20 MG/ML) 5 ML SYRINGE
INTRAMUSCULAR | Status: AC
  Filled 2021-09-01: qty 5

## 2021-09-01 MED ORDER — PHENYLEPHRINE 40 MCG/ML (10ML) SYRINGE FOR IV PUSH (FOR BLOOD PRESSURE SUPPORT)
PREFILLED_SYRINGE | INTRAVENOUS | Status: DC | PRN
Start: 2021-09-01 — End: 2021-09-01
  Administered 2021-09-01: 120 ug via INTRAVENOUS
  Administered 2021-09-01 (×2): 80 ug via INTRAVENOUS
  Administered 2021-09-01: 120 ug via INTRAVENOUS

## 2021-09-01 MED ORDER — LEVOTHYROXINE SODIUM 50 MCG PO TABS
50.0000 ug | ORAL_TABLET | Freq: Every day | ORAL | Status: DC
  Administered 2021-09-02: 50 ug via ORAL
  Filled 2021-09-01: qty 1

## 2021-09-01 MED ORDER — OXYCODONE HCL 5 MG PO TABS
5.0000 mg | ORAL_TABLET | ORAL | Status: DC | PRN
  Administered 2021-09-01 – 2021-09-02 (×4): 5 mg via ORAL
  Filled 2021-09-01 (×4): qty 1

## 2021-09-01 MED ORDER — PHENOL 1.4 % MT LIQD: 1.0000 | OROMUCOSAL | Status: DC | PRN

## 2021-09-01 MED ORDER — BUPIVACAINE-EPINEPHRINE (PF) 0.5% -1:200000 IJ SOLN
INTRAMUSCULAR | Status: DC | PRN
  Administered 2021-09-01: 10 mL via PERINEURAL

## 2021-09-01 MED ORDER — LORATADINE 10 MG PO TABS
10.0000 mg | ORAL_TABLET | Freq: Every day | ORAL | Status: DC
  Administered 2021-09-02: 10 mg via ORAL
  Filled 2021-09-01: qty 1

## 2021-09-01 MED ORDER — ROCURONIUM BROMIDE 10 MG/ML (PF) SYRINGE
PREFILLED_SYRINGE | INTRAVENOUS | Status: AC
  Filled 2021-09-01: qty 10

## 2021-09-01 MED ORDER — PHENYLEPHRINE HCL-NACL 20-0.9 MG/250ML-% IV SOLN
INTRAVENOUS | Status: DC | PRN
  Administered 2021-09-01: 40 ug/min via INTRAVENOUS

## 2021-09-01 MED ORDER — ONDANSETRON HCL 4 MG/2ML IJ SOLN
INTRAMUSCULAR | Status: AC
  Filled 2021-09-01: qty 2

## 2021-09-01 MED ORDER — DEXAMETHASONE SODIUM PHOSPHATE 10 MG/ML IJ SOLN
INTRAMUSCULAR | Status: AC
  Filled 2021-09-01: qty 1

## 2021-09-01 MED ORDER — BUPIVACAINE LIPOSOME 1.3 % IJ SUSP
INTRAMUSCULAR | Status: DC | PRN
  Administered 2021-09-01: 10 mL via PERINEURAL

## 2021-09-01 MED ORDER — METOCLOPRAMIDE HCL 5 MG/ML IJ SOLN: 5.0000 mg | Freq: Three times a day (TID) | INTRAMUSCULAR | Status: DC | PRN

## 2021-09-01 MED ORDER — ONDANSETRON HCL 4 MG PO TABS: 4.0000 mg | ORAL_TABLET | Freq: Four times a day (QID) | ORAL | Status: DC | PRN

## 2021-09-01 MED ORDER — MIDAZOLAM HCL 2 MG/2ML IJ SOLN
INTRAMUSCULAR | Status: AC
Start: 2021-09-01 — End: 2021-09-01
  Administered 2021-09-01: 1 mg via INTRAVENOUS
  Filled 2021-09-01: qty 2

## 2021-09-01 MED ORDER — FENTANYL CITRATE (PF) 250 MCG/5ML IJ SOLN
INTRAMUSCULAR | Status: AC
  Filled 2021-09-01: qty 5

## 2021-09-01 MED ORDER — TAMSULOSIN HCL 0.4 MG PO CAPS
0.4000 mg | ORAL_CAPSULE | Freq: Every day | ORAL | Status: DC
  Administered 2021-09-01: 0.4 mg via ORAL
  Filled 2021-09-01: qty 1

## 2021-09-01 MED ORDER — LISINOPRIL 10 MG PO TABS
10.0000 mg | ORAL_TABLET | Freq: Every day | ORAL | Status: DC
Start: 2021-09-02 — End: 2021-09-02
  Administered 2021-09-02: 10 mg via ORAL
  Filled 2021-09-01: qty 1

## 2021-09-01 MED ORDER — TRANEXAMIC ACID-NACL 1000-0.7 MG/100ML-% IV SOLN
1000.0000 mg | INTRAVENOUS | Status: AC
  Administered 2021-09-01: 1000 mg via INTRAVENOUS
  Filled 2021-09-01: qty 100

## 2021-09-01 MED ORDER — VANCOMYCIN HCL 1000 MG IV SOLR
INTRAVENOUS | Status: AC
  Filled 2021-09-01: qty 20

## 2021-09-01 MED ORDER — ROCURONIUM BROMIDE 10 MG/ML (PF) SYRINGE
PREFILLED_SYRINGE | INTRAVENOUS | Status: DC | PRN
  Administered 2021-09-01: 60 mg via INTRAVENOUS

## 2021-09-01 MED ORDER — MIDAZOLAM HCL 2 MG/2ML IJ SOLN: 1.0000 mg | Freq: Once | INTRAMUSCULAR | Status: AC

## 2021-09-01 MED ORDER — POVIDONE-IODINE 10 % EX SWAB
2.0000 "application " | Freq: Once | CUTANEOUS | Status: AC
  Administered 2021-09-01: 2 via TOPICAL

## 2021-09-01 MED ORDER — APIXABAN 5 MG PO TABS
5.0000 mg | ORAL_TABLET | Freq: Two times a day (BID) | ORAL | Status: DC
  Administered 2021-09-02: 5 mg via ORAL
  Filled 2021-09-01 (×2): qty 1

## 2021-09-01 MED ORDER — OXYCODONE HCL 5 MG/5ML PO SOLN: 5.0000 mg | Freq: Once | ORAL | Status: DC | PRN

## 2021-09-01 MED ORDER — PROMETHAZINE HCL 25 MG/ML IJ SOLN: 6.2500 mg | INTRAMUSCULAR | Status: DC | PRN

## 2021-09-01 MED ORDER — ACETAMINOPHEN 500 MG PO TABS
1000.0000 mg | ORAL_TABLET | Freq: Four times a day (QID) | ORAL | Status: AC
  Administered 2021-09-01 – 2021-09-02 (×4): 1000 mg via ORAL
  Filled 2021-09-01 (×4): qty 2

## 2021-09-01 MED ORDER — METHOCARBAMOL 500 MG PO TABS
500.0000 mg | ORAL_TABLET | Freq: Four times a day (QID) | ORAL | Status: DC | PRN
  Administered 2021-09-01 – 2021-09-02 (×2): 500 mg via ORAL
  Filled 2021-09-01 (×2): qty 1

## 2021-09-01 SURGICAL SUPPLY — 80 items
ALCOHOL 70% 16 OZ (MISCELLANEOUS) ×2 IMPLANT
BAG COUNTER SPONGE SURGICOUNT (BAG) ×2 IMPLANT
BASEPLATE AUG MED W-TAPER (Plate) ×1 IMPLANT
BEARING HUMERAL SHLDER 36M STD (Shoulder) IMPLANT
BIT DRILL TWIST 2.7 (BIT) ×1 IMPLANT
BLADE SAW SGTL 13X75X1.27 (BLADE) ×2 IMPLANT
CHLORAPREP W/TINT 26 (MISCELLANEOUS) ×2 IMPLANT
CLSR STERI-STRIP ANTIMIC 1/2X4 (GAUZE/BANDAGES/DRESSINGS) ×1 IMPLANT
COOLER ICEMAN CLASSIC (MISCELLANEOUS) ×2 IMPLANT
COVER SURGICAL LIGHT HANDLE (MISCELLANEOUS) ×2 IMPLANT
DRAPE INCISE IOBAN 66X45 STRL (DRAPES) ×2 IMPLANT
DRAPE U-SHAPE 47X51 STRL (DRAPES) ×4 IMPLANT
DRSG AQUACEL AG ADV 3.5X10 (GAUZE/BANDAGES/DRESSINGS) ×2 IMPLANT
ELECT BLADE 4.0 EZ CLEAN MEGAD (MISCELLANEOUS) ×2
ELECT REM PT RETURN 9FT ADLT (ELECTROSURGICAL) ×2
ELECTRODE BLDE 4.0 EZ CLN MEGD (MISCELLANEOUS) ×1 IMPLANT
ELECTRODE REM PT RTRN 9FT ADLT (ELECTROSURGICAL) ×1 IMPLANT
GAUZE SPONGE 4X4 12PLY STRL LF (GAUZE/BANDAGES/DRESSINGS) ×2 IMPLANT
GLENOID SPHERE 36+6 (Joint) ×1 IMPLANT
GLOVE SRG 8 PF TXTR STRL LF DI (GLOVE) ×1 IMPLANT
GLOVE SURG LTX SZ7 (GLOVE) ×2 IMPLANT
GLOVE SURG LTX SZ8 (GLOVE) ×2 IMPLANT
GLOVE SURG UNDER POLY LF SZ7 (GLOVE) ×2 IMPLANT
GLOVE SURG UNDER POLY LF SZ8 (GLOVE) ×1
GOWN STRL REUS W/ TWL LRG LVL3 (GOWN DISPOSABLE) ×1 IMPLANT
GOWN STRL REUS W/ TWL XL LVL3 (GOWN DISPOSABLE) ×1 IMPLANT
GOWN STRL REUS W/TWL LRG LVL3 (GOWN DISPOSABLE) ×1
GOWN STRL REUS W/TWL XL LVL3 (GOWN DISPOSABLE) ×1
GUIDE MODEL REV SHLD LT (ORTHOPEDIC DISPOSABLE SUPPLIES) ×1 IMPLANT
HYDROGEN PEROXIDE 16OZ (MISCELLANEOUS) ×2 IMPLANT
JET LAVAGE IRRISEPT WOUND (IRRIGATION / IRRIGATOR) ×2
KIT BASIN OR (CUSTOM PROCEDURE TRAY) ×2 IMPLANT
KIT TURNOVER KIT B (KITS) ×2 IMPLANT
LAVAGE JET IRRISEPT WOUND (IRRIGATION / IRRIGATOR) ×1 IMPLANT
LOOP VESSEL MAXI BLUE (MISCELLANEOUS) ×2 IMPLANT
MANIFOLD NEPTUNE II (INSTRUMENTS) ×2 IMPLANT
NDL SUT 6 .5 CRC .975X.05 MAYO (NEEDLE) IMPLANT
NDL TAPERED W/ NITINOL LOOP (MISCELLANEOUS) ×1 IMPLANT
NEEDLE MAYO TAPER (NEEDLE)
NEEDLE TAPERED W/ NITINOL LOOP (MISCELLANEOUS) ×2 IMPLANT
NS IRRIG 1000ML POUR BTL (IV SOLUTION) ×2 IMPLANT
PACK SHOULDER (CUSTOM PROCEDURE TRAY) ×2 IMPLANT
PAD ARMBOARD 7.5X6 YLW CONV (MISCELLANEOUS) ×4 IMPLANT
PAD COLD SHLDR WRAP-ON (PAD) ×2 IMPLANT
PASSER SUT SWANSON 36MM LOOP (INSTRUMENTS) ×3 IMPLANT
PIN STEINMANN THREADED TIP (PIN) ×1 IMPLANT
PIN THREADED REVERSE (PIN) ×2 IMPLANT
REAMER GUIDE BUSHING SURG DISP (MISCELLANEOUS) ×1 IMPLANT
REAMER GUIDE W/SCREW AUG (MISCELLANEOUS) ×1 IMPLANT
RESTRAINT HEAD UNIVERSAL NS (MISCELLANEOUS) ×2 IMPLANT
SCREW BONE CORT 6.5X35MM (Screw) ×1 IMPLANT
SCREW BONE LOCKING 4.75X30X3.5 (Screw) ×2 IMPLANT
SCREW LOCKING 4.75MMX15MM (Screw) ×1 IMPLANT
SCREW LOCKING NS 4.75MMX20MM (Screw) ×1 IMPLANT
SHOULDER HUMERAL BEAR 36M STD (Shoulder) ×2 IMPLANT
SLING ARM IMMOBILIZER LRG (SOFTGOODS) ×2 IMPLANT
SOL PREP POV-IOD 4OZ 10% (MISCELLANEOUS) ×2 IMPLANT
SPONGE T-LAP 18X18 ~~LOC~~+RFID (SPONGE) ×2 IMPLANT
STEM MINI LONG 15MM 83MM (Stem) ×1 IMPLANT
STRIP CLOSURE SKIN 1/2X4 (GAUZE/BANDAGES/DRESSINGS) ×2 IMPLANT
SUCTION FRAZIER HANDLE 10FR (MISCELLANEOUS) ×1
SUCTION TUBE FRAZIER 10FR DISP (MISCELLANEOUS) ×1 IMPLANT
SUT BROADBAND TAPE 2PK 1.5 (SUTURE) ×3 IMPLANT
SUT FIBERWIRE #2 38 T-5 BLUE (SUTURE)
SUT MAXBRAID (SUTURE) IMPLANT
SUT MNCRL AB 3-0 PS2 18 (SUTURE) ×2 IMPLANT
SUT SILK 2 0 TIES 10X30 (SUTURE) ×2 IMPLANT
SUT VIC AB 0 CT1 27 (SUTURE) ×4
SUT VIC AB 0 CT1 27XBRD ANBCTR (SUTURE) ×4 IMPLANT
SUT VIC AB 1 CT1 27 (SUTURE) ×2
SUT VIC AB 1 CT1 27XBRD ANBCTR (SUTURE) ×2 IMPLANT
SUT VIC AB 2-0 CT1 27 (SUTURE) ×3
SUT VIC AB 2-0 CT1 TAPERPNT 27 (SUTURE) ×3 IMPLANT
SUT VICRYL 0 UR6 27IN ABS (SUTURE) ×4 IMPLANT
SUTURE FIBERWR #2 38 T-5 BLUE (SUTURE) IMPLANT
TOWEL GREEN STERILE (TOWEL DISPOSABLE) ×2 IMPLANT
TRAY FOL W/BAG SLVR 16FR STRL (SET/KITS/TRAYS/PACK) IMPLANT
TRAY FOLEY W/BAG SLVR 16FR LF (SET/KITS/TRAYS/PACK)
TRAY HUM REV SHOULDER STD +6 (Shoulder) ×1 IMPLANT
WATER STERILE IRR 1000ML POUR (IV SOLUTION) ×2 IMPLANT

## 2021-09-01 NOTE — Anesthesia Postprocedure Evaluation (Signed)
Anesthesia Post Note  Patient: Keith [name].  Procedure(s) Performed: LEFT REVERSE SHOULDER ARTHROPLASTY (Left: Shoulder)     Patient location during evaluation: PACU Anesthesia Type: General Level of consciousness: patient cooperative, awake and alert and oriented Pain management: pain level controlled Vital Signs Assessment: post-procedure vital signs reviewed and stable Respiratory status: spontaneous breathing, nonlabored ventilation and respiratory function stable Cardiovascular status: blood pressure returned to baseline and stable Postop Assessment: no apparent nausea or vomiting Anesthetic complications: no   No notable events documented.  Last Vitals:  Vitals:   09/01/21 1305 09/01/21 1332  BP: 112/67 130/70  Pulse: 65 62  Resp: 13 20  Temp: 36.6 C   SpO2: 96% 98%    Last Pain:  Vitals:   09/01/21 1300  TempSrc:   PainSc: Asleep                 Socorro Ebron,E. Acy Orsak

## 2021-09-01 NOTE — Anesthesia Procedure Notes (Signed)
Anesthesia Regional Block: Interscalene brachial plexus block   Pre-Anesthetic Checklist: , timeout performed,  Correct Patient, Correct Site, Correct Laterality,  Correct Procedure, Correct Position, site marked,  Risks and benefits discussed,  Surgical consent,  Pre-op evaluation,  At surgeon's request and post-op pain management  Laterality: Left and Upper  Prep: chloraprep       Needles:  Injection technique: Single-shot  Needle Type: Echogenic Needle     Needle Length: 9cm  Needle Gauge: 21     Additional Needles:   Procedures:,,,, ultrasound used (permanent image in chart),,    Narrative:  Start time: 09/01/2021 7:10 AM End time: 09/01/2021 7:16 AM Injection made incrementally with aspirations every 5 mL.  Performed by: Personally  Anesthesiologist: Annye Asa, MD  Additional Notes: Pt identified in Holding room.  Monitors applied. Working IV access confirmed. Sterile prep L shoulder.  #21ga ECHOgenic Arrow block needle to interscalene brachial plexus with US guidance.  10cc 0.5% Bupivacaine with 1:200k epi, Exparel injected incrementally after negative test dose.  Patient asymptomatic, VSS, no heme aspirated, tolerated well.   Jenita Seashore, MD

## 2021-09-01 NOTE — Anesthesia Procedure Notes (Signed)
Procedure Name: Intubation Date/Time: 09/01/2021 8:37 AM Performed by: Jenne Campus, CRNA Pre-anesthesia Checklist: Patient identified, Emergency Drugs available, Suction available and Patient being monitored Patient Re-evaluated:Patient Re-evaluated prior to induction Oxygen Delivery Method: Circle System Utilized Preoxygenation: Pre-oxygenation with 100% oxygen Induction Type: IV induction Ventilation: Mask ventilation without difficulty Laryngoscope Size: Miller and 3 Grade View: Grade I Tube type: Oral Tube size: 7.5 mm Number of attempts: 1 Airway Equipment and Method: Stylet Placement Confirmation: ETT inserted through vocal cords under direct vision, positive ETCO2 and breath sounds checked- equal and bilateral Secured at: 22 cm Tube secured with: Tape Dental Injury: Teeth and Oropharynx as per pre-operative assessment

## 2021-09-01 NOTE — Transfer of Care (Signed)
Immediate Anesthesia Transfer of Care Note  Patient: Keith [name].  Procedure(s) Performed: LEFT REVERSE SHOULDER ARTHROPLASTY (Left: Shoulder)  Patient Location: PACU  Anesthesia Type:General and Regional  Level of Consciousness: awake, oriented and patient cooperative  Airway & Oxygen Therapy: Patient Spontanous Breathing and Patient connected to nasal cannula oxygen  Post-op Assessment: Report given to RN and Post -op Vital signs reviewed and stable  Post vital signs: Reviewed  Last Vitals:  Vitals Value Taken Time  BP 109/47 09/01/21 1217  Temp    Pulse 66 09/01/21 1228  Resp 13 09/01/21 1228  SpO2 89 % 09/01/21 1228  Vitals shown include unvalidated device data.  Last Pain:  Vitals:   09/01/21 0622  TempSrc: Oral         Complications: No notable events documented.

## 2021-09-01 NOTE — H&P (Signed)
Keith [name]. is an 81 y.o. male.   Chief Complaint: Left shoulder pain HPI: Keith [name] is an 81 year old patient with left shoulder arthritis.  He has debilitating pain on a daily basis.  CT scan shows severe arthritis of the glenohumeral joint with maintenance of enough glenoid bone stock that reverse shoulder replacement should be feasible.  Rotator cuff tendons appear to be intact with no muscle atrophy.  Patient has a wife at home.  He also works on a farm.  He is able to be off blood thinners for his surgery.  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   Dysrhythmia    GERD (gastroesophageal reflux disease)    History of aortic valve replacement with bioprosthetic valve    Aortic regurgitation   History of chicken pox    History of kidney stones    History of skin cancer    History of stroke 2011   Hypothyroidism    Kidney stone    Seasonal allergies    Stroke [facility])    after heart surgery 2010    Past Surgical History:  Procedure Laterality Date   AORTIC VALVE REPLACEMENT  2009   Bioprosthetic AVR and root replacement   BACK SURGERY     total of 4 back surgeries   CATARACT EXTRACTION, BILATERAL     COLONOSCOPY     CYSTOSCOPY WITH RETROGRADE PYELOGRAM, URETEROSCOPY AND STENT PLACEMENT Bilateral 10/01/2019   Procedure: CYSTOSCOPY WITH RETROGRADE PYELOGRAM, URETEROSCOPY AND STENT PLACEMENT;  Surgeon: Irine Seal, MD;  Location: [facility];  Service: Urology;  Laterality: Bilateral;   EPIGASTRIC HERNIA REPAIR     HERNIA REPAIR     LUMBAR LAMINECTOMY/DECOMPRESSION MICRODISCECTOMY Right 06/01/2017   Procedure: EXTRAFORAMINAL MICRODISCECTOMY LUMBAR FIVE- SACRAL ONE, RIGHT;  Surgeon: Eustace Moore, MD;  Location: [place];  Service: Neurosurgery;  Laterality: Right;   LUMBAR  LAMINECTOMY/DECOMPRESSION MICRODISCECTOMY Bilateral 09/04/2018   Procedure: Laminectomy and Foraminotomy - Lumbar two-three bilateral;  Surgeon: Eustace Moore, MD;  Location: [place];  Service: Neurosurgery;  Laterality: Bilateral;   TOTAL HIP ARTHROPLASTY Left 12/19/2020   Procedure: LEFT TOTAL HIP ARTHROPLASTY ANTERIOR APPROACH;  Surgeon: Mcarthur Rossetti, MD;  Location: [facility];  Service: Orthopedics;  Laterality: Left;   TRANSFORAMINAL LUMBAR INTERBODY FUSION (TLIF) WITH PEDICLE SCREW FIXATION 1 LEVEL Left 06/15/2019   Procedure: Decompression and Instrumention Fusion Lumbar Four- Five;  Surgeon: Eustace Moore, MD;  Location: [place];  Service: Neurosurgery;  Laterality: Left;  Decompression and Instrumention Fusion Lumbar Four- Five    Family History  Problem Relation Age of Onset   Colon cancer Neg Hx    Esophageal cancer Neg Hx    Rectal cancer Neg Hx    Stomach cancer Neg Hx    Social History:  reports that he has never smoked. He has never used smokeless tobacco. He reports current alcohol use. He reports that he does not use drugs.  Allergies:  Allergies  Allergen Reactions   Tape Rash    Tears skin     Medications Prior to Admission  Medication Sig Dispense Refill   acetaminophen (TYLENOL) 650 MG CR tablet Take 1,300 mg by mouth every 8 (eight) hours as needed for pain.     cetirizine (ZYRTEC) 10 MG tablet  Take 10 mg by mouth daily.     diphenhydrAMINE (BENADRYL) 25 MG tablet Take 25 mg by mouth at bedtime.     levothyroxine (SYNTHROID) 50 MCG tablet TAKE 1 TABLET BY MOUTH ONCE A DAY BEFORE BREAKFAST 90 tablet 2   lisinopril-hydrochlorothiazide (ZESTORETIC) 10-12.5 MG tablet Take 1 tablet by mouth daily 90 tablet 2   oxyCODONE-acetaminophen (PERCOCET/ROXICET) 5-325 MG tablet Take 1 - 2 tablets by mouth every 6 hours as needed for severe pain. (Patient taking differently: Take 1 tablet by mouth every 6 (six) hours as needed for severe pain.) 20 tablet 0   polyethylene  glycol (MIRALAX / GLYCOLAX) 17 g packet Take 17 g by mouth daily as needed for moderate constipation.     tamsulosin (FLOMAX) 0.4 MG CAPS capsule Take 1 capsule by mouth once daily (Patient taking differently: Take 0.4 mg by mouth at bedtime.) 90 capsule 3   apixaban (ELIQUIS) 5 MG TABS tablet Take 1 tablet (5 mg total) by mouth 2 (two) times daily. 180 tablet 3   cyclobenzaprine (FLEXERIL) 5 MG tablet Take 1 tablet (5 mg total) by mouth 3 (three) times daily as needed for muscle spasms. 30 tablet 0   famotidine (PEPCID) 10 MG tablet Take 10 mg by mouth daily as needed for heartburn or indigestion.      No results found for this or any previous visit (from the past 48 hour(s)). No results found.  Review of Systems  Musculoskeletal:  Positive for arthralgias.  All other systems reviewed and are negative.  Blood pressure (!) 160/87, pulse 88, temperature 98.3 F (36.8 C), temperature source Oral, resp. rate 18, height 5\' 11"  (1.803 m), weight 81.2 kg, SpO2 98 %. Physical Exam Vitals reviewed.  HENT:     Head: Normocephalic.     Nose: Nose normal.     Mouth/Throat:     Mouth: Mucous membranes are moist.  Eyes:     Pupils: Pupils are equal, round, and reactive to light.  Cardiovascular:     Rate and Rhythm: Normal rate.     Pulses: Normal pulses.  Pulmonary:     Effort: Pulmonary effort is normal.  Abdominal:     General: Abdomen is flat.  Musculoskeletal:     Cervical back: Normal range of motion.  Skin:    General: Skin is warm.     Capillary Refill: Capillary refill takes less than 2 seconds.  Neurological:     General: No focal deficit present.     Mental Status: He is alert.  Psychiatric:        Mood and Affect: Mood normal.   Ortho exam demonstrates good rotator cuff strength on the left infraspinatus supraspinatus subscap muscle testing.  Has diminished range of motion passively to about 20 degrees of external rotation 80 degrees of AB duction and just over 90 degrees of  forward flexion.  Deltoid is functional.  Radial pulse intact.  Motor or sensory function to the hand intact.    Assessment/Plan Impression is left shoulder arthritis.  Plan is left reverse shoulder replacement.  The risk and benefits are discussed with the patient including but not limited to infection nerve vessel damage dislocation as well as incomplete restoration of function and incomplete pain relief.  Patient would like to get the shoulder fixed so he can get the knee fixed which is also arthritic.  I think with his rotator cuff function instability is possible but less likely if we can maintain his rotator cuff.  He is  intent on remaining active in the farm.  We discussed lifting restrictions inherent with shoulder replacement.  We also discussed the fact that he has been the underlying medical conditions which may adversely affect his final outcome and perioperative complication rate.  All questions answered    Anderson Malta, MD 09/01/2021, 7:05 AM

## 2021-09-01 NOTE — Brief Op Note (Signed)
° °  09/01/2021  12:03 PM  PATIENT:  Keith [name].  81 y.o. male  PRE-OPERATIVE DIAGNOSIS:  left shoulder osteoarthritis  POST-OPERATIVE DIAGNOSIS:  left shoulder osteoarthritis  PROCEDURE:  Procedure(s): LEFT REVERSE SHOULDER ARTHROPLASTY  SURGEON:  Surgeon(s): Meredith Pel, MD  ASSISTANT: magnant pa  ANESTHESIA:   general  EBL: 150 ml    Total I/O In: 1600 [I.V.:1400; IV Piggyback:200] Out: 550 [Urine:300; Blood:250]  BLOOD ADMINISTERED: none  DRAINS: none   LOCAL MEDICATIONS USED:  vanco  SPECIMEN:  No Specimen  COUNTS:  YES  TOURNIQUET:  * No tourniquets in log *  DICTATION: .Other Dictation: Dictation Number [id number]  PLAN OF CARE: Admit for overnight observation  PATIENT DISPOSITION:  PACU - hemodynamically stable

## 2021-09-01 NOTE — Op Note (Signed)
NAME: Keith [name], Keith [name] MEDICAL RECORD NO: [id number] ACCOUNT NO: [id number] DATE OF BIRTH: 1941/07/03 FACILITY: [facility] LOCATION: [facility] PHYSICIAN: Yetta Barre. Marlou Sa, MD  Operative Report   DATE OF PROCEDURE: 09/01/2021  PREOPERATIVE DIAGNOSIS:  Left shoulder arthritis.  POSTOPERATIVE DIAGNOSIS:  Left shoulder arthritis.  PROCEDURE:  Left shoulder reverse shoulder replacement using [employer] components medium augmented baseplate with one central compression screw, 4 peripheral locking screws, 36+6 offset glenosphere with a size 15 mini humeral stem, +6 taper offset, mini  humeral tray with 36 mm standard bearing.  SURGEON:  Yetta Barre. Marlou Sa, MD  ASSISTANT:  Annie Main, PA  INDICATIONS:  The patient is an 81 year old with left shoulder pain refractory to nonoperative management, who presents for operative management after explanation of risks and benefits.  DESCRIPTION OF PROCEDURE:  The patient was brought to the operating room where general anesthetic was induced.  Preoperative antibiotics administered.  Timeout was called.  The patient was placed in the beach chair position with his head in neutral  position.  Left arm had preoperative range of motion of 60/75/95.  Left shoulder, arm and hand prescrubbed with hydrogen peroxide followed by alcohol and then Betadine, which was allowed to air dry, prepped with ChloraPrep solution and draped in a  sterile manner.  Ioban used to cover the operative field.  Timeout was called.  Deltopectoral approach was made to the shoulder.  Skin and subcutaneous tissue sharply divided.  Cephalic vein mobilized medially.  The subacromial and subdeltoid space was  mobilized manually.  Kolbel retractors placed.  Deltoid elevated manually off its anterior attachment.  Next, the biceps tendon was identified.  Biceps tendon was tenodesed to the pec which was released about 1.5 cm from its superior attachment.   Tenodesis performed using 4-0 Vicryl sutures.  Rotator  interval was then opened up to the base of the coracoid.  Circumflex vessels were ligated using silk ligature.  Axillary nerve was visualized and palpated and protected at all times during the case.   Next, the stump of the biceps was tagged.  The subscap was then detached from the lesser tuberosity using a 15 blade.  Dissection was then performed around to the 7 o'clock position.  Using a Cobb elevator as well as blunt dissection and electrocautery.   About 2 cm of the capsule elevated off the medial humeral shaft.  Head was dislocated.  Subscap was attached, but it had significant tendinitis and intrinsic degenerative changes.  The supraspinatus, infraspinatus intact.  Next, with the head  dislocated the humerus was broached up to a size 15.  Humeral head cut was made in about 30 degrees of version, which correlated to the patient's native anatomy.  The patient had a very large humeral head.  Broaching was then performed up to a size 15  and that broach was left in place and capped.  Attention was then directed towards the glenoid.  Next, using the patient-specific instrumentation after circumferentially excising the labrum the guide was placed.  Care was taken to avoid injury to the  axillary nerve.  The guide was placed and reaming was performed.  A reaming was performed, both for the augment as well as a standard reaming.  Next, trial baseplate was placed and very good contact was achieved.  True baseplate was placed in the  appropriate rotation and compression was achieved using one single compression screw and then augmented with 4 peripheral locking screws.  Next, a visual trialing was performed by starting with the +3, then  a +6 offset trial glenosphere.  This was  reduced with the +6 offset tray and standard bearing.  With those components in position the patient had excellent stability.  Difficult to reduce and re-dislocate.  It was "two fingers tight."  Next, the trial components were removed.   The true  glenosphere put into place with about 1.5 mm inferior offset.  A good Secur-Fit was obtained on the [facility] taper.  Next 6 suture tapes were placed through the lateral aspect of the lesser tuberosity.  IrriSept solution utilized after the incision, after  the arthrotomy at multiple times during the case.  IrriSept also placed into the humeral canal followed by vancomycin powder.  The 15 mini humeral stem was then placed with very good solid press fit obtained.  Next, a repeat reduction was performed with  the +6 offset tray and standard bearing.  This gave very good stability with external rotation to about 70 degrees, forward flexion and abduction both above 90.  No instability with adduction and extension.  True components then placed.  Same stability  parameters maintained.  Axillary nerve was again palpated and found to be intact.  Thorough irrigation with 3 liters of irrigating solution performed.  Subscap was then repaired back to the lesser tuberosity using the #6 suture tapes tied in an Nice  knots.  This was done with the arm in about 35 degrees of external rotation.  A thorough irrigation again performed in the joint beginning with saline followed by IrriSept solution and then vancomycin powder placed into the joint.  The rotator interval  was then closed using #1 Vicryl suture with the arm externally rotated 30 degrees.  Thorough irrigation again performed with irrigating solution, IrriSept solution and vancomycin powder placed.  Deltopectoral interval closed using #1 Vicryl suture  followed by interrupted inverted 0 Vicryl suture, 2-0 Vicryl suture, and 3-0 Monocryl with Steri-Strips and Aquacel dressing applied.  The patient was then placed in a shoulder sling.  He tolerated the procedure well without immediate complications,  transferred to the recovery room in stable condition.  Luke's assistance was required at all times for retraction, opening, closing, mobilization of tissue.  His  assistance was a medical necessity.   PUS D: 09/01/2021 12:12:06 pm T: 09/01/2021 4:41:00 pm  JOB: [id number]/ [id number]

## 2021-09-02 ENCOUNTER — Other Ambulatory Visit (HOSPITAL_COMMUNITY): Payer: Self-pay

## 2021-09-02 ENCOUNTER — Encounter (HOSPITAL_COMMUNITY): Payer: Self-pay | Admitting: Orthopedic Surgery

## 2021-09-02 DIAGNOSIS — I4891 Unspecified atrial fibrillation: Secondary | ICD-10-CM | POA: Diagnosis not present

## 2021-09-02 DIAGNOSIS — Z7901 Long term (current) use of anticoagulants: Secondary | ICD-10-CM | POA: Diagnosis not present

## 2021-09-02 DIAGNOSIS — Z96642 Presence of left artificial hip joint: Secondary | ICD-10-CM | POA: Diagnosis not present

## 2021-09-02 DIAGNOSIS — E039 Hypothyroidism, unspecified: Secondary | ICD-10-CM | POA: Diagnosis not present

## 2021-09-02 DIAGNOSIS — Z79899 Other long term (current) drug therapy: Secondary | ICD-10-CM | POA: Diagnosis not present

## 2021-09-02 DIAGNOSIS — M19012 Primary osteoarthritis, left shoulder: Secondary | ICD-10-CM | POA: Diagnosis not present

## 2021-09-02 DIAGNOSIS — Z85828 Personal history of other malignant neoplasm of skin: Secondary | ICD-10-CM | POA: Diagnosis not present

## 2021-09-02 MED ORDER — METHOCARBAMOL 500 MG PO TABS
500.0000 mg | ORAL_TABLET | Freq: Three times a day (TID) | ORAL | 0 refills | Status: DC | PRN
  Filled 2021-09-02: qty 30, 10d supply, fill #0

## 2021-09-02 MED ORDER — OXYCODONE-ACETAMINOPHEN 5-325 MG PO TABS
1.0000 | ORAL_TABLET | ORAL | 0 refills | Status: DC | PRN
  Filled 2021-09-02: qty 25, 5d supply, fill #0

## 2021-09-02 NOTE — Evaluation (Signed)
Occupational Therapy Evaluation Patient Details Name: Keith [name]. MRN: [id number] DOB: 11-Nov-1940 Today's Date: 09/02/2021   History of Present Illness 81 y.o. M admitted on 09/01/21 for a L shoulder reverse shoulder replacement. PMH significant for Arthritis, GERD, CVA (2010), AVR, L THA, and multiple spinal surgires.   Clinical Impression   Pt admitted for concerns listed above. 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 At this time, pt is limited by pain and NWB status of his LUE. Pt requiring frequent cues and reminders for following his shoulder precautions. He is able to complete BADL's at a min guard level, as well as functional mobility using no DME. Recommending pt follow his surgeons plan for follow up therapies. OT will follow acutely to further address shoulder precautions.      Recommendations for follow up therapy are one component of a multi-disciplinary discharge planning process, led by the attending physician.  Recommendations may be updated based on patient status, additional functional criteria and insurance authorization.   Follow Up Recommendations  Follow physician's recommendations for discharge plan and follow up therapies    Assistance Recommended at Discharge Set up Supervision/Assistance  Patient can return home with the following A little help with bathing/dressing/bathroom    Functional Status Assessment  Patient has had a recent decline in their functional status and demonstrates the ability to make significant improvements in function in a reasonable and predictable amount of time.  Equipment Recommendations  None recommended by OT    Recommendations for Other Services       Precautions / Restrictions Precautions Precautions: Shoulder Shoulder Interventions: Shoulder sling/immobilizer;At all times;Off for dressing/bathing/exercises Precaution Booklet  Issued: Yes (comment) Precaution Comments: Reviewed precautions and compensatory strategies for ADL's Required Braces or Orthoses: Sling Restrictions Weight Bearing Restrictions: Yes LUE Weight Bearing: Non weight bearing      Mobility Bed Mobility Overal bed mobility: Modified Independent                  Transfers Overall transfer level: Needs assistance Equipment used: None Transfers: Sit to/from Stand Sit to Stand: Min guard           General transfer comment: NO physical assist needed, pt did struggle using RUE to power up to standing      Balance Overall balance assessment: Mild deficits observed, not formally tested                                         ADL either performed or assessed with clinical judgement   ADL Overall ADL's : Needs assistance/impaired                                       General ADL Comments: Pt requiring min guard for frequent education on following precautions     Vision Baseline Vision/History: 0 No visual deficits Ability to See in Adequate Light: 0 Adequate Patient Visual Report: No change from baseline Vision Assessment?: No apparent visual deficits     Perception     Praxis      Pertinent Vitals/Pain Pain Assessment Pain Assessment: No/denies pain     Hand Dominance Right   Extremity/Trunk Assessment Upper Extremity Assessment Upper Extremity Assessment: Defer to OT evaluation  LUE Deficits / Details: Shoulder surgery, nerve block still in effect, difficulty with fine motor tasks, limited mobility for precautions LUE: Unable to fully assess due to immobilization LUE Sensation: decreased light touch LUE Coordination: decreased gross motor;decreased fine motor   Lower Extremity Assessment Lower Extremity Assessment: Overall WFL for tasks assessed (hx of L knee pain - awaiting L TKA)   Cervical / Trunk Assessment Cervical / Trunk Assessment: Kyphotic (hx of back surgeries)    Communication Communication Communication: No difficulties   Cognition Arousal/Alertness: Awake/alert Behavior During Therapy: WFL for tasks assessed/performed Overall Cognitive Status: Within Functional Limits for tasks assessed                                       General Comments  VSS on RA, bandage in place and dry, reviewed pendulum exercises    Exercises     Shoulder Instructions      Home Living Family/patient expects to be discharged to:: Private residence Living Arrangements: Spouse/significant other Available Help at Discharge: Family Type of Home: House Home Access: Stairs to enter Technical brewer of Steps: 4-5 Entrance Stairs-Rails: Right;Left Home Layout: One level     Bathroom Shower/Tub: Occupational psychologist: Standard Bathroom Accessibility: Yes How Accessible: Accessible via walker Home Equipment: Conservation officer, nature (2 wheels);Cane - single point          Prior Functioning/Environment Prior Level of Function : Independent/Modified Independent                        OT Problem List: Decreased strength;Decreased range of motion;Decreased activity tolerance;Impaired balance (sitting and/or standing);Decreased coordination;Pain;Impaired UE functional use      OT Treatment/Interventions: Self-care/ADL training;Therapeutic exercise;Energy conservation;DME and/or AE instruction;Therapeutic activities;Patient/family education;Balance training    OT Goals(Current goals can be found in the care plan section) Acute Rehab OT Goals Patient Stated Goal: To go home OT Goal Formulation: With patient Time For Goal Achievement: 09/17/21 Potential to Achieve Goals: Good ADL Goals Pt Will Perform Grooming: with modified independence;standing Pt Will Perform Upper Body Bathing: with modified independence;with adaptive equipment;sitting Pt Will Perform Upper Body Dressing: with modified independence;sitting;with adaptive  equipment  OT Frequency: Min 2X/week    Co-evaluation              AM-PAC OT "6 Clicks" Daily Activity     Outcome Measure Help from another person eating meals?: A Little Help from another person taking care of personal grooming?: A Little Help from another person toileting, which includes using toliet, bedpan, or urinal?: A Little Help from another person bathing (including washing, rinsing, drying)?: A Little Help from another person to put on and taking off regular upper body clothing?: A Little Help from another person to put on and taking off regular lower body clothing?: A Little 6 Click Score: 18   End of Session Nurse Communication: Mobility status  Activity Tolerance: Patient tolerated treatment well Patient left: in bed;with call bell/phone within reach  OT Visit Diagnosis: Pain;Muscle weakness (generalized) (M62.81) Pain - Right/Left: Left Pain - part of body: Shoulder                Time: 4174-0814 OT Time Calculation (min): 32 min Charges:  OT General Charges $OT Visit: 1 Visit OT Evaluation $OT Eval Moderate Complexity: 1 Mod OT Treatments $Self Care/Home Management : 8-22 mins  Kenlee Vogt H., OTR/L Acute Rehabilitation  Michiah Mudry Elane Omid Deardorff 09/02/2021, 10:52 AM

## 2021-09-02 NOTE — Progress Notes (Signed)
Pt. discharged home accompanied by wife. Prescriptions and discharge instructions given with verbalization of understanding. Incision site on Lt. Shoulder with no s/s of infection - no swelling, redness, bleeding, and/or drainage noted. Patient has an appointment with Dr. Marlou Sa in 2 weeks

## 2021-09-02 NOTE — Care Management (Signed)
Plan for DC to home. No HH needs identified, unit staff will supply any DME needed through [employer]. Patient will have support from family at home.

## 2021-09-02 NOTE — Evaluation (Signed)
Physical Therapy Evaluation & Discharge Patient Details Name: Keith [name]. MRN: [id number] DOB: 20-Jul-1940 Today's Date: 09/02/2021  History of Present Illness  81 y.o. M admitted on 09/01/21 for a L shoulder reverse shoulder replacement. PMH significant for Arthritis, GERD, CVA (2010), AVR, L THA, and multiple spinal surgires.  Clinical Impression  Patient admitted following above procedure. Patient with hx of L knee pain awaiting L TKA once recovered from this procedure. Patient with mild balance deficits but able to recover without assistance. Patient functioning at modI level with no AD. Encouraged [facility] for community mobility if patient feels unsteady with long distances. No further skilled PT needs identified acutely. No PT follow up recommended at this time.        Recommendations for follow up therapy are one component of a multi-disciplinary discharge planning process, led by the attending physician.  Recommendations may be updated based on patient status, additional functional criteria and insurance authorization.  Follow Up Recommendations No PT follow up    Assistance Recommended at Discharge PRN  Patient can return home with the following       Equipment Recommendations None recommended by PT  Recommendations for Other Services       Functional Status Assessment Patient has had a recent decline in their functional status and demonstrates the ability to make significant improvements in function in a reasonable and predictable amount of time.     Precautions / Restrictions Precautions Precautions: Shoulder Required Braces or Orthoses: Sling Restrictions Weight Bearing Restrictions: Yes LUE Weight Bearing: Non weight bearing      Mobility  Bed Mobility Overal bed mobility: Modified Independent                  Transfers Overall transfer level: Modified independent Equipment used: None                    Ambulation/Gait Ambulation/Gait  assistance: Modified independent (Device/Increase time) Gait Distance (Feet): 200 Feet Assistive device: None Gait Pattern/deviations: WFL(Within Functional Limits)   Gait velocity interpretation: >2.62 ft/sec, indicative of community ambulatory      Stairs Stairs: Yes Stairs assistance: Modified independent (Device/Increase time) Stair Management: One rail Right, Alternating pattern, Forwards Number of Stairs: 5    Wheelchair Mobility    Modified Rankin (Stroke Patients Only)       Balance Overall balance assessment: Mild deficits observed, not formally tested                                           Pertinent Vitals/Pain Pain Assessment Pain Assessment: Faces Faces Pain Scale: Hurts a little bit Pain Location: L shoulder Pain Descriptors / Indicators: Grimacing Pain Intervention(s): Monitored during session    Home Living Family/patient expects to be discharged to:: Private residence Living Arrangements: Spouse/significant other Available Help at Discharge: Family Type of Home: House Home Access: Stairs to enter Entrance Stairs-Rails: Psychiatric nurse of Steps: 4-5   Home Layout: One level        Prior Function Prior Level of Function : Independent/Modified Independent                     Hand Dominance        Extremity/Trunk Assessment   Upper Extremity Assessment Upper Extremity Assessment: Defer to OT evaluation    Lower Extremity Assessment Lower Extremity Assessment: Overall WFL for tasks  assessed (hx of L knee pain - awaiting L TKA)    Cervical / Trunk Assessment Cervical / Trunk Assessment: Kyphotic (hx of back surgeries)  Communication   Communication: No difficulties  Cognition Arousal/Alertness: Awake/alert Behavior During Therapy: WFL for tasks assessed/performed Overall Cognitive Status: Within Functional Limits for tasks assessed                                           General Comments      Exercises     Assessment/Plan    PT Assessment Patient does not need any further PT services  PT Problem List         PT Treatment Interventions      PT Goals (Current goals can be found in the Care Plan section)  Acute Rehab PT Goals Patient Stated Goal: to go home PT Goal Formulation: All assessment and education complete, DC therapy    Frequency       Co-evaluation               AM-PAC PT "6 Clicks" Mobility  Outcome Measure Help needed turning from your back to your side while in a flat bed without using bedrails?: None Help needed moving from lying on your back to sitting on the side of a flat bed without using bedrails?: None Help needed moving to and from a bed to a chair (including a wheelchair)?: None Help needed standing up from a chair using your arms (e.g., wheelchair or bedside chair)?: None Help needed to walk in hospital room?: None Help needed climbing 3-5 steps with a railing? : None 6 Click Score: 24    End of Session   Activity Tolerance: Patient tolerated treatment well Patient left: in bed;with call bell/phone within reach Nurse Communication: Mobility status PT Visit Diagnosis: Muscle weakness (generalized) (M62.81);Unsteadiness on feet (R26.81)    Time: 6962-9528 PT Time Calculation (min) (ACUTE ONLY): 10 min   Charges:   PT Evaluation $PT Eval Low Complexity: 1 Low          Merranda Bolls A. Gilford Rile PT, DPT Acute Rehabilitation Services Pager [phone] Office [phone]   Linna Hoff 09/02/2021, 10:48 AM

## 2021-09-02 NOTE — Progress Notes (Signed)
°  Subjective: Keith [name]. is a 81 y.o. male s/p left RSA.  They are POD 1.  Pt's pain is controlled.  Patient denies any complaint of chest pain, shortness of breath, lightheadedness, dizziness, headache.  He feels well and block is still in effect.  Shoulder pain is overall controlled.  He has been ambulatory without any dizziness.  He feels this is gone much better than his hip surgery.  He resumed his Eliquis this morning..    Objective: Vital signs in last 24 hours: Temp:  [97 F (36.1 C)-98.6 F (37 C)] 97.5 F (36.4 C) (02/22 0719) Pulse Rate:  [53-72] 53 (02/22 0719) Resp:  [13-20] 18 (02/22 0719) BP: (103-136)/(47-73) 126/68 (02/22 0719) SpO2:  [95 %-99 %] 98 % (02/22 0719)  Intake/Output from previous day: 02/21 0701 - 02/22 0700 In: 3262.3 [P.O.:420; I.V.:2342.3; IV Piggyback:500] Out: 550 [Urine:300; Blood:250] Intake/Output this shift: No intake/output data recorded.  Exam:  No gross blood or drainage overlying the dressing 2+ radial pulse Sensation intact distally in the left hand Able to extend the 5/5 motor strength of left EPL, wrist extension, grip strength intact EPL, wrist extension, finger abduction, grip strength, bicep flexion.  No significant tricep extension strength at this time.  Axillary nerve is intact with deltoid firing.  Sensation intact over the lateral deltoid.   Labs: No results for input(s): HGB in the last 72 hours. No results for input(s): WBC, RBC, HCT, PLT in the last 72 hours. No results for input(s): NA, K, CL, CO2, BUN, CREATININE, GLUCOSE, CALCIUM in the last 72 hours. No results for input(s): LABPT, INR in the last 72 hours.  Assessment/Plan: Pt is POD 1 s/p left RSA    -Plan to discharge to home this afternoon.  His wife works at [employer] long as a Software engineer and she will get off around 1 or 2 PM.  Postop restrictions discussed with patient.  He has a CPM machine at home and will use this in his recovery.  -No lifting with the  operative arm  -Follow-up with Dr. Marlou Sa in clinic in 2 weeks    Keith [name] 09/02/2021, 7:45 AM

## 2021-09-05 DIAGNOSIS — M19012 Primary osteoarthritis, left shoulder: Secondary | ICD-10-CM

## 2021-09-09 NOTE — Discharge Summary (Signed)
Physician Discharge Summary      Patient ID: Keith [name]. MRN: [id number] DOB/AGE: [birth year] 82 y.o.  Admit date: 09/01/2021 Discharge date: 09/02/2021  Admission Diagnoses:  Principal Problem:   S/P reverse total shoulder arthroplasty, left Active Problems:   Arthritis of left shoulder region   Discharge Diagnoses:  Same  Surgeries: Procedure(s): LEFT REVERSE SHOULDER ARTHROPLASTY on 09/01/2021   Consultants:   Discharged Condition: Stable  Hospital Course: Keith [name]. is an 81 y.o. male who was admitted 09/01/2021 with a chief complaint of left shoulder pain, and found to have a diagnosis of left shoulder osteoarthritis.  They were brought to the operating room on 09/01/2021 and underwent the above named procedures.  Pt awoke from anesthesia without complication and was transferred to the floor. On POD1, patient's pain was well controlled.  He had no significant complaints and he was able to ambulate without dizziness.  He was discharged home on POD 1..  Pt will f/u with Dr. Marlou Sa in clinic in ~2 weeks.   Antibiotics given:  Anti-infectives (From admission, onward)    Start     Dose/Rate Route Frequency Ordered Stop   09/01/21 1400  ceFAZolin (ANCEF) IVPB 2g/100 mL premix        2 g 200 mL/hr over 30 Minutes Intravenous Every 8 hours 09/01/21 1308 09/02/21 0551   09/01/21 0919  vancomycin (VANCOCIN) powder  Status:  Discontinued          As needed 09/01/21 0920 09/01/21 1213   09/01/21 0630  ceFAZolin (ANCEF) IVPB 2g/100 mL premix        2 g 200 mL/hr over 30 Minutes Intravenous On call to O.R. 09/01/21 [id number] 09/01/21 0913     .  Recent vital signs:  Vitals:   09/02/21 0719 09/02/21 1109  BP: 126/68 129/83  Pulse: (!) 53 66  Resp: 18 18  Temp: (!) 97.5 F (36.4 C) 98.8 F (37.1 C)  SpO2: 98% 98%    Recent laboratory studies:  Results for orders placed or performed during the hospital encounter of 08/28/21  Surgical PCR Screen   Specimen: Nasal  Mucosa; Nasal Swab  Result Value Ref Range   MRSA, PCR NEGATIVE NEGATIVE   Staphylococcus aureus NEGATIVE NEGATIVE  SARS CORONAVIRUS 2 (TAT 6-24 HRS) Nasopharyngeal Nasopharyngeal Swab   Specimen: Nasopharyngeal Swab  Result Value Ref Range   SARS Coronavirus 2 NEGATIVE NEGATIVE  Basic metabolic panel  Result Value Ref Range   Sodium 141 135 - 145 mmol/L   Potassium 3.9 3.5 - 5.1 mmol/L   Chloride 107 98 - 111 mmol/L   CO2 26 22 - 32 mmol/L   Glucose, Bld 87 70 - 99 mg/dL   BUN 29 (H) 8 - 23 mg/dL   Creatinine, Ser 1.05 0.61 - 1.24 mg/dL   Calcium 8.8 (L) 8.9 - 10.3 mg/dL   GFR, Estimated >60 >60 mL/min   Anion gap 8 5 - 15  CBC  Result Value Ref Range   WBC 7.5 4.0 - 10.5 K/uL   RBC 4.67 4.22 - 5.81 MIL/uL   Hemoglobin 15.0 13.0 - 17.0 g/dL   HCT 43.8 39.0 - 52.0 %   MCV 93.8 80.0 - 100.0 fL   MCH 32.1 26.0 - 34.0 pg   MCHC 34.2 30.0 - 36.0 g/dL   RDW 13.8 11.5 - 15.5 %   Platelets 187 150 - 400 K/uL   nRBC 0.0 0.0 - 0.2 %  Urinalysis, Routine w reflex microscopic Urine, Clean  Catch  Result Value Ref Range   Color, Urine YELLOW YELLOW   APPearance CLEAR CLEAR   Specific Gravity, Urine 1.019 1.005 - 1.030   pH 5.0 5.0 - 8.0   Glucose, UA NEGATIVE NEGATIVE mg/dL   Hgb urine dipstick NEGATIVE NEGATIVE   Bilirubin Urine NEGATIVE NEGATIVE   Ketones, ur NEGATIVE NEGATIVE mg/dL   Protein, ur NEGATIVE NEGATIVE mg/dL   Nitrite NEGATIVE NEGATIVE   Leukocytes,Ua NEGATIVE NEGATIVE    Discharge Medications:   Allergies as of 09/02/2021       Reactions   Tape Rash   Tears skin         Medication List     STOP taking these medications    acetaminophen 650 MG CR tablet Commonly known as: TYLENOL   cyclobenzaprine 5 MG tablet Commonly known as: FLEXERIL       TAKE these medications    cetirizine 10 MG tablet Commonly known as: ZYRTEC Take 10 mg by mouth daily.   diphenhydrAMINE 25 MG tablet Commonly known as: BENADRYL Take 25 mg by mouth at bedtime.    Eliquis 5 MG Tabs tablet Generic drug: apixaban Take 1 tablet (5 mg total) by mouth 2 (two) times daily.   famotidine 10 MG tablet Commonly known as: PEPCID Take 10 mg by mouth daily as needed for heartburn or indigestion.   levothyroxine 50 MCG tablet Commonly known as: SYNTHROID TAKE 1 TABLET BY MOUTH ONCE A DAY BEFORE BREAKFAST   lisinopril-hydrochlorothiazide 10-12.5 MG tablet Commonly known as: ZESTORETIC Take 1 tablet by mouth daily   methocarbamol 500 MG tablet Commonly known as: ROBAXIN Take 1 tablet (500 mg total) by mouth every 8 (eight) hours as needed for muscle spasms.   oxyCODONE-acetaminophen 5-325 MG tablet Commonly known as: PERCOCET/ROXICET Take 1 tablet by mouth every 4 (four) hours as needed for severe pain. What changed:  how much to take when to take this   polyethylene glycol 17 g packet Commonly known as: MIRALAX / GLYCOLAX Take 17 g by mouth daily as needed for moderate constipation.   tamsulosin 0.4 MG Caps capsule Commonly known as: FLOMAX Take 1 capsule by mouth once daily What changed:  how much to take when to take this        Diagnostic Studies: DG Shoulder Left Port  Result Date: 09/01/2021 CLINICAL DATA:  Post left total shoulder replacement. EXAM: LEFT SHOULDER COMPARISON:  Left shoulder CT-07/08/2021 FINDINGS: Post left total shoulder replacement. Alignment appears anatomic, though note, a scapular Y radiograph was not provided. No evidence of hardware failure or loosening. There is a minimal amount of expected subcutaneous emphysema about the operative site. No radiopaque foreign body. Limited visualization of the adjacent thorax demonstrates sequela of previous median sternotomy. Atherosclerotic plaque within the aortic arch. IMPRESSION: Post left total shoulder replacement without evidence of complication. Electronically Signed   By: Sandi Mariscal M.D.   On: 09/01/2021 13:18    Disposition: Discharge disposition: 01-Home or Self  Care       Discharge Instructions     Call MD / Call 911   Complete by: As directed    If you experience chest pain or shortness of breath, CALL 911 and be transported to the hospital emergency room.  If you develope a fever above 101 F, pus (white drainage) or increased drainage or redness at the wound, or calf pain, call your surgeon's office.   Constipation Prevention   Complete by: As directed    Drink plenty of fluids.  Prune juice may be helpful.  You may use a stool softener, such as Colace (over the counter) 100 mg twice a day.  Use MiraLax (over the counter) for constipation as needed.   Diet - low sodium heart healthy   Complete by: As directed    Discharge instructions   Complete by: As directed    You may shower, dressing is waterproof.  Do not bathe or soak the operative shoulder in a tub, pool.  Use the CPM machine 3 times a day for one hour each time at least. Increase the range of motion with each session as we discussed.  No lifting with the operative shoulder but you can use it for activities like feeding yourself and bathing. Continue use of the sling but you may remove it for sleeping, CPM machine, showering.  Follow-up with Dr. Marlou Sa in ~2 weeks on your given appointment date.  We will remove your adhesive bandage at that time.  Call the office at [phone] with any questions or concerns.  Dental Antibiotics:  In most cases prophylactic antibiotics for Dental procdeures after total joint surgery are not necessary.  Exceptions are as follows:  1. History of prior total joint infection  2. Severely immunocompromised (Organ Transplant, cancer chemotherapy, Rheumatoid biologic meds such as [place])  3. Poorly controlled diabetes (A1C &gt; 8.0, blood glucose over 200)  If you have one of these conditions, contact your surgeon for an antibiotic prescription, prior to your dental procedure.   Increase activity slowly as tolerated   Complete by: As directed     Post-operative opioid taper instructions:   Complete by: As directed    POST-OPERATIVE OPIOID TAPER INSTRUCTIONS: It is important to wean off of your opioid medication as soon as possible. If you do not need pain medication after your surgery it is ok to stop day one. Opioids include: Codeine, Hydrocodone(Norco, Vicodin), Oxycodone(Percocet, oxycontin) and hydromorphone amongst others.  Long term and even short term use of opiods can cause: Increased pain response Dependence Constipation Depression Respiratory depression And more.  Withdrawal symptoms can include Flu like symptoms Nausea, vomiting And more Techniques to manage these symptoms Hydrate well Eat regular healthy meals Stay active Use relaxation techniques(deep breathing, meditating, yoga) Do Not substitute Alcohol to help with tapering If you have been on opioids for less than two weeks and do not have pain than it is ok to stop all together.  Plan to wean off of opioids This plan should start within one week post op of your joint replacement. Maintain the same interval or time between taking each dose and first decrease the dose.  Cut the total daily intake of opioids by one tablet each day Next start to increase the time between doses. The last dose that should be eliminated is the evening dose.             SignedDonella Stade 09/09/2021, 9:02 PM

## 2021-09-10 ENCOUNTER — Other Ambulatory Visit (HOSPITAL_COMMUNITY): Payer: Self-pay

## 2021-09-16 ENCOUNTER — Other Ambulatory Visit: Payer: Self-pay

## 2021-09-16 ENCOUNTER — Other Ambulatory Visit (HOSPITAL_COMMUNITY): Payer: Self-pay

## 2021-09-16 ENCOUNTER — Ambulatory Visit (INDEPENDENT_AMBULATORY_CARE_PROVIDER_SITE_OTHER): Payer: 59

## 2021-09-16 ENCOUNTER — Ambulatory Visit (INDEPENDENT_AMBULATORY_CARE_PROVIDER_SITE_OTHER): Payer: 59 | Admitting: Surgical

## 2021-09-16 DIAGNOSIS — Z96612 Presence of left artificial shoulder joint: Secondary | ICD-10-CM

## 2021-09-16 MED ORDER — OXYCODONE-ACETAMINOPHEN 5-325 MG PO TABS
1.0000 | ORAL_TABLET | Freq: Four times a day (QID) | ORAL | 0 refills | Status: DC | PRN
  Filled 2021-09-16: qty 30, 8d supply, fill #0

## 2021-09-16 MED ORDER — METHOCARBAMOL 500 MG PO TABS
500.0000 mg | ORAL_TABLET | Freq: Three times a day (TID) | ORAL | 0 refills | Status: DC | PRN
  Filled 2021-09-16: qty 30, 10d supply, fill #0

## 2021-09-18 ENCOUNTER — Other Ambulatory Visit (HOSPITAL_COMMUNITY): Payer: Self-pay

## 2021-09-18 ENCOUNTER — Encounter: Payer: Self-pay | Admitting: Orthopedic Surgery

## 2021-09-18 NOTE — Progress Notes (Signed)
? ?Post-Op Visit Note ?  ?Patient: Keith [name].           ?Date of Birth: [birth year]           ?MRN: [id number] ?Visit Date: 09/16/2021 ?PCP: Celene Squibb, MD ? ? ?Assessment & Plan: ? ?Chief Complaint:  ?Chief Complaint  ?Patient presents with  ? Left Shoulder - Routine Post Op  ?  09/01/21 left RSA  ? ?Visit Diagnoses:  ?1. History of arthroplasty of left shoulder   ? ? ?Plan: Patient is a 81 year old male who presents s/p left reverse shoulder arthroplasty on 09/01/2021.  He is doing well overall with no complaints aside from shoulder discomfort.  He is in the sling.  Using CPM machine up to 90 degrees.  Only takes Percocet and muscle relaxer 1-2 times per day at most.  Denies any fevers, chills, feelings of shoulder instability.  Able to use his arm for ADLs.  He sleeps well at night without difficulty. ? ?On exam, he is neurovascularly intact with 2+ radial pulse of the operative extremity.  Range of motion 15 degrees external rotation, 75 degrees abduction, 90 degrees forward flexion.  Axillary nerve is intact with deltoid firing.  Intact EPL, wrist extension, finger abduction, finger adduction, FPL, pronation/supination, bicep, tricep, deltoid.  Incision looks to be healing well without evidence of infection or dehiscence. ? ?Plan is referral to physical therapy in [place].  Okay for passive and active range of motion of the left shoulder at this point.  No external rotation passively past 30 degrees.  No strengthening exercises until about 6 weeks out from surgery to allow the subscapularis repair to heal.  Left shoulder radiographs were reviewed with the patient today and do demonstrate left reverse shoulder prosthesis in excellent position and alignment without any complicating features.  Follow-up in 4 weeks for clinical recheck with Dr. Marlou Sa. ? ?Follow-Up Instructions: No follow-ups on file.  ? ?Orders:  ?Orders Placed This Encounter  ?Procedures  ? XR Shoulder Left  ? Ambulatory referral to  Occupational Therapy  ? ?Meds ordered this encounter  ?Medications  ? oxyCODONE-acetaminophen (PERCOCET/ROXICET) 5-325 MG tablet  ?  Sig: Take 1 tablet by mouth every 6 (six) hours as needed for severe pain.  ?  Dispense:  30 tablet  ?  Refill:  0  ? methocarbamol (ROBAXIN) 500 MG tablet  ?  Sig: Take 1 tablet by mouth every 8 (eight) hours as needed for muscle spasms.  ?  Dispense:  30 tablet  ?  Refill:  0  ? ? ?Imaging: ?No results found. ? ?PMFS History: ?Patient Active Problem List  ? Diagnosis Date Noted  ? Arthritis of left shoulder region   ? S/P reverse total shoulder arthroplasty, left 09/01/2021  ? Status post left hip replacement 12/19/2020  ? Primary osteoarthritis of left hip 12/04/2020  ? Hyperlipidemia 04/30/2020  ? Cataract 10/10/2019  ? Right ureteral stone 10/01/2019  ? Left ureteral stone 10/01/2019  ? S/P lumbar fusion 06/15/2019  ? Acquired hypothyroidism 08/10/2017  ? Benign prostatic hyperplasia with nocturia 08/10/2017  ? S/P lumbar laminectomy 06/01/2017  ? PAF (paroxysmal atrial fibrillation) ([place]) 09/07/2016  ? S/P AVR 01/18/2014  ? Hypertension 02/22/2013  ? ?Past Medical History:  ?Diagnosis Date  ? Arthritis   ? Atrial fibrillation ([place])   ? Cardioversion 2016  ? Atrial flutter ([place])   ? Cardioversion 2013  ? Blood transfusion without reported diagnosis   ? Cancer [facility])   ? Skin- Basil/Squamous  cell  ? Dysrhythmia   ? GERD (gastroesophageal reflux disease)   ? History of aortic valve replacement with bioprosthetic valve   ? Aortic regurgitation  ? History of chicken pox   ? History of kidney stones   ? History of skin cancer   ? History of stroke 2011  ? Hypothyroidism   ? Kidney stone   ? Seasonal allergies   ? Stroke [facility])   ? after heart surgery 2010  ?  ?Family History  ?Problem Relation Age of Onset  ? Colon cancer Neg Hx   ? Esophageal cancer Neg Hx   ? Rectal cancer Neg Hx   ? Stomach cancer Neg Hx   ?  ?Past Surgical History:  ?Procedure Laterality Date  ? AORTIC VALVE  REPLACEMENT  2009  ? Bioprosthetic AVR and root replacement  ? BACK SURGERY    ? total of 4 back surgeries  ? CATARACT EXTRACTION, BILATERAL    ? COLONOSCOPY    ? CYSTOSCOPY WITH RETROGRADE PYELOGRAM, URETEROSCOPY AND STENT PLACEMENT Bilateral 10/01/2019  ? Procedure: CYSTOSCOPY WITH RETROGRADE PYELOGRAM, URETEROSCOPY AND STENT PLACEMENT;  Surgeon: Irine Seal, MD;  Location: [facility];  Service: Urology;  Laterality: Bilateral;  ? EPIGASTRIC HERNIA REPAIR    ? HERNIA REPAIR    ? LUMBAR LAMINECTOMY/DECOMPRESSION MICRODISCECTOMY Right 06/01/2017  ? Procedure: EXTRAFORAMINAL MICRODISCECTOMY LUMBAR FIVE- SACRAL ONE, RIGHT;  Surgeon: Eustace Moore, MD;  Location: [place];  Service: Neurosurgery;  Laterality: Right;  ? LUMBAR LAMINECTOMY/DECOMPRESSION MICRODISCECTOMY Bilateral 09/04/2018  ? Procedure: Laminectomy and Foraminotomy - Lumbar two-three bilateral;  Surgeon: Eustace Moore, MD;  Location: [place];  Service: Neurosurgery;  Laterality: Bilateral;  ? REVERSE SHOULDER ARTHROPLASTY Left 09/01/2021  ? Procedure: LEFT REVERSE SHOULDER ARTHROPLASTY;  Surgeon: Meredith Pel, MD;  Location: [place];  Service: Orthopedics;  Laterality: Left;  ? TOTAL HIP ARTHROPLASTY Left 12/19/2020  ? Procedure: LEFT TOTAL HIP ARTHROPLASTY ANTERIOR APPROACH;  Surgeon: Mcarthur Rossetti, MD;  Location: [facility];  Service: Orthopedics;  Laterality: Left;  ? TRANSFORAMINAL LUMBAR INTERBODY FUSION (TLIF) WITH PEDICLE SCREW FIXATION 1 LEVEL Left 06/15/2019  ? Procedure: Decompression and Instrumention Fusion Lumbar Four- Five;  Surgeon: Eustace Moore, MD;  Location: [place];  Service: Neurosurgery;  Laterality: Left;  Decompression and Instrumention Fusion Lumbar Four- Five  ? ?Social History  ? ?Occupational History  ? Occupation: Chief Strategy Officer  ?Tobacco Use  ? Smoking status: Never  ? Smokeless tobacco: Never  ?Vaping Use  ? Vaping Use: Never used  ?Substance and Sexual Activity  ? Alcohol use: Yes  ?  Comment: occasional  ? Drug use: No  ?  Sexual activity: Yes  ? ? ? ?

## 2021-10-08 ENCOUNTER — Encounter (HOSPITAL_COMMUNITY): Payer: Self-pay

## 2021-10-08 ENCOUNTER — Ambulatory Visit (HOSPITAL_COMMUNITY): Payer: 59 | Attending: Surgical

## 2021-10-08 DIAGNOSIS — R29898 Other symptoms and signs involving the musculoskeletal system: Secondary | ICD-10-CM | POA: Diagnosis not present

## 2021-10-08 DIAGNOSIS — M25512 Pain in left shoulder: Secondary | ICD-10-CM | POA: Diagnosis not present

## 2021-10-08 DIAGNOSIS — M25612 Stiffness of left shoulder, not elsewhere classified: Secondary | ICD-10-CM | POA: Diagnosis not present

## 2021-10-08 NOTE — Patient Instructions (Signed)
Complete the following exercises 2 times a day. ? ?Doorway Stretch ? ?Place each hand opposite each other on the doorway. (You can change where you feel the stretch by moving arms higher or lower.) ?Step through with one foot and bend front knee until a stretch is felt and hold. ?Step through with the opposite foot on the next rep. ?Hold for __20-30___ seconds. Repeat __2__times.  ? ? ? ? ? ? ?Wall Flexion ? ?Slide your arm up the wall or door frame until a stretch is felt in your shoulder . Hold for 20-30 seconds. Complete 2 times  ? ? ? ?Shoulder Abduction Stretch ? ?Stand side ways by a wall with affected up on wall. Gently step in toward wall to feel stretch. Hold for 20-30 seconds. Complete 2 times. ? ?  ?

## 2021-10-08 NOTE — Therapy (Signed)
?OUTPATIENT OCCUPATIONAL THERAPY ORTHO EVALUATION ? ?Patient Name: Keith [name]. ?MRN: [id number] ?DOB:10-Dec-1940, 81 y.o., male ?Today's Date: 10/08/2021 ? ?PCP: Celene Squibb, MD ?REFERRING PROVIDER: Meredith Pel, MD ? ? OT End of Session - 10/08/21 1113   ? ? Visit Number 1   ? Number of Visits 6   ? Date for OT Re-Evaluation 11/19/21   ? Authorization Type 1) [employer] 2) Medicare   ? Authorization Time Period 1) covered 100%. no visit limit. medical necessity after 25th visit.   ? Progress Note Due on Visit 10   ? OT Start Time 1025   ? OT Stop Time 1055   ? OT Time Calculation (min) 30 min   ? Activity Tolerance Patient tolerated treatment well   ? Behavior During Therapy [facility] for tasks assessed/performed   ? ?  ?  ? ?  ? ? ?Past Medical History:  ?Diagnosis Date  ? Arthritis   ? Atrial fibrillation ([place])   ? Cardioversion 2016  ? Atrial flutter ([place])   ? Cardioversion 2013  ? Blood transfusion without reported diagnosis   ? Cancer [facility])   ? Skin- Basil/Squamous cell  ? Dysrhythmia   ? GERD (gastroesophageal reflux disease)   ? History of aortic valve replacement with bioprosthetic valve   ? Aortic regurgitation  ? History of chicken pox   ? History of kidney stones   ? History of skin cancer   ? History of stroke 2011  ? Hypothyroidism   ? Kidney stone   ? Seasonal allergies   ? Stroke [facility])   ? after heart surgery 2010  ? ?Past Surgical History:  ?Procedure Laterality Date  ? AORTIC VALVE REPLACEMENT  2009  ? Bioprosthetic AVR and root replacement  ? BACK SURGERY    ? total of 4 back surgeries  ? CATARACT EXTRACTION, BILATERAL    ? COLONOSCOPY    ? CYSTOSCOPY WITH RETROGRADE PYELOGRAM, URETEROSCOPY AND STENT PLACEMENT Bilateral 10/01/2019  ? Procedure: CYSTOSCOPY WITH RETROGRADE PYELOGRAM, URETEROSCOPY AND STENT PLACEMENT;  Surgeon: Irine Seal, MD;  [facility];  Service: Urology;  Laterality: Bilateral;  ? EPIGASTRIC HERNIA REPAIR    ? HERNIA REPAIR    ? LUMBAR LAMINECTOMY/DECOMPRESSION  MICRODISCECTOMY Right 06/01/2017  ? Procedure: EXTRAFORAMINAL MICRODISCECTOMY LUMBAR FIVE- SACRAL ONE, RIGHT;  Surgeon: Eustace Moore, MD;  Location: [place];  Service: Neurosurgery;  Laterality: Right;  ? LUMBAR LAMINECTOMY/DECOMPRESSION MICRODISCECTOMY Bilateral 09/04/2018  ? Procedure: Laminectomy and Foraminotomy - Lumbar two-three bilateral;  Surgeon: Eustace Moore, MD;  Location: [place];  Service: Neurosurgery;  Laterality: Bilateral;  ? REVERSE SHOULDER ARTHROPLASTY Left 09/01/2021  ? Procedure: LEFT REVERSE SHOULDER ARTHROPLASTY;  Surgeon: Meredith Pel, MD;  Location: [place];  Service: Orthopedics;  Laterality: Left;  ? TOTAL HIP ARTHROPLASTY Left 12/19/2020  ? Procedure: LEFT TOTAL HIP ARTHROPLASTY ANTERIOR APPROACH;  Surgeon: Mcarthur Rossetti, MD;  [facility];  Service: Orthopedics;  Laterality: Left;  ? TRANSFORAMINAL LUMBAR INTERBODY FUSION (TLIF) WITH PEDICLE SCREW FIXATION 1 LEVEL Left 06/15/2019  ? Procedure: Decompression and Instrumention Fusion Lumbar Four- Five;  Surgeon: Eustace Moore, MD;  Location: [place];  Service: Neurosurgery;  Laterality: Left;  Decompression and Instrumention Fusion Lumbar Four- Five  ? ?Patient Active Problem List  ? Diagnosis Date Noted  ? Arthritis of left shoulder region   ? S/P reverse total shoulder arthroplasty, left 09/01/2021  ? Status post left hip replacement 12/19/2020  ? Primary osteoarthritis of left  hip 12/04/2020  ? Hyperlipidemia 04/30/2020  ? Cataract 10/10/2019  ? Right ureteral stone 10/01/2019  ? Left ureteral stone 10/01/2019  ? S/P lumbar fusion 06/15/2019  ? Acquired hypothyroidism 08/10/2017  ? Benign prostatic hyperplasia with nocturia 08/10/2017  ? S/P lumbar laminectomy 06/01/2017  ? PAF (paroxysmal atrial fibrillation) ([place]) 09/07/2016  ? S/P AVR 01/18/2014  ? Hypertension 02/22/2013  ? ? ?ONSET DATE: 09/01/21 ? ?REFERRING DIAG: left reverse shoulder arthroplasty ? ?THERAPY DIAG:  ?Other symptoms and signs involving the  musculoskeletal system ? ?Stiffness of left shoulder, not elsewhere classified ? ?Acute pain of left shoulder ? ?SUBJECTIVE:  ? ?SUBJECTIVE STATEMENT: ?S: I'm doing everything I need to do. I like to be active. ?Pt accompanied by: self ? ?PERTINENT HISTORY: Patient is a 81 y/o male S/P left reverse shoulder arthroplasty completed on 09/01/21 by Dr. Marlou Sa.  ? ?PRECAUTIONS: Shoulder Per order: P/ROM and A/ROM. No er past 30 degrees ? ?WEIGHT BEARING RESTRICTIONS No ? ?PAIN:  ?Are you having pain? No ? ?FALLS: Has patient fallen in last 6 months? No ? 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 Likes to be active. He doesn't like to sit around. ? ?PLOF: Independent ? ?PATIENT GOALS None stated. Wants to do what is recommended. ? ?OBJECTIVE:  ? ?HAND DOMINANCE: Right ? ?ADLs: ?Overall ADLs: Pt reports no difficulty completing any ADL tasks.  ? ? ?FUNCTIONAL OUTCOME MEASURES: ?FOTO: 66/100 ? ?UE ROM    ? ?Active ROM ?Assessed standing. IR/er adducted Left ?10/08/2021  ?Shoulder flexion 115  ?Shoulder abduction 112  ?Shoulder internal rotation 42  ?Shoulder external rotation 80  ?(Blank rows = not tested) ? ? ? ?UE MMT:    ? ?MMT ?Assessed standing. IR/er adducted Left ?10/08/2021  ?Shoulder flexion 5/5  ?Shoulder abduction 5/5  ?Shoulder internal rotation 5/5  ?Shoulder external rotation 4+/5  ?(Blank rows = not tested) ? ? ? ?COGNITION: ?Overall cognitive status: Within functional limits for tasks assessed ? ? ? ? ? ?PATIENT EDUCATION: ?Education details: shoulder stretches ?Person educated: Patient ?Education method: Explanation, Demonstration, and Handouts ?Education comprehension: verbalized understanding and returned demonstration ? ? ?HOME EXERCISE PROGRAM: ?Eval: shoulder stretches ? ?GOALS: ?Goals reviewed with patient? Yes ? ?SHORT TERM GOALS: Target date: 10/29/2021 ? ?Pt will  increase his LUE shoulder stability and endurance in order to complete prolonged overhead reaching tasks with less difficulty and fatigue. ? ?Goal status: INITIAL ? ?2.  Pt will increase his LUE A/ROM by 20 degrees or more in order to complete high level reaching tasks with less difficulty.  ? ?Goal status: INITIAL ? ?3.  Pt will decrease his Left shoulder fascial restrictions to min amount or less in order to increase the functional mobility needed to complete all reaching tasks.  ? ?Goal status: INITIAL ? ?ASSESSMENT: ? ?CLINICAL IMPRESSION: ?Patient is a 81 y.o. male who was seen today for occupational therapy evaluation for left reverse shoulder arthroplasty.  ? ?PERFORMANCE DEFICITS in functional skills including ROM, strength, fascial restrictions, and flexibility ? ?IMPAIRMENTS are limiting patient from ADLs and leisure.  ? ?COMORBIDITIES may have co-morbidities  that affects occupational performance. Patient will benefit from skilled OT to address above impairments and improve overall function. ? ?MODIFICATION OR ASSISTANCE TO COMPLETE EVALUATION: No modification of tasks or assist necessary to complete an evaluation. ? ?OT OCCUPATIONAL PROFILE AND HISTORY: Problem focused assessment: Including  review of records relating to presenting problem. ? ?CLINICAL DECISION MAKING: LOW - limited treatment options, no task modification necessary ? ?REHAB POTENTIAL: Excellent ? ?EVALUATION COMPLEXITY: Low ? ? ? ? ?PLAN: ?OT FREQUENCY: 2x/week ? ?OT DURATION: 3 weeks ? ?PLANNED INTERVENTIONS: self care/ADL training, therapeutic exercise, therapeutic activity, neuromuscular re-education, manual therapy, electrical stimulation, ultrasound, cryotherapy, and patient/family education ? ? ? ?CONSULTED AND AGREED WITH PLAN OF CARE: Patient ? ?PLAN FOR NEXT SESSION: Myofascial release, passive stretching, A/ROM, Shoulder stretches, functional reaching. ? ? ?Ailene Ravel, OTR/L,CBIS  ?[phone] ? ?10/08/2021, 11:15 AM ?  ?

## 2021-10-14 ENCOUNTER — Encounter (HOSPITAL_COMMUNITY): Payer: Self-pay

## 2021-10-14 ENCOUNTER — Ambulatory Visit (INDEPENDENT_AMBULATORY_CARE_PROVIDER_SITE_OTHER): Payer: 59 | Admitting: Orthopedic Surgery

## 2021-10-14 ENCOUNTER — Ambulatory Visit (HOSPITAL_COMMUNITY): Payer: 59 | Attending: Surgical

## 2021-10-14 DIAGNOSIS — R29898 Other symptoms and signs involving the musculoskeletal system: Secondary | ICD-10-CM | POA: Insufficient documentation

## 2021-10-14 DIAGNOSIS — M19012 Primary osteoarthritis, left shoulder: Secondary | ICD-10-CM

## 2021-10-14 DIAGNOSIS — M25612 Stiffness of left shoulder, not elsewhere classified: Secondary | ICD-10-CM | POA: Insufficient documentation

## 2021-10-14 DIAGNOSIS — M25512 Pain in left shoulder: Secondary | ICD-10-CM | POA: Insufficient documentation

## 2021-10-14 DIAGNOSIS — M5412 Radiculopathy, cervical region: Secondary | ICD-10-CM | POA: Diagnosis not present

## 2021-10-14 DIAGNOSIS — M1611 Unilateral primary osteoarthritis, right hip: Secondary | ICD-10-CM

## 2021-10-14 DIAGNOSIS — M1712 Unilateral primary osteoarthritis, left knee: Secondary | ICD-10-CM

## 2021-10-14 NOTE — Therapy (Signed)
?OUTPATIENT OCCUPATIONAL THERAPY TREATMENT NOTE ? ? ?Patient Name: Keith [name]. ?MRN: [id number] ?DOB:Mar 11, 1941, 81 y.o., male ?Today's Date: 10/14/2021 ? ?PCP: Celene Squibb, MD ?REFERRING PROVIDER: Meredith Pel, MD ? ?END OF SESSION:  ? OT End of Session - 10/14/21 0946   ? ? Visit Number 2   ? Number of Visits 6   ? Date for OT Re-Evaluation 11/19/21   ? Authorization Type 1) [employer] 2) Medicare   ? Authorization Time Period 1) covered 100%. no visit limit. medical necessity after 25th visit.   ? Progress Note Due on Visit 10   ? OT Start Time 0945   ? OT Stop Time 1023   ? OT Time Calculation (min) 38 min   ? Activity Tolerance Patient tolerated treatment well   ? Behavior During Therapy [facility] for tasks assessed/performed   ? ?  ?  ? ?  ? ? ?Past Medical History:  ?Diagnosis Date  ? Arthritis   ? Atrial fibrillation ([place])   ? Cardioversion 2016  ? Atrial flutter ([place])   ? Cardioversion 2013  ? Blood transfusion without reported diagnosis   ? Cancer [facility])   ? Skin- Basil/Squamous cell  ? Dysrhythmia   ? GERD (gastroesophageal reflux disease)   ? History of aortic valve replacement with bioprosthetic valve   ? Aortic regurgitation  ? History of chicken pox   ? History of kidney stones   ? History of skin cancer   ? History of stroke 2011  ? Hypothyroidism   ? Kidney stone   ? Seasonal allergies   ? Stroke [facility])   ? after heart surgery 2010  ? ?Past Surgical History:  ?Procedure Laterality Date  ? AORTIC VALVE REPLACEMENT  2009  ? Bioprosthetic AVR and root replacement  ? BACK SURGERY    ? total of 4 back surgeries  ? CATARACT EXTRACTION, BILATERAL    ? COLONOSCOPY    ? CYSTOSCOPY WITH RETROGRADE PYELOGRAM, URETEROSCOPY AND STENT PLACEMENT Bilateral 10/01/2019  ? Procedure: CYSTOSCOPY WITH RETROGRADE PYELOGRAM, URETEROSCOPY AND STENT PLACEMENT;  Surgeon: Irine Seal, MD;  [facility];  Service: Urology;  Laterality: Bilateral;  ? EPIGASTRIC HERNIA REPAIR    ? HERNIA REPAIR    ? LUMBAR  LAMINECTOMY/DECOMPRESSION MICRODISCECTOMY Right 06/01/2017  ? Procedure: EXTRAFORAMINAL MICRODISCECTOMY LUMBAR FIVE- SACRAL ONE, RIGHT;  Surgeon: Eustace Moore, MD;  Location: [place];  Service: Neurosurgery;  Laterality: Right;  ? LUMBAR LAMINECTOMY/DECOMPRESSION MICRODISCECTOMY Bilateral 09/04/2018  ? Procedure: Laminectomy and Foraminotomy - Lumbar two-three bilateral;  Surgeon: Eustace Moore, MD;  Location: [place];  Service: Neurosurgery;  Laterality: Bilateral;  ? REVERSE SHOULDER ARTHROPLASTY Left 09/01/2021  ? Procedure: LEFT REVERSE SHOULDER ARTHROPLASTY;  Surgeon: Meredith Pel, MD;  Location: [place];  Service: Orthopedics;  Laterality: Left;  ? TOTAL HIP ARTHROPLASTY Left 12/19/2020  ? Procedure: LEFT TOTAL HIP ARTHROPLASTY ANTERIOR APPROACH;  Surgeon: Mcarthur Rossetti, MD;  [facility];  Service: Orthopedics;  Laterality: Left;  ? TRANSFORAMINAL LUMBAR INTERBODY FUSION (TLIF) WITH PEDICLE SCREW FIXATION 1 LEVEL Left 06/15/2019  ? Procedure: Decompression and Instrumention Fusion Lumbar Four- Five;  Surgeon: Eustace Moore, MD;  Location: [place];  Service: Neurosurgery;  Laterality: Left;  Decompression and Instrumention Fusion Lumbar Four- Five  ? ?Patient Active Problem List  ? Diagnosis Date Noted  ? Arthritis of left shoulder region   ? S/P reverse total shoulder arthroplasty, left 09/01/2021  ? Status post left hip replacement 12/19/2020  ?  Primary osteoarthritis of left hip 12/04/2020  ? Hyperlipidemia 04/30/2020  ? Cataract 10/10/2019  ? Right ureteral stone 10/01/2019  ? Left ureteral stone 10/01/2019  ? S/P lumbar fusion 06/15/2019  ? Acquired hypothyroidism 08/10/2017  ? Benign prostatic hyperplasia with nocturia 08/10/2017  ? S/P lumbar laminectomy 06/01/2017  ? PAF (paroxysmal atrial fibrillation) ([place]) 09/07/2016  ? S/P AVR 01/18/2014  ? Hypertension 02/22/2013  ? ? ?ONSET DATE: 09/01/21 ? ?REFERRING DIAG: left reverse shoulder arthroplasty ? ?THERAPY DIAG:  ?Other symptoms and signs  involving the musculoskeletal system ? ?Acute pain of left shoulder ? ?Stiffness of left shoulder, not elsewhere classified ? ? ?PERTINENT HISTORY: Patient is a 81 y/o male S/P left reverse shoulder arthroplasty completed on 09/01/21 by Dr. Marlou Sa.  ? ?PRECAUTIONS: Shoulder Per order: P/ROM and A/ROM. No er past 30 degrees ? ?SUBJECTIVE: S: I see the Doctor this afternoon.  ? ?PAIN:  ?Are you having pain? No ? ? ? ? ?OBJECTIVE:  ?UE ROM  ?  ?Active ROM ?Assessed standing. IR/er adducted Left ?10/08/2021  ?Shoulder flexion 115  ?Shoulder abduction 112  ?Shoulder internal rotation 42  ?Shoulder external rotation 80  ?(Blank rows = not tested) ?  ?  ?  ?UE MMT:    ?  ?MMT ?Assessed standing. IR/er adducted Left ?10/08/2021  ?Shoulder flexion 5/5  ?Shoulder abduction 5/5  ?Shoulder internal rotation 5/5  ?Shoulder external rotation 4+/5  ?(Blank rows = not tested) ? ?PATIENT EDUCATION: ?Education details: scar massage. Use Vitamin E oil or non-fragrance lotion/cream. Once a day. ?Person educated: patient ?Education method: verbalized ?Education comprehension: verbalized understanding ?  ?  ?HOME EXERCISE PROGRAM: ?Eval: shoulder stretches ? ? ?GOALS: ?Goals reviewed with patient? Yes ?  ?SHORT TERM GOALS: Target date: 10/29/2021 ?  ?Pt will increase his LUE shoulder stability and endurance in order to complete prolonged overhead reaching tasks with less difficulty and fatigue. ?  ?Goal status: On-going ?  ?2.  Pt will increase his LUE A/ROM by 20 degrees or more in order to complete high level reaching tasks with less difficulty.  ?  ?Goal status: On-going ?  ?3.  Pt will decrease his Left shoulder fascial restrictions to min amount or less in order to increase the functional mobility needed to complete all reaching tasks.  ?  ?Goal status: On-going ?  ? ?TODAY'S TREATMENT: ?10/14/21 ?- Manual therapy: Myofascial release and passive stretching completed to left shoulder to decrease fascial restrictions.  ?- P/ROM: shoulder, all  ranges, 5X ?- A/ROM: shoulder, Supine: flexion 10X ?                                Standing: flexion, horizontal abduction/adducted, IR/er, abduction, 10X ?- Functional reaching: Squigz placed on door at heighest level prior to removing with left arm. ?- UBE bike: level 2 2' forward 2' reverse, pace: 7.5-8.0 ?- Shoulder stretches: flexion, abduction, doorway stretch, 2x20" ? ? ? ? ? ? ? 10/14/21 0946  ?OT Assessment and Plan  ?Clinical Impression Statement A:Initiated myofascial release to left shoulder to address fascial restrictions. VC were provided with visual demonstration for form and technique. Focused on A/ROM and functional reaching while also incorporating shoulder endurance.  ?Pt verbalized that he's not sure if he needs to continue with therapy although he would like to wait until his appointment with Dr. Marlou Sa this afternoon.   ?Pt will benefit from skilled therapeutic intervention in order to improve on the  following performance deficits Body Structure / Function / Physical Skills  ?Body Structure / Function / Physical Skills ADL;Fascial restriction;ROM;Strength;Flexibility  ?Plan P: Continue to work on increasing ROM. Follow upon MD appointment. Add scapular strengthening with red or green band.  ?Consulted and Agree with Plan of Care Patient  ? ? ?Ailene Ravel, OTR/L,CBIS  ?[phone] ? ?10/14/2021, 10:27 AM ? ?  ? ?  ?

## 2021-10-16 ENCOUNTER — Encounter: Payer: Self-pay | Admitting: Orthopedic Surgery

## 2021-10-16 ENCOUNTER — Ambulatory Visit (HOSPITAL_COMMUNITY): Payer: 59

## 2021-10-16 ENCOUNTER — Encounter (HOSPITAL_COMMUNITY): Payer: Self-pay

## 2021-10-16 DIAGNOSIS — M25512 Pain in left shoulder: Secondary | ICD-10-CM | POA: Diagnosis not present

## 2021-10-16 DIAGNOSIS — R29898 Other symptoms and signs involving the musculoskeletal system: Secondary | ICD-10-CM | POA: Diagnosis not present

## 2021-10-16 DIAGNOSIS — M25612 Stiffness of left shoulder, not elsewhere classified: Secondary | ICD-10-CM | POA: Diagnosis not present

## 2021-10-16 DIAGNOSIS — M5412 Radiculopathy, cervical region: Secondary | ICD-10-CM | POA: Diagnosis not present

## 2021-10-16 NOTE — Progress Notes (Signed)
? ?Post-Op Visit Note ?  ?Patient: Keith [name].           ?Date of Birth: Jun 05, 1941           ?MRN: [id number] ?Visit Date: 10/14/2021 ?PCP: Celene Squibb, MD ? ? ?Assessment & Plan: ? ?Chief Complaint:  ?Chief Complaint  ?Patient presents with  ? Left Shoulder - Routine Post Op  ?   left reverse shoulder arthroplasty on 09/01/2021  ? Left Knee - Pain  ? ?Visit Diagnoses:  ?1. Primary osteoarthritis of right hip   ?2. Primary osteoarthritis of left knee   ?3. Arthritis of left shoulder region   ? ? ?Plan: Keith [name] is a 81 year old patient who underwent left reverse shoulder replacement 09/01/2021.  He is doing very well with no problems.  Finished with the CPM.  Finished with therapy.  He has no concerns with his left shoulder and feels like it was a relatively seamless recovery. ? ?Patient is concerned about his left knee.  He would like for me to replace his knee based on how he did with his shoulder.  Has had prior aspiration and injection with Dil.  Has some lower leg numbness consistent with diagnosis of neuropathy.  Had a stroke on the left side which she has recovered reasonably well from.  Is likely to go back on Eliquis in the near future pending consultation with the cardiologist he sees who is his friend in [place].  I discussed with him how Dr. Ninfa Linden is an excellent knee surgeon and does lots of total knees but Lido nonetheless would prefer have me replace his knee when the time comes. ? ?On examination he does have very good forward flexion and AB duction of that left shoulder with intact incision and good deltoid strength.  Left knee demonstrates mild varus alignment with palpable pulses and intact extensor mechanism.  Plan at this time is for him to come back in 6 weeks for final check and motion measurements on the left arm.  We can discuss then more about knee replacement.  I think he needs to have at least 3 months from his shoulder replacement for the body to recover metabolically in order to  heal the knee replacement. ? ?Follow-Up Instructions: No follow-ups on file.  ? ?Orders:  ?No orders of the defined types were placed in this encounter. ? ?No orders of the defined types were placed in this encounter. ? ? ?Imaging: ?No results found. ? ?PMFS History: ?Patient Active Problem List  ? Diagnosis Date Noted  ? Arthritis of left shoulder region   ? S/P reverse total shoulder arthroplasty, left 09/01/2021  ? Status post left hip replacement 12/19/2020  ? Primary osteoarthritis of left hip 12/04/2020  ? Hyperlipidemia 04/30/2020  ? Cataract 10/10/2019  ? Right ureteral stone 10/01/2019  ? Left ureteral stone 10/01/2019  ? S/P lumbar fusion 06/15/2019  ? Acquired hypothyroidism 08/10/2017  ? Benign prostatic hyperplasia with nocturia 08/10/2017  ? S/P lumbar laminectomy 06/01/2017  ? PAF (paroxysmal atrial fibrillation) ([place]) 09/07/2016  ? S/P AVR 01/18/2014  ? Hypertension 02/22/2013  ? ?Past Medical History:  ?Diagnosis Date  ? Arthritis   ? Atrial fibrillation ([place])   ? Cardioversion 2016  ? Atrial flutter ([place])   ? Cardioversion 2013  ? Blood transfusion without reported diagnosis   ? Cancer [facility])   ? Skin- Basil/Squamous cell  ? Dysrhythmia   ? GERD (gastroesophageal reflux disease)   ? History of aortic valve replacement with bioprosthetic  valve   ? Aortic regurgitation  ? History of chicken pox   ? History of kidney stones   ? History of skin cancer   ? History of stroke 2011  ? Hypothyroidism   ? Kidney stone   ? Seasonal allergies   ? Stroke [facility])   ? after heart surgery 2010  ?  ?Family History  ?Problem Relation Age of Onset  ? Colon cancer Neg Hx   ? Esophageal cancer Neg Hx   ? Rectal cancer Neg Hx   ? Stomach cancer Neg Hx   ?  ?Past Surgical History:  ?Procedure Laterality Date  ? AORTIC VALVE REPLACEMENT  2009  ? Bioprosthetic AVR and root replacement  ? BACK SURGERY    ? total of 4 back surgeries  ? CATARACT EXTRACTION, BILATERAL    ? COLONOSCOPY    ? CYSTOSCOPY WITH RETROGRADE PYELOGRAM,  URETEROSCOPY AND STENT PLACEMENT Bilateral 10/01/2019  ? Procedure: CYSTOSCOPY WITH RETROGRADE PYELOGRAM, URETEROSCOPY AND STENT PLACEMENT;  Surgeon: Irine Seal, MD;  [facility];  Service: Urology;  Laterality: Bilateral;  ? EPIGASTRIC HERNIA REPAIR    ? HERNIA REPAIR    ? LUMBAR LAMINECTOMY/DECOMPRESSION MICRODISCECTOMY Right 06/01/2017  ? Procedure: EXTRAFORAMINAL MICRODISCECTOMY LUMBAR FIVE- SACRAL ONE, RIGHT;  Surgeon: Eustace Moore, MD;  Location: [place];  Service: Neurosurgery;  Laterality: Right;  ? LUMBAR LAMINECTOMY/DECOMPRESSION MICRODISCECTOMY Bilateral 09/04/2018  ? Procedure: Laminectomy and Foraminotomy - Lumbar two-three bilateral;  Surgeon: Eustace Moore, MD;  Location: [place];  Service: Neurosurgery;  Laterality: Bilateral;  ? REVERSE SHOULDER ARTHROPLASTY Left 09/01/2021  ? Procedure: LEFT REVERSE SHOULDER ARTHROPLASTY;  Surgeon: Meredith Pel, MD;  Location: [place];  Service: Orthopedics;  Laterality: Left;  ? TOTAL HIP ARTHROPLASTY Left 12/19/2020  ? Procedure: LEFT TOTAL HIP ARTHROPLASTY ANTERIOR APPROACH;  Surgeon: Mcarthur Rossetti, MD;  [facility];  Service: Orthopedics;  Laterality: Left;  ? TRANSFORAMINAL LUMBAR INTERBODY FUSION (TLIF) WITH PEDICLE SCREW FIXATION 1 LEVEL Left 06/15/2019  ? Procedure: Decompression and Instrumention Fusion Lumbar Four- Five;  Surgeon: Eustace Moore, MD;  Location: [place];  Service: Neurosurgery;  Laterality: Left;  Decompression and Instrumention Fusion Lumbar Four- Five  ? ?Social History  ? ?Occupational History  ? Occupation: Chief Strategy Officer  ?Tobacco Use  ? Smoking status: Never  ? Smokeless tobacco: Never  ?Vaping Use  ? Vaping Use: Never used  ?Substance and Sexual Activity  ? Alcohol use: Yes  ?  Comment: occasional  ? Drug use: No  ? Sexual activity: Yes  ? ? ? ?

## 2021-10-16 NOTE — Therapy (Signed)
?OUTPATIENT OCCUPATIONAL THERAPY TREATMENT NOTE ? ? ?Patient Name: Keith [name]. ?MRN: [id number] ?DOB:19-Aug-1940, 81 y.o., male ?Today's Date: 10/16/2021 ? ?PCP: Celene Squibb, MD ?REFERRING PROVIDER: Meredith Pel, MD ? ?END OF SESSION:  ? OT End of Session - 10/16/21 1154   ? ? Visit Number 3   ? Number of Visits 6   ? Date for OT Re-Evaluation 11/19/21   ? Authorization Type 1) [employer] 2) Medicare   ? Authorization Time Period 1) covered 100%. no visit limit. medical necessity after 25th visit.   ? Progress Note Due on Visit 10   ? OT Start Time 1115   ? OT Stop Time 1153   ? OT Time Calculation (min) 38 min   ? Activity Tolerance Patient tolerated treatment well   ? Behavior During Therapy [facility] for tasks assessed/performed   ? ?  ?  ? ?  ? ? ? ?Past Medical History:  ?Diagnosis Date  ? Arthritis   ? Atrial fibrillation ([place])   ? Cardioversion 2016  ? Atrial flutter ([place])   ? Cardioversion 2013  ? Blood transfusion without reported diagnosis   ? Cancer [facility])   ? Skin- Basil/Squamous cell  ? Dysrhythmia   ? GERD (gastroesophageal reflux disease)   ? History of aortic valve replacement with bioprosthetic valve   ? Aortic regurgitation  ? History of chicken pox   ? History of kidney stones   ? History of skin cancer   ? History of stroke 2011  ? Hypothyroidism   ? Kidney stone   ? Seasonal allergies   ? Stroke [facility])   ? after heart surgery 2010  ? ?Past Surgical History:  ?Procedure Laterality Date  ? AORTIC VALVE REPLACEMENT  2009  ? Bioprosthetic AVR and root replacement  ? BACK SURGERY    ? total of 4 back surgeries  ? CATARACT EXTRACTION, BILATERAL    ? COLONOSCOPY    ? CYSTOSCOPY WITH RETROGRADE PYELOGRAM, URETEROSCOPY AND STENT PLACEMENT Bilateral 10/01/2019  ? Procedure: CYSTOSCOPY WITH RETROGRADE PYELOGRAM, URETEROSCOPY AND STENT PLACEMENT;  Surgeon: Irine Seal, MD;  [facility];  Service: Urology;  Laterality: Bilateral;  ? EPIGASTRIC HERNIA REPAIR    ? HERNIA REPAIR    ? LUMBAR  LAMINECTOMY/DECOMPRESSION MICRODISCECTOMY Right 06/01/2017  ? Procedure: EXTRAFORAMINAL MICRODISCECTOMY LUMBAR FIVE- SACRAL ONE, RIGHT;  Surgeon: Eustace Moore, MD;  Location: [place];  Service: Neurosurgery;  Laterality: Right;  ? LUMBAR LAMINECTOMY/DECOMPRESSION MICRODISCECTOMY Bilateral 09/04/2018  ? Procedure: Laminectomy and Foraminotomy - Lumbar two-three bilateral;  Surgeon: Eustace Moore, MD;  Location: [place];  Service: Neurosurgery;  Laterality: Bilateral;  ? REVERSE SHOULDER ARTHROPLASTY Left 09/01/2021  ? Procedure: LEFT REVERSE SHOULDER ARTHROPLASTY;  Surgeon: Meredith Pel, MD;  Location: [place];  Service: Orthopedics;  Laterality: Left;  ? TOTAL HIP ARTHROPLASTY Left 12/19/2020  ? Procedure: LEFT TOTAL HIP ARTHROPLASTY ANTERIOR APPROACH;  Surgeon: Mcarthur Rossetti, MD;  [facility];  Service: Orthopedics;  Laterality: Left;  ? TRANSFORAMINAL LUMBAR INTERBODY FUSION (TLIF) WITH PEDICLE SCREW FIXATION 1 LEVEL Left 06/15/2019  ? Procedure: Decompression and Instrumention Fusion Lumbar Four- Five;  Surgeon: Eustace Moore, MD;  Location: [place];  Service: Neurosurgery;  Laterality: Left;  Decompression and Instrumention Fusion Lumbar Four- Five  ? ?Patient Active Problem List  ? Diagnosis Date Noted  ? Arthritis of left shoulder region   ? S/P reverse total shoulder arthroplasty, left 09/01/2021  ? Status post left hip replacement 12/19/2020  ?  Primary osteoarthritis of left hip 12/04/2020  ? Hyperlipidemia 04/30/2020  ? Cataract 10/10/2019  ? Right ureteral stone 10/01/2019  ? Left ureteral stone 10/01/2019  ? S/P lumbar fusion 06/15/2019  ? Acquired hypothyroidism 08/10/2017  ? Benign prostatic hyperplasia with nocturia 08/10/2017  ? S/P lumbar laminectomy 06/01/2017  ? PAF (paroxysmal atrial fibrillation) ([place]) 09/07/2016  ? S/P AVR 01/18/2014  ? Hypertension 02/22/2013  ? ? ?ONSET DATE: 09/01/21 ? ?REFERRING DIAG: left reverse shoulder arthroplasty ? ?THERAPY DIAG:  ?Other symptoms and signs  involving the musculoskeletal system ? ?Acute pain of left shoulder ? ?Stiffness of left shoulder, not elsewhere classified ? ? ?PERTINENT HISTORY: Patient is a 81 y/o male S/P left reverse shoulder arthroplasty completed on 09/01/21 by Dr. Marlou Sa.  ? ?PRECAUTIONS: Shoulder Per order: P/ROM and A/ROM. No er past 30 degrees ? ?SUBJECTIVE: S: The Doctor said it was up to me if I wanted to continue. He released me.  ? ?PAIN:  ?Are you having pain? No ? ? ? ? ?OBJECTIVE:  ?UE ROM  ?  ?Active ROM ?Assessed standing. IR/er adducted Left ?10/08/2021  ?Shoulder flexion 115  ?Shoulder abduction 112  ?Shoulder internal rotation 42  ?Shoulder external rotation 80  ?(Blank rows = not tested) ?  ?  ?  ?UE MMT:    ?  ?MMT ?Assessed standing. IR/er adducted Left ?10/08/2021  ?Shoulder flexion 5/5  ?Shoulder abduction 5/5  ?Shoulder internal rotation 5/5  ?Shoulder external rotation 4+/5  ?(Blank rows = not tested) ? ?PATIENT EDUCATION: ?Education details:  ?Person educated:  ?IT sales professional:  ?Education comprehension:  ?  ?  ?HOME EXERCISE PROGRAM: ?Eval: shoulder stretches ? ? ?GOALS: ?Goals reviewed with patient? Yes ?  ?SHORT TERM GOALS: Target date: 10/29/2021 ?  ?Pt will increase his LUE shoulder stability and endurance in order to complete prolonged overhead reaching tasks with less difficulty and fatigue. ?  ?Goal status: On-going ?  ?2.  Pt will increase his LUE A/ROM by 20 degrees or more in order to complete high level reaching tasks with less difficulty.  ?  ?Goal status: On-going ?  ?3.  Pt will decrease his Left shoulder fascial restrictions to min amount or less in order to increase the functional mobility needed to complete all reaching tasks.  ?  ?Goal status: On-going ?  ? ?TODAY'S TREATMENT: ?10/16/21 ?- Manual therapy: Myofascial release and passive stretching completed to left shoulder to decrease fascial restrictions.  ?- P/ROM: shoulder, all ranges, 5X ?- green therapy ball strengthening, standing. Chest press,  flexion, circles (left/right) 10X ?- UBE bike: level 3, 2' forward, 2' reverse pace: 11.5 ?- Overhead lacing: seated, laced chain from top to bottom prior to removal.  ?- Scapular strengthening: green band, row, extension, retraction, 10X ? ? ? ?10/14/21 ?- Manual therapy: Myofascial release and passive stretching completed to left shoulder to decrease fascial restrictions.  ?- P/ROM: shoulder, all ranges, 5X ?- A/ROM: shoulder, Supine: flexion 10X ?                                Standing: flexion, horizontal abduction/adducted, IR/er, abduction, 10X ?- Functional reaching: Squigz placed on door at heighest level prior to removing with left arm. ?- UBE bike: level 2 2' forward 2' reverse, pace: 7.5-8.0 ?- Shoulder stretches: flexion, abduction, doorway stretch, 2x20" ? ? ? ? ? ? ? 10/14/21 0946  ?OT Assessment and Plan  ?Clinical Impression Statement A:Myofascial release to  left shoulder to address fascial restrictions. VC were provided with visual demonstration for form and technique. Focused on increasing shoulder ROM, scapular and shoulder strength/stability/endurance..  ?  ?Pt will benefit from skilled therapeutic intervention in order to improve on the following performance deficits Body Structure / Function / Physical Skills  ?Body Structure / Function / Physical Skills ADL;Fascial restriction;ROM;Strength;Flexibility  ?Plan P: Continue to work on increasing ROM.  Scapular strengthening. Take updated ROM measurements to assess progress.   ?Consulted and Agree with Plan of Care Patient  ? ? ?Ailene Ravel, OTR/L,CBIS  ?[phone] ? ?10/16/2021, 11:56 AM ? ?  ? ?  ?

## 2021-10-21 ENCOUNTER — Ambulatory Visit (HOSPITAL_COMMUNITY): Payer: 59

## 2021-10-21 ENCOUNTER — Encounter (HOSPITAL_COMMUNITY): Payer: Self-pay

## 2021-10-21 DIAGNOSIS — R29898 Other symptoms and signs involving the musculoskeletal system: Secondary | ICD-10-CM

## 2021-10-21 DIAGNOSIS — M25512 Pain in left shoulder: Secondary | ICD-10-CM | POA: Diagnosis not present

## 2021-10-21 DIAGNOSIS — M25612 Stiffness of left shoulder, not elsewhere classified: Secondary | ICD-10-CM | POA: Diagnosis not present

## 2021-10-21 DIAGNOSIS — M5412 Radiculopathy, cervical region: Secondary | ICD-10-CM | POA: Diagnosis not present

## 2021-10-21 NOTE — Therapy (Signed)
?OUTPATIENT OCCUPATIONAL THERAPY TREATMENT NOTE ? ? ?Patient Name: Keith [name]. ?MRN: [id number] ?DOB:1940-12-17, 81 y.o., male ?Today's Date: 10/21/2021 ? ?PCP: Celene Squibb, MD ?REFERRING PROVIDER: Meredith Pel, MD ? ?END OF SESSION:  ? OT End of Session - 10/21/21 1038   ? ? Visit Number 4   ? Number of Visits 6   ? Date for OT Re-Evaluation 11/19/21   ? Authorization Type 1) [employer] 2) Medicare   ? Authorization Time Period 1) covered 100%. no visit limit. medical necessity after 25th visit.   ? Progress Note Due on Visit 10   ? OT Start Time 1030   ? OT Stop Time 1108   ? OT Time Calculation (min) 38 min   ? Activity Tolerance Patient tolerated treatment well   ? Behavior During Therapy [facility] for tasks assessed/performed   ? ?  ?  ? ?  ? ? ? ? ?Past Medical History:  ?Diagnosis Date  ? Arthritis   ? Atrial fibrillation ([place])   ? Cardioversion 2016  ? Atrial flutter ([place])   ? Cardioversion 2013  ? Blood transfusion without reported diagnosis   ? Cancer [facility])   ? Skin- Basil/Squamous cell  ? Dysrhythmia   ? GERD (gastroesophageal reflux disease)   ? History of aortic valve replacement with bioprosthetic valve   ? Aortic regurgitation  ? History of chicken pox   ? History of kidney stones   ? History of skin cancer   ? History of stroke 2011  ? Hypothyroidism   ? Kidney stone   ? Seasonal allergies   ? Stroke [facility])   ? after heart surgery 2010  ? ?Past Surgical History:  ?Procedure Laterality Date  ? AORTIC VALVE REPLACEMENT  2009  ? Bioprosthetic AVR and root replacement  ? BACK SURGERY    ? total of 4 back surgeries  ? CATARACT EXTRACTION, BILATERAL    ? COLONOSCOPY    ? CYSTOSCOPY WITH RETROGRADE PYELOGRAM, URETEROSCOPY AND STENT PLACEMENT Bilateral 10/01/2019  ? Procedure: CYSTOSCOPY WITH RETROGRADE PYELOGRAM, URETEROSCOPY AND STENT PLACEMENT;  Surgeon: Irine Seal, MD;  [facility];  Service: Urology;  Laterality: Bilateral;  ? EPIGASTRIC HERNIA REPAIR    ? HERNIA REPAIR    ? LUMBAR  LAMINECTOMY/DECOMPRESSION MICRODISCECTOMY Right 06/01/2017  ? Procedure: EXTRAFORAMINAL MICRODISCECTOMY LUMBAR FIVE- SACRAL ONE, RIGHT;  Surgeon: Eustace Moore, MD;  Location: [place];  Service: Neurosurgery;  Laterality: Right;  ? LUMBAR LAMINECTOMY/DECOMPRESSION MICRODISCECTOMY Bilateral 09/04/2018  ? Procedure: Laminectomy and Foraminotomy - Lumbar two-three bilateral;  Surgeon: Eustace Moore, MD;  Location: [place];  Service: Neurosurgery;  Laterality: Bilateral;  ? REVERSE SHOULDER ARTHROPLASTY Left 09/01/2021  ? Procedure: LEFT REVERSE SHOULDER ARTHROPLASTY;  Surgeon: Meredith Pel, MD;  Location: [place];  Service: Orthopedics;  Laterality: Left;  ? TOTAL HIP ARTHROPLASTY Left 12/19/2020  ? Procedure: LEFT TOTAL HIP ARTHROPLASTY ANTERIOR APPROACH;  Surgeon: Mcarthur Rossetti, MD;  [facility];  Service: Orthopedics;  Laterality: Left;  ? TRANSFORAMINAL LUMBAR INTERBODY FUSION (TLIF) WITH PEDICLE SCREW FIXATION 1 LEVEL Left 06/15/2019  ? Procedure: Decompression and Instrumention Fusion Lumbar Four- Five;  Surgeon: Eustace Moore, MD;  Location: [place];  Service: Neurosurgery;  Laterality: Left;  Decompression and Instrumention Fusion Lumbar Four- Five  ? ?Patient Active Problem List  ? Diagnosis Date Noted  ? Arthritis of left shoulder region   ? S/P reverse total shoulder arthroplasty, left 09/01/2021  ? Status post left hip replacement  12/19/2020  ? Primary osteoarthritis of left hip 12/04/2020  ? Hyperlipidemia 04/30/2020  ? Cataract 10/10/2019  ? Right ureteral stone 10/01/2019  ? Left ureteral stone 10/01/2019  ? S/P lumbar fusion 06/15/2019  ? Acquired hypothyroidism 08/10/2017  ? Benign prostatic hyperplasia with nocturia 08/10/2017  ? S/P lumbar laminectomy 06/01/2017  ? PAF (paroxysmal atrial fibrillation) ([place]) 09/07/2016  ? S/P AVR 01/18/2014  ? Hypertension 02/22/2013  ? ? ?ONSET DATE: 09/01/21 ? ?REFERRING DIAG: left reverse shoulder arthroplasty ? ?THERAPY DIAG:  ?Other symptoms and signs  involving the musculoskeletal system ? ?Acute pain of left shoulder ? ?Stiffness of left shoulder, not elsewhere classified ? ? ?PERTINENT HISTORY: Patient is a 81 y/o male S/P left reverse shoulder arthroplasty completed on 09/01/21 by Dr. Marlou Sa.  ? ?PRECAUTIONS: Shoulder Per order: P/ROM and A/ROM. No er past 30 degrees ? ?SUBJECTIVE: S: The Doctor said it was up to me if I wanted to continue. He released me.  ? ?PAIN:  ?Are you having pain? No ? ? ? ? ?OBJECTIVE:  ?UE ROM  ?  ?Active ROM ?Assessed standing. IR/er adducted Left ?10/08/2021 Left ?10/21/21  ?Shoulder flexion 115 130  ?Shoulder abduction 112 130  ?Shoulder internal rotation 42 67  ?Shoulder external rotation 80 60  ?(Blank rows = not tested) ?  ?  ?  ?UE MMT:    ?  ?MMT ?Assessed standing. IR/er adducted Left ?10/08/2021  ?Shoulder flexion 5/5  ?Shoulder abduction 5/5  ?Shoulder internal rotation 5/5  ?Shoulder external rotation 4+/5  ?(Blank rows = not tested) ? ?PATIENT EDUCATION: ?Education details:  ?Person educated:  ?IT sales professional:  ?Education comprehension:  ?  ?  ?HOME EXERCISE PROGRAM: ?Eval: shoulder stretches ? ? ?GOALS: ?Goals reviewed with patient? Yes ?  ?SHORT TERM GOALS: Target date: 10/29/2021 ?  ?Pt will increase his LUE shoulder stability and endurance in order to complete prolonged overhead reaching tasks with less difficulty and fatigue. ?  ?Goal status: On-going ?  ?2.  Pt will increase his LUE A/ROM by 20 degrees or more in order to complete high level reaching tasks with less difficulty.  ?  ?Goal status: On-going ?  ?3.  Pt will decrease his Left shoulder fascial restrictions to min amount or less in order to increase the functional mobility needed to complete all reaching tasks.  ?  ?Goal status: On-going ?  ? ?TODAY'S TREATMENT: ?10/21/21 ?- UBE bike: Level 4, 2' reverse 2' forward, pace: 11.5 ?- Shoulder stretches: doorway, flexion, abduction, 2x30" ?- Scapular strengthening: green band, row, extension, retraction, 12X ?-  Overhead lacing: seated, laced chain from top to bottom prior to removal; 1# wrist weight ? ? ? ? ?10/16/21 ?- Manual therapy: Myofascial release and passive stretching completed to left shoulder to decrease fascial restrictions.  ?- P/ROM: shoulder, all ranges, 5X ?- green therapy ball strengthening, standing. Chest press, flexion, circles (left/right) 10X ?- UBE bike: level 3, 2' forward, 2' reverse pace: 11.5 ?- Overhead lacing: seated, laced chain from top to bottom prior to removal.  ?- Scapular strengthening: green band, row, extension, retraction, 12X ?- ABC's proximal shoulder/scapular stability with 1# wrist weight ?- Proximal shoulder strengthening: 1# wrist weight, used washcloth, 1' flexion ? ? ? ? ?10/14/21 ?- Manual therapy: Myofascial release and passive stretching completed to left shoulder to decrease fascial restrictions.  ?- P/ROM: shoulder, all ranges, 5X ?- A/ROM: shoulder, Supine: flexion 10X ?  Standing: flexion, horizontal abduction/adducted, IR/er, abduction, 10X ?- Functional reaching: Squigz placed on door at heighest level prior to removing with left arm. ?- UBE bike: level 2 2' forward 2' reverse, pace: 7.5-8.0 ?- Shoulder stretches: flexion, abduction, doorway stretch, 2x20" ? ? ? ? ? ? ? 10/14/21 0946  ?OT Assessment and Plan  ?Clinical Impression Statement A: Started session with warm up on UBE bike prior to shoulder stretches. Updated A/ROM measurements with improvement noted with all shoulder ranges although er did decrease from evaluation. VC were provided with visual demonstration for form and technique. Focused on increasing shoulder ROM, scapular and shoulder strength/stability/endurance.  ?  ?Pt will benefit from skilled therapeutic intervention in order to improve on the following performance deficits Body Structure / Function / Physical Skills  ?Body Structure / Function / Physical Skills ADL;Fascial restriction;ROM;Strength;Flexibility  ?Plan P:  Continue to work on increasing ROM. Scapular strengthening. Complete ball on the wall  ?Consulted and Agree with Plan of Care Patient  ? ? ?Ailene Ravel, OTR/L,CBIS  ?[phone] ? ?10/21/2021, 11:20 AM ? ?

## 2021-10-23 ENCOUNTER — Other Ambulatory Visit (HOSPITAL_COMMUNITY): Payer: Self-pay

## 2021-10-23 ENCOUNTER — Encounter (HOSPITAL_COMMUNITY): Payer: Self-pay

## 2021-10-23 ENCOUNTER — Ambulatory Visit (HOSPITAL_COMMUNITY): Payer: 59

## 2021-10-23 DIAGNOSIS — M25612 Stiffness of left shoulder, not elsewhere classified: Secondary | ICD-10-CM | POA: Diagnosis not present

## 2021-10-23 DIAGNOSIS — M25512 Pain in left shoulder: Secondary | ICD-10-CM | POA: Diagnosis not present

## 2021-10-23 DIAGNOSIS — M5412 Radiculopathy, cervical region: Secondary | ICD-10-CM | POA: Diagnosis not present

## 2021-10-23 DIAGNOSIS — R29898 Other symptoms and signs involving the musculoskeletal system: Secondary | ICD-10-CM

## 2021-10-23 NOTE — Therapy (Signed)
?OUTPATIENT OCCUPATIONAL THERAPY TREATMENT NOTE ? ? ?Patient Name: Keith [name]. ?MRN: [id number] ?DOB:1941/05/07, 81 y.o., male ?Today's Date: 10/23/2021 ? ?PCP: Celene Squibb, MD ?REFERRING PROVIDER: Meredith Pel, MD ? ?END OF SESSION:  ? OT End of Session - 10/23/21 1037   ? ? Visit Number 5   ? Number of Visits 6   ? Date for OT Re-Evaluation 11/19/21   ? Authorization Type 1) [employer] 2) Medicare   ? Authorization Time Period 1) covered 100%. no visit limit. medical necessity after 25th visit.   ? Progress Note Due on Visit 10   ? OT Start Time 1030   ? OT Stop Time 1108   ? OT Time Calculation (min) 38 min   ? Activity Tolerance Patient tolerated treatment well   ? Behavior During Therapy [facility] for tasks assessed/performed   ? ?  ?  ? ?  ? ? ? ? ? ?Past Medical History:  ?Diagnosis Date  ? Arthritis   ? Atrial fibrillation ([place])   ? Cardioversion 2016  ? Atrial flutter ([place])   ? Cardioversion 2013  ? Blood transfusion without reported diagnosis   ? Cancer [facility])   ? Skin- Basil/Squamous cell  ? Dysrhythmia   ? GERD (gastroesophageal reflux disease)   ? History of aortic valve replacement with bioprosthetic valve   ? Aortic regurgitation  ? History of chicken pox   ? History of kidney stones   ? History of skin cancer   ? History of stroke 2011  ? Hypothyroidism   ? Kidney stone   ? Seasonal allergies   ? Stroke [facility])   ? after heart surgery 2010  ? ?Past Surgical History:  ?Procedure Laterality Date  ? AORTIC VALVE REPLACEMENT  2009  ? Bioprosthetic AVR and root replacement  ? BACK SURGERY    ? total of 4 back surgeries  ? CATARACT EXTRACTION, BILATERAL    ? COLONOSCOPY    ? CYSTOSCOPY WITH RETROGRADE PYELOGRAM, URETEROSCOPY AND STENT PLACEMENT Bilateral 10/01/2019  ? Procedure: CYSTOSCOPY WITH RETROGRADE PYELOGRAM, URETEROSCOPY AND STENT PLACEMENT;  Surgeon: Irine Seal, MD;  [facility];  Service: Urology;  Laterality: Bilateral;  ? EPIGASTRIC HERNIA REPAIR    ? HERNIA REPAIR    ? LUMBAR  LAMINECTOMY/DECOMPRESSION MICRODISCECTOMY Right 06/01/2017  ? Procedure: EXTRAFORAMINAL MICRODISCECTOMY LUMBAR FIVE- SACRAL ONE, RIGHT;  Surgeon: Eustace Moore, MD;  Location: [place];  Service: Neurosurgery;  Laterality: Right;  ? LUMBAR LAMINECTOMY/DECOMPRESSION MICRODISCECTOMY Bilateral 09/04/2018  ? Procedure: Laminectomy and Foraminotomy - Lumbar two-three bilateral;  Surgeon: Eustace Moore, MD;  Location: [place];  Service: Neurosurgery;  Laterality: Bilateral;  ? REVERSE SHOULDER ARTHROPLASTY Left 09/01/2021  ? Procedure: LEFT REVERSE SHOULDER ARTHROPLASTY;  Surgeon: Meredith Pel, MD;  Location: [place];  Service: Orthopedics;  Laterality: Left;  ? TOTAL HIP ARTHROPLASTY Left 12/19/2020  ? Procedure: LEFT TOTAL HIP ARTHROPLASTY ANTERIOR APPROACH;  Surgeon: Mcarthur Rossetti, MD;  [facility];  Service: Orthopedics;  Laterality: Left;  ? TRANSFORAMINAL LUMBAR INTERBODY FUSION (TLIF) WITH PEDICLE SCREW FIXATION 1 LEVEL Left 06/15/2019  ? Procedure: Decompression and Instrumention Fusion Lumbar Four- Five;  Surgeon: Eustace Moore, MD;  Location: [place];  Service: Neurosurgery;  Laterality: Left;  Decompression and Instrumention Fusion Lumbar Four- Five  ? ?Patient Active Problem List  ? Diagnosis Date Noted  ? Arthritis of left shoulder region   ? S/P reverse total shoulder arthroplasty, left 09/01/2021  ? Status post left hip  replacement 12/19/2020  ? Primary osteoarthritis of left hip 12/04/2020  ? Hyperlipidemia 04/30/2020  ? Cataract 10/10/2019  ? Right ureteral stone 10/01/2019  ? Left ureteral stone 10/01/2019  ? S/P lumbar fusion 06/15/2019  ? Acquired hypothyroidism 08/10/2017  ? Benign prostatic hyperplasia with nocturia 08/10/2017  ? S/P lumbar laminectomy 06/01/2017  ? PAF (paroxysmal atrial fibrillation) ([place]) 09/07/2016  ? S/P AVR 01/18/2014  ? Hypertension 02/22/2013  ? ? ?ONSET DATE: 09/01/21 ? ?REFERRING DIAG: left reverse shoulder arthroplasty ? ?THERAPY DIAG:  ?Acute pain of left  shoulder ? ?Stiffness of left shoulder, not elsewhere classified ? ?Other symptoms and signs involving the musculoskeletal system ? ? ?PERTINENT HISTORY: Patient is a 81 y/o male S/P left reverse shoulder arthroplasty completed on 09/01/21 by Dr. Marlou Sa.  ? ?PRECAUTIONS: Progress as tolerated.  ? ?SUBJECTIVE: S: Arm is doing good. ? ?PAIN:  ?Are you having pain? No ? ? ? ? ?OBJECTIVE:  ?UE ROM  ?  ?Active ROM ?Assessed standing. IR/er adducted Left ?10/08/2021 Left ?10/21/21  ?Shoulder flexion 115 130  ?Shoulder abduction 112 130  ?Shoulder internal rotation 42 67  ?Shoulder external rotation 80 60  ?(Blank rows = not tested) ?  ?  ?  ?UE MMT:    ?  ?MMT ?Assessed standing. IR/er adducted Left ?10/08/2021  ?Shoulder flexion 5/5  ?Shoulder abduction 5/5  ?Shoulder internal rotation 5/5  ?Shoulder external rotation 4+/5  ?(Blank rows = not tested) ? ?PATIENT EDUCATION: ?Education details:  ?Person educated:  ?IT sales professional:  ?Education comprehension:  ?  ?  ?HOME EXERCISE PROGRAM: ?Eval: shoulder stretches ? ? ?GOALS: ?Goals reviewed with patient? Yes ?  ?SHORT TERM GOALS: Target date: 10/29/2021 ?  ?Pt will increase his LUE shoulder stability and endurance in order to complete prolonged overhead reaching tasks with less difficulty and fatigue. ?  ?Goal status: On-going ?  ?2.  Pt will increase his LUE A/ROM by 20 degrees or more in order to complete high level reaching tasks with less difficulty.  ?  ?Goal status: On-going ?  ?3.  Pt will decrease his Left shoulder fascial restrictions to min amount or less in order to increase the functional mobility needed to complete all reaching tasks.  ?  ?Goal status: On-going ?  ? ?TODAY'S TREATMENT: ?10/23/21 ?- UBE bike: Level 6, 2' forward 2' reverse pace: 11.5 ?- Overhead lacing: seated, laced chain from top to bottom prior to removal; 1# wrist weight ?- Bodycraft: row (palm down handles) standing, 2X10, 20# ?- Ball on the wall: 1' flexion 1' abduction red ball ?- Red band  loop: wall slides, lateral wall taps, wall clock slides (11,9,7) 10X each ? ? ? ? ?10/21/21 ?- UBE bike: Level 4, 2' reverse 2' forward, pace: 11.5 ?- Shoulder stretches: doorway, flexion, abduction, 2x30" ?- Scapular strengthening: green band, row, extension, retraction, 12X ?- Overhead lacing: seated, laced chain from top to bottom prior to removal; 1# wrist weight ? ? ? ? ?10/16/21 ?- Manual therapy: Myofascial release and passive stretching completed to left shoulder to decrease fascial restrictions.  ?- P/ROM: shoulder, all ranges, 5X ?- green therapy ball strengthening, standing. Chest press, flexion, circles (left/right) 10X ?- UBE bike: level 3, 2' forward, 2' reverse pace: 11.5 ?- Overhead lacing: seated, laced chain from top to bottom prior to removal.  ?- Scapular strengthening: green band, row, extension, retraction, 12X ?- ABC's proximal shoulder/scapular stability with 1# wrist weight ?- Proximal shoulder strengthening: 1# wrist weight, used washcloth, 1' flexion ? ? ? ? ?  ASSESSMENT: ?  ?CLINICAL IMPRESSION: A:Continued to work on shoulder and scapular stability and endurance. Added weighted row with weight machine and ball on the wall. Reports band activities as most challenging. VC provided for form and technique throughout session.  ? ? ?PLAN: ? ? ?OT FREQUENCY: 2x/week ? ?OT DURATION: 4 weeks ? ?PLANNED INTERVENTIONS: self care/ADL training, therapeutic exercise, therapeutic activity, neuromuscular re-education, manual therapy, passive range of motion, electrical stimulation, ultrasound, moist heat, cryotherapy, patient/family education, and DME and/or AE instructions ? ? ? ?CONSULTED AND AGREED WITH PLAN OF CARE: Patient ? ?PLAN FOR NEXT SESSION: P: Continue with scapular and shoulder stability and endurance. Resume ABC' s writing with 1/5lb wrist weight, bodycraft row and press.   ? ? ? ?Ailene Ravel, OTR/L,CBIS  ?[phone] ? ?10/23/2021, 11:11 AM ? ?  ? ?  ?

## 2021-10-26 ENCOUNTER — Encounter (HOSPITAL_COMMUNITY): Payer: 59

## 2021-10-28 ENCOUNTER — Encounter (HOSPITAL_COMMUNITY): Payer: 59

## 2021-10-30 ENCOUNTER — Encounter (HOSPITAL_COMMUNITY): Payer: 59 | Admitting: Occupational Therapy

## 2021-11-02 ENCOUNTER — Encounter (HOSPITAL_COMMUNITY): Payer: 59

## 2021-11-05 ENCOUNTER — Encounter (HOSPITAL_COMMUNITY): Payer: 59

## 2021-11-19 ENCOUNTER — Ambulatory Visit (INDEPENDENT_AMBULATORY_CARE_PROVIDER_SITE_OTHER): Payer: 59

## 2021-11-19 ENCOUNTER — Ambulatory Visit (INDEPENDENT_AMBULATORY_CARE_PROVIDER_SITE_OTHER): Payer: 59 | Admitting: Orthopedic Surgery

## 2021-11-19 VITALS — Ht 71.0 in | Wt 185.0 lb

## 2021-11-19 DIAGNOSIS — M1712 Unilateral primary osteoarthritis, left knee: Secondary | ICD-10-CM | POA: Diagnosis not present

## 2021-11-20 ENCOUNTER — Encounter: Payer: Self-pay | Admitting: Orthopedic Surgery

## 2021-11-20 NOTE — Progress Notes (Signed)
? ?Post-Op Visit Note ?  ?Patient: Keith [name].           ?Date of Birth: April 26, 1941           ?MRN: [id number] ?Visit Date: 11/19/2021 ?PCP: Celene Squibb, MD ? ? ?Assessment & Plan: ? ?Chief Complaint:  ?Chief Complaint  ?Patient presents with  ? Left Knee - Pain  ? ?Visit Diagnoses:  ?1. Primary osteoarthritis of left knee   ? ? ?Plan: Keith [name] is a 81 year old patient with left reverse shoulder replacement.  He is doing well with that shoulder replacement.  He would like to have left knee replacement.  Describes weakness giving way locking stiffness as well as pain that wakes him from sleep at night.  Also reports decreased walking endurance.  He sees a cardiologist in [place].  Recently underwent left reverse replacement with no complicating perioperative events.  His wife is a Software engineer.  He would like to get this done sometime within the next several months. ? ?On examination the left shoulder has good range of motion with no pain and improving strength.  Left knee has range of motion of of 5 to approximately 95.  Collateral ligaments are stable.  Extensor mechanism intact.  Has slight fullness in the posterior aspect of the knee without discrete Baker's cyst.  Effusion is present.  Pedal pulses palpable.  Ankle dorsiflexion intact. ? ?Impression is left knee arthritis.  As mentioned in the last note I discussed with Keith [name] today how Dr. Ninfa Linden is an excellent knee surgeon and does many total knees.  Keith [name] had successful hip replacement with Dr. Ninfa Linden who sent him over for his shoulder replacement.  Nonetheless he would like to have me perform his knee replacement. ?The risk and benefits of the knee replacement are discussed including not limited to infection nerve vessel damage knee stiffness incomplete pain relief as well as the more extensive nature of the rehabilitation required for knee replacement as opposed to shoulder or hip replacement.  Patient understands and wishes to proceed. ? ?Follow-Up  Instructions: No follow-ups on file.  ? ?Orders:  ?Orders Placed This Encounter  ?Procedures  ? XR KNEE 3 VIEW LEFT  ? ?No orders of the defined types were placed in this encounter. ? ? ?Imaging: ?XR KNEE 3 VIEW LEFT ? ?Result Date: 11/20/2021 ?AP lateral merchant radiographs left knee reviewed.  Severe tricompartmental osteoarthritis is present most severe in the medial compartment.  Bones appear slightly osteopenic.  No acute fracture.  Varus alignment present  ? ?PMFS History: ?Patient Active Problem List  ? Diagnosis Date Noted  ? Arthritis of left shoulder region   ? S/P reverse total shoulder arthroplasty, left 09/01/2021  ? Status post left hip replacement 12/19/2020  ? Primary osteoarthritis of left hip 12/04/2020  ? Hyperlipidemia 04/30/2020  ? Cataract 10/10/2019  ? Right ureteral stone 10/01/2019  ? Left ureteral stone 10/01/2019  ? S/P lumbar fusion 06/15/2019  ? Acquired hypothyroidism 08/10/2017  ? Benign prostatic hyperplasia with nocturia 08/10/2017  ? S/P lumbar laminectomy 06/01/2017  ? PAF (paroxysmal atrial fibrillation) ([place]) 09/07/2016  ? S/P AVR 01/18/2014  ? Hypertension 02/22/2013  ? ?Past Medical History:  ?Diagnosis Date  ? Arthritis   ? Atrial fibrillation ([place])   ? Cardioversion 2016  ? Atrial flutter ([place])   ? Cardioversion 2013  ? Blood transfusion without reported diagnosis   ? Cancer [facility])   ? Skin- Basil/Squamous cell  ? Dysrhythmia   ? GERD (gastroesophageal reflux disease)   ?  History of aortic valve replacement with bioprosthetic valve   ? Aortic regurgitation  ? History of chicken pox   ? History of kidney stones   ? History of skin cancer   ? History of stroke 2011  ? Hypothyroidism   ? Kidney stone   ? Seasonal allergies   ? Stroke [facility])   ? after heart surgery 2010  ?  ?Family History  ?Problem Relation Age of Onset  ? Colon cancer Neg Hx   ? Esophageal cancer Neg Hx   ? Rectal cancer Neg Hx   ? Stomach cancer Neg Hx   ?  ?Past Surgical History:  ?Procedure Laterality Date  ?  AORTIC VALVE REPLACEMENT  2009  ? Bioprosthetic AVR and root replacement  ? BACK SURGERY    ? total of 4 back surgeries  ? CATARACT EXTRACTION, BILATERAL    ? COLONOSCOPY    ? CYSTOSCOPY WITH RETROGRADE PYELOGRAM, URETEROSCOPY AND STENT PLACEMENT Bilateral 10/01/2019  ? Procedure: CYSTOSCOPY WITH RETROGRADE PYELOGRAM, URETEROSCOPY AND STENT PLACEMENT;  Surgeon: Irine Seal, MD;  Location: [facility];  Service: Urology;  Laterality: Bilateral;  ? EPIGASTRIC HERNIA REPAIR    ? HERNIA REPAIR    ? LUMBAR LAMINECTOMY/DECOMPRESSION MICRODISCECTOMY Right 06/01/2017  ? Procedure: EXTRAFORAMINAL MICRODISCECTOMY LUMBAR FIVE- SACRAL ONE, RIGHT;  Surgeon: Eustace Moore, MD;  Location: [place];  Service: Neurosurgery;  Laterality: Right;  ? LUMBAR LAMINECTOMY/DECOMPRESSION MICRODISCECTOMY Bilateral 09/04/2018  ? Procedure: Laminectomy and Foraminotomy - Lumbar two-three bilateral;  Surgeon: Eustace Moore, MD;  Location: [place];  Service: Neurosurgery;  Laterality: Bilateral;  ? REVERSE SHOULDER ARTHROPLASTY Left 09/01/2021  ? Procedure: LEFT REVERSE SHOULDER ARTHROPLASTY;  Surgeon: Meredith Pel, MD;  Location: [place];  Service: Orthopedics;  Laterality: Left;  ? TOTAL HIP ARTHROPLASTY Left 12/19/2020  ? Procedure: LEFT TOTAL HIP ARTHROPLASTY ANTERIOR APPROACH;  Surgeon: Mcarthur Rossetti, MD;  [facility];  Service: Orthopedics;  Laterality: Left;  ? TRANSFORAMINAL LUMBAR INTERBODY FUSION (TLIF) WITH PEDICLE SCREW FIXATION 1 LEVEL Left 06/15/2019  ? Procedure: Decompression and Instrumention Fusion Lumbar Four- Five;  Surgeon: Eustace Moore, MD;  Location: [place];  Service: Neurosurgery;  Laterality: Left;  Decompression and Instrumention Fusion Lumbar Four- Five  ? ?Social History  ? ?Occupational History  ? Occupation: Chief Strategy Officer  ?Tobacco Use  ? Smoking status: Never  ? Smokeless tobacco: Never  ?Vaping Use  ? Vaping Use: Never used  ?Substance and Sexual Activity  ? Alcohol use: Yes  ?  Comment: occasional  ? Drug  use: No  ? Sexual activity: Yes  ? ? ? ?

## 2021-12-10 ENCOUNTER — Other Ambulatory Visit (HOSPITAL_COMMUNITY): Payer: Self-pay

## 2021-12-22 NOTE — Progress Notes (Signed)
Surgical Instructions    Your procedure is scheduled on Thursday, June 22nd, 2023.   Report to [facility] Main Entrance "A" at 05:30 A.M., then check in with the Admitting office.  Call this number if you have problems the morning of surgery:  [phone]   If you have any questions prior to your surgery date call [phone]: Open Monday-Friday 8am-4pm    Remember:  Do not eat after midnight the night before your surgery  You may drink clear liquids until 04:30 the morning of your surgery.   Clear liquids allowed are: Water, Non-Citrus Juices (without pulp), Carbonated Beverages, Clear Tea, Black Coffee ONLY (NO MILK, CREAM OR POWDERED CREAMER of any kind), and Gatorade    Take these medicines the morning of surgery with A SIP OF WATER:   cetirizine (ZYRTEC) levothyroxine (SYNTHROID)   famotidine (PEPCID) - if needed  Follow your surgeon's instructions on when to stop apixaban (ELIQUIS). If no instructions were given by your surgeon then you will need to call the office to get those instructions.     As of today, STOP taking any Aspirin (unless otherwise instructed by your surgeon) Aleve, Naproxen, Ibuprofen, Motrin, Advil, Goody's, BC's, all herbal medications, fish oil, and all vitamins.    The day of surgery:          Do not wear jewelry  Do not wear lotions, powders, colognes, or deodorant. Men may shave face and neck. Do not bring valuables to the hospital.   [facility] is not responsible for any belongings or valuables. .   Do NOT Smoke (Tobacco/Vaping)  24 hours prior to your procedure  If you use a CPAP at night, you may bring your mask for your overnight stay.   Contacts, glasses, hearing aids, dentures or partials may not be worn into surgery, please bring cases for these belongings   For patients admitted to the hospital, discharge time will be determined by your treatment team.   Patients discharged the day of surgery will not be allowed to drive home,  and someone needs to stay with them for 24 hours.   SURGICAL WAITING ROOM VISITATION Patients having surgery or a procedure in a hospital may have two support people. Children under the age of 2 must have an adult with them who is not the patient. They may stay in the waiting area during the procedure and may switch out with other visitors. If the patient needs to stay at the hospital during part of their recovery, the visitor guidelines for inpatient rooms apply.  Please refer to the [facility] website for the visitor guidelines for Inpatients (after your surgery is over and you are in a regular room).    Special instructions:    Oral Hygiene is also important to reduce your risk of infection.  Remember - BRUSH YOUR TEETH THE MORNING OF SURGERY WITH YOUR REGULAR TOOTHPASTE   [place]- Preparing For Surgery  Before surgery, you can play an important role. Because skin is not sterile, your skin needs to be as free of germs as possible. You can reduce the number of germs on your skin by washing with CHG (chlorahexidine gluconate) Soap before surgery.  CHG is an antiseptic cleaner which kills germs and bonds with the skin to continue killing germs even after washing.     Please do not use if you have an allergy to CHG or antibacterial soaps. If your skin becomes reddened/irritated stop using the CHG.  Do not shave (including legs and  underarms) for at least 48 hours prior to first CHG shower. It is OK to shave your face.  Please follow these instructions carefully.     Shower the NIGHT BEFORE SURGERY and the MORNING OF SURGERY with CHG Soap.   If you chose to wash your hair, wash your hair first as usual with your normal shampoo. After you shampoo, rinse your hair and body thoroughly to remove the shampoo.  Then [employer] and genitals (private parts) with your normal soap and rinse thoroughly to remove soap.  After that Use CHG Soap as you would any other liquid soap. You can apply CHG  directly to the skin and wash gently with a scrungie or a clean washcloth.   Apply the CHG Soap to your body ONLY FROM THE NECK DOWN.  Do not use on open wounds or open sores. Avoid contact with your eyes, ears, mouth and genitals (private parts). Wash Face and genitals (private parts)  with your normal soap.   Wash thoroughly, paying special attention to the area where your surgery will be performed.  Thoroughly rinse your body with warm water from the neck down.  DO NOT shower/wash with your normal soap after using and rinsing off the CHG Soap.  Pat yourself dry with a CLEAN TOWEL.  Wear CLEAN PAJAMAS to bed the night before surgery  Place CLEAN SHEETS on your bed the night before your surgery  DO NOT SLEEP WITH PETS.   Day of Surgery:  Take a shower with CHG soap. Wear Clean/Comfortable clothing the morning of surgery Do not apply any deodorants/lotions.   Remember to brush your teeth WITH YOUR REGULAR TOOTHPASTE.    If you received a COVID test during your pre-op visit, it is requested that you wear a mask when out in public, stay away from anyone that may not be feeling well, and notify your surgeon if you develop symptoms. If you have been in contact with anyone that has tested positive in the last 10 days, please notify your surgeon.    Please read over the following fact sheets that you were given.

## 2021-12-23 ENCOUNTER — Encounter (HOSPITAL_COMMUNITY)
Admission: RE | Admit: 2021-12-23 | Discharge: 2021-12-23 | Disposition: A | Discharge status: Home / Self Care | Payer: 59 | Source: Ambulatory Visit | Attending: Orthopedic Surgery | Admitting: Orthopedic Surgery

## 2021-12-23 ENCOUNTER — Encounter (HOSPITAL_COMMUNITY): Payer: Self-pay

## 2021-12-23 ENCOUNTER — Other Ambulatory Visit: Payer: Self-pay

## 2021-12-23 VITALS — BP 144/81 | HR 60 | Temp 97.7°F | Resp 17 | Ht 71.0 in | Wt 185.1 lb

## 2021-12-23 DIAGNOSIS — I4891 Unspecified atrial fibrillation: Secondary | ICD-10-CM | POA: Diagnosis not present

## 2021-12-23 DIAGNOSIS — Z01812 Encounter for preprocedural laboratory examination: Secondary | ICD-10-CM | POA: Insufficient documentation

## 2021-12-23 DIAGNOSIS — Z01818 Encounter for other preprocedural examination: Secondary | ICD-10-CM

## 2021-12-23 DIAGNOSIS — Z7901 Long term (current) use of anticoagulants: Secondary | ICD-10-CM | POA: Insufficient documentation

## 2021-12-23 DIAGNOSIS — M1712 Unilateral primary osteoarthritis, left knee: Secondary | ICD-10-CM | POA: Diagnosis not present

## 2021-12-23 DIAGNOSIS — I4892 Unspecified atrial flutter: Secondary | ICD-10-CM | POA: Diagnosis not present

## 2021-12-23 DIAGNOSIS — I10 Essential (primary) hypertension: Secondary | ICD-10-CM | POA: Insufficient documentation

## 2021-12-23 LAB — BASIC METABOLIC PANEL
Anion gap: 7 (ref 5–15)
BUN: 25 mg/dL — ABNORMAL HIGH (ref 8–23)
CO2: 25 mmol/L (ref 22–32)
Calcium: 9.1 mg/dL (ref 8.9–10.3)
Chloride: 106 mmol/L (ref 98–111)
Creatinine, Ser: 1.15 mg/dL (ref 0.61–1.24)
GFR, Estimated: >60 mL/min (ref >60)
Glucose, Bld: 97 mg/dL (ref 70–99)
Potassium: 3.8 mmol/L (ref 3.5–5.1)
Sodium: 138 mmol/L (ref 135–145)

## 2021-12-23 LAB — CBC
HCT: 43.3 % (ref 39.0–52.0)
Hemoglobin: 14.9 g/dL (ref 13.0–17.0)
MCH: 30.9 pg (ref 26.0–34.0)
MCHC: 34.4 g/dL (ref 30.0–36.0)
MCV: 89.8 fL (ref 80.0–100.0)
Platelets: 175 10*3/uL (ref 150–400)
RBC: 4.82 MIL/uL (ref 4.22–5.81)
RDW: 13.2 % (ref 11.5–15.5)
WBC: 5.7 10*3/uL (ref 4.0–10.5)
nRBC: 0 % (ref 0.0–0.2)

## 2021-12-23 LAB — SURGICAL PCR SCREEN
MRSA, PCR: NEGATIVE
Staphylococcus aureus: NEGATIVE

## 2021-12-23 NOTE — Progress Notes (Signed)
PCP - Allyn Kenner Cardiologist - Dr. Evie Lacks [place], [place]  PPM/ICD - Denies Device Orders -  Rep Notified -   Chest x-ray - NI EKG - 08/28/21 Stress Test - Yes  ECHO - 07/21/20 Cardiac Cath - 02/08/13  Sleep Study - Denies  DM- Denies  Blood Thinner Instructions: Per patient instructed to stop Eliquis on 12/21/21    Anesthesia review: Yes cardiac history   Patient denies shortness of breath, fever, cough and chest pain at PAT appointment   All instructions explained to the patient, with a verbal understanding of the material. Patient agrees to go over the instructions while at home for a better understanding. The opportunity to ask questions was provided.

## 2021-12-24 NOTE — Progress Notes (Addendum)
Anesthesia Chart Review:  Case: [id number] Date/Time: 12/31/21 0715   Procedure: TOTAL KNEE ARTHROPLASTY (Left: Knee)   Anesthesia type: Spinal   Pre-op diagnosis: left knee osteoarthritis   Location: [facility] OR ROOM 07 / [place] [facility]   Surgeons: Meredith Pel, MD       DISCUSSION: Patient is an 81 year old male scheduled for the above procedure.  History includes never smoker, severe AI with dilated aortic root (s/p 27 mm Magna bioprosthetic AVR and aortic root replacement 02/27/08), atrial fibrillation/flutter (DCCV 01/07/12, 06/26/15; on Eliquis), CVA (09/2009), HTN, hypothyroidism, spinal surgery (right L5-S1 microdiscectomy 06/01/17; L2-3 laminectomy 09/04/18; L4-5 PLIF 06/15/19), epigastric hernia (s/p repair), nephrolithiasis (double-J stents, stone extraction 10/01/19), osteoarthritis (left THA 12/19/20; left reverse total shoulder 09/01/21).  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    Most recent echo 07/21/20 showed EF 60 to 65%, severely dilated left atrium, bioprosthetic aortic valve without regurgitation or stenosis, mild dilatation of the aortic root 43 mm.   His cardiologist Dr. Lorne Skeens had classified him a low risk for surgery and gave permission to temporarily hold Eliquis for surgery and resume afterwards.   Anesthesia team to evaluate on the day of surgery.   VS: BP (!) 144/81   Pulse 60   Temp 36.5 C   Resp 17   Ht '5\' 11"'$  (1.803 m)   Wt 84 kg   SpO2 99%   BMI 25.82 kg/m    PROVIDERS: Celene Squibb, MD is PCP  Cline Cools, DO is cardiologist ([place], phone [phone]; see Care Everywhere)   LABS: Labs reviewed: Acceptable for surgery. (all labs ordered are listed, but only abnormal results are displayed)  Labs Reviewed  BASIC METABOLIC PANEL - Abnormal; Notable for the following components:      Result Value   BUN 25 (*)    All other components within normal limits  SURGICAL PCR SCREEN   CBC    EKG: 08/28/2021:  Atrial fibrillation Nonspecific ST and T wave abnormality Abnormal ECG When compared with ECG of 12-Jun-2019 09:26, PREVIOUS ECG IS PRESENT No significant change since last tracing Confirmed by Quay Burow [phone]) on 08/28/2021 3:56:27 PM    CV: TTE 07/21/2020:  1. Left ventricular ejection fraction, by estimation, is 60 to 65%. The  left ventricle has normal function. The left ventricle has no regional  wall motion abnormalities. There is mild concentric left ventricular  hypertrophy. Left ventricular diastolic  parameters are indeterminate.   2. Right ventricular systolic function is moderately reduced. The right  ventricular size is normal.   3. Left atrial size was severely dilated.   4. The mitral valve is normal in structure. Trivial mitral valve  regurgitation.   5. The aortic valve has been repaired/replaced. Aortic valve  regurgitation is not visualized. No aortic stenosis is present. There is a  27 mm Magna valve present in the aortic position. Procedure Date: 2009.   6. Aortic dilatation noted. There is mild dilatation of the aortic root,  measuring 43 mm.   7. The inferior vena cava is normal in size with greater than 50%  respiratory variability, suggesting right atrial pressure of 3 mmHg.  - Comparison(s): A prior study was performed on 09/13/2017. Slight increase in  aortic root. There is slight increase in prosthetic aortic valve  acceleration time (126 ms) without change in gradients suggestive of  obstruction.     Cardiac cath 01/18/2008 (Media tab,  Correspondence, Encounter 06/01/17; copy received from Dr. Randye Lobo office): 1. Normal-appearing coronary arteries with no evidence of any significant atherosclerotic coronary disease by angiography.  2. 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  3. Normal left ventricular systolic function.  (Patient subsequently underwent AVR 02/27/08.)    Past Medical History:  Diagnosis Date   Arthritis     Atrial fibrillation [facility])    Cardioversion 2016   Atrial flutter [facility])    Cardioversion 2013   Blood transfusion without reported diagnosis    Cancer ([place])    Skin- Basil/Squamous cell   Dysrhythmia    GERD (gastroesophageal reflux disease)    History of aortic valve replacement with bioprosthetic valve    Aortic regurgitation   History of chicken pox    History of kidney stones    History of skin cancer    History of stroke 2011   Hypothyroidism    Kidney stone    Seasonal allergies    Stroke [facility])    after heart surgery 2010    Past Surgical History:  Procedure Laterality Date   AORTIC VALVE REPLACEMENT  2009   Bioprosthetic AVR and root replacement   BACK SURGERY     total of 4 back surgeries   CATARACT EXTRACTION, BILATERAL     COLONOSCOPY     CYSTOSCOPY WITH RETROGRADE PYELOGRAM, URETEROSCOPY AND STENT PLACEMENT Bilateral 10/01/2019   Procedure: CYSTOSCOPY WITH RETROGRADE PYELOGRAM, URETEROSCOPY AND STENT PLACEMENT;  Surgeon: Irine Seal, MD;  [facility];  Service: Urology;  Laterality: Bilateral;   EPIGASTRIC HERNIA REPAIR     HERNIA REPAIR     LUMBAR LAMINECTOMY/DECOMPRESSION MICRODISCECTOMY Right 06/01/2017   Procedure: EXTRAFORAMINAL MICRODISCECTOMY LUMBAR FIVE- SACRAL ONE, RIGHT;  Surgeon: Eustace Moore, MD;  Location: [place];  Service: Neurosurgery;  Laterality: Right;   LUMBAR LAMINECTOMY/DECOMPRESSION MICRODISCECTOMY Bilateral 09/04/2018   Procedure: Laminectomy and Foraminotomy - Lumbar two-three bilateral;  Surgeon: Eustace Moore, MD;  Location: [place];  Service: Neurosurgery;  Laterality: Bilateral;   REVERSE SHOULDER ARTHROPLASTY Left 09/01/2021   Procedure: LEFT REVERSE SHOULDER ARTHROPLASTY;  Surgeon: Meredith Pel, MD;  Location: [place];  Service: Orthopedics;  Laterality: Left;   TOTAL HIP ARTHROPLASTY Left 12/19/2020   Procedure: LEFT TOTAL HIP ARTHROPLASTY ANTERIOR APPROACH;  Surgeon: Mcarthur Rossetti, MD;  [facility];  Service:  Orthopedics;  Laterality: Left;   TRANSFORAMINAL LUMBAR INTERBODY FUSION (TLIF) WITH PEDICLE SCREW FIXATION 1 LEVEL Left 06/15/2019   Procedure: Decompression and Instrumention Fusion Lumbar Four- Five;  Surgeon: Eustace Moore, MD;  Location: [place];  Service: Neurosurgery;  Laterality: Left;  Decompression and Instrumention Fusion Lumbar Four- Five    MEDICATIONS:  apixaban (ELIQUIS) 5 MG TABS tablet   cetirizine (ZYRTEC) 10 MG tablet   diphenhydrAMINE (BENADRYL) 25 MG tablet   famotidine (PEPCID) 10 MG tablet   levothyroxine (SYNTHROID) 50 MCG tablet   lisinopril-hydrochlorothiazide (ZESTORETIC) 10-12.5 MG tablet   methocarbamol (ROBAXIN) 500 MG tablet   oxyCODONE-acetaminophen (PERCOCET/ROXICET) 5-325 MG tablet   polyethylene glycol (MIRALAX / GLYCOLAX) 17 g packet   tamsulosin (FLOMAX) 0.4 MG CAPS capsule   No current facility-administered medications for this encounter.    Myra Gianotti, PA-C Surgical Short Stay/Anesthesiology [facility] Phone [phone] [facility] Phone [phone] 12/25/2021 1:31 PM

## 2021-12-25 ENCOUNTER — Other Ambulatory Visit (HOSPITAL_COMMUNITY): Payer: 59

## 2021-12-25 NOTE — Anesthesia Preprocedure Evaluation (Signed)
Anesthesia Evaluation Anesthesia Physical Anesthesia Plan  ASA:   Anesthesia Plan:    Post-op Pain Management:    Induction:   PONV Risk Score and Plan:   Airway Management Planned:   Additional Equipment:   Intra-op Plan:   Post-operative Plan:   Informed Consent:   Plan Discussed with:   Anesthesia Plan Comments: (PAT note written 12/25/2021 by Myra Gianotti, PA-C. )        Anesthesia Quick Evaluation

## 2021-12-30 MED ORDER — TRANEXAMIC ACID 1000 MG/10ML IV SOLN
2000.0000 mg | INTRAVENOUS | Status: DC
  Filled 2021-12-30: qty 20

## 2021-12-31 ENCOUNTER — Other Ambulatory Visit: Payer: Self-pay

## 2021-12-31 ENCOUNTER — Observation Stay (HOSPITAL_COMMUNITY)
Admission: RE | Admit: 2021-12-31 | Discharge: 2022-01-02 | Disposition: A | Discharge status: Home Health | Payer: 59 | Attending: Orthopedic Surgery | Admitting: Orthopedic Surgery

## 2021-12-31 ENCOUNTER — Ambulatory Visit (HOSPITAL_COMMUNITY): Payer: 59 | Admitting: Vascular Surgery

## 2021-12-31 ENCOUNTER — Telehealth: Payer: Self-pay | Admitting: Orthopedic Surgery

## 2021-12-31 ENCOUNTER — Encounter (HOSPITAL_COMMUNITY)
Admission: RE | Disposition: A | Discharge status: Home Health | Payer: Self-pay | Source: Home / Self Care | Attending: Orthopedic Surgery

## 2021-12-31 ENCOUNTER — Encounter (HOSPITAL_COMMUNITY): Payer: Self-pay | Admitting: Orthopedic Surgery

## 2021-12-31 ENCOUNTER — Ambulatory Visit (HOSPITAL_BASED_OUTPATIENT_CLINIC_OR_DEPARTMENT_OTHER): Payer: 59 | Admitting: Certified Registered Nurse Anesthetist

## 2021-12-31 DIAGNOSIS — Z01818 Encounter for other preprocedural examination: Secondary | ICD-10-CM

## 2021-12-31 DIAGNOSIS — Z79899 Other long term (current) drug therapy: Secondary | ICD-10-CM | POA: Insufficient documentation

## 2021-12-31 DIAGNOSIS — Z7901 Long term (current) use of anticoagulants: Secondary | ICD-10-CM | POA: Insufficient documentation

## 2021-12-31 DIAGNOSIS — Z96612 Presence of left artificial shoulder joint: Secondary | ICD-10-CM | POA: Diagnosis not present

## 2021-12-31 DIAGNOSIS — Z96642 Presence of left artificial hip joint: Secondary | ICD-10-CM | POA: Diagnosis not present

## 2021-12-31 DIAGNOSIS — I4891 Unspecified atrial fibrillation: Secondary | ICD-10-CM

## 2021-12-31 DIAGNOSIS — M1712 Unilateral primary osteoarthritis, left knee: Secondary | ICD-10-CM

## 2021-12-31 DIAGNOSIS — E039 Hypothyroidism, unspecified: Secondary | ICD-10-CM | POA: Diagnosis not present

## 2021-12-31 DIAGNOSIS — Z96652 Presence of left artificial knee joint: Secondary | ICD-10-CM

## 2021-12-31 DIAGNOSIS — I10 Essential (primary) hypertension: Secondary | ICD-10-CM | POA: Insufficient documentation

## 2021-12-31 DIAGNOSIS — Z8673 Personal history of transient ischemic attack (TIA), and cerebral infarction without residual deficits: Secondary | ICD-10-CM | POA: Insufficient documentation

## 2021-12-31 DIAGNOSIS — Z85828 Personal history of other malignant neoplasm of skin: Secondary | ICD-10-CM | POA: Diagnosis not present

## 2021-12-31 DIAGNOSIS — G8918 Other acute postprocedural pain: Secondary | ICD-10-CM | POA: Diagnosis not present

## 2021-12-31 HISTORY — PX: TOTAL KNEE ARTHROPLASTY: SHX125

## 2021-12-31 SURGERY — ARTHROPLASTY, KNEE, TOTAL
Anesthesia: Spinal | Site: Knee | Laterality: Left

## 2021-12-31 MED ORDER — VANCOMYCIN HCL 1000 MG IV SOLR
INTRAVENOUS | Status: DC | PRN
  Administered 2021-12-31: 1000 mg via TOPICAL

## 2021-12-31 MED ORDER — PROPOFOL 500 MG/50ML IV EMUL
INTRAVENOUS | Status: DC | PRN
  Administered 2021-12-31: 75 ug/kg/min via INTRAVENOUS

## 2021-12-31 MED ORDER — ACETAMINOPHEN 500 MG PO TABS
ORAL_TABLET | ORAL | Status: AC
  Administered 2021-12-31: 1000 mg via ORAL
  Filled 2021-12-31: qty 2

## 2021-12-31 MED ORDER — SODIUM CHLORIDE 0.9% FLUSH
INTRAVENOUS | Status: DC | PRN
  Administered 2021-12-31: 20 mL

## 2021-12-31 MED ORDER — HYDROCHLOROTHIAZIDE 12.5 MG PO TABS
12.5000 mg | ORAL_TABLET | Freq: Every day | ORAL | Status: DC
  Administered 2022-01-01 – 2022-01-02 (×2): 12.5 mg via ORAL
  Filled 2021-12-31 (×2): qty 1

## 2021-12-31 MED ORDER — ACETAMINOPHEN 325 MG PO TABS: 325.0000 mg | ORAL_TABLET | Freq: Four times a day (QID) | ORAL | Status: DC | PRN

## 2021-12-31 MED ORDER — MENTHOL 3 MG MT LOZG: 1.0000 | LOZENGE | OROMUCOSAL | Status: DC | PRN

## 2021-12-31 MED ORDER — OXYCODONE HCL 5 MG PO TABS
ORAL_TABLET | ORAL | Status: AC
  Filled 2021-12-31: qty 1

## 2021-12-31 MED ORDER — MORPHINE SULFATE (PF) 4 MG/ML IV SOLN
INTRAVENOUS | Status: AC
  Filled 2021-12-31: qty 2

## 2021-12-31 MED ORDER — METHOCARBAMOL 500 MG PO TABS
500.0000 mg | ORAL_TABLET | Freq: Four times a day (QID) | ORAL | Status: DC | PRN
  Administered 2021-12-31 – 2022-01-02 (×6): 500 mg via ORAL
  Filled 2021-12-31 (×5): qty 1

## 2021-12-31 MED ORDER — METHOCARBAMOL 500 MG PO TABS
ORAL_TABLET | ORAL | Status: AC
  Filled 2021-12-31: qty 1

## 2021-12-31 MED ORDER — ACETAMINOPHEN 500 MG PO TABS
1000.0000 mg | ORAL_TABLET | Freq: Four times a day (QID) | ORAL | Status: AC
  Administered 2021-12-31 – 2022-01-01 (×4): 1000 mg via ORAL
  Filled 2021-12-31 (×3): qty 2

## 2021-12-31 MED ORDER — POVIDONE-IODINE 10 % EX SWAB: Freq: Once | CUTANEOUS | Status: DC

## 2021-12-31 MED ORDER — DOCUSATE SODIUM 100 MG PO CAPS
100.0000 mg | ORAL_CAPSULE | Freq: Two times a day (BID) | ORAL | Status: DC
  Administered 2021-12-31 – 2022-01-02 (×5): 100 mg via ORAL
  Filled 2021-12-31 (×5): qty 1

## 2021-12-31 MED ORDER — LISINOPRIL-HYDROCHLOROTHIAZIDE 10-12.5 MG PO TABS: 1.0000 | ORAL_TABLET | Freq: Every day | ORAL | Status: DC

## 2021-12-31 MED ORDER — ONDANSETRON HCL 4 MG/2ML IJ SOLN: 4.0000 mg | Freq: Four times a day (QID) | INTRAMUSCULAR | Status: DC | PRN

## 2021-12-31 MED ORDER — HYDROMORPHONE HCL 1 MG/ML IJ SOLN
0.2500 mg | INTRAMUSCULAR | Status: DC | PRN
  Administered 2021-12-31 (×4): 0.5 mg via INTRAVENOUS

## 2021-12-31 MED ORDER — MIDAZOLAM HCL 2 MG/2ML IJ SOLN: 0.5000 mg | Freq: Once | INTRAMUSCULAR | Status: DC | PRN

## 2021-12-31 MED ORDER — MIDAZOLAM HCL 2 MG/2ML IJ SOLN
INTRAMUSCULAR | Status: DC | PRN
  Administered 2021-12-31: 2 mg via INTRAVENOUS

## 2021-12-31 MED ORDER — SODIUM CHLORIDE 0.9 % IV SOLN: INTRAVENOUS | Status: AC

## 2021-12-31 MED ORDER — LACTATED RINGERS IV SOLN: INTRAVENOUS | Status: DC

## 2021-12-31 MED ORDER — BUPIVACAINE LIPOSOME 1.3 % IJ SUSP
INTRAMUSCULAR | Status: DC | PRN
  Administered 2021-12-31: 20 mL

## 2021-12-31 MED ORDER — OXYCODONE HCL 5 MG PO TABS
ORAL_TABLET | ORAL | Status: AC
  Filled 2021-12-31: qty 2

## 2021-12-31 MED ORDER — METOCLOPRAMIDE HCL 5 MG/ML IJ SOLN: 5.0000 mg | Freq: Three times a day (TID) | INTRAMUSCULAR | Status: DC | PRN

## 2021-12-31 MED ORDER — ORAL CARE MOUTH RINSE: 15.0000 mL | Freq: Once | OROMUCOSAL | Status: AC

## 2021-12-31 MED ORDER — VANCOMYCIN HCL 1000 MG IV SOLR
INTRAVENOUS | Status: AC
  Filled 2021-12-31: qty 20

## 2021-12-31 MED ORDER — BUPIVACAINE LIPOSOME 1.3 % IJ SUSP
INTRAMUSCULAR | Status: AC
  Filled 2021-12-31: qty 20

## 2021-12-31 MED ORDER — 0.9 % SODIUM CHLORIDE (POUR BTL) OPTIME
TOPICAL | Status: DC | PRN
  Administered 2021-12-31 (×4): 1000 mL

## 2021-12-31 MED ORDER — CEFAZOLIN SODIUM-DEXTROSE 2-4 GM/100ML-% IV SOLN
INTRAVENOUS | Status: AC
  Filled 2021-12-31: qty 100

## 2021-12-31 MED ORDER — ONDANSETRON HCL 4 MG/2ML IJ SOLN
INTRAMUSCULAR | Status: DC | PRN
  Administered 2021-12-31: 4 mg via INTRAVENOUS

## 2021-12-31 MED ORDER — POVIDONE-IODINE 10 % EX SWAB: 2.0000 "application " | Freq: Once | CUTANEOUS | Status: DC

## 2021-12-31 MED ORDER — IRRISEPT - 450ML BOTTLE WITH 0.05% CHG IN STERILE WATER, USP 99.95% OPTIME
TOPICAL | Status: DC | PRN
  Administered 2021-12-31: 450 mL via TOPICAL

## 2021-12-31 MED ORDER — BUPIVACAINE HCL (PF) 0.25 % IJ SOLN
INTRAMUSCULAR | Status: AC
  Filled 2021-12-31: qty 30

## 2021-12-31 MED ORDER — HYDROMORPHONE HCL 1 MG/ML IJ SOLN
0.5000 mg | INTRAMUSCULAR | Status: DC | PRN
  Administered 2021-12-31: 1 mg via INTRAVENOUS
  Filled 2021-12-31: qty 1

## 2021-12-31 MED ORDER — FENTANYL CITRATE (PF) 100 MCG/2ML IJ SOLN
INTRAMUSCULAR | Status: AC
  Filled 2021-12-31: qty 2

## 2021-12-31 MED ORDER — ROPIVACAINE HCL 7.5 MG/ML IJ SOLN
INTRAMUSCULAR | Status: DC | PRN
  Administered 2021-12-31: 20 mL via PERINEURAL

## 2021-12-31 MED ORDER — FAMOTIDINE 10 MG PO TABS: 10.0000 mg | ORAL_TABLET | Freq: Every day | ORAL | Status: DC | PRN

## 2021-12-31 MED ORDER — METOCLOPRAMIDE HCL 5 MG PO TABS: 5.0000 mg | ORAL_TABLET | Freq: Three times a day (TID) | ORAL | Status: DC | PRN

## 2021-12-31 MED ORDER — DIPHENHYDRAMINE HCL 25 MG PO CAPS
25.0000 mg | ORAL_CAPSULE | Freq: Every evening | ORAL | Status: DC | PRN
Start: 2021-12-31 — End: 2022-01-02

## 2021-12-31 MED ORDER — ACETAMINOPHEN 500 MG PO TABS
1000.0000 mg | ORAL_TABLET | Freq: Once | ORAL | Status: AC
Start: 2021-12-31 — End: 2021-12-31

## 2021-12-31 MED ORDER — FENTANYL CITRATE (PF) 100 MCG/2ML IJ SOLN
INTRAMUSCULAR | Status: DC | PRN
  Administered 2021-12-31 (×2): 50 ug via INTRAVENOUS

## 2021-12-31 MED ORDER — CLONIDINE HCL (ANALGESIA) 100 MCG/ML EP SOLN
EPIDURAL | Status: AC
Start: 2021-12-31 — End: ?
  Filled 2021-12-31: qty 10

## 2021-12-31 MED ORDER — PROPOFOL 10 MG/ML IV BOLUS
INTRAVENOUS | Status: DC | PRN
  Administered 2021-12-31: 20 mg via INTRAVENOUS

## 2021-12-31 MED ORDER — BUPIVACAINE IN DEXTROSE 0.75-8.25 % IT SOLN
INTRATHECAL | Status: DC | PRN
  Administered 2021-12-31: 13.5 mg via INTRATHECAL

## 2021-12-31 MED ORDER — ACETAMINOPHEN 500 MG PO TABS
ORAL_TABLET | ORAL | Status: AC
  Filled 2021-12-31: qty 2

## 2021-12-31 MED ORDER — LEVOTHYROXINE SODIUM 50 MCG PO TABS
50.0000 ug | ORAL_TABLET | Freq: Every day | ORAL | Status: DC
Start: 2022-01-01 — End: 2022-01-02
  Administered 2022-01-01 – 2022-01-02 (×2): 50 ug via ORAL
  Filled 2021-12-31 (×2): qty 1

## 2021-12-31 MED ORDER — TRANEXAMIC ACID 1000 MG/10ML IV SOLN
INTRAVENOUS | Status: DC | PRN
  Administered 2021-12-31: 2000 mg via TOPICAL

## 2021-12-31 MED ORDER — PHENYLEPHRINE HCL-NACL 20-0.9 MG/250ML-% IV SOLN
INTRAVENOUS | Status: DC | PRN
  Administered 2021-12-31: 20 ug/min via INTRAVENOUS

## 2021-12-31 MED ORDER — METHOCARBAMOL 1000 MG/10ML IJ SOLN
500.0000 mg | Freq: Four times a day (QID) | INTRAVENOUS | Status: DC | PRN
  Filled 2021-12-31: qty 5

## 2021-12-31 MED ORDER — LISINOPRIL 10 MG PO TABS
10.0000 mg | ORAL_TABLET | Freq: Every day | ORAL | Status: DC
  Administered 2022-01-01 – 2022-01-02 (×2): 10 mg via ORAL
  Filled 2021-12-31 (×2): qty 1

## 2021-12-31 MED ORDER — CHLORHEXIDINE GLUCONATE 0.12 % MT SOLN
OROMUCOSAL | Status: AC
  Administered 2021-12-31: 15 mL via OROMUCOSAL
  Filled 2021-12-31: qty 15

## 2021-12-31 MED ORDER — BUPIVACAINE HCL 0.25 % IJ SOLN
INTRAMUSCULAR | Status: DC | PRN
  Administered 2021-12-31: 10 mL
  Administered 2021-12-31: 20 mL

## 2021-12-31 MED ORDER — ONDANSETRON HCL 4 MG PO TABS: 4.0000 mg | ORAL_TABLET | Freq: Four times a day (QID) | ORAL | Status: DC | PRN

## 2021-12-31 MED ORDER — TAMSULOSIN HCL 0.4 MG PO CAPS
0.4000 mg | ORAL_CAPSULE | Freq: Every day | ORAL | Status: DC
  Administered 2021-12-31 – 2022-01-01 (×2): 0.4 mg via ORAL
  Filled 2021-12-31 (×2): qty 1

## 2021-12-31 MED ORDER — MORPHINE SULFATE (PF) 4 MG/ML IV SOLN
INTRAVENOUS | Status: DC | PRN
  Administered 2021-12-31: 8 mg via INTRAVENOUS

## 2021-12-31 MED ORDER — OXYCODONE HCL 5 MG/5ML PO SOLN: 5.0000 mg | Freq: Once | ORAL | Status: AC | PRN

## 2021-12-31 MED ORDER — HYDROMORPHONE HCL 1 MG/ML IJ SOLN
INTRAMUSCULAR | Status: AC
  Filled 2021-12-31: qty 1

## 2021-12-31 MED ORDER — CEFAZOLIN SODIUM-DEXTROSE 2-4 GM/100ML-% IV SOLN
2.0000 g | INTRAVENOUS | Status: AC
  Administered 2021-12-31: 2 g via INTRAVENOUS

## 2021-12-31 MED ORDER — CEFAZOLIN SODIUM-DEXTROSE 2-4 GM/100ML-% IV SOLN
2.0000 g | Freq: Three times a day (TID) | INTRAVENOUS | Status: AC
  Administered 2021-12-31 (×2): 2 g via INTRAVENOUS
  Filled 2021-12-31 (×2): qty 100

## 2021-12-31 MED ORDER — TRANEXAMIC ACID-NACL 1000-0.7 MG/100ML-% IV SOLN
INTRAVENOUS | Status: AC
  Filled 2021-12-31: qty 100

## 2021-12-31 MED ORDER — MEPERIDINE HCL 25 MG/ML IJ SOLN: 6.2500 mg | INTRAMUSCULAR | Status: DC | PRN

## 2021-12-31 MED ORDER — OXYCODONE HCL 5 MG PO TABS
5.0000 mg | ORAL_TABLET | Freq: Once | ORAL | Status: AC | PRN
  Administered 2021-12-31: 5 mg via ORAL

## 2021-12-31 MED ORDER — PHENOL 1.4 % MT LIQD: 1.0000 | OROMUCOSAL | Status: DC | PRN

## 2021-12-31 MED ORDER — OXYCODONE HCL 5 MG PO TABS
5.0000 mg | ORAL_TABLET | ORAL | Status: DC | PRN
  Administered 2021-12-31 – 2022-01-02 (×7): 10 mg via ORAL
  Filled 2021-12-31 (×6): qty 2

## 2021-12-31 MED ORDER — EPHEDRINE SULFATE (PRESSORS) 50 MG/ML IJ SOLN
INTRAMUSCULAR | Status: DC | PRN
  Administered 2021-12-31: 15 mg via INTRAVENOUS
  Administered 2021-12-31: 10 mg via INTRAVENOUS

## 2021-12-31 MED ORDER — CHLORHEXIDINE GLUCONATE 0.12 % MT SOLN: 15.0000 mL | Freq: Once | OROMUCOSAL | Status: AC

## 2021-12-31 MED ORDER — ACETAMINOPHEN 500 MG PO TABS: 1000.0000 mg | ORAL_TABLET | Freq: Once | ORAL | Status: DC

## 2021-12-31 MED ORDER — MIDAZOLAM HCL 2 MG/2ML IJ SOLN
INTRAMUSCULAR | Status: AC
  Filled 2021-12-31: qty 2

## 2021-12-31 MED ORDER — CLONIDINE HCL (ANALGESIA) 100 MCG/ML EP SOLN
EPIDURAL | Status: DC | PRN
  Administered 2021-12-31: 1 mL

## 2021-12-31 MED ORDER — TRANEXAMIC ACID-NACL 1000-0.7 MG/100ML-% IV SOLN
1000.0000 mg | INTRAVENOUS | Status: AC
  Administered 2021-12-31: 1000 mg via INTRAVENOUS

## 2021-12-31 MED ORDER — POVIDONE-IODINE 7.5 % EX SOLN
Freq: Once | CUTANEOUS | Status: DC
  Filled 2021-12-31: qty 118

## 2021-12-31 MED ORDER — GLYCOPYRROLATE PF 0.2 MG/ML IJ SOSY
PREFILLED_SYRINGE | INTRAMUSCULAR | Status: DC | PRN
  Administered 2021-12-31: .2 mg via INTRAVENOUS

## 2021-12-31 MED ORDER — SODIUM CHLORIDE 0.9 % IR SOLN
Status: DC | PRN
  Administered 2021-12-31: 3000 mL

## 2021-12-31 MED ORDER — APIXABAN 5 MG PO TABS
5.0000 mg | ORAL_TABLET | Freq: Two times a day (BID) | ORAL | Status: DC
  Administered 2022-01-01 – 2022-01-02 (×3): 5 mg via ORAL
  Filled 2021-12-31 (×3): qty 1

## 2021-12-31 MED ORDER — EPINEPHRINE PF 1 MG/ML IJ SOLN
INTRAMUSCULAR | Status: AC
  Filled 2021-12-31: qty 4

## 2021-12-31 MED ORDER — CLONIDINE HCL (ANALGESIA) 100 MCG/ML EP SOLN
EPIDURAL | Status: AC
  Filled 2021-12-31: qty 10

## 2021-12-31 SURGICAL SUPPLY — 85 items
BAG COUNTER SPONGE SURGICOUNT (BAG) ×2 IMPLANT
BAG DECANTER FOR FLEXI CONT (MISCELLANEOUS) ×2 IMPLANT
BANDAGE ESMARK 6X9 LF (GAUZE/BANDAGES/DRESSINGS) ×1 IMPLANT
BLADE SAG 18X100X1.27 (BLADE) ×2 IMPLANT
BLADE SAGITTAL (BLADE) ×1
BLADE SAW THK.89X75X18XSGTL (BLADE) ×1 IMPLANT
BNDG COHESIVE 6X5 TAN STRL LF (GAUZE/BANDAGES/DRESSINGS) ×2 IMPLANT
BNDG ELASTIC 6X15 VLCR STRL LF (GAUZE/BANDAGES/DRESSINGS) ×2 IMPLANT
BNDG ESMARK 6X9 LF (GAUZE/BANDAGES/DRESSINGS) ×2
BOWL SMART MIX CTS (DISPOSABLE) ×1 IMPLANT
CEMENT BONE SIMPLEX SPEEDSET (Cement) ×1 IMPLANT
CNTNR URN SCR LID CUP LEK RST (MISCELLANEOUS) ×1 IMPLANT
COMP FEM KNEE STD PS 9 LT (Joint) ×2 IMPLANT
COMP TIB PS G 0D LT (Joint) ×2 IMPLANT
COMPONENT FEM KNEE STD PS 9 LT (Joint) IMPLANT
COMPONET TIB PS G 0D LT (Joint) IMPLANT
CONT SPEC 4OZ STRL OR WHT (MISCELLANEOUS) ×1
COVER SURGICAL LIGHT HANDLE (MISCELLANEOUS) ×2 IMPLANT
CUFF TOURN SGL QUICK 34 (TOURNIQUET CUFF) ×1
CUFF TRNQT CYL 34X4.125X (TOURNIQUET CUFF) ×1 IMPLANT
DRAPE INCISE IOBAN 66X45 STRL (DRAPES) ×1 IMPLANT
DRAPE ORTHO SPLIT 77X108 STRL (DRAPES) ×3
DRAPE SURG ORHT 6 SPLT 77X108 (DRAPES) ×3 IMPLANT
DRAPE U-SHAPE 47X51 STRL (DRAPES) ×2 IMPLANT
DRESSING AQUACEL AG SP 3.5X10 (GAUZE/BANDAGES/DRESSINGS) IMPLANT
DRSG AQUACEL AG ADV 3.5X14 (GAUZE/BANDAGES/DRESSINGS) ×1 IMPLANT
DRSG AQUACEL AG SP 3.5X10 (GAUZE/BANDAGES/DRESSINGS) ×2
DURAPREP 26ML APPLICATOR (WOUND CARE) ×4 IMPLANT
ELECT CAUTERY BLADE 6.4 (BLADE) ×2 IMPLANT
ELECT REM PT RETURN 9FT ADLT (ELECTROSURGICAL) ×2
ELECTRODE REM PT RTRN 9FT ADLT (ELECTROSURGICAL) ×1 IMPLANT
GAUZE SPONGE 4X4 12PLY STRL (GAUZE/BANDAGES/DRESSINGS) ×2 IMPLANT
GAUZE SPONGE 4X4 12PLY STRL LF (GAUZE/BANDAGES/DRESSINGS) ×1 IMPLANT
GLOVE BIOGEL PI IND STRL 7.0 (GLOVE) ×1 IMPLANT
GLOVE BIOGEL PI IND STRL 8 (GLOVE) ×1 IMPLANT
GLOVE BIOGEL PI INDICATOR 7.0 (GLOVE) ×3
GLOVE BIOGEL PI INDICATOR 8 (GLOVE) ×1
GLOVE ECLIPSE 7.0 STRL STRAW (GLOVE) ×2 IMPLANT
GLOVE ECLIPSE 8.0 STRL XLNG CF (GLOVE) ×4 IMPLANT
GLOVE SURG ENC MOIS LTX SZ6.5 (GLOVE) ×6 IMPLANT
GOWN STRL REUS W/ TWL LRG LVL3 (GOWN DISPOSABLE) ×3 IMPLANT
GOWN STRL REUS W/TWL LRG LVL3 (GOWN DISPOSABLE) ×3
HANDPIECE INTERPULSE COAX TIP (DISPOSABLE) ×1
HDLS TROCR DRIL PIN KNEE 75 (PIN) ×1
HOOD PEEL AWAY FLYTE STAYCOOL (MISCELLANEOUS) ×6 IMPLANT
IMMOBILIZER KNEE 20 (SOFTGOODS)
IMMOBILIZER KNEE 20 THIGH 36 (SOFTGOODS) IMPLANT
IMMOBILIZER KNEE 22 UNIV (SOFTGOODS) ×1 IMPLANT
IMMOBILIZER KNEE 24 THIGH 36 (MISCELLANEOUS) IMPLANT
IMMOBILIZER KNEE 24 UNIV (MISCELLANEOUS)
INSERT FIXED AS PERS SZ8-11 LT (Insert) ×1 IMPLANT
KIT BASIN OR (CUSTOM PROCEDURE TRAY) ×2 IMPLANT
KIT TURNOVER KIT B (KITS) ×2 IMPLANT
MANIFOLD NEPTUNE II (INSTRUMENTS) ×2 IMPLANT
NDL SPNL 18GX3.5 QUINCKE PK (NEEDLE) ×1 IMPLANT
NEEDLE 22X1 1/2 (OR ONLY) (NEEDLE) ×4 IMPLANT
NEEDLE SPNL 18GX3.5 QUINCKE PK (NEEDLE) ×2 IMPLANT
NS IRRIG 1000ML POUR BTL (IV SOLUTION) ×4 IMPLANT
PACK TOTAL JOINT (CUSTOM PROCEDURE TRAY) ×2 IMPLANT
PAD ARMBOARD 7.5X6 YLW CONV (MISCELLANEOUS) ×4 IMPLANT
PAD CAST 4YDX4 CTTN HI CHSV (CAST SUPPLIES) ×1 IMPLANT
PADDING CAST COTTON 4X4 STRL (CAST SUPPLIES) ×1
PADDING CAST COTTON 6X4 STRL (CAST SUPPLIES) ×2 IMPLANT
PIN DRILL HDLS TROCAR 75 4PK (PIN) IMPLANT
SCREW FEMALE HEX FIX 25X2.5 (ORTHOPEDIC DISPOSABLE SUPPLIES) ×1 IMPLANT
SET HNDPC FAN SPRY TIP SCT (DISPOSABLE) ×1 IMPLANT
SPIKE FLUID TRANSFER (MISCELLANEOUS) ×2 IMPLANT
STEM POLY PAT PLY 35M KNEE (Knees) ×1 IMPLANT
STRIP CLOSURE SKIN 1/2X4 (GAUZE/BANDAGES/DRESSINGS) ×4 IMPLANT
STRIP CLOSURE SKIN 1/4X3 (GAUZE/BANDAGES/DRESSINGS) ×1 IMPLANT
SUCTION FRAZIER HANDLE 10FR (MISCELLANEOUS) ×1
SUCTION TUBE FRAZIER 10FR DISP (MISCELLANEOUS) ×1 IMPLANT
SUT MNCRL AB 3-0 PS2 18 (SUTURE) ×2 IMPLANT
SUT VIC AB 0 CT1 27 (SUTURE) ×3
SUT VIC AB 0 CT1 27XBRD ANBCTR (SUTURE) ×3 IMPLANT
SUT VIC AB 1 CT1 36 (SUTURE) ×10 IMPLANT
SUT VIC AB 2-0 CT1 27 (SUTURE) ×4
SUT VIC AB 2-0 CT1 TAPERPNT 27 (SUTURE) ×4 IMPLANT
SYR 30ML LL (SYRINGE) ×6 IMPLANT
SYR TB 1ML LUER SLIP (SYRINGE) ×2 IMPLANT
TOWEL GREEN STERILE (TOWEL DISPOSABLE) ×4 IMPLANT
TOWEL GREEN STERILE FF (TOWEL DISPOSABLE) ×4 IMPLANT
TRAY CATH 16FR W/PLASTIC CATH (SET/KITS/TRAYS/PACK) ×1 IMPLANT
WATER STERILE IRR 1000ML POUR (IV SOLUTION) ×1 IMPLANT
YANKAUER SUCT BULB TIP NO VENT (SUCTIONS) ×2 IMPLANT

## 2021-12-31 NOTE — Progress Notes (Signed)
C/o of fullness and pressure.  Pt has not voided since surgery this am.  History of enlarge prostate.  Bladder scan shows  363m. Contacted Dr DMarlou Savia chat.

## 2021-12-31 NOTE — Op Note (Signed)
NAME: ZEVION, SONG MEDICAL RECORD NO: [id number] ACCOUNT NO: [id number] DATE OF BIRTH: 1940/10/19 FACILITY: [facility] LOCATION: [facility] PHYSICIAN: Graylin Shiver. August Saucer, MD  Operative Report   DATE OF PROCEDURE: 12/31/2021   PREOPERATIVE DIAGNOSIS:  Left knee arthritis.  POSTOPERATIVE DIAGNOSIS:  Left knee arthritis.  PROCEDURE:  Left total knee replacement.  SURGEON ATTENDING:  Graylin Shiver. August Saucer, MD  ASSISTANT:  Karenann Cai, PA.  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  IMPLANTS UTILIZED: Persona total knee replacement, posterior cruciate retaining size 9 femur, cemented left, size G tibia press-fit 35 mm all poly patella cemented, 12 mm medial congruent polyethylene insert.  PROCEDURE: The patient was brought to the operating room where spinal anesthetic was induced.  Preoperative antibiotics administered.  Timeout was called.  Left knee was pre-scrubbed with alcohol, Betadine, allowed to air dry, prepped with DuraPrep  solution and draped in sterile manner.  Collier Flowers was used to cover the operative field.  Timeout was called.  Leg was elevated, exsanguinated with the Esmarch wrap.  Tourniquet was inflated.  Anterior approach to the knee was made.  IrriSept solution  utilized. Median parapatellar arthrotomy was made and marked with #1 Vicryl suture, IrriSept solution again utilized.  Fat pad partially excised.  Medial soft tissue dissection performed proportional to the amount of his preoperative varus deformity.   Lateral patellofemoral ligament released.  Soft tissue removed anterior distal femur.  Patella was everted.  Anterior horn lateral meniscus removed.  ACL released.  Osteophytes were removed femur and tibia.  Intramedullary alignment, then used to make a  cut 2 mm off the most affected medial tibial plateau with the collaterals and  posterior neurovascular structures protected.  A 7 mm slope was utilized.  Bone quality is excellent on the tibia.  On the femoral side intramedullary alignment was used to cut  the femur in 5 degrees valgus.  Initially 8 mm cut, then 10 mm cut was utilized.  Bone quality excellent on the femur.  Femur sized to a size 9.  Anterior, posterior chamfer cuts were made.  A tibial tray was placed into position in the appropriate  rotation.  The femoral cut was made in approximately 3 degrees of external rotation to give symmetric flexion gap.  When the anterior, posterior chamfer cuts were made, the collateral ligaments were protected.  Trial components placed.  A 10 and 12 mm  spacer was placed.  A 10 mm spacer gave a little bit of hyperextension, 12 mm spacer gave full extension, excellent flexion and good stability to varus and valgus stress at 0, 30 and 90 degrees. Patella then cut down from 25 to 15.  Patellar bone quality  was good, but not adequate for press-fit patella.  The patellar button was placed and with trial components in position, the patient had full extension, full flexion, excellent patellar tracking using no thumbs technique.  Final preparation made on both  the tibia and the femur.  Trial components were removed.  Three liters of pulsatile irrigation utilized.  The capsule was anesthetized using a combination of Marcaine, Exparel and saline.  TXA sponge, irricept  solution allowed to sit in the incision for 3  minutes.  These were removed.  Vancomycin powder placed into the tibial canal.  Tibia was placed with very good press fit obtained.  Bone quality was excellent and we did achieve a very nice peripheral  rim fit of the size G on the tibia.  Initially  press-fit femur was placed, but with the trial component in place, there was some liftoff and we cemented the femur instead.  Also, cemented the patella in place, allowed cement to harden, excess cement was removed.  Following this, trial  component on  the spacer was removed, all excess cement was removed.  Thorough irrigation was performed.  True spacer placed with same stability parameters maintained including full extension, full flexion, no liftoff, excellent patellar tracking and good stability to  varus and valgus stress at 0, 30, and 90 degrees, tourniquet released at this time.  Thorough irrigation performed with pouring irrigation.  Bleeding points encountered controlled using electrocautery.  A median parapatellar arthrotomy was then closed  over a bolster using #1 Vicryl suture.  Prior to final closure thorough irrigation was performed with IrriSept solution and then vancomycin powder placed.  Arthrotomy was then closed using #1 Vicryl suture completely.  Thorough irrigation again utilized  with vancomycin powder and IrriSept solution.  Skin closure was obtained using 0 Vicryl suture, 2-0 Vicryl suture, and 3-0 Monocryl with Steri-Strips and Aquacel dressing, Ace wrap, iceman and knee immobilizer placed.  It should also be noted that after  arthrotomy closure, the knee was injected with solution of Marcaine, morphine and clonidine for postoperative pain relief.  The patient tolerated the procedure well without immediate complication.  Luke's assistance was required at all times during the  case for retraction, opening, closing, drilling, sawing, placement of the implants, removal of cement.  His assistance was required at all times during the case.     [email or web address] D: 12/31/2021 10:43:41 am T: 12/31/2021 11:01:00 am  JOB: [id number]/ [id number]

## 2021-12-31 NOTE — Anesthesia Procedure Notes (Signed)
Anesthesia Regional Block: Adductor canal block   Pre-Anesthetic Checklist: , timeout performed,  Correct Patient, Correct Site, Correct Laterality,  Correct Procedure, Correct Position, site marked,  Risks and benefits discussed,  Surgical consent,  Pre-op evaluation,  At surgeon's request and post-op pain management  Laterality: Left and Lower  Prep: chloraprep       Needles:  Injection technique: Single-shot  Needle Type: Echogenic Needle     Needle Length: 9cm  Needle Gauge: 21     Additional Needles:   Procedures:,,,, ultrasound used (permanent image in chart),,    Narrative:  Start time: 12/31/2021 7:02 AM End time: 12/31/2021 7:08 AM Injection made incrementally with aspirations every 5 mL.  Performed by: Personally  Anesthesiologist: Annye Asa, MD  Additional Notes: Pt identified in Holding room.  Monitors applied. Working IV access confirmed. Sterile prep L thigh.  #21ga ECHOgenic Arrow block needle into adductor canal with US guidance.  20cc 0.75% Ropivacaine injected incrementally after negative test dose.  Patient asymptomatic, VSS, no heme aspirated, tolerated well.   Jenita Seashore, MD

## 2021-12-31 NOTE — Transfer of Care (Signed)
Immediate Anesthesia Transfer of Care Note  Patient: Keith [name].  Procedure(s) Performed: TOTAL KNEE ARTHROPLASTY (Left: Knee)  Patient Location: PACU  Anesthesia Type:MAC combined with regional for post-op pain  Level of Consciousness: awake  Airway & Oxygen Therapy: Patient Spontanous Breathing  Post-op Assessment: Report given to RN and Post -op Vital signs reviewed and stable  Post vital signs: Reviewed and stable  Last Vitals:  Vitals Value Taken Time  BP 100/62 12/31/21 1028  Temp    Pulse 65 12/31/21 1029  Resp 14 12/31/21 1029  SpO2 92 % 12/31/21 1029  Vitals shown include unvalidated device data.  Last Pain:  Vitals:   12/31/21 0557  PainSc: 0-No pain      Patients Stated Pain Goal: 3 (43/15/40 0867)  Complications: No notable events documented.

## 2021-12-31 NOTE — Progress Notes (Signed)
Patient is starting to have that full bladder feeling again.  He stated he has a hx of enlarged prostate.  Patient is requesting a foley for tonight.  Will contact on call MD.  2222 Tried warm compression on lower abdomen, got patient up out of bed, and tried running water.  Patient has a small trickle of urine.  He is very uncomfortable.  Received order from on call PA to place a foley.

## 2021-12-31 NOTE — Anesthesia Postprocedure Evaluation (Signed)
Anesthesia Post Note  Patient: Keith [name].  Procedure(s) Performed: TOTAL KNEE ARTHROPLASTY (Left: Knee)     Patient location during evaluation: PACU Anesthesia Type: Spinal Level of consciousness: awake and alert, patient cooperative and oriented Pain management: pain level controlled Vital Signs Assessment: post-procedure vital signs reviewed and stable Respiratory status: nonlabored ventilation, spontaneous breathing and respiratory function stable Cardiovascular status: blood pressure returned to baseline and stable Postop Assessment: no apparent nausea or vomiting, patient able to bend at knees, spinal receding and adequate PO intake Anesthetic complications: no   No notable events documented.  Last Vitals:  Vitals:   12/31/21 1110 12/31/21 1125  BP: 116/68 113/63  Pulse: (!) 51 (!) 58  Resp: 14 16  Temp:  (!) 36.1 C  SpO2: 92% 96%    Last Pain:  Vitals:   12/31/21 1125  PainSc: 6                  Keith [name],Keith [name]

## 2021-12-31 NOTE — Telephone Encounter (Signed)
Pt's wife asking for pt appt to be later in the day but no open available appt. She is asking for a call back at [phone]

## 2021-12-31 NOTE — Brief Op Note (Signed)
   12/31/2021  10:37 AM  PATIENT:  [name].  81 y.o. male  PRE-OPERATIVE DIAGNOSIS:  left knee osteoarthritis  POST-OPERATIVE DIAGNOSIS:  left knee osteoarthritis  PROCEDURE:  Procedure(s): TOTAL KNEE ARTHROPLASTY  SURGEON:  Surgeon(s): Meredith Pel, MD  ASSISTANT: Annie Main, PA  ANESTHESIA:   spinal  EBL: 25 ml    Total I/O In: 1400 [I.V.:1400] Out: 10 [Blood:10]  BLOOD ADMINISTERED: none  DRAINS: none   LOCAL MEDICATIONS USED: Marcaine morphine clonidine Exparel vancomycin powder  SPECIMEN:  No Specimen  COUNTS:  YES  TOURNIQUET:   Total Tourniquet Time Documented: Thigh (Left) - 84 minutes Total: Thigh (Left) - 84 minutes   DICTATION: . [id number] PLAN OF CARE: Admit for overnight observation  PATIENT DISPOSITION:  PACU - hemodynamically stable

## 2021-12-31 NOTE — Plan of Care (Signed)

## 2021-12-31 NOTE — Progress Notes (Signed)
Orthopedic Tech Progress Note Patient Details:  Keith [name]. 10-Dec-1940 [id number]  CPM applied with 10-40 ROM. Bone foam at bedside.  CPM Left Knee CPM Left Knee: On Left Knee Flexion (Degrees): 40 Left Knee Extension (Degrees): 10  Post Interventions Instructions Provided: Care of device, Adjustment of device Ortho Devices Type of Ortho Device: Bone foam zero knee, CPM padding Ortho Device/Splint Location: at bedside, for LLE Ortho Device/Splint Interventions: Ordered   Post Interventions Instructions Provided: Care of device, Adjustment of device  Keith [name] 12/31/2021, 11:28 AM

## 2021-12-31 NOTE — Progress Notes (Addendum)
  Transition of Care [facility]) Screening Note   Patient Details  Name: Keith [name]. Date of Birth: 1940/07/17   Transition of Care [facility]) CM/SW Contact:    Sharin Mons, RN Phone Number: [phone] 12/31/2021, 4:29 PM       - s/p L TKA, 6/22 From home. PTA independent with ADL's. Transition of Care Department [facility]) has reviewed patient. Pt setup with [facility] pre- surgery. No DME  needs noted . We will continue to monitor patient advancement through interdisciplinary progression rounds. If new patient transition needs arise, please place a TOC consult.

## 2021-12-31 NOTE — Progress Notes (Signed)
I&O cath per protocol for retention >398m.  100 ml of clear yellow urine.  Pt tolerated well. And states full relief.

## 2021-12-31 NOTE — Evaluation (Signed)
Physical Therapy Evaluation Patient Details Name: Keith [name]. MRN: [id number] DOB: [birth year] Today's Date: 12/31/2021  History of Present Illness  Pt is an 81 y/o male s/p L TKA. PMH includes a fib, CVA, and skin cancer, L THA, and back surgery.  Clinical Impression  Pt admitted secondary to problem above with deficits below. Pt requiring min to min guard A to stand and take side steps at EOB. Pt with increased dizziness and wooziness so further mobility deferred. Educated about knee precautions. Anticipate pt will progress well once symptoms improve. Will continue to follow acutely.        Recommendations for follow up therapy are one component of a multi-disciplinary discharge planning process, led by the attending physician.  Recommendations may be updated based on patient status, additional functional criteria and insurance authorization.  Follow Up Recommendations Follow physician's recommendations for discharge plan and follow up therapies      Assistance Recommended at Discharge Intermittent Supervision/Assistance  Patient can return home with the following  Help with stairs or ramp for entrance;Assist for transportation;Assistance with cooking/housework    Equipment Recommendations None recommended by PT  Recommendations for Other Services       Functional Status Assessment Patient has had a recent decline in their functional status and demonstrates the ability to make significant improvements in function in a reasonable and predictable amount of time.     Precautions / Restrictions Precautions Precautions: Fall;Knee Precaution Booklet Issued: No Precaution Comments: verbally reviewed knee precautions Restrictions Weight Bearing Restrictions: Yes LLE Weight Bearing: Weight bearing as tolerated      Mobility  Bed Mobility Overal bed mobility: Needs Assistance Bed Mobility: Supine to Sit, Sit to Supine     Supine to sit: Min assist Sit to supine: Min  assist   General bed mobility comments: assist for LLE. Increased time required.    Transfers Overall transfer level: Needs assistance Equipment used: Rolling walker (2 wheels) Transfers: Sit to/from Stand Sit to Stand: Min assist           General transfer comment: Min A for steadying assist from elevated bed height. Pt able to take side steps at EOB, but reporting increased dizziness and wooziness, so further mobility deferred.    Ambulation/Gait                  Stairs            Wheelchair Mobility    Modified Rankin (Stroke Patients Only)       Balance Overall balance assessment: Needs assistance Sitting-balance support: No upper extremity supported, Feet supported Sitting balance-Leahy Scale: Fair     Standing balance support: Bilateral upper extremity supported Standing balance-Leahy Scale: Poor Standing balance comment: reliant on BUE support                             Pertinent Vitals/Pain Pain Assessment Pain Assessment: Faces Faces Pain Scale: Hurts even more Pain Location: L knee Pain Descriptors / Indicators: Grimacing, Guarding Pain Intervention(s): Limited activity within patient's tolerance, Monitored during session, Repositioned    Home Living Family/patient expects to be discharged to:: Private residence Living Arrangements: Spouse/significant other Available Help at Discharge: Family Type of Home: House Home Access: Stairs to enter Entrance Stairs-Rails: Psychiatric nurse of Steps: 4   Home Layout: Two level;Able to live on main level with bedroom/bathroom Home Equipment: Rolling Walker (2 wheels);Cane - single point;BSC/3in1      Prior  Function Prior Level of Function : Independent/Modified Independent                     Hand Dominance        Extremity/Trunk Assessment   Upper Extremity Assessment Upper Extremity Assessment: Overall WFL for tasks assessed    Lower Extremity  Assessment Lower Extremity Assessment: LLE deficits/detail LLE Deficits / Details: Consistent with post op pain and weakness. Reports some numbness at knee    Cervical / Trunk Assessment Cervical / Trunk Assessment: Normal  Communication   Communication: No difficulties  Cognition Arousal/Alertness: Awake/alert Behavior During Therapy: WFL for tasks assessed/performed Overall Cognitive Status: Within Functional Limits for tasks assessed                                          General Comments      Exercises     Assessment/Plan    PT Assessment Patient needs continued PT services  PT Problem List Decreased strength;Decreased range of motion;Decreased activity tolerance;Decreased balance;Decreased mobility;Decreased coordination;Impaired sensation;Pain       PT Treatment Interventions DME instruction;Gait training;Stair training;Functional mobility training;Therapeutic activities;Therapeutic exercise;Balance training;Patient/family education    PT Goals (Current goals can be found in the Care Plan section)  Acute Rehab PT Goals Patient Stated Goal: to go home PT Goal Formulation: With patient Time For Goal Achievement: 01/14/22 Potential to Achieve Goals: Good    Frequency 7X/week     Co-evaluation               AM-PAC PT "6 Clicks" Mobility  Outcome Measure Help needed turning from your back to your side while in a flat bed without using bedrails?: None Help needed moving from lying on your back to sitting on the side of a flat bed without using bedrails?: A Little Help needed moving to and from a bed to a chair (including a wheelchair)?: A Little Help needed standing up from a chair using your arms (e.g., wheelchair or bedside chair)?: A Little Help needed to walk in hospital room?: A Little Help needed climbing 3-5 steps with a railing? : A Lot 6 Click Score: 18    End of Session Equipment Utilized During Treatment: Gait belt Activity  Tolerance: Treatment limited secondary to medical complications (Comment) (wooziness) Patient left: in bed;with call bell/phone within reach;with family/visitor present Nurse Communication: Mobility status PT Visit Diagnosis: Unsteadiness on feet (R26.81);Muscle weakness (generalized) (M62.81);Pain;Difficulty in walking, not elsewhere classified (R26.2) Pain - Right/Left: Left Pain - part of body: Knee    Time: 3016-0109 PT Time Calculation (min) (ACUTE ONLY): 23 min   Charges:   PT Evaluation $PT Eval Low Complexity: 1 Low PT Treatments $Therapeutic Activity: 8-22 mins        Lou Miner, DPT  Acute Rehabilitation Services  Office: [phone]   Rudean Hitt 12/31/2021, 3:40 PM

## 2021-12-31 NOTE — H&P (Signed)
TOTAL KNEE ADMISSION H&P  Patient is being admitted for left total knee arthroplasty.  Subjective:  Chief Complaint:left knee pain.  HPI: Keith [name]., 81 y.o. male, has a history of pain and functional disability in the left knee due to arthritis and has failed non-surgical conservative treatments for greater than 12 weeks to includeNSAID's and/or analgesics.  And cortisone and gel injections and activity modification and home exercise program.  Onset of symptoms was gradual, starting >10 years ago with gradually worsening course since that time. The patient noted no past surgery on the left knee(s).  Patient currently rates pain in the left knee(s) at 8 out of 10 with activity. Patient has night pain, worsening of pain with activity and weight bearing, pain that interferes with activities of daily living, pain with passive range of motion, crepitus, and joint swelling.  Patient has evidence of subchondral sclerosis and joint space narrowing by imaging studies. This patient has had  extensive past medical history which is well-documented in the patient's preoperative anesthesia evaluation.  Currently his cardiologist in [place] rates him as low risk.  He has held his Eliquis per cardiology instructions. . There is no active infection.  Patient Active Problem List   Diagnosis Date Noted   Arthritis of left shoulder region    S/P reverse total shoulder arthroplasty, left 09/01/2021   Status post left hip replacement 12/19/2020   Primary osteoarthritis of left hip 12/04/2020   Hyperlipidemia 04/30/2020   Cataract 10/10/2019   Right ureteral stone 10/01/2019   Left ureteral stone 10/01/2019   S/P lumbar fusion 06/15/2019   Acquired hypothyroidism 08/10/2017   Benign prostatic hyperplasia with nocturia 08/10/2017   S/P lumbar laminectomy 06/01/2017   PAF (paroxysmal atrial fibrillation) ([place]) 09/07/2016   S/P AVR 01/18/2014   Hypertension 02/22/2013   Past Medical History:  Diagnosis  Date   Arthritis    Atrial fibrillation ([place])    Cardioversion 2016   Atrial flutter ([place])    Cardioversion 2013   Blood transfusion without reported diagnosis    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   Dysrhythmia    GERD (gastroesophageal reflux disease)    History of aortic valve replacement with bioprosthetic valve    Aortic regurgitation   History of chicken pox    History of kidney stones    History of skin cancer    History of stroke 2011   Hypothyroidism    Kidney stone    Seasonal allergies    Stroke [facility])    after heart surgery 2010    Past Surgical History:  Procedure Laterality Date   AORTIC VALVE REPLACEMENT  2009   Bioprosthetic AVR and root replacement   BACK SURGERY     total of 4 back surgeries   CATARACT EXTRACTION, BILATERAL     COLONOSCOPY     CYSTOSCOPY WITH RETROGRADE PYELOGRAM, URETEROSCOPY AND STENT PLACEMENT Bilateral 10/01/2019   Procedure: CYSTOSCOPY WITH RETROGRADE PYELOGRAM, URETEROSCOPY AND STENT PLACEMENT;  Surgeon: Irine Seal, MD;  Location: [facility];  Service: Urology;  Laterality: Bilateral;   EPIGASTRIC HERNIA REPAIR     HERNIA REPAIR     LUMBAR LAMINECTOMY/DECOMPRESSION MICRODISCECTOMY Right 06/01/2017   Procedure: EXTRAFORAMINAL MICRODISCECTOMY LUMBAR FIVE- SACRAL ONE, RIGHT;  Surgeon: Eustace Moore, MD;  Location: [address];  Service: Neurosurgery;  Laterality: Right;   LUMBAR LAMINECTOMY/DECOMPRESSION MICRODISCECTOMY Bilateral 09/04/2018   Procedure: Laminectomy and Foraminotomy - Lumbar two-three bilateral;  Surgeon: Eustace Moore, MD;  Location: [place];  Service:  Neurosurgery;  Laterality: Bilateral;   REVERSE SHOULDER ARTHROPLASTY Left 09/01/2021   Procedure: LEFT REVERSE SHOULDER ARTHROPLASTY;  Surgeon: Meredith Pel, MD;  Location: [place];  Service: Orthopedics;  Laterality: Left;   TOTAL HIP ARTHROPLASTY Left 12/19/2020   Procedure: LEFT TOTAL HIP ARTHROPLASTY ANTERIOR APPROACH;  Surgeon: Mcarthur Rossetti, MD;  [facility];  Service: Orthopedics;  Laterality: Left;   TRANSFORAMINAL LUMBAR INTERBODY FUSION (TLIF) WITH PEDICLE SCREW FIXATION 1 LEVEL Left 06/15/2019   Procedure: Decompression and Instrumention Fusion Lumbar Four- Five;  Surgeon: Eustace Moore, MD;  Location: [place];  Service: Neurosurgery;  Laterality: Left;  Decompression and Instrumention Fusion Lumbar Four- Five    Current Facility-Administered Medications  Medication Dose Route Frequency Provider Last Rate Last Admin   ceFAZolin (ANCEF) 2-4 GM/100ML-% IVPB            ceFAZolin (ANCEF) IVPB 2g/100 mL premix  2 g Intravenous On Call to OR Magnant, Gerrianne Scale, PA-C       lactated ringers infusion   Intravenous Continuous Myrtie Soman, MD       povidone-iodine (BETADINE) 7.5 % scrub   Topical Once Magnant, Charles L, PA-C       povidone-iodine 10 % swab   Topical Once Meredith Pel, MD       povidone-iodine 10 % swab   Topical Once Meredith Pel, MD       tranexamic acid (CYKLOKAPRON) '1000MG'$ /165m IVPB            tranexamic acid (CYKLOKAPRON) 2,000 mg in sodium chloride 0.9 % 50 mL Topical Application  29,470mg Topical To OR DMeredith Pel MD       tranexamic acid (CYKLOKAPRON) IVPB 1,000 mg  1,000 mg Intravenous To OR Magnant, Charles L, PA-C       Allergies  Allergen Reactions   Tape Rash    Tears skin     Social History   Tobacco Use   Smoking status: Never   Smokeless tobacco: Never  Substance Use Topics   Alcohol use: Yes    Comment: occasional    Family History  Problem Relation Age of Onset   Colon cancer Neg Hx    Esophageal cancer Neg Hx    Rectal cancer Neg Hx    Stomach cancer Neg Hx      Review of Systems  Musculoskeletal:  Positive for arthralgias.  All other systems reviewed and are negative.   Objective:  Physical Exam Vitals reviewed.  HENT:     Head: Normocephalic.     Nose: Nose normal.     Mouth/Throat:     Mouth: Mucous membranes are moist.  Eyes:     Pupils: Pupils are equal,  round, and reactive to light.  Cardiovascular:     Rate and Rhythm: Normal rate.     Pulses: Normal pulses.  Pulmonary:     Effort: Pulmonary effort is normal.  Abdominal:     General: Abdomen is flat.  Musculoskeletal:     Cervical back: Normal range of motion.  Skin:    General: Skin is warm.     Capillary Refill: Capillary refill takes less than 2 seconds.  Neurological:     Mental Status: He is alert. Mental status is at baseline.  Psychiatric:        Mood and Affect: Mood normal.   Examination of the left knee demonstrates good range of motion from 5degrees to 100 degrees of flexion.  Medial joint line tenderness  is present.  DP pulse 1+ out of 4.  Ankle dorsiflexion intact.  Mild effusion present.  Vital signs in last 24 hours: Temp:  [98.9 F (37.2 C)] 98.9 F (37.2 C) (06/22 0554) Pulse Rate:  [65] 65 (06/22 0554) Resp:  [18] 18 (06/22 0554) BP: (132)/(81) 132/81 (06/22 0554) SpO2:  [98 %] 98 % (06/22 0554) Weight:  [81.2 kg] 81.2 kg (06/22 0554)  Labs:   Estimated body mass index is 24.97 kg/m as calculated from the following:   Height as of this encounter: '5\' 11"'$  (1.803 m).   Weight as of this encounter: 81.2 kg.   Imaging Review Plain radiographs demonstrate severe degenerative joint disease of the left knee(s). The overall alignment ismild varus. The bone quality appears to be good for age and reported activity level.      Assessment/Plan:  End stage arthritis, left knee   The patient history, physical examination, clinical judgment of the provider and imaging studies are consistent with end stage degenerative joint disease of the left knee(s) and total knee arthroplasty is deemed medically necessary. The treatment options including medical management, injection therapy arthroscopy and arthroplasty were discussed at length. The risks and benefits of total knee arthroplasty were presented and reviewed. The risks due to aseptic loosening, infection,  stiffness, patella tracking problems, thromboembolic complications and other imponderables were discussed. The patient acknowledged the explanation, agreed to proceed with the plan and consent was signed. Patient is being admitted for inpatient treatment for surgery, pain control, PT, OT, prophylactic antibiotics, VTE prophylaxis, progressive ambulation and ADL's and discharge planning. The patient is planning to be discharged home with home health services patient understands slightly increased risk associated with elective total joint replacement and the patient is age with his medical history.  He did do well with hip replacement and recent shoulder replacement.  All questions answered.     Patient's anticipated LOS is less than 2 midnights, meeting these requirements: - Younger than 61 - Lives within 1 hour of care - Has a competent adult at home to recover with post-op recover - NO history of  - Chronic pain requiring opiods  - Diabetes  - Coronary Artery Disease  - Heart failure  - Heart attack  - Stroke  - DVT/VTE  - Cardiac arrhythmia  - Respiratory Failure/COPD  - Renal failure  - Anemia  - Advanced Liver disease

## 2021-12-31 NOTE — Anesthesia Procedure Notes (Signed)
Spinal  Patient location during procedure: OR End time: 12/31/2021 7:41 AM Reason for block: surgical anesthesia Staffing Performed: anesthesiologist  Anesthesiologist: Annye Asa, MD Performed by: Annye Asa, MD Authorized by: Annye Asa, MD   Preanesthetic Checklist Completed: patient identified, IV checked, site marked, risks and benefits discussed, surgical consent, monitors and equipment checked, pre-op evaluation and timeout performed Spinal Block Patient position: sitting Prep: DuraPrep and site prepped and draped Patient monitoring: blood pressure, continuous pulse ox, cardiac monitor and heart rate Approach: midline Location: L3-4 Injection technique: single-shot Needle Needle type: Quincke  Needle gauge: 22 G Needle length: 9 cm Assessment Events: CSF return Additional Notes Pt identified in Operating room.  Monitors applied. Working IV access confirmed. Sterile prep, drape lumbar spine.  1% lido local L 3,4.  #24ga Pencan os, #22ga Quincke into clear CSF L 3,4.  13.'5mg'$  0.75% Bupivacaine with dextrose injected with asp CSF beginning and end of injection.  Patient asymptomatic, VSS, no heme aspirated, tolerated well.  Jenita Seashore, MD

## 2022-01-01 DIAGNOSIS — M1712 Unilateral primary osteoarthritis, left knee: Secondary | ICD-10-CM | POA: Diagnosis not present

## 2022-01-01 DIAGNOSIS — Z96612 Presence of left artificial shoulder joint: Secondary | ICD-10-CM | POA: Diagnosis not present

## 2022-01-01 DIAGNOSIS — I10 Essential (primary) hypertension: Secondary | ICD-10-CM | POA: Diagnosis not present

## 2022-01-01 DIAGNOSIS — Z85828 Personal history of other malignant neoplasm of skin: Secondary | ICD-10-CM | POA: Diagnosis not present

## 2022-01-01 DIAGNOSIS — E039 Hypothyroidism, unspecified: Secondary | ICD-10-CM | POA: Diagnosis not present

## 2022-01-01 DIAGNOSIS — Z8673 Personal history of transient ischemic attack (TIA), and cerebral infarction without residual deficits: Secondary | ICD-10-CM | POA: Diagnosis not present

## 2022-01-01 DIAGNOSIS — Z96642 Presence of left artificial hip joint: Secondary | ICD-10-CM | POA: Diagnosis not present

## 2022-01-01 LAB — CBC
HCT: 36.2 % — ABNORMAL LOW (ref 39.0–52.0)
Hemoglobin: 12.4 g/dL — ABNORMAL LOW (ref 13.0–17.0)
MCH: 30.9 pg (ref 26.0–34.0)
MCHC: 34.3 g/dL (ref 30.0–36.0)
MCV: 90.3 fL (ref 80.0–100.0)
Platelets: 133 10*3/uL — ABNORMAL LOW (ref 150–400)
RBC: 4.01 MIL/uL — ABNORMAL LOW (ref 4.22–5.81)
RDW: 13.2 % (ref 11.5–15.5)
WBC: 7.5 10*3/uL (ref 4.0–10.5)
nRBC: 0 % (ref 0.0–0.2)

## 2022-01-01 MED ORDER — FAMOTIDINE 20 MG PO TABS
10.0000 mg | ORAL_TABLET | Freq: Two times a day (BID) | ORAL | Status: DC
  Administered 2022-01-01 – 2022-01-02 (×3): 10 mg via ORAL
  Filled 2022-01-01 (×3): qty 1

## 2022-01-01 NOTE — Progress Notes (Signed)
Pt states he voided a very small amount of urine. Did not save.  Denies any bladder discomfort etc.  Will do bladder scan be for the end of shift to assess.

## 2022-01-01 NOTE — Progress Notes (Signed)
Bladder scan performed.  Pt has 96 ml in his bladder.

## 2022-01-02 ENCOUNTER — Other Ambulatory Visit (HOSPITAL_COMMUNITY): Payer: Self-pay

## 2022-01-02 DIAGNOSIS — E039 Hypothyroidism, unspecified: Secondary | ICD-10-CM | POA: Diagnosis not present

## 2022-01-02 DIAGNOSIS — Z96612 Presence of left artificial shoulder joint: Secondary | ICD-10-CM | POA: Diagnosis not present

## 2022-01-02 DIAGNOSIS — I10 Essential (primary) hypertension: Secondary | ICD-10-CM | POA: Diagnosis not present

## 2022-01-02 DIAGNOSIS — Z85828 Personal history of other malignant neoplasm of skin: Secondary | ICD-10-CM | POA: Diagnosis not present

## 2022-01-02 DIAGNOSIS — M1712 Unilateral primary osteoarthritis, left knee: Secondary | ICD-10-CM | POA: Diagnosis not present

## 2022-01-02 DIAGNOSIS — Z96642 Presence of left artificial hip joint: Secondary | ICD-10-CM | POA: Diagnosis not present

## 2022-01-02 DIAGNOSIS — Z96652 Presence of left artificial knee joint: Secondary | ICD-10-CM | POA: Diagnosis not present

## 2022-01-02 DIAGNOSIS — Z8673 Personal history of transient ischemic attack (TIA), and cerebral infarction without residual deficits: Secondary | ICD-10-CM | POA: Diagnosis not present

## 2022-01-02 LAB — URINE CULTURE: Culture: NO GROWTH

## 2022-01-02 LAB — CBC
HCT: 36.4 % — ABNORMAL LOW (ref 39.0–52.0)
Hemoglobin: 12.9 g/dL — ABNORMAL LOW (ref 13.0–17.0)
MCH: 31.2 pg (ref 26.0–34.0)
MCHC: 35.4 g/dL (ref 30.0–36.0)
MCV: 87.9 fL (ref 80.0–100.0)
Platelets: 159 10*3/uL (ref 150–400)
RBC: 4.14 MIL/uL — ABNORMAL LOW (ref 4.22–5.81)
RDW: 13.1 % (ref 11.5–15.5)
WBC: 11.6 10*3/uL — ABNORMAL HIGH (ref 4.0–10.5)
nRBC: 0 % (ref 0.0–0.2)

## 2022-01-02 MED ORDER — ACETAMINOPHEN 325 MG PO TABS
325.0000 mg | ORAL_TABLET | Freq: Four times a day (QID) | ORAL | 0 refills | Status: DC | PRN
  Filled 2022-01-02: qty 60, 8d supply, fill #0

## 2022-01-02 MED ORDER — OXYCODONE HCL 5 MG PO TABS
5.0000 mg | ORAL_TABLET | ORAL | 0 refills | Status: DC | PRN
  Filled 2022-01-02: qty 30, 3d supply, fill #0

## 2022-01-02 MED ORDER — SULFAMETHOXAZOLE-TRIMETHOPRIM 400-80 MG PO TABS
1.0000 | ORAL_TABLET | Freq: Every day | ORAL | 0 refills | Status: DC
  Filled 2022-01-02: qty 7, 7d supply, fill #0

## 2022-01-02 MED ORDER — DOCUSATE SODIUM 100 MG PO CAPS
100.0000 mg | ORAL_CAPSULE | Freq: Two times a day (BID) | ORAL | 0 refills | Status: DC
  Filled 2022-01-02: qty 10, 5d supply, fill #0

## 2022-01-02 MED ORDER — TAMSULOSIN HCL 0.4 MG PO CAPS
0.4000 mg | ORAL_CAPSULE | Freq: Two times a day (BID) | ORAL | 11 refills | Status: DC
  Filled 2022-01-02: qty 30, 15d supply, fill #0
  Filled 2022-01-14 – 2022-01-27 (×2): qty 180, 90d supply, fill #0
  Filled 2022-02-02 – 2022-02-03 (×2): qty 30, 15d supply, fill #0
  Filled 2022-02-03: qty 60, 30d supply, fill #0
  Filled 2022-02-28: qty 60, 30d supply, fill #1
  Filled 2022-08-04: qty 60, 30d supply, fill #2
  Filled 2022-10-31 – 2022-11-18 (×2): qty 60, 30d supply, fill #3

## 2022-01-02 MED ORDER — METHOCARBAMOL 500 MG PO TABS
500.0000 mg | ORAL_TABLET | Freq: Four times a day (QID) | ORAL | 0 refills | Status: DC | PRN
  Filled 2022-01-02: qty 30, 8d supply, fill #0

## 2022-01-02 NOTE — Progress Notes (Signed)
  Time: 0337 Patient has been struggling this entire shift trying to get relief from constantly feeling like he has to urinate.  Patient has been getting up OOB almost every 15 minutes.  Patient did have around 200 mL total of urination however he still complained of his bladder feeling full and pressure.  We did a bladder scan that only showed 2 mL of urine.  After many efforts of warm compressions, running water, and getting up with hopes of gravity helping, patient expressed his desperate need of getting this pressure to subside.  This nurse did an I/O cath and removed 550 mL of urine at 0330.  Patient is feeling better and is currently resting.  MD, please address this concern for patient.     Time: 0627 Patient was able to get a little bit of sleep and awoke with starting all over again feeling the pressure to urinate.  Got patient to stand at the side of the bed but he was not able to void at this time.

## 2022-01-03 DIAGNOSIS — E039 Hypothyroidism, unspecified: Secondary | ICD-10-CM | POA: Diagnosis not present

## 2022-01-03 DIAGNOSIS — I10 Essential (primary) hypertension: Secondary | ICD-10-CM | POA: Diagnosis not present

## 2022-01-03 DIAGNOSIS — I480 Paroxysmal atrial fibrillation: Secondary | ICD-10-CM | POA: Diagnosis not present

## 2022-01-03 DIAGNOSIS — R351 Nocturia: Secondary | ICD-10-CM | POA: Diagnosis not present

## 2022-01-03 DIAGNOSIS — E785 Hyperlipidemia, unspecified: Secondary | ICD-10-CM | POA: Diagnosis not present

## 2022-01-03 DIAGNOSIS — N401 Enlarged prostate with lower urinary tract symptoms: Secondary | ICD-10-CM | POA: Diagnosis not present

## 2022-01-03 DIAGNOSIS — Z471 Aftercare following joint replacement surgery: Secondary | ICD-10-CM | POA: Diagnosis not present

## 2022-01-03 DIAGNOSIS — K5900 Constipation, unspecified: Secondary | ICD-10-CM | POA: Diagnosis not present

## 2022-01-03 DIAGNOSIS — R338 Other retention of urine: Secondary | ICD-10-CM | POA: Diagnosis not present

## 2022-01-05 DIAGNOSIS — E785 Hyperlipidemia, unspecified: Secondary | ICD-10-CM | POA: Diagnosis not present

## 2022-01-05 DIAGNOSIS — I480 Paroxysmal atrial fibrillation: Secondary | ICD-10-CM | POA: Diagnosis not present

## 2022-01-05 DIAGNOSIS — R351 Nocturia: Secondary | ICD-10-CM | POA: Diagnosis not present

## 2022-01-05 DIAGNOSIS — Z471 Aftercare following joint replacement surgery: Secondary | ICD-10-CM | POA: Diagnosis not present

## 2022-01-05 DIAGNOSIS — I10 Essential (primary) hypertension: Secondary | ICD-10-CM | POA: Diagnosis not present

## 2022-01-05 DIAGNOSIS — R338 Other retention of urine: Secondary | ICD-10-CM | POA: Diagnosis not present

## 2022-01-05 DIAGNOSIS — K5900 Constipation, unspecified: Secondary | ICD-10-CM | POA: Diagnosis not present

## 2022-01-05 DIAGNOSIS — N401 Enlarged prostate with lower urinary tract symptoms: Secondary | ICD-10-CM | POA: Diagnosis not present

## 2022-01-05 DIAGNOSIS — E039 Hypothyroidism, unspecified: Secondary | ICD-10-CM | POA: Diagnosis not present

## 2022-01-06 ENCOUNTER — Other Ambulatory Visit: Payer: Self-pay | Admitting: Orthopedic Surgery

## 2022-01-06 ENCOUNTER — Encounter: Payer: Self-pay | Admitting: Orthopedic Surgery

## 2022-01-06 ENCOUNTER — Other Ambulatory Visit (HOSPITAL_COMMUNITY): Payer: Self-pay

## 2022-01-06 MED ORDER — METHOCARBAMOL 500 MG PO TABS
500.0000 mg | ORAL_TABLET | Freq: Four times a day (QID) | ORAL | 0 refills | Status: DC | PRN
  Filled 2022-01-06: qty 30, 8d supply, fill #0

## 2022-01-06 MED ORDER — OXYCODONE HCL 5 MG PO TABS
5.0000 mg | ORAL_TABLET | ORAL | 0 refills | Status: DC | PRN
  Filled 2022-01-06: qty 30, 5d supply, fill #0

## 2022-01-06 NOTE — Telephone Encounter (Signed)
Ok on fever and do continue foley until 7/6 pls call thx and pls call in 7 more days of bactrim ds 1 po qd thx

## 2022-01-07 ENCOUNTER — Other Ambulatory Visit (HOSPITAL_COMMUNITY): Payer: Self-pay

## 2022-01-07 DIAGNOSIS — I10 Essential (primary) hypertension: Secondary | ICD-10-CM | POA: Diagnosis not present

## 2022-01-07 DIAGNOSIS — I480 Paroxysmal atrial fibrillation: Secondary | ICD-10-CM | POA: Diagnosis not present

## 2022-01-07 DIAGNOSIS — R351 Nocturia: Secondary | ICD-10-CM | POA: Diagnosis not present

## 2022-01-07 DIAGNOSIS — E785 Hyperlipidemia, unspecified: Secondary | ICD-10-CM | POA: Diagnosis not present

## 2022-01-07 DIAGNOSIS — Z471 Aftercare following joint replacement surgery: Secondary | ICD-10-CM | POA: Diagnosis not present

## 2022-01-07 DIAGNOSIS — E039 Hypothyroidism, unspecified: Secondary | ICD-10-CM | POA: Diagnosis not present

## 2022-01-07 DIAGNOSIS — R338 Other retention of urine: Secondary | ICD-10-CM | POA: Diagnosis not present

## 2022-01-07 DIAGNOSIS — K5900 Constipation, unspecified: Secondary | ICD-10-CM | POA: Diagnosis not present

## 2022-01-07 DIAGNOSIS — N401 Enlarged prostate with lower urinary tract symptoms: Secondary | ICD-10-CM | POA: Diagnosis not present

## 2022-01-07 MED ORDER — LEVOTHYROXINE SODIUM 50 MCG PO TABS
ORAL_TABLET | ORAL | 2 refills | Status: DC
  Filled 2022-01-07 – 2022-01-14 (×2): qty 90, 90d supply, fill #0
  Filled 2022-04-16: qty 90, 90d supply, fill #1
  Filled 2022-08-04: qty 90, 90d supply, fill #2

## 2022-01-07 MED ORDER — SULFAMETHOXAZOLE-TRIMETHOPRIM 800-160 MG PO TABS
ORAL_TABLET | ORAL | 0 refills | Status: DC
Start: 2022-01-07 — End: 2023-04-21
  Filled 2022-01-07: qty 7, 7d supply, fill #0

## 2022-01-07 MED ORDER — LISINOPRIL-HYDROCHLOROTHIAZIDE 10-12.5 MG PO TABS
1.0000 | ORAL_TABLET | Freq: Every day | ORAL | 2 refills | Status: DC
  Filled 2022-01-07 – 2022-01-14 (×2): qty 90, 90d supply, fill #0
  Filled 2022-04-16: qty 90, 90d supply, fill #1
  Filled 2022-08-04: qty 90, 90d supply, fill #2

## 2022-01-08 ENCOUNTER — Other Ambulatory Visit (HOSPITAL_COMMUNITY): Payer: Self-pay

## 2022-01-09 DIAGNOSIS — I10 Essential (primary) hypertension: Secondary | ICD-10-CM | POA: Diagnosis not present

## 2022-01-09 DIAGNOSIS — R351 Nocturia: Secondary | ICD-10-CM | POA: Diagnosis not present

## 2022-01-09 DIAGNOSIS — N401 Enlarged prostate with lower urinary tract symptoms: Secondary | ICD-10-CM | POA: Diagnosis not present

## 2022-01-09 DIAGNOSIS — K5900 Constipation, unspecified: Secondary | ICD-10-CM | POA: Diagnosis not present

## 2022-01-09 DIAGNOSIS — E785 Hyperlipidemia, unspecified: Secondary | ICD-10-CM | POA: Diagnosis not present

## 2022-01-09 DIAGNOSIS — E039 Hypothyroidism, unspecified: Secondary | ICD-10-CM | POA: Diagnosis not present

## 2022-01-09 DIAGNOSIS — Z471 Aftercare following joint replacement surgery: Secondary | ICD-10-CM | POA: Diagnosis not present

## 2022-01-09 DIAGNOSIS — I480 Paroxysmal atrial fibrillation: Secondary | ICD-10-CM | POA: Diagnosis not present

## 2022-01-09 DIAGNOSIS — R338 Other retention of urine: Secondary | ICD-10-CM | POA: Diagnosis not present

## 2022-01-11 DIAGNOSIS — Z471 Aftercare following joint replacement surgery: Secondary | ICD-10-CM | POA: Diagnosis not present

## 2022-01-11 DIAGNOSIS — I480 Paroxysmal atrial fibrillation: Secondary | ICD-10-CM | POA: Diagnosis not present

## 2022-01-11 DIAGNOSIS — K5900 Constipation, unspecified: Secondary | ICD-10-CM | POA: Diagnosis not present

## 2022-01-11 DIAGNOSIS — E039 Hypothyroidism, unspecified: Secondary | ICD-10-CM | POA: Diagnosis not present

## 2022-01-11 DIAGNOSIS — I10 Essential (primary) hypertension: Secondary | ICD-10-CM | POA: Diagnosis not present

## 2022-01-11 DIAGNOSIS — E785 Hyperlipidemia, unspecified: Secondary | ICD-10-CM | POA: Diagnosis not present

## 2022-01-11 DIAGNOSIS — N401 Enlarged prostate with lower urinary tract symptoms: Secondary | ICD-10-CM | POA: Diagnosis not present

## 2022-01-11 DIAGNOSIS — R338 Other retention of urine: Secondary | ICD-10-CM | POA: Diagnosis not present

## 2022-01-11 DIAGNOSIS — R351 Nocturia: Secondary | ICD-10-CM | POA: Diagnosis not present

## 2022-01-13 DIAGNOSIS — N401 Enlarged prostate with lower urinary tract symptoms: Secondary | ICD-10-CM | POA: Diagnosis not present

## 2022-01-13 DIAGNOSIS — R338 Other retention of urine: Secondary | ICD-10-CM | POA: Diagnosis not present

## 2022-01-13 DIAGNOSIS — I480 Paroxysmal atrial fibrillation: Secondary | ICD-10-CM | POA: Diagnosis not present

## 2022-01-13 DIAGNOSIS — I10 Essential (primary) hypertension: Secondary | ICD-10-CM | POA: Diagnosis not present

## 2022-01-13 DIAGNOSIS — K5900 Constipation, unspecified: Secondary | ICD-10-CM | POA: Diagnosis not present

## 2022-01-13 DIAGNOSIS — R351 Nocturia: Secondary | ICD-10-CM | POA: Diagnosis not present

## 2022-01-13 DIAGNOSIS — E785 Hyperlipidemia, unspecified: Secondary | ICD-10-CM | POA: Diagnosis not present

## 2022-01-13 DIAGNOSIS — E039 Hypothyroidism, unspecified: Secondary | ICD-10-CM | POA: Diagnosis not present

## 2022-01-13 DIAGNOSIS — Z471 Aftercare following joint replacement surgery: Secondary | ICD-10-CM | POA: Diagnosis not present

## 2022-01-14 ENCOUNTER — Ambulatory Visit (INDEPENDENT_AMBULATORY_CARE_PROVIDER_SITE_OTHER): Payer: 59

## 2022-01-14 ENCOUNTER — Ambulatory Visit (INDEPENDENT_AMBULATORY_CARE_PROVIDER_SITE_OTHER): Payer: 59 | Admitting: Surgical

## 2022-01-14 ENCOUNTER — Encounter: Payer: Self-pay | Admitting: Orthopedic Surgery

## 2022-01-14 ENCOUNTER — Other Ambulatory Visit (HOSPITAL_COMMUNITY): Payer: Self-pay

## 2022-01-14 DIAGNOSIS — R338 Other retention of urine: Secondary | ICD-10-CM | POA: Diagnosis not present

## 2022-01-14 DIAGNOSIS — Z96652 Presence of left artificial knee joint: Secondary | ICD-10-CM

## 2022-01-14 MED ORDER — OXYCODONE HCL 5 MG PO TABS
5.0000 mg | ORAL_TABLET | Freq: Two times a day (BID) | ORAL | 0 refills | Status: DC | PRN
  Filled 2022-01-14: qty 30, 15d supply, fill #0

## 2022-01-14 MED ORDER — METHOCARBAMOL 500 MG PO TABS
500.0000 mg | ORAL_TABLET | Freq: Four times a day (QID) | ORAL | 0 refills | Status: DC | PRN
  Filled 2022-01-14: qty 30, 8d supply, fill #0

## 2022-01-14 NOTE — Progress Notes (Signed)
Post-Op Visit Note   Patient: Keith [name].           Date of Birth: [birth year]           MRN: [id number] Visit Date: 01/14/2022 PCP: Celene Squibb, MD   Assessment & Plan:  Chief Complaint:  Chief Complaint  Patient presents with   Left Knee - Routine Post Op   Visit Diagnoses:  1. Status post left knee replacement     Plan: Keith [name]. is a 81 y.o. male who presents s/p left total knee arthroplasty on 12/31/2021.  Doing well overall.  Using CPM machine up to 90 degrees.  They deny any calf pain, shortness of breath, chest pain, abdominal pain.  Pain is overall controlled and taking Robaxin and oxycodone for pain control.  Taking Eliquis twice daily for DVT prophylaxis.  Ambulating with cane.   On exam, patient has range of motion 0 to 70 degrees.  Incision is healing well without evidence of infection or dehiscence.  2+ DP pulse of the operative extremity.  No calf tenderness, negative Homans' sign.  Able to perform straight leg raise.  Intact ankle dorsiflexion.  Plan is continue with home health physical therapy.  Start [facility] physical therapy in [place] 1-2 times per week for 8 weeks.  Focus mainly on flexion and optimizing his quadricep strength.  Radiographs taken show left total knee prosthesis in good position without complicating features.  He had his Foley removed today and was able to urinate at the office.  Follow-up in 4 weeks for clinical recheck with Dr. Marlou Sa and call with any concerns in the meantime..    Follow-Up Instructions: No follow-ups on file.   Orders:  Orders Placed This Encounter  Procedures   XR Knee 1-2 Views Left   Ambulatory referral to Physical Therapy   No orders of the defined types were placed in this encounter.   Imaging: No results found.  PMFS History: Patient Active Problem List   Diagnosis Date Noted   S/P TKR (total knee replacement), left 12/31/2021   Arthritis of left shoulder region    S/P reverse total shoulder  arthroplasty, left 09/01/2021   Status post left hip replacement 12/19/2020   Primary osteoarthritis of left hip 12/04/2020   Hyperlipidemia 04/30/2020   Cataract 10/10/2019   Right ureteral stone 10/01/2019   Left ureteral stone 10/01/2019   S/P lumbar fusion 06/15/2019   Acquired hypothyroidism 08/10/2017   Benign prostatic hyperplasia with nocturia 08/10/2017   S/P lumbar laminectomy 06/01/2017   PAF (paroxysmal atrial fibrillation) ([place]) 09/07/2016   S/P AVR 01/18/2014   Hypertension 02/22/2013   Past Medical History:  Diagnosis Date   Arthritis    Atrial fibrillation ([place])    Cardioversion 2016   Atrial flutter ([place])    Cardioversion 2013   Blood transfusion without reported diagnosis    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   Dysrhythmia    GERD (gastroesophageal reflux disease)    History of aortic valve replacement with bioprosthetic valve    Aortic regurgitation   History of chicken pox    History of kidney stones    History of skin cancer    History of stroke 2011   Hypothyroidism    Kidney stone    Seasonal allergies    Stroke [facility])    after heart surgery 2010    Family History  Problem Relation Age of Onset   Colon cancer Neg Hx  Esophageal cancer Neg Hx    Rectal cancer Neg Hx    Stomach cancer Neg Hx     Past Surgical History:  Procedure Laterality Date   AORTIC VALVE REPLACEMENT  2009   Bioprosthetic AVR and root replacement   BACK SURGERY     total of 4 back surgeries   CATARACT EXTRACTION, BILATERAL     COLONOSCOPY     CYSTOSCOPY WITH RETROGRADE PYELOGRAM, URETEROSCOPY AND STENT PLACEMENT Bilateral 10/01/2019   Procedure: CYSTOSCOPY WITH RETROGRADE PYELOGRAM, URETEROSCOPY AND STENT PLACEMENT;  Surgeon: Irine Seal, MD;  [facility];  Service: Urology;  Laterality: Bilateral;   EPIGASTRIC HERNIA REPAIR     HERNIA REPAIR     LUMBAR LAMINECTOMY/DECOMPRESSION MICRODISCECTOMY Right 06/01/2017   Procedure: EXTRAFORAMINAL MICRODISCECTOMY  LUMBAR FIVE- SACRAL ONE, RIGHT;  Surgeon: Eustace Moore, MD;  Location: [place];  Service: Neurosurgery;  Laterality: Right;   LUMBAR LAMINECTOMY/DECOMPRESSION MICRODISCECTOMY Bilateral 09/04/2018   Procedure: Laminectomy and Foraminotomy - Lumbar two-three bilateral;  Surgeon: Eustace Moore, MD;  Location: [place];  Service: Neurosurgery;  Laterality: Bilateral;   REVERSE SHOULDER ARTHROPLASTY Left 09/01/2021   Procedure: LEFT REVERSE SHOULDER ARTHROPLASTY;  Surgeon: Meredith Pel, MD;  Location: [place];  Service: Orthopedics;  Laterality: Left;   TOTAL HIP ARTHROPLASTY Left 12/19/2020   Procedure: LEFT TOTAL HIP ARTHROPLASTY ANTERIOR APPROACH;  Surgeon: Mcarthur Rossetti, MD;  [facility];  Service: Orthopedics;  Laterality: Left;   TOTAL KNEE ARTHROPLASTY Left 12/31/2021   Procedure: TOTAL KNEE ARTHROPLASTY;  Surgeon: Meredith Pel, MD;  Location: [place];  Service: Orthopedics;  Laterality: Left;   TRANSFORAMINAL LUMBAR INTERBODY FUSION (TLIF) WITH PEDICLE SCREW FIXATION 1 LEVEL Left 06/15/2019   Procedure: Decompression and Instrumention Fusion Lumbar Four- Five;  Surgeon: Eustace Moore, MD;  Location: [place];  Service: Neurosurgery;  Laterality: Left;  Decompression and Instrumention Fusion Lumbar Four- Five   Social History   Occupational History   Occupation: Chief Strategy Officer  Tobacco Use   Smoking status: Never   Smokeless tobacco: Never  Vaping Use   Vaping Use: Never used  Substance and Sexual Activity   Alcohol use: Yes    Comment: occasional   Drug use: No   Sexual activity: Yes

## 2022-01-14 NOTE — Addendum Note (Signed)
Addended by: Donella Stade on: 01/14/2022 01:12 PM   Modules accepted: Orders

## 2022-01-15 DIAGNOSIS — I480 Paroxysmal atrial fibrillation: Secondary | ICD-10-CM | POA: Diagnosis not present

## 2022-01-15 DIAGNOSIS — E785 Hyperlipidemia, unspecified: Secondary | ICD-10-CM | POA: Diagnosis not present

## 2022-01-15 DIAGNOSIS — E039 Hypothyroidism, unspecified: Secondary | ICD-10-CM | POA: Diagnosis not present

## 2022-01-15 DIAGNOSIS — R351 Nocturia: Secondary | ICD-10-CM | POA: Diagnosis not present

## 2022-01-15 DIAGNOSIS — R338 Other retention of urine: Secondary | ICD-10-CM | POA: Diagnosis not present

## 2022-01-15 DIAGNOSIS — Z471 Aftercare following joint replacement surgery: Secondary | ICD-10-CM | POA: Diagnosis not present

## 2022-01-15 DIAGNOSIS — I10 Essential (primary) hypertension: Secondary | ICD-10-CM | POA: Diagnosis not present

## 2022-01-15 DIAGNOSIS — N401 Enlarged prostate with lower urinary tract symptoms: Secondary | ICD-10-CM | POA: Diagnosis not present

## 2022-01-15 DIAGNOSIS — K5900 Constipation, unspecified: Secondary | ICD-10-CM | POA: Diagnosis not present

## 2022-01-17 DIAGNOSIS — M1712 Unilateral primary osteoarthritis, left knee: Secondary | ICD-10-CM

## 2022-01-19 ENCOUNTER — Ambulatory Visit (HOSPITAL_COMMUNITY): Payer: 59 | Attending: Orthopedic Surgery | Admitting: Physical Therapy

## 2022-01-19 ENCOUNTER — Encounter (HOSPITAL_COMMUNITY): Payer: Self-pay | Admitting: Physical Therapy

## 2022-01-19 DIAGNOSIS — R29898 Other symptoms and signs involving the musculoskeletal system: Secondary | ICD-10-CM | POA: Insufficient documentation

## 2022-01-19 DIAGNOSIS — R2689 Other abnormalities of gait and mobility: Secondary | ICD-10-CM | POA: Diagnosis not present

## 2022-01-19 DIAGNOSIS — M25562 Pain in left knee: Secondary | ICD-10-CM | POA: Diagnosis not present

## 2022-01-19 DIAGNOSIS — M6281 Muscle weakness (generalized): Secondary | ICD-10-CM | POA: Insufficient documentation

## 2022-01-19 DIAGNOSIS — M25662 Stiffness of left knee, not elsewhere classified: Secondary | ICD-10-CM | POA: Insufficient documentation

## 2022-01-19 DIAGNOSIS — Z96652 Presence of left artificial knee joint: Secondary | ICD-10-CM | POA: Insufficient documentation

## 2022-01-19 NOTE — Therapy (Signed)
OUTPATIENT PHYSICAL THERAPY LOWER EXTREMITY EVALUATION   Patient Name: Keith [name]. MRN: [id number] DOB:05/24/1941, 81 y.o., male Today's Date: 01/19/2022   PT End of Session - 01/19/22 0859     Visit Number 1    Number of Visits 12    Date for PT Re-Evaluation 03/02/22    Authorization Type Primary [employer](auth after 25th visit) Secondary: Medicare    Progress Note Due on Visit 10    PT Start Time 0900    PT Stop Time 0938    PT Time Calculation (min) 38 min    Activity Tolerance Patient tolerated treatment well    Behavior During Therapy [facility] for tasks assessed/performed             Past Medical History:  Diagnosis Date   Arthritis    Atrial fibrillation ([place])    Cardioversion 2016   Atrial flutter ([place])    Cardioversion 2013   Blood transfusion without reported diagnosis    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   Dysrhythmia    GERD (gastroesophageal reflux disease)    History of aortic valve replacement with bioprosthetic valve    Aortic regurgitation   History of chicken pox    History of kidney stones    History of skin cancer    History of stroke 2011   Hypothyroidism    Kidney stone    Seasonal allergies    Stroke [facility])    after heart surgery 2010   Past Surgical History:  Procedure Laterality Date   AORTIC VALVE REPLACEMENT  2009   Bioprosthetic AVR and root replacement   BACK SURGERY     total of 4 back surgeries   CATARACT EXTRACTION, BILATERAL     COLONOSCOPY     CYSTOSCOPY WITH RETROGRADE PYELOGRAM, URETEROSCOPY AND STENT PLACEMENT Bilateral 10/01/2019   Procedure: CYSTOSCOPY WITH RETROGRADE PYELOGRAM, URETEROSCOPY AND STENT PLACEMENT;  Surgeon: Irine Seal, MD;  Location: [facility];  Service: Urology;  Laterality: Bilateral;   EPIGASTRIC HERNIA REPAIR     HERNIA REPAIR     LUMBAR LAMINECTOMY/DECOMPRESSION MICRODISCECTOMY Right 06/01/2017   Procedure: EXTRAFORAMINAL MICRODISCECTOMY LUMBAR FIVE- SACRAL ONE, RIGHT;  Surgeon: Eustace Moore, MD;  Location: [place];  Service: Neurosurgery;  Laterality: Right;   LUMBAR LAMINECTOMY/DECOMPRESSION MICRODISCECTOMY Bilateral 09/04/2018   Procedure: Laminectomy and Foraminotomy - Lumbar two-three bilateral;  Surgeon: Eustace Moore, MD;  Location: [place];  Service: Neurosurgery;  Laterality: Bilateral;   REVERSE SHOULDER ARTHROPLASTY Left 09/01/2021   Procedure: LEFT REVERSE SHOULDER ARTHROPLASTY;  Surgeon: Meredith Pel, MD;  Location: [place];  Service: Orthopedics;  Laterality: Left;   TOTAL HIP ARTHROPLASTY Left 12/19/2020   Procedure: LEFT TOTAL HIP ARTHROPLASTY ANTERIOR APPROACH;  Surgeon: Mcarthur Rossetti, MD;  [facility];  Service: Orthopedics;  Laterality: Left;   TOTAL KNEE ARTHROPLASTY Left 12/31/2021   Procedure: TOTAL KNEE ARTHROPLASTY;  Surgeon: Meredith Pel, MD;  Location: [place];  Service: Orthopedics;  Laterality: Left;   TRANSFORAMINAL LUMBAR INTERBODY FUSION (TLIF) WITH PEDICLE SCREW FIXATION 1 LEVEL Left 06/15/2019   Procedure: Decompression and Instrumention Fusion Lumbar Four- Five;  Surgeon: Eustace Moore, MD;  Location: [place];  Service: Neurosurgery;  Laterality: Left;  Decompression and Instrumention Fusion Lumbar Four- Five   Patient Active Problem List   Diagnosis Date Noted   Arthritis of left knee    S/P TKR (total knee replacement), left 12/31/2021   Arthritis of left shoulder region  S/P reverse total shoulder arthroplasty, left 09/01/2021   Status post left hip replacement 12/19/2020   Primary osteoarthritis of left hip 12/04/2020   Hyperlipidemia 04/30/2020   Cataract 10/10/2019   Right ureteral stone 10/01/2019   Left ureteral stone 10/01/2019   S/P lumbar fusion 06/15/2019   Acquired hypothyroidism 08/10/2017   Benign prostatic hyperplasia with nocturia 08/10/2017   S/P lumbar laminectomy 06/01/2017   PAF (paroxysmal atrial fibrillation) ([place]) 09/07/2016   S/P AVR 01/18/2014   Hypertension 02/22/2013    PCP: Celene Squibb MD  REFERRING PROVIDER: Meredith Pel, MD   REFERRING DIAG: [id number] (ICD-10-CM) - Status post left knee replacement   THERAPY DIAG:  Left knee pain, unspecified chronicity  Stiffness of left knee, not elsewhere classified  Muscle weakness (generalized)  Other abnormalities of gait and mobility  Other symptoms and signs involving the musculoskeletal system  Rationale for Evaluation and Treatment Rehabilitation  ONSET DATE: 12/31/21   SUBJECTIVE:   SUBJECTIVE STATEMENT: Patient states knee has been doing good. He uses cane for balance. He had HHPT and is still doing exercises.   PERTINENT HISTORY: S/P left reverse shoulder arthroplasty completed on 09/01/21 by Dr. Marlou Sa., Hx afib, hx CVA, Hx skin cancer, L THA, Hx lumbar surgery  PAIN:  Are you having pain? Yes: NPRS scale: 0/10 current, Worst 4/10 Pain location: L knee  Pain description: sore Aggravating factors: exercise Relieving factors: rest  PRECAUTIONS: None  WEIGHT BEARING RESTRICTIONS No  FALLS:  Has patient fallen in last 6 months? 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   OBJECTIVE:   Observation:  L knee held in extension, Relies on RLE for transfers  PATIENT SURVEYS:  FOTO 65% function  COGNITION:  Overall cognitive status: Within functional limits for tasks assessed     SENSATION: WFL   POSTURE: rounded shoulders and forward head  PALPATION: TTP L quads  LOWER EXTREMITY ROM:  Active ROM Right eval Left eval  Hip flexion    Hip extension    Hip abduction    Hip adduction    Hip internal rotation    Hip external rotation    Knee flexion  62  Knee extension  Lacking 3  Ankle dorsiflexion    Ankle plantarflexion     Ankle inversion    Ankle eversion     (Blank rows = not tested)  LOWER EXTREMITY MMT:  MMT Right eval Left eval  Hip flexion 5 5  Hip extension    Hip abduction    Hip adduction    Hip internal rotation    Hip external rotation    Knee flexion 5 4+  Knee extension 5 4+  Ankle dorsiflexion 5 5  Ankle plantarflexion    Ankle inversion    Ankle eversion     (Blank rows = not tested)   FUNCTIONAL TESTS:  2 minute walk test: 275 feet  GAIT: Distance walked: 275 feet Assistive device utilized: Single point cane Level of assistance: Modified independence Comments: 2MWT, antalgic, limited knee flexion/extension ROM    TODAY'S TREATMENT: 01/19/22 Quad set 5x 10 second holds Heel slides 10 x 10 second holds with strap SLR 2x 10 with quad set Seated heel slides 5x 10 second holds  PATIENT EDUCATION:  Education details: Patient educated on exam findings, POC, scope of PT, HEP, and LE positioning. Person educated: Patient Education method: Explanation, Demonstration, and Handouts Education comprehension: verbalized understanding, returned demonstration, verbal cues required, and tactile cues required    HOME EXERCISE PROGRAM: 7/11/23Access Code: [place] Sitting Quad Set with Towel Roll Under Heel  - 3 x daily - 7 x weekly - 10 reps - 10 second hold - Supine Heel Slide with Strap  - 3-5 x daily - 7 x weekly - 10 reps - 10 second hold - Seated Heel Slide (Mirrored)  - 3-5 x daily - 7 x weekly - 10 reps - 10 second hold - Active Straight Leg Raise with Quad Set (Mirrored)  - 3 x daily - 7 x weekly - 3 sets - 10 reps  ASSESSMENT:  CLINICAL IMPRESSION: Patient a 81 y.o. y.o. male who was seen today for physical therapy evaluation and treatment for s/p L TKA on 12/31/21. Patient presents with pain limited deficits in L knee strength, ROM, endurance, gait, balance, activity tolerance, and functional mobility with ADL. Patient is having to modify and restrict ADL as  indicated by outcome measure score as well as subjective information and objective measures which is affecting overall participation. Patient will benefit from skilled physical therapy in order to improve function and reduce impairment.  OBJECTIVE IMPAIRMENTS Abnormal gait, decreased balance, decreased mobility, difficulty walking, decreased ROM, decreased strength, increased edema, impaired flexibility, improper body mechanics, and pain.   ACTIVITY LIMITATIONS carrying, lifting, bending, standing, squatting, stairs, transfers, locomotion level, and caring for others  PARTICIPATION LIMITATIONS: meal prep, cleaning, laundry, driving, shopping, community activity, and yard work  PERSONAL FACTORS Age, Time since onset of injury/illness/exacerbation, and 3+ comorbidities: Hx afib, hx CVA, Hx skin cancer, L THA, Hx lumbar surgery, hx reverse shoulder replacement  are also affecting patient's functional outcome.   REHAB POTENTIAL: Good  CLINICAL DECISION MAKING: Stable/uncomplicated  EVALUATION COMPLEXITY: Low   GOALS: Goals reviewed with patient? Yes  SHORT TERM GOALS: Target date: 02/09/2022  Patient will be independent with HEP in order to improve functional outcomes. Baseline:  Goal status: INITIAL  2.  Patient will report at least 25% improvement in symptoms for improved quality of life. Baseline:  Goal status: INITIAL    LONG TERM GOALS: Target date: 03/02/2022  Patient will report at least 75% improvement in symptoms for improved quality of life. Baseline:  Goal status: INITIAL  2.  Patient will improve FOTO score by at least 13 points in order to indicate improved tolerance to activity. Baseline: 7/11: 65% function Goal status: INITIAL  3.  Patient will be able to navigate stairs with reciprocal pattern without compensation in order to demonstrate improved LE strength. Baseline:  Goal status: INITIAL  4.  Patient will be able to ambulate at least 400 feet in 2MWT in order  to demonstrate improved tolerance to activity. Baseline: 275 feet Goal status: INITIAL  5.  Patient will improve ROM for left knee extension/flexion to 0-115 degrees to improve squatting, and other functional mobility. Baseline: lacking 3 to 62 Goal status: INITIAL  6.  Patient will be able to complete 5x STS in under 11.4 seconds without compensation in order to demonstrate improved functional strength. Baseline: transfers: relies RLE, left knee held in extension, UE use Goal status: INITIAL    PLAN: PT FREQUENCY: 2x/week  PT DURATION: 6 weeks  PLANNED INTERVENTIONS: Therapeutic exercises, Therapeutic activity, Neuromuscular re-education, Balance training, Gait training, Patient/Family education,  Joint manipulation, Joint mobilization, Stair training, Orthotic/Fit training, DME instructions, Aquatic Therapy, Dry Needling, Electrical stimulation, Spinal manipulation, Spinal mobilization, Cryotherapy, Moist heat, Compression bandaging, scar mobilization, Splintting, Taping, Traction, Ultrasound, Ionotophoresis '4mg'$ /ml Dexamethasone, and Manual therapy  PLAN FOR NEXT SESSION: left knee mobility, quad strength, functional strengthening as able   Mearl Latin, PT 01/19/2022, 9:44 AM

## 2022-01-21 ENCOUNTER — Ambulatory Visit (HOSPITAL_COMMUNITY): Payer: 59 | Admitting: Physical Therapy

## 2022-01-21 ENCOUNTER — Encounter (HOSPITAL_COMMUNITY): Payer: Self-pay | Admitting: Physical Therapy

## 2022-01-21 DIAGNOSIS — M25662 Stiffness of left knee, not elsewhere classified: Secondary | ICD-10-CM

## 2022-01-21 DIAGNOSIS — R29898 Other symptoms and signs involving the musculoskeletal system: Secondary | ICD-10-CM | POA: Diagnosis not present

## 2022-01-21 DIAGNOSIS — R2689 Other abnormalities of gait and mobility: Secondary | ICD-10-CM | POA: Diagnosis not present

## 2022-01-21 DIAGNOSIS — M25562 Pain in left knee: Secondary | ICD-10-CM

## 2022-01-21 DIAGNOSIS — M6281 Muscle weakness (generalized): Secondary | ICD-10-CM

## 2022-01-21 DIAGNOSIS — Z96652 Presence of left artificial knee joint: Secondary | ICD-10-CM | POA: Diagnosis not present

## 2022-01-21 NOTE — Therapy (Signed)
OUTPATIENT PHYSICAL THERAPY TREATMENT   Patient Name: Keith [name]. MRN: [id number] DOB:February 15, 1941, 81 y.o., male Today's Date: 01/21/2022   PT End of Session - 01/21/22 1432     Visit Number 2    Number of Visits 12    Date for PT Re-Evaluation 03/02/22    Authorization Type Primary [employer](auth after 25th visit) Secondary: Medicare    Progress Note Due on Visit 10    PT Start Time 1432    PT Stop Time 1512    PT Time Calculation (min) 40 min    Activity Tolerance Patient tolerated treatment well    Behavior During Therapy [facility] for tasks assessed/performed             Past Medical History:  Diagnosis Date   Arthritis    Atrial fibrillation ([place])    Cardioversion 2016   Atrial flutter ([place])    Cardioversion 2013   Blood transfusion without reported diagnosis    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   Dysrhythmia    GERD (gastroesophageal reflux disease)    History of aortic valve replacement with bioprosthetic valve    Aortic regurgitation   History of chicken pox    History of kidney stones    History of skin cancer    History of stroke 2011   Hypothyroidism    Kidney stone    Seasonal allergies    Stroke [facility])    after heart surgery 2010   Past Surgical History:  Procedure Laterality Date   AORTIC VALVE REPLACEMENT  2009   Bioprosthetic AVR and root replacement   BACK SURGERY     total of 4 back surgeries   CATARACT EXTRACTION, BILATERAL     COLONOSCOPY     CYSTOSCOPY WITH RETROGRADE PYELOGRAM, URETEROSCOPY AND STENT PLACEMENT Bilateral 10/01/2019   Procedure: CYSTOSCOPY WITH RETROGRADE PYELOGRAM, URETEROSCOPY AND STENT PLACEMENT;  Surgeon: Irine Seal, MD;  [facility];  Service: Urology;  Laterality: Bilateral;   EPIGASTRIC HERNIA REPAIR     HERNIA REPAIR     LUMBAR LAMINECTOMY/DECOMPRESSION MICRODISCECTOMY Right 06/01/2017   Procedure: EXTRAFORAMINAL MICRODISCECTOMY LUMBAR FIVE- SACRAL ONE, RIGHT;  Surgeon: Eustace Moore, MD;   Location: [place];  Service: Neurosurgery;  Laterality: Right;   LUMBAR LAMINECTOMY/DECOMPRESSION MICRODISCECTOMY Bilateral 09/04/2018   Procedure: Laminectomy and Foraminotomy - Lumbar two-three bilateral;  Surgeon: Eustace Moore, MD;  Location: [place];  Service: Neurosurgery;  Laterality: Bilateral;   REVERSE SHOULDER ARTHROPLASTY Left 09/01/2021   Procedure: LEFT REVERSE SHOULDER ARTHROPLASTY;  Surgeon: Meredith Pel, MD;  Location: [place];  Service: Orthopedics;  Laterality: Left;   TOTAL HIP ARTHROPLASTY Left 12/19/2020   Procedure: LEFT TOTAL HIP ARTHROPLASTY ANTERIOR APPROACH;  Surgeon: Mcarthur Rossetti, MD;  [facility];  Service: Orthopedics;  Laterality: Left;   TOTAL KNEE ARTHROPLASTY Left 12/31/2021   Procedure: TOTAL KNEE ARTHROPLASTY;  Surgeon: Meredith Pel, MD;  Location: [place];  Service: Orthopedics;  Laterality: Left;   TRANSFORAMINAL LUMBAR INTERBODY FUSION (TLIF) WITH PEDICLE SCREW FIXATION 1 LEVEL Left 06/15/2019   Procedure: Decompression and Instrumention Fusion Lumbar Four- Five;  Surgeon: Eustace Moore, MD;  Location: [place];  Service: Neurosurgery;  Laterality: Left;  Decompression and Instrumention Fusion Lumbar Four- Five   Patient Active Problem List   Diagnosis Date Noted   Arthritis of left knee    S/P TKR (total knee replacement), left 12/31/2021   Arthritis of left shoulder region    S/P reverse  total shoulder arthroplasty, left 09/01/2021   Status post left hip replacement 12/19/2020   Primary osteoarthritis of left hip 12/04/2020   Hyperlipidemia 04/30/2020   Cataract 10/10/2019   Right ureteral stone 10/01/2019   Left ureteral stone 10/01/2019   S/P lumbar fusion 06/15/2019   Acquired hypothyroidism 08/10/2017   Benign prostatic hyperplasia with nocturia 08/10/2017   S/P lumbar laminectomy 06/01/2017   PAF (paroxysmal atrial fibrillation) ([place]) 09/07/2016   S/P AVR 01/18/2014   Hypertension 02/22/2013    PCP: Celene Squibb  MD  REFERRING PROVIDER: Meredith Pel, MD   REFERRING DIAG: [phone] (ICD-10-CM) - Status post left knee replacement   THERAPY DIAG:  Left knee pain, unspecified chronicity  Stiffness of left knee, not elsewhere classified  Muscle weakness (generalized)  Other abnormalities of gait and mobility  Other symptoms and signs involving the musculoskeletal system  Rationale for Evaluation and Treatment Rehabilitation  ONSET DATE: 12/31/21   SUBJECTIVE:   SUBJECTIVE STATEMENT: Patient states his knee is moving better but stiff today. Exercises are going alright.   PERTINENT HISTORY: S/P left reverse shoulder arthroplasty completed on 09/01/21 by Dr. Marlou Sa., Hx afib, hx CVA, Hx skin cancer, L THA, Hx lumbar surgery  PAIN:  Are you having pain? Yes: NPRS scale: 0/10 current, Worst 4/10 Pain location: L knee  Pain description: sore Aggravating factors: exercise Relieving factors: rest  PRECAUTIONS: None  WEIGHT BEARING RESTRICTIONS No  FALLS:  Has patient fallen in last 6 months? 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[place]  PLOF: [place] get back to normal   OBJECTIVE:   Observation:  L knee held in extension, Relies on RLE for transfers  PATIENT SURVEYS:  FOTO 65% function  COGNITION:  Overall cognitive status: Within functional limits for tasks assessed     SENSATION: WFL   POSTURE: rounded shoulders and forward head  PALPATION: TTP L quads  LOWER EXTREMITY ROM:  Active ROM Right eval Left eval Left  01/21/22  Hip flexion     Hip extension     Hip abduction     Hip adduction     Hip internal rotation     Hip external rotation     Knee flexion  62 83 improves to 88 with heel slides and 92 following contract relax  Knee  extension  Lacking 3 Lacking 2  Ankle dorsiflexion     Ankle plantarflexion     Ankle inversion     Ankle eversion      (Blank rows = not tested)  LOWER EXTREMITY MMT:  MMT Right eval Left eval  Hip flexion 5 5  Hip extension    Hip abduction    Hip adduction    Hip internal rotation    Hip external rotation    Knee flexion 5 4+  Knee extension 5 4+  Ankle dorsiflexion 5 5  Ankle plantarflexion    Ankle inversion    Ankle eversion     (Blank rows = not tested)   FUNCTIONAL TESTS:  2 minute walk test: 275 feet  GAIT: Distance walked: 275 feet Assistive device utilized: Single point cane Level of assistance: Modified independence Comments: 2MWT, antalgic, limited knee flexion/extension ROM    TODAY'S TREATMENT: 01/21/22 Bike seat 16 rocking dynamic warm up and ROM Supine heel  slides 10x 10 second holds with strap Supine hamstring sets into green ball 10 x 10 second holds Contract relax for flexion 2x 10 5- 10 second holds in seated HR 1x 15  Squat at counter 1x 10 STS from chair with black foam and UE support 1x 10   01/19/22 Quad set 5x 10 second holds Heel slides 10 x 10 second holds with strap SLR 2x 10 with quad set Seated heel slides 5x 10 second holds   PATIENT EDUCATION:  Education details:01/19/22 Patient educated on exam findings, POC, scope of PT, HEP, and LE positioning. 01/21/22 HEP Person educated: Patient Education method: Consulting civil engineer, Demonstration, and Handouts Education comprehension: verbalized understanding, returned demonstration, verbal cues required, and tactile cues required    HOME EXERCISE PROGRAM: 7/11/23Access Code: [place] Sitting Quad Set with Towel Roll Under Heel  - 3 x daily - 7 x weekly - 10 reps - 10 second hold - Supine Heel Slide with Strap  - 3-5 x daily - 7 x weekly - 10 reps - 10 second hold - Seated Heel Slide (Mirrored)  - 3-5 x daily - 7 x weekly - 10 reps - 10 second hold - Active Straight Leg Raise with  Quad Set (Mirrored)  - 3 x daily - 7 x weekly - 3 sets - 10 reps  ASSESSMENT:  CLINICAL IMPRESSION: Patient begins session on bike for dynamic warm up and ROM. Patient demonstrating improving ROM from lacking 2 to 83 degrees. Improves to 88 degrees with heel slides and 92 following contract relax. He demonstrates good mechanics with squat at counter to work in new range, difficulty with STS from regular chair height due to ROM and strength limitations. Patient will continue to benefit from physical therapy in order to improve function and reduce impairment.   OBJECTIVE IMPAIRMENTS Abnormal gait, decreased balance, decreased mobility, difficulty walking, decreased ROM, decreased strength, increased edema, impaired flexibility, improper body mechanics, and pain.   ACTIVITY LIMITATIONS carrying, lifting, bending, standing, squatting, stairs, transfers, locomotion level, and caring for others  PARTICIPATION LIMITATIONS: meal prep, cleaning, laundry, driving, shopping, community activity, and yard work  PERSONAL FACTORS Age, Time since onset of injury/illness/exacerbation, and 3+ comorbidities: Hx afib, hx CVA, Hx skin cancer, L THA, Hx lumbar surgery, hx reverse shoulder replacement  are also affecting patient's functional outcome.   REHAB POTENTIAL: Good  CLINICAL DECISION MAKING: Stable/uncomplicated  EVALUATION COMPLEXITY: Low   GOALS: Goals reviewed with patient? Yes  SHORT TERM GOALS: Target date: 02/09/2022  Patient will be independent with HEP in order to improve functional outcomes. Baseline:  Goal status: IN PROGRESS  2.  Patient will report at least 25% improvement in symptoms for improved quality of life. Baseline:  Goal status: IN PROGRESS    LONG TERM GOALS: Target date: 03/02/2022  Patient will report at least 75% improvement in symptoms for improved quality of life. Baseline:  Goal status: IN PROGRESS  2.  Patient will improve FOTO score by at least 13 points in  order to indicate improved tolerance to activity. Baseline: 7/11: 65% function Goal status: IN PROGRESS  3.  Patient will be able to navigate stairs with reciprocal pattern without compensation in order to demonstrate improved LE strength. Baseline:  Goal status: IN PROGRESS  4.  Patient will be able to ambulate at least 400 feet in 2MWT in order to demonstrate improved tolerance to activity. Baseline: 275 feet Goal status: IN PROGRESS  5.  Patient will improve ROM for left knee extension/flexion to  0-115 degrees to improve squatting, and other functional mobility. Baseline: lacking 3 to 62 Goal status: IN PROGRESS  6.  Patient will be able to complete 5x STS in under 11.4 seconds without compensation in order to demonstrate improved functional strength. Baseline: transfers: relies RLE, left knee held in extension, UE use Goal status: IN PROGRESS    PLAN: PT FREQUENCY: 2x/week  PT DURATION: 6 weeks  PLANNED INTERVENTIONS: Therapeutic exercises, Therapeutic activity, Neuromuscular re-education, Balance training, Gait training, Patient/Family education, Joint manipulation, Joint mobilization, Stair training, Orthotic/Fit training, DME instructions, Aquatic Therapy, Dry Needling, Electrical stimulation, Spinal manipulation, Spinal mobilization, Cryotherapy, Moist heat, Compression bandaging, scar mobilization, Splintting, Taping, Traction, Ultrasound, Ionotophoresis '4mg'$ /ml Dexamethasone, and Manual therapy  PLAN FOR NEXT SESSION: left knee mobility, quad strength, functional strengthening as able   Mearl Latin, PT 01/21/2022, 2:33 PM

## 2022-01-26 ENCOUNTER — Ambulatory Visit (HOSPITAL_COMMUNITY): Payer: 59 | Admitting: Physical Therapy

## 2022-01-26 DIAGNOSIS — M25662 Stiffness of left knee, not elsewhere classified: Secondary | ICD-10-CM

## 2022-01-26 DIAGNOSIS — M6281 Muscle weakness (generalized): Secondary | ICD-10-CM

## 2022-01-26 DIAGNOSIS — M25562 Pain in left knee: Secondary | ICD-10-CM | POA: Diagnosis not present

## 2022-01-26 DIAGNOSIS — R29898 Other symptoms and signs involving the musculoskeletal system: Secondary | ICD-10-CM | POA: Diagnosis not present

## 2022-01-26 DIAGNOSIS — R2689 Other abnormalities of gait and mobility: Secondary | ICD-10-CM | POA: Diagnosis not present

## 2022-01-26 DIAGNOSIS — Z96652 Presence of left artificial knee joint: Secondary | ICD-10-CM | POA: Diagnosis not present

## 2022-01-26 NOTE — Therapy (Signed)
OUTPATIENT PHYSICAL THERAPY TREATMENT   Patient Name: Keith [name]. MRN: [id number] DOB:10-Sep-1940, 81 y.o., male Today's Date: 01/26/2022   PT End of Session - 01/26/22 1401     Visit Number 3    Number of Visits 12    Date for PT Re-Evaluation 03/02/22    Authorization Type Primary [employer](auth after 25th visit) Secondary: Medicare    Progress Note Due on Visit 10    PT Start Time 1350    PT Stop Time 1430    PT Time Calculation (min) 40 min    Activity Tolerance Patient tolerated treatment well    Behavior During Therapy [facility] for tasks assessed/performed             Past Medical History:  Diagnosis Date   Arthritis    Atrial fibrillation ([place])    Cardioversion 2016   Atrial flutter ([place])    Cardioversion 2013   Blood transfusion without reported diagnosis    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   Dysrhythmia    GERD (gastroesophageal reflux disease)    History of aortic valve replacement with bioprosthetic valve    Aortic regurgitation   History of chicken pox    History of kidney stones    History of skin cancer    History of stroke 2011   Hypothyroidism    Kidney stone    Seasonal allergies    Stroke [facility])    after heart surgery 2010   Past Surgical History:  Procedure Laterality Date   AORTIC VALVE REPLACEMENT  2009   Bioprosthetic AVR and root replacement   BACK SURGERY     total of 4 back surgeries   CATARACT EXTRACTION, BILATERAL     COLONOSCOPY     CYSTOSCOPY WITH RETROGRADE PYELOGRAM, URETEROSCOPY AND STENT PLACEMENT Bilateral 10/01/2019   Procedure: CYSTOSCOPY WITH RETROGRADE PYELOGRAM, URETEROSCOPY AND STENT PLACEMENT;  Surgeon: Irine Seal, MD;  [facility];  Service: Urology;  Laterality: Bilateral;   EPIGASTRIC HERNIA REPAIR     HERNIA REPAIR     LUMBAR LAMINECTOMY/DECOMPRESSION MICRODISCECTOMY Right 06/01/2017   Procedure: EXTRAFORAMINAL MICRODISCECTOMY LUMBAR FIVE- SACRAL ONE, RIGHT;  Surgeon: Eustace Moore, MD;   Location: [place];  Service: Neurosurgery;  Laterality: Right;   LUMBAR LAMINECTOMY/DECOMPRESSION MICRODISCECTOMY Bilateral 09/04/2018   Procedure: Laminectomy and Foraminotomy - Lumbar two-three bilateral;  Surgeon: Eustace Moore, MD;  Location: [place];  Service: Neurosurgery;  Laterality: Bilateral;   REVERSE SHOULDER ARTHROPLASTY Left 09/01/2021   Procedure: LEFT REVERSE SHOULDER ARTHROPLASTY;  Surgeon: Meredith Pel, MD;  Location: [place];  Service: Orthopedics;  Laterality: Left;   TOTAL HIP ARTHROPLASTY Left 12/19/2020   Procedure: LEFT TOTAL HIP ARTHROPLASTY ANTERIOR APPROACH;  Surgeon: Mcarthur Rossetti, MD;  [facility];  Service: Orthopedics;  Laterality: Left;   TOTAL KNEE ARTHROPLASTY Left 12/31/2021   Procedure: TOTAL KNEE ARTHROPLASTY;  Surgeon: Meredith Pel, MD;  Location: [place];  Service: Orthopedics;  Laterality: Left;   TRANSFORAMINAL LUMBAR INTERBODY FUSION (TLIF) WITH PEDICLE SCREW FIXATION 1 LEVEL Left 06/15/2019   Procedure: Decompression and Instrumention Fusion Lumbar Four- Five;  Surgeon: Eustace Moore, MD;  Location: [place];  Service: Neurosurgery;  Laterality: Left;  Decompression and Instrumention Fusion Lumbar Four- Five   Patient Active Problem List   Diagnosis Date Noted   Arthritis of left knee    S/P TKR (total knee replacement), left 12/31/2021   Arthritis of left shoulder region    S/P reverse  total shoulder arthroplasty, left 09/01/2021   Status post left hip replacement 12/19/2020   Primary osteoarthritis of left hip 12/04/2020   Hyperlipidemia 04/30/2020   Cataract 10/10/2019   Right ureteral stone 10/01/2019   Left ureteral stone 10/01/2019   S/P lumbar fusion 06/15/2019   Acquired hypothyroidism 08/10/2017   Benign prostatic hyperplasia with nocturia 08/10/2017   S/P lumbar laminectomy 06/01/2017   PAF (paroxysmal atrial fibrillation) ([place]) 09/07/2016   S/P AVR 01/18/2014   Hypertension 02/22/2013    PCP: Celene Squibb  MD  REFERRING PROVIDER: Meredith Pel, MD   REFERRING DIAG: [phone] (ICD-10-CM) - Status post left knee replacement   THERAPY DIAG:  Left knee pain, unspecified chronicity  Stiffness of left knee, not elsewhere classified  Muscle weakness (generalized)  Rationale for Evaluation and Treatment Rehabilitation  ONSET DATE: 12/31/21   SUBJECTIVE:   SUBJECTIVE STATEMENT: Patient states his knee is moving better but stiffness persists.  Continues to use SPC when walking outdoors.  Admits to not doing a lot of exercise other than using his CPM.  Feels his knee tightness is limiting him the most.    PERTINENT HISTORY: S/P left reverse shoulder arthroplasty completed on 09/01/21 by Dr. Marlou Sa., Hx afib, hx CVA, Hx skin cancer, L THA, Hx lumbar surgery  PAIN:  Are you having pain? Yes: NPRS scale: 0/10 current, Worst 4/10 Pain location: L knee  Pain description: sore Aggravating factors: exercise Relieving factors: rest  PRECAUTIONS: None  WEIGHT BEARING RESTRICTIONS No  FALLS:  Has patient fallen in last 6 months? 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[place]  PLOF: [place] get back to normal   OBJECTIVE:   Observation:  L knee held in extension, Relies on RLE for transfers  PATIENT SURVEYS:  FOTO 65% function  COGNITION:  Overall cognitive status: Within functional limits for tasks assessed     SENSATION: WFL   POSTURE: rounded shoulders and forward head  PALPATION: TTP L quads  LOWER EXTREMITY ROM:  Active ROM Right eval Left eval Left  01/21/22 Left 01/26/22  Hip flexion      Hip extension      Hip abduction      Hip adduction      Hip internal rotation      Hip external rotation      Knee flexion  62 83 improves to 88 with heel  slides and 92 following contract relax 92 After Scar mass  Knee extension  Lacking 3 Lacking 2 -2  Ankle dorsiflexion      Ankle plantarflexion      Ankle inversion      Ankle eversion       (Blank rows = not tested)  LOWER EXTREMITY MMT:  MMT Right eval Left eval  Hip flexion 5 5  Hip extension    Hip abduction    Hip adduction    Hip internal rotation    Hip external rotation    Knee flexion 5 4+  Knee extension 5 4+  Ankle dorsiflexion 5 5  Ankle plantarflexion    Ankle inversion    Ankle eversion     (Blank rows = not tested)   FUNCTIONAL TESTS:  2 minute walk test: 275 feet  GAIT: Distance walked: 275 feet Assistive device utilized: Single point  cane Level of assistance: Modified independence Comments: 2MWT, antalgic, limited knee flexion/extension ROM    TODAY'S TREATMENT: 01/26/22 Nustep 4 minutes LE only seat Standing:  heelraises 15X  Lt knee hamstring curl 10X  Lt knee flexion stretch onto 12" step 10X10" Supine:  heelslides 10X  Manual scar tissue, proximal scar tightest AROM/AAROM for knee flexion following scar massage 92/95 degrees  01/21/22 Bike seat 16 rocking dynamic warm up and ROM Supine heel slides 10x 10 second holds with strap Supine hamstring sets into green ball 10 x 10 second holds Contract relax for flexion 2x 10 5- 10 second holds in seated HR 1x 15  Squat at counter 1x 10 STS from chair with black foam and UE support 1x 10   01/19/22 Quad set 5x 10 second holds Heel slides 10 x 10 second holds with strap SLR 2x 10 with quad set Seated heel slides 5x 10 second holds   PATIENT EDUCATION:  Education details:01/19/22 Patient educated on exam findings, POC, scope of PT, HEP, and LE positioning. 01/21/22 HEP Person educated: Patient Education method: Consulting civil engineer, Demonstration, and Handouts Education comprehension: verbalized understanding, returned demonstration, verbal cues required, and tactile cues required    HOME  EXERCISE PROGRAM: 7/11/23Access Code: [place] Sitting Quad Set with Towel Roll Under Heel  - 3 x daily - 7 x weekly - 10 reps - 10 second hold - Supine Heel Slide with Strap  - 3-5 x daily - 7 x weekly - 10 reps - 10 second hold - Seated Heel Slide (Mirrored)  - 3-5 x daily - 7 x weekly - 10 reps - 10 second hold - Active Straight Leg Raise with Quad Set (Mirrored)  - 3 x daily - 7 x weekly - 3 sets - 10 reps  ASSESSMENT:  CLINICAL IMPRESSION: Began session on nustep for dynamic warm up and ROM.  Noted tightness perimeter of knee and along scar line.  Initiated scar massage and instructed with self massage to complete to reduce scar tissue/tightness.  Most scar tissue located proximally.  Noted improvement in mobility of tissue and reduction of adhesions following manual.  AROM also improved to 92 degrees in supine. Patient will continue to benefit from physical therapy in order to improve function and reduce impairment.   OBJECTIVE IMPAIRMENTS Abnormal gait, decreased balance, decreased mobility, difficulty walking, decreased ROM, decreased strength, increased edema, impaired flexibility, improper body mechanics, and pain.   ACTIVITY LIMITATIONS carrying, lifting, bending, standing, squatting, stairs, transfers, locomotion level, and caring for others  PARTICIPATION LIMITATIONS: meal prep, cleaning, laundry, driving, shopping, community activity, and yard work  PERSONAL FACTORS Age, Time since onset of injury/illness/exacerbation, and 3+ comorbidities: Hx afib, hx CVA, Hx skin cancer, L THA, Hx lumbar surgery, hx reverse shoulder replacement  are also affecting patient's functional outcome.   REHAB POTENTIAL: Good  CLINICAL DECISION MAKING: Stable/uncomplicated  EVALUATION COMPLEXITY: Low   GOALS: Goals reviewed with patient? Yes  SHORT TERM GOALS: Target date: 02/09/2022  Patient will be independent with HEP in order to improve functional outcomes. Baseline:  Goal status: IN  PROGRESS  2.  Patient will report at least 25% improvement in symptoms for improved quality of life. Baseline:  Goal status: IN PROGRESS    LONG TERM GOALS: Target date: 03/02/2022  Patient will report at least 75% improvement in symptoms for improved quality of life. Baseline:  Goal status: IN PROGRESS  2.  Patient will improve FOTO score by at least 13 points in order to indicate improved  tolerance to activity. Baseline: 7/11: 65% function Goal status: IN PROGRESS  3.  Patient will be able to navigate stairs with reciprocal pattern without compensation in order to demonstrate improved LE strength. Baseline:  Goal status: IN PROGRESS  4.  Patient will be able to ambulate at least 400 feet in 2MWT in order to demonstrate improved tolerance to activity. Baseline: 275 feet Goal status: IN PROGRESS  5.  Patient will improve ROM for left knee extension/flexion to 0-115 degrees to improve squatting, and other functional mobility. Baseline: lacking 3 to 62 Goal status: IN PROGRESS  6.  Patient will be able to complete 5x STS in under 11.4 seconds without compensation in order to demonstrate improved functional strength. Baseline: transfers: relies RLE, left knee held in extension, UE use Goal status: IN PROGRESS    PLAN: PT FREQUENCY: 2x/week  PT DURATION: 6 weeks  PLANNED INTERVENTIONS: Therapeutic exercises, Therapeutic activity, Neuromuscular re-education, Balance training, Gait training, Patient/Family education, Joint manipulation, Joint mobilization, Stair training, Orthotic/Fit training, DME instructions, Aquatic Therapy, Dry Needling, Electrical stimulation, Spinal manipulation, Spinal mobilization, Cryotherapy, Moist heat, Compression bandaging, scar mobilization, Splintting, Taping, Traction, Ultrasound, Ionotophoresis '4mg'$ /ml Dexamethasone, and Manual therapy  PLAN FOR NEXT SESSION: left knee mobility with progression to strengthening.  Continue with soft tissue massage  to reduce scar tissue and improve ROM.  Teena Irani, PTA/CLT [place] Ph: [phone]  Teena Irani, PTA 01/26/2022, 2:01 PM

## 2022-01-28 ENCOUNTER — Encounter (HOSPITAL_COMMUNITY): Payer: Self-pay

## 2022-01-28 ENCOUNTER — Ambulatory Visit (HOSPITAL_COMMUNITY): Payer: 59

## 2022-01-28 ENCOUNTER — Other Ambulatory Visit (HOSPITAL_COMMUNITY): Payer: Self-pay

## 2022-01-28 DIAGNOSIS — M6281 Muscle weakness (generalized): Secondary | ICD-10-CM

## 2022-01-28 DIAGNOSIS — R29898 Other symptoms and signs involving the musculoskeletal system: Secondary | ICD-10-CM | POA: Diagnosis not present

## 2022-01-28 DIAGNOSIS — M25562 Pain in left knee: Secondary | ICD-10-CM | POA: Diagnosis not present

## 2022-01-28 DIAGNOSIS — M25662 Stiffness of left knee, not elsewhere classified: Secondary | ICD-10-CM

## 2022-01-28 DIAGNOSIS — Z96652 Presence of left artificial knee joint: Secondary | ICD-10-CM | POA: Diagnosis not present

## 2022-01-28 DIAGNOSIS — R2689 Other abnormalities of gait and mobility: Secondary | ICD-10-CM | POA: Diagnosis not present

## 2022-01-28 NOTE — Therapy (Signed)
OUTPATIENT PHYSICAL THERAPY TREATMENT   Patient Name: Keith [name]. MRN: [id number] DOB:July 04, 1941, 81 y.o., male Today's Date: 01/28/2022   PT End of Session - 01/28/22 1717     Visit Number 4    Number of Visits 12    Date for PT Re-Evaluation 03/02/22    Authorization Type Primary [employer](auth after 25th visit) Secondary: Medicare    Progress Note Due on Visit 10    PT Start Time 1615    PT Stop Time 1653    PT Time Calculation (min) 38 min    Activity Tolerance Patient tolerated treatment well    Behavior During Therapy [facility] for tasks assessed/performed              Past Medical History:  Diagnosis Date   Arthritis    Atrial fibrillation ([place])    Cardioversion 2016   Atrial flutter ([place])    Cardioversion 2013   Blood transfusion without reported diagnosis    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   Dysrhythmia    GERD (gastroesophageal reflux disease)    History of aortic valve replacement with bioprosthetic valve    Aortic regurgitation   History of chicken pox    History of kidney stones    History of skin cancer    History of stroke 2011   Hypothyroidism    Kidney stone    Seasonal allergies    Stroke [facility])    after heart surgery 2010   Past Surgical History:  Procedure Laterality Date   AORTIC VALVE REPLACEMENT  2009   Bioprosthetic AVR and root replacement   BACK SURGERY     total of 4 back surgeries   CATARACT EXTRACTION, BILATERAL     COLONOSCOPY     CYSTOSCOPY WITH RETROGRADE PYELOGRAM, URETEROSCOPY AND STENT PLACEMENT Bilateral 10/01/2019   Procedure: CYSTOSCOPY WITH RETROGRADE PYELOGRAM, URETEROSCOPY AND STENT PLACEMENT;  Surgeon: Irine Seal, MD;  [facility];  Service: Urology;  Laterality: Bilateral;   EPIGASTRIC HERNIA REPAIR     HERNIA REPAIR     LUMBAR LAMINECTOMY/DECOMPRESSION MICRODISCECTOMY Right 06/01/2017   Procedure: EXTRAFORAMINAL MICRODISCECTOMY LUMBAR FIVE- SACRAL ONE, RIGHT;  Surgeon: Eustace Moore, MD;   Location: [place];  Service: Neurosurgery;  Laterality: Right;   LUMBAR LAMINECTOMY/DECOMPRESSION MICRODISCECTOMY Bilateral 09/04/2018   Procedure: Laminectomy and Foraminotomy - Lumbar two-three bilateral;  Surgeon: Eustace Moore, MD;  Location: [place];  Service: Neurosurgery;  Laterality: Bilateral;   REVERSE SHOULDER ARTHROPLASTY Left 09/01/2021   Procedure: LEFT REVERSE SHOULDER ARTHROPLASTY;  Surgeon: Meredith Pel, MD;  Location: [place];  Service: Orthopedics;  Laterality: Left;   TOTAL HIP ARTHROPLASTY Left 12/19/2020   Procedure: LEFT TOTAL HIP ARTHROPLASTY ANTERIOR APPROACH;  Surgeon: Mcarthur Rossetti, MD;  [facility];  Service: Orthopedics;  Laterality: Left;   TOTAL KNEE ARTHROPLASTY Left 12/31/2021   Procedure: TOTAL KNEE ARTHROPLASTY;  Surgeon: Meredith Pel, MD;  Location: [place];  Service: Orthopedics;  Laterality: Left;   TRANSFORAMINAL LUMBAR INTERBODY FUSION (TLIF) WITH PEDICLE SCREW FIXATION 1 LEVEL Left 06/15/2019   Procedure: Decompression and Instrumention Fusion Lumbar Four- Five;  Surgeon: Eustace Moore, MD;  Location: [place];  Service: Neurosurgery;  Laterality: Left;  Decompression and Instrumention Fusion Lumbar Four- Five   Patient Active Problem List   Diagnosis Date Noted   Arthritis of left knee    S/P TKR (total knee replacement), left 12/31/2021   Arthritis of left shoulder region    S/P  reverse total shoulder arthroplasty, left 09/01/2021   Status post left hip replacement 12/19/2020   Primary osteoarthritis of left hip 12/04/2020   Hyperlipidemia 04/30/2020   Cataract 10/10/2019   Right ureteral stone 10/01/2019   Left ureteral stone 10/01/2019   S/P lumbar fusion 06/15/2019   Acquired hypothyroidism 08/10/2017   Benign prostatic hyperplasia with nocturia 08/10/2017   S/P lumbar laminectomy 06/01/2017   PAF (paroxysmal atrial fibrillation) ([place]) 09/07/2016   S/P AVR 01/18/2014   Hypertension 02/22/2013    PCP: Celene Squibb  MD  REFERRING PROVIDER: Meredith Pel, MD   REFERRING DIAG: [phone] (ICD-10-CM) - Status post left knee replacement   THERAPY DIAG:  Left knee pain, unspecified chronicity  Stiffness of left knee, not elsewhere classified  Muscle weakness (generalized)  Other abnormalities of gait and mobility  Rationale for Evaluation and Treatment Rehabilitation  ONSET DATE: 12/31/21   SUBJECTIVE:   SUBJECTIVE STATEMENT: Pt stated he has minimal pain today, pain scale 1-2/10.  Reports the massage felt very helpful last session.  Pt ambulated with SPC, stated he goes without AD at home  PERTINENT HISTORY: S/P left reverse shoulder arthroplasty completed on 09/01/21 by Dr. Marlou Sa., Hx afib, hx CVA, Hx skin cancer, L THA, Hx lumbar surgery  PAIN:  Are you having pain? Yes: NPRS scale: 1-2/10 Pain location: L knee  Pain description: sore Aggravating factors: exercise Relieving factors: rest  PRECAUTIONS: None  WEIGHT BEARING RESTRICTIONS No  FALLS:  Has patient fallen in last 6 months? 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[place]  PLOF: [place] get back to normal   OBJECTIVE:   Observation:  L knee held in extension, Relies on RLE for transfers  PATIENT SURVEYS:  FOTO 65% function  COGNITION:  Overall cognitive status: Within functional limits for tasks assessed     SENSATION: WFL   POSTURE: rounded shoulders and forward head  PALPATION: TTP L quads  LOWER EXTREMITY ROM:  Active ROM Right eval Left eval Left  01/21/22 Left 01/26/22  Hip flexion      Hip extension      Hip abduction      Hip adduction      Hip internal rotation      Hip external rotation      Knee flexion  62 83 improves to 88 with heel slides and 92 following contract  relax 92 After Scar mass  Knee extension  Lacking 3 Lacking 2 -2  Ankle dorsiflexion      Ankle plantarflexion      Ankle inversion      Ankle eversion       (Blank rows = not tested)  LOWER EXTREMITY MMT:  MMT Right eval Left eval  Hip flexion 5 5  Hip extension    Hip abduction    Hip adduction    Hip internal rotation    Hip external rotation    Knee flexion 5 4+  Knee extension 5 4+  Ankle dorsiflexion 5 5  Ankle plantarflexion    Ankle inversion    Ankle eversion     (Blank rows = not tested)   FUNCTIONAL TESTS:  2 minute walk test: 275 feet  GAIT: Distance walked: 275 feet Assistive device utilized: Single point cane Level of assistance: Modified independence Comments: 2MWT,  antalgic, limited knee flexion/extension ROM    TODAY'S TREATMENT: 01/28/22: Bike seat 15 rocking with hold at end range Standing: 12in step height knee drive 40N 10" for flexion Lt knee hamstring curl 10x 3" Supine Heel slide 10x  PROM 3x 10" for flexion  SKTC 3x 30" Manual scar tissue massage, proximal scat tightness, no scar tissue on distal due to steri strips Prone: contract/relax 10" holds for flexion 3x  Quad stretch with rope 3x 30" AROM initiall at 95 degrees, PROM 97, following SKTC 103 degrees flexion  01/26/22 Nustep 4 minutes LE only seat Standing:  heelraises 15X  Lt knee hamstring curl 10X  Lt knee flexion stretch onto 12" step 10X10" Supine:  heelslides 10X  Manual scar tissue, proximal scar tightest AROM/AAROM for knee flexion following scar massage 92/95 degrees  01/21/22 Bike seat 16 rocking dynamic warm up and ROM Supine heel slides 10x 10 second holds with strap Supine hamstring sets into green ball 10 x 10 second holds Contract relax for flexion 2x 10 5- 10 second holds in seated HR 1x 15  Squat at counter 1x 10 STS from chair with black foam and UE support 1x 10   01/19/22 Quad set 5x 10 second holds Heel slides 10 x 10 second holds with strap SLR  2x 10 with quad set Seated heel slides 5x 10 second holds   PATIENT EDUCATION:  Education details:01/19/22 Patient educated on exam findings, POC, scope of PT, HEP, and LE positioning. 01/21/22 HEP Person educated: Patient Education method: Consulting civil engineer, Demonstration, and Handouts Education comprehension: verbalized understanding, returned demonstration, verbal cues required, and tactile cues required    HOME EXERCISE PROGRAM: 7/11/23Access Code: [place] Sitting Quad Set with Towel Roll Under Heel  - 3 x daily - 7 x weekly - 10 reps - 10 second hold - Supine Heel Slide with Strap  - 3-5 x daily - 7 x weekly - 10 reps - 10 second hold - Seated Heel Slide (Mirrored)  - 3-5 x daily - 7 x weekly - 10 reps - 10 second hold - Active Straight Leg Raise with Quad Set (Mirrored)  - 3 x daily - 7 x weekly - 3 sets - 10 reps  ASSESSMENT:  CLINICAL IMPRESSION: Session focus with knee mobility primarily flexion.  Manual scar tissue massage complete to address scar tissue restrictions.  Added contract relax and prone quad stretch that was tolerated well as well as supine SKTC stretch.  Initial AROM at 95 degrees, improved to 103 degrees following manual and stretches.    OBJECTIVE IMPAIRMENTS Abnormal gait, decreased balance, decreased mobility, difficulty walking, decreased ROM, decreased strength, increased edema, impaired flexibility, improper body mechanics, and pain.   ACTIVITY LIMITATIONS carrying, lifting, bending, standing, squatting, stairs, transfers, locomotion level, and caring for others  PARTICIPATION LIMITATIONS: meal prep, cleaning, laundry, driving, shopping, community activity, and yard work  PERSONAL FACTORS Age, Time since onset of injury/illness/exacerbation, and 3+ comorbidities: Hx afib, hx CVA, Hx skin cancer, L THA, Hx lumbar surgery, hx reverse shoulder replacement  are also affecting patient's functional outcome.   REHAB POTENTIAL: Good  CLINICAL DECISION MAKING:  Stable/uncomplicated  EVALUATION COMPLEXITY: Low   GOALS: Goals reviewed with patient? Yes  SHORT TERM GOALS: Target date: 02/09/2022  Patient will be independent with HEP in order to improve functional outcomes. Baseline:  Goal status: IN PROGRESS  2.  Patient will report at least 25% improvement in symptoms for improved quality of life. Baseline:  Goal status: IN PROGRESS  LONG TERM GOALS: Target date: 03/02/2022  Patient will report at least 75% improvement in symptoms for improved quality of life. Baseline:  Goal status: IN PROGRESS  2.  Patient will improve FOTO score by at least 13 points in order to indicate improved tolerance to activity. Baseline: 7/11: 65% function Goal status: IN PROGRESS  3.  Patient will be able to navigate stairs with reciprocal pattern without compensation in order to demonstrate improved LE strength. Baseline:  Goal status: IN PROGRESS  4.  Patient will be able to ambulate at least 400 feet in 2MWT in order to demonstrate improved tolerance to activity. Baseline: 275 feet Goal status: IN PROGRESS  5.  Patient will improve ROM for left knee extension/flexion to 0-115 degrees to improve squatting, and other functional mobility. Baseline: lacking 3 to 62 Goal status: IN PROGRESS  6.  Patient will be able to complete 5x STS in under 11.4 seconds without compensation in order to demonstrate improved functional strength. Baseline: transfers: relies RLE, left knee held in extension, UE use Goal status: IN PROGRESS    PLAN: PT FREQUENCY: 2x/week  PT DURATION: 6 weeks  PLANNED INTERVENTIONS: Therapeutic exercises, Therapeutic activity, Neuromuscular re-education, Balance training, Gait training, Patient/Family education, Joint manipulation, Joint mobilization, Stair training, Orthotic/Fit training, DME instructions, Aquatic Therapy, Dry Needling, Electrical stimulation, Spinal manipulation, Spinal mobilization, Cryotherapy, Moist heat,  Compression bandaging, scar mobilization, Splintting, Taping, Traction, Ultrasound, Ionotophoresis '4mg'$ /ml Dexamethasone, and Manual therapy  PLAN FOR NEXT SESSION: left knee mobility with progression to strengthening.  Continue with soft tissue massage to reduce scar tissue and improve ROM.  Patella mobs when able.    Ihor Austin, LPTA/CLT; CBIS [phone]   Aldona Lento, PTA 01/28/2022, 5:18 PM

## 2022-01-29 ENCOUNTER — Other Ambulatory Visit (HOSPITAL_COMMUNITY): Payer: Self-pay

## 2022-02-02 ENCOUNTER — Ambulatory Visit (HOSPITAL_COMMUNITY): Payer: 59

## 2022-02-02 ENCOUNTER — Other Ambulatory Visit: Payer: Self-pay | Admitting: Surgical

## 2022-02-02 ENCOUNTER — Encounter (HOSPITAL_COMMUNITY): Payer: Self-pay

## 2022-02-02 DIAGNOSIS — R2689 Other abnormalities of gait and mobility: Secondary | ICD-10-CM

## 2022-02-02 DIAGNOSIS — M6281 Muscle weakness (generalized): Secondary | ICD-10-CM | POA: Diagnosis not present

## 2022-02-02 DIAGNOSIS — M25662 Stiffness of left knee, not elsewhere classified: Secondary | ICD-10-CM | POA: Diagnosis not present

## 2022-02-02 DIAGNOSIS — M25562 Pain in left knee: Secondary | ICD-10-CM

## 2022-02-02 DIAGNOSIS — R29898 Other symptoms and signs involving the musculoskeletal system: Secondary | ICD-10-CM | POA: Diagnosis not present

## 2022-02-02 DIAGNOSIS — Z96652 Presence of left artificial knee joint: Secondary | ICD-10-CM | POA: Diagnosis not present

## 2022-02-02 NOTE — Therapy (Addendum)
OUTPATIENT PHYSICAL THERAPY TREATMENT   Patient Name: Keith [name]. MRN: [id number] DOB:1940/12/01, 81 y.o., male Today's Date: 02/02/2022    02/02/22 1638  PT Visits / Re-Eval  Visit Number 5  Number of Visits 12  Date for PT Re-Evaluation 03/02/22  Authorization  Authorization Type Primary [employer](auth after 25th visit) Secondary: Medicare  Progress Note Due on Visit 10  PT Time Calculation  PT Start Time 1604  PT Stop Time 1647  PT Time Calculation (min) 43 min  PT - End of Session  Activity Tolerance Patient tolerated treatment well  Behavior During Therapy WFL for tasks assessed/performed     Past Medical History:  Diagnosis Date   Arthritis    Atrial fibrillation ([place])    Cardioversion 2016   Atrial flutter ([place])    Cardioversion 2013   Blood transfusion without reported diagnosis    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   Dysrhythmia    GERD (gastroesophageal reflux disease)    History of aortic valve replacement with bioprosthetic valve    Aortic regurgitation   History of chicken pox    History of kidney stones    History of skin cancer    History of stroke 2011   Hypothyroidism    Kidney stone    Seasonal allergies    Stroke [facility])    after heart surgery 2010   Past Surgical History:  Procedure Laterality Date   AORTIC VALVE REPLACEMENT  2009   Bioprosthetic AVR and root replacement   BACK SURGERY     total of 4 back surgeries   CATARACT EXTRACTION, BILATERAL     COLONOSCOPY     CYSTOSCOPY WITH RETROGRADE PYELOGRAM, URETEROSCOPY AND STENT PLACEMENT Bilateral 10/01/2019   Procedure: CYSTOSCOPY WITH RETROGRADE PYELOGRAM, URETEROSCOPY AND STENT PLACEMENT;  Surgeon: Irine Seal, MD;  Location: [facility];  Service: Urology;  Laterality: Bilateral;   EPIGASTRIC HERNIA REPAIR     HERNIA REPAIR     LUMBAR LAMINECTOMY/DECOMPRESSION MICRODISCECTOMY Right 06/01/2017   Procedure: EXTRAFORAMINAL MICRODISCECTOMY LUMBAR FIVE- SACRAL ONE, RIGHT;   Surgeon: Eustace Moore, MD;  Location: [place];  Service: Neurosurgery;  Laterality: Right;   LUMBAR LAMINECTOMY/DECOMPRESSION MICRODISCECTOMY Bilateral 09/04/2018   Procedure: Laminectomy and Foraminotomy - Lumbar two-three bilateral;  Surgeon: Eustace Moore, MD;  Location: [place];  Service: Neurosurgery;  Laterality: Bilateral;   REVERSE SHOULDER ARTHROPLASTY Left 09/01/2021   Procedure: LEFT REVERSE SHOULDER ARTHROPLASTY;  Surgeon: Meredith Pel, MD;  Location: [place];  Service: Orthopedics;  Laterality: Left;   TOTAL HIP ARTHROPLASTY Left 12/19/2020   Procedure: LEFT TOTAL HIP ARTHROPLASTY ANTERIOR APPROACH;  Surgeon: Mcarthur Rossetti, MD;  Location: [facility];  Service: Orthopedics;  Laterality: Left;   TOTAL KNEE ARTHROPLASTY Left 12/31/2021   Procedure: TOTAL KNEE ARTHROPLASTY;  Surgeon: Meredith Pel, MD;  Location: [place];  Service: Orthopedics;  Laterality: Left;   TRANSFORAMINAL LUMBAR INTERBODY FUSION (TLIF) WITH PEDICLE SCREW FIXATION 1 LEVEL Left 06/15/2019   Procedure: Decompression and Instrumention Fusion Lumbar Four- Five;  Surgeon: Eustace Moore, MD;  Location: [place];  Service: Neurosurgery;  Laterality: Left;  Decompression and Instrumention Fusion Lumbar Four- Five   Patient Active Problem List   Diagnosis Date Noted   Arthritis of left knee    S/P TKR (total knee replacement), left 12/31/2021   Arthritis of left shoulder region    S/P reverse total shoulder arthroplasty, left 09/01/2021   Status post left hip replacement 12/19/2020   Primary  osteoarthritis of left hip 12/04/2020   Hyperlipidemia 04/30/2020   Cataract 10/10/2019   Right ureteral stone 10/01/2019   Left ureteral stone 10/01/2019   S/P lumbar fusion 06/15/2019   Acquired hypothyroidism 08/10/2017   Benign prostatic hyperplasia with nocturia 08/10/2017   S/P lumbar laminectomy 06/01/2017   PAF (paroxysmal atrial fibrillation) ([place]) 09/07/2016   S/P AVR 01/18/2014   Hypertension 02/22/2013     PCP: Celene Squibb MD  REFERRING PROVIDER: Meredith Pel, MD   REFERRING DIAG: [phone] (ICD-10-CM) - Status post left knee replacement   THERAPY DIAG:  Left knee pain, unspecified chronicity  Stiffness of left knee, not elsewhere classified  Muscle weakness (generalized)  Other abnormalities of gait and mobility  Rationale for Evaluation and Treatment Rehabilitation  ONSET DATE: 12/31/21   SUBJECTIVE:   SUBJECTIVE STATEMENT: Pt stated he has increased CPM to 107 degrees.  Pain scale 1-2/10.  Has began scar tissue massage at home.  Steri strips have completely fallen off.  Reports he has been walking without SPC at home with no falls or close calls.    PERTINENT HISTORY: S/P left reverse shoulder arthroplasty completed on 09/01/21 by Dr. Marlou Sa., Hx afib, hx CVA, Hx skin cancer, L THA, Hx lumbar surgery  PAIN:  Are you having pain? Yes: NPRS scale: 1-2/10 Pain location: L knee  Pain description: sore Aggravating factors: exercise Relieving factors: rest  PRECAUTIONS: None  WEIGHT BEARING RESTRICTIONS No  FALLS:  Has patient fallen in last 6 months? 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[place]  PLOF: [place] get back to normal   OBJECTIVE:   Observation:  L knee held in extension, Relies on RLE for transfers  PATIENT SURVEYS:  FOTO 65% function  COGNITION:  Overall cognitive status: Within functional limits for tasks assessed     SENSATION: WFL   POSTURE: rounded shoulders and forward head  PALPATION: TTP L quads  LOWER EXTREMITY ROM:  Active ROM Right eval Left eval Left  01/21/22 Left 01/26/22 Left 02/02/22  Hip flexion       Hip extension       Hip abduction       Hip adduction       Hip internal rotation       Hip  external rotation       Knee flexion  62 83 improves to 88 with heel slides and 92 following contract relax 92 After Scar mass AROM 102  Knee extension  Lacking 3 Lacking 2 -2 2  Ankle dorsiflexion       Ankle plantarflexion       Ankle inversion       Ankle eversion        (Blank rows = not tested)  LOWER EXTREMITY MMT:  MMT Right eval Left eval  Hip flexion 5 5  Hip extension    Hip abduction    Hip adduction    Hip internal rotation    Hip external rotation    Knee flexion 5 4+  Knee extension 5 4+  Ankle dorsiflexion 5 5  Ankle plantarflexion    Ankle inversion    Ankle eversion     (Blank rows = not tested)   FUNCTIONAL TESTS:  2 minute walk test: 275 feet  GAIT: Distance walked: 275 feet  Assistive device utilized: Single point cane Level of assistance: Modified independence Comments: 2MWT, antalgic, limited knee flexion/extension ROM    TODAY'S TREATMENT: 02/02/22:   Bike seat 15 rocking with hold at end range Standing: 12in step height knee drive 44I 10" for flexion Hamstring stretch 3x 30" on 12in step Heel/toe raises on slope 15x       Prone: contract/relax 10" holds for flexoin 5x    Quad stretch 3x 30"   Manual scar tissue massage and patella mobs all direction   Supine: TKE on 1/2 bolster   AROM 2-103 degrees  01/28/22: Bike seat 15 rocking with hold at end range Standing: 12in step height knee drive 34V 10" for flexion Lt knee hamstring curl 10x 3" Supine Heel slide 10x  PROM 3x 10" for flexion  SKTC 3x 30" Manual scar tissue massage, proximal scat tightness, no scar tissue on distal due to steri strips Prone: contract/relax 10" holds for flexion 3x  Quad stretch with rope 3x 30" AROM initiall at 95 degrees, PROM 97, following SKTC 103 degrees flexion  01/26/22 Nustep 4 minutes LE only seat Standing:  heelraises 15X  Lt knee hamstring curl 10X  Lt knee flexion stretch onto 12" step 10X10" Supine:  heelslides 10X  Manual scar tissue,  proximal scar tightest AROM/AAROM for knee flexion following scar massage 92/95 degrees  01/21/22 Bike seat 16 rocking dynamic warm up and ROM Supine heel slides 10x 10 second holds with strap Supine hamstring sets into green ball 10 x 10 second holds Contract relax for flexion 2x 10 5- 10 second holds in seated HR 1x 15  Squat at counter 1x 10 STS from chair with black foam and UE support 1x 10   01/19/22 Quad set 5x 10 second holds Heel slides 10 x 10 second holds with strap SLR 2x 10 with quad set Seated heel slides 5x 10 second holds   PATIENT EDUCATION:  Education details:01/19/22 Patient educated on exam findings, POC, scope of PT, HEP, and LE positioning. 01/21/22 HEP Person educated: Patient Education method: Consulting civil engineer, Demonstration, and Handouts Education comprehension: verbalized understanding, returned demonstration, verbal cues required, and tactile cues required    HOME EXERCISE PROGRAM: 7/11/23Access Code: [place] Sitting Quad Set with Towel Roll Under Heel  - 3 x daily - 7 x weekly - 10 reps - 10 second hold - Supine Heel Slide with Strap  - 3-5 x daily - 7 x weekly - 10 reps - 10 second hold - Seated Heel Slide (Mirrored)  - 3-5 x daily - 7 x weekly - 10 reps - 10 second hold - Active Straight Leg Raise with Quad Set (Mirrored)  - 3 x daily - 7 x weekly - 3 sets - 10 reps  ASSESSMENT:  CLINICAL IMPRESSION: Session focus with knee mobility primarily flexion.  Manual scar tissue massage complete to address scar tissue restrictions.  Added contract relax and prone quad stretch that was tolerated well as well as supine SKTC stretch.  Initial AROM at 95 degrees, improved to 103 degrees following manual and stretches.    Primary focus with knee mobility.  Steri strips have all fallen off.  Manual scar tissue massage to address scar tissue restrictions.  Added patella mobs with good mobility all directions.  Continued with contract/relax for flexion.  AROM 2-103  degrees at EOS.    OBJECTIVE IMPAIRMENTS Abnormal gait, decreased balance, decreased mobility, difficulty walking, decreased ROM, decreased strength, increased edema, impaired flexibility, improper body mechanics, and pain.  ACTIVITY LIMITATIONS carrying, lifting, bending, standing, squatting, stairs, transfers, locomotion level, and caring for others  PARTICIPATION LIMITATIONS: meal prep, cleaning, laundry, driving, shopping, community activity, and yard work  PERSONAL FACTORS Age, Time since onset of injury/illness/exacerbation, and 3+ comorbidities: Hx afib, hx CVA, Hx skin cancer, L THA, Hx lumbar surgery, hx reverse shoulder replacement  are also affecting patient's functional outcome.   REHAB POTENTIAL: Good  CLINICAL DECISION MAKING: Stable/uncomplicated  EVALUATION COMPLEXITY: Low   GOALS: Goals reviewed with patient? Yes  SHORT TERM GOALS: Target date: 02/09/2022  Patient will be independent with HEP in order to improve functional outcomes. Baseline:  Goal status: IN PROGRESS  2.  Patient will report at least 25% improvement in symptoms for improved quality of life. Baseline:  Goal status: IN PROGRESS    LONG TERM GOALS: Target date: 03/02/2022  Patient will report at least 75% improvement in symptoms for improved quality of life. Baseline:  Goal status: IN PROGRESS  2.  Patient will improve FOTO score by at least 13 points in order to indicate improved tolerance to activity. Baseline: 7/11: 65% function Goal status: IN PROGRESS  3.  Patient will be able to navigate stairs with reciprocal pattern without compensation in order to demonstrate improved LE strength. Baseline:  Goal status: IN PROGRESS  4.  Patient will be able to ambulate at least 400 feet in 2MWT in order to demonstrate improved tolerance to activity. Baseline: 275 feet Goal status: IN PROGRESS  5.  Patient will improve ROM for left knee extension/flexion to 0-115 degrees to improve squatting,  and other functional mobility. Baseline: lacking 3 to 62 Goal status: IN PROGRESS  6.  Patient will be able to complete 5x STS in under 11.4 seconds without compensation in order to demonstrate improved functional strength. Baseline: transfers: relies RLE, left knee held in extension, UE use Goal status: IN PROGRESS    PLAN: PT FREQUENCY: 2x/week  PT DURATION: 6 weeks  PLANNED INTERVENTIONS: Therapeutic exercises, Therapeutic activity, Neuromuscular re-education, Balance training, Gait training, Patient/Family education, Joint manipulation, Joint mobilization, Stair training, Orthotic/Fit training, DME instructions, Aquatic Therapy, Dry Needling, Electrical stimulation, Spinal manipulation, Spinal mobilization, Cryotherapy, Moist heat, Compression bandaging, scar mobilization, Splintting, Taping, Traction, Ultrasound, Ionotophoresis '4mg'$ /ml Dexamethasone, and Manual therapy  PLAN FOR NEXT SESSION: left knee mobility with progression to strengthening.  Continue with soft tissue massage to reduce scar tissue and improve ROM.    Ihor Austin, LPTA/CLT; CBIS [phone]  Aldona Lento, PTA 02/02/2022, 4:39 PM

## 2022-02-03 ENCOUNTER — Other Ambulatory Visit (HOSPITAL_COMMUNITY): Payer: Self-pay

## 2022-02-03 MED ORDER — OXYCODONE HCL 5 MG PO TABS
5.0000 mg | ORAL_TABLET | Freq: Two times a day (BID) | ORAL | 0 refills | Status: DC | PRN
  Filled 2022-02-03: qty 30, 15d supply, fill #0

## 2022-02-03 MED ORDER — METHOCARBAMOL 500 MG PO TABS
500.0000 mg | ORAL_TABLET | Freq: Four times a day (QID) | ORAL | 0 refills | Status: DC | PRN
  Filled 2022-02-03: qty 30, 8d supply, fill #0

## 2022-02-03 NOTE — Telephone Encounter (Signed)
Okay to refill thanks

## 2022-02-05 ENCOUNTER — Encounter (HOSPITAL_COMMUNITY): Payer: 59 | Admitting: Physical Therapy

## 2022-02-09 ENCOUNTER — Ambulatory Visit (HOSPITAL_COMMUNITY): Payer: 59 | Attending: Orthopedic Surgery | Admitting: Physical Therapy

## 2022-02-09 DIAGNOSIS — M25612 Stiffness of left shoulder, not elsewhere classified: Secondary | ICD-10-CM | POA: Insufficient documentation

## 2022-02-09 DIAGNOSIS — R29898 Other symptoms and signs involving the musculoskeletal system: Secondary | ICD-10-CM | POA: Diagnosis not present

## 2022-02-09 DIAGNOSIS — R2689 Other abnormalities of gait and mobility: Secondary | ICD-10-CM | POA: Diagnosis not present

## 2022-02-09 DIAGNOSIS — M25512 Pain in left shoulder: Secondary | ICD-10-CM | POA: Insufficient documentation

## 2022-02-09 DIAGNOSIS — M25662 Stiffness of left knee, not elsewhere classified: Secondary | ICD-10-CM | POA: Insufficient documentation

## 2022-02-09 DIAGNOSIS — M25562 Pain in left knee: Secondary | ICD-10-CM | POA: Insufficient documentation

## 2022-02-09 DIAGNOSIS — M6281 Muscle weakness (generalized): Secondary | ICD-10-CM | POA: Diagnosis not present

## 2022-02-09 NOTE — Therapy (Signed)
OUTPATIENT PHYSICAL THERAPY TREATMENT   Patient Name: Keith [name]. MRN: [id number] DOB:Sep 04, 1940, 81 y.o., male Today's Date: 02/09/2022   PT End of Session - 02/09/22 1535     Visit Number 6    Number of Visits 12    Date for PT Re-Evaluation 03/02/22    Authorization Type Primary [employer](auth after 25th visit) Secondary: Medicare    Progress Note Due on Visit 10    PT Start Time 1345    PT Stop Time 1430    PT Time Calculation (min) 45 min    Activity Tolerance Patient tolerated treatment well    Behavior During Therapy [facility] for tasks assessed/performed                Past Medical History:  Diagnosis Date   Arthritis    Atrial fibrillation ([place])    Cardioversion 2016   Atrial flutter ([place])    Cardioversion 2013   Blood transfusion without reported diagnosis    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   Dysrhythmia    GERD (gastroesophageal reflux disease)    History of aortic valve replacement with bioprosthetic valve    Aortic regurgitation   History of chicken pox    History of kidney stones    History of skin cancer    History of stroke 2011   Hypothyroidism    Kidney stone    Seasonal allergies    Stroke [facility])    after heart surgery 2010   Past Surgical History:  Procedure Laterality Date   AORTIC VALVE REPLACEMENT  2009   Bioprosthetic AVR and root replacement   BACK SURGERY     total of 4 back surgeries   CATARACT EXTRACTION, BILATERAL     COLONOSCOPY     CYSTOSCOPY WITH RETROGRADE PYELOGRAM, URETEROSCOPY AND STENT PLACEMENT Bilateral 10/01/2019   Procedure: CYSTOSCOPY WITH RETROGRADE PYELOGRAM, URETEROSCOPY AND STENT PLACEMENT;  Surgeon: Irine Seal, MD;  [facility]: [facility];  Service: Urology;  Laterality: Bilateral;   EPIGASTRIC HERNIA REPAIR     HERNIA REPAIR     LUMBAR LAMINECTOMY/DECOMPRESSION MICRODISCECTOMY Right 06/01/2017   Procedure: EXTRAFORAMINAL MICRODISCECTOMY LUMBAR FIVE- SACRAL ONE, RIGHT;  Surgeon: Eustace Moore,  MD;  Location: [place];  Service: Neurosurgery;  Laterality: Right;   LUMBAR LAMINECTOMY/DECOMPRESSION MICRODISCECTOMY Bilateral 09/04/2018   Procedure: Laminectomy and Foraminotomy - Lumbar two-three bilateral;  Surgeon: Eustace Moore, MD;  Location: [place];  Service: Neurosurgery;  Laterality: Bilateral;   REVERSE SHOULDER ARTHROPLASTY Left 09/01/2021   Procedure: LEFT REVERSE SHOULDER ARTHROPLASTY;  Surgeon: Meredith Pel, MD;  Location: [place];  Service: Orthopedics;  Laterality: Left;   TOTAL HIP ARTHROPLASTY Left 12/19/2020   Procedure: LEFT TOTAL HIP ARTHROPLASTY ANTERIOR APPROACH;  Surgeon: Mcarthur Rossetti, MD;  [facility];  Service: Orthopedics;  Laterality: Left;   TOTAL KNEE ARTHROPLASTY Left 12/31/2021   Procedure: TOTAL KNEE ARTHROPLASTY;  Surgeon: Meredith Pel, MD;  Location: [place];  Service: Orthopedics;  Laterality: Left;   TRANSFORAMINAL LUMBAR INTERBODY FUSION (TLIF) WITH PEDICLE SCREW FIXATION 1 LEVEL Left 06/15/2019   Procedure: Decompression and Instrumention Fusion Lumbar Four- Five;  Surgeon: Eustace Moore, MD;  Location: [place];  Service: Neurosurgery;  Laterality: Left;  Decompression and Instrumention Fusion Lumbar Four- Five   Patient Active Problem List   Diagnosis Date Noted   Arthritis of left knee    S/P TKR (total knee replacement), left 12/31/2021   Arthritis of left shoulder region  S/P reverse total shoulder arthroplasty, left 09/01/2021   Status post left hip replacement 12/19/2020   Primary osteoarthritis of left hip 12/04/2020   Hyperlipidemia 04/30/2020   Cataract 10/10/2019   Right ureteral stone 10/01/2019   Left ureteral stone 10/01/2019   S/P lumbar fusion 06/15/2019   Acquired hypothyroidism 08/10/2017   Benign prostatic hyperplasia with nocturia 08/10/2017   S/P lumbar laminectomy 06/01/2017   PAF (paroxysmal atrial fibrillation) ([place]) 09/07/2016   S/P AVR 01/18/2014   Hypertension 02/22/2013    PCP: Celene Squibb  MD  REFERRING PROVIDER: Meredith Pel, MD   REFERRING DIAG: [phone] (ICD-10-CM) - Status post left knee replacement   THERAPY DIAG:  Left knee pain, unspecified chronicity  Stiffness of left knee, not elsewhere classified  Muscle weakness (generalized)  Rationale for Evaluation and Treatment Rehabilitation  ONSET DATE: 12/31/21   SUBJECTIVE:   SUBJECTIVE STATEMENT: Pt states he's been working on his ROM diligently at home.  No pain or issues other than tightness.   PERTINENT HISTORY: S/P left reverse shoulder arthroplasty completed on 09/01/21 by Dr. Marlou Sa., Hx afib, hx CVA, Hx skin cancer, L THA, Hx lumbar surgery  PAIN:  Are you having pain? Yes: NPRS scale: 1-2/10 Pain location: L knee  Pain description: sore Aggravating factors: exercise Relieving factors: rest  PRECAUTIONS: None  WEIGHT BEARING RESTRICTIONS No  FALLS:  Has patient fallen in last 6 months? 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   OBJECTIVE:   Observation:  L knee held in extension, Relies on RLE for transfers  PATIENT SURVEYS:  FOTO 65% function  COGNITION:  Overall cognitive status: Within functional limits for tasks assessed     SENSATION: WFL   POSTURE: rounded shoulders and forward head  PALPATION: TTP L quads  LOWER EXTREMITY ROM:  Active ROM Right eval Left eval Left  01/21/22 Left 01/26/22 Left 02/02/22 Left 02/09/22  Hip flexion        Hip extension        Hip abduction        Hip adduction        Hip internal rotation        Hip external rotation        Knee flexion  62 83 improves to 88 with heel slides and 92 following contract relax 92 After Scar mass AROM 102 AROM 94 AAROM 96  Knee extension   Lacking 3 Lacking 2 -2 2   Ankle dorsiflexion        Ankle plantarflexion        Ankle inversion        Ankle eversion         (Blank rows = not tested)  LOWER EXTREMITY MMT:  MMT Right eval Left eval  Hip flexion 5 5  Hip extension    Hip abduction    Hip adduction    Hip internal rotation    Hip external rotation    Knee flexion 5 4+  Knee extension 5 4+  Ankle dorsiflexion 5 5  Ankle plantarflexion    Ankle inversion    Ankle eversion     (Blank rows = not tested)   FUNCTIONAL TESTS:  2 minute walk test: 275 feet  GAIT: Distance walked:  275 feet Assistive device utilized: Single point cane Level of assistance: Modified independence Comments: 2MWT, antalgic, limited knee flexion/extension ROM    TODAY'S TREATMENT:   02/09/22:   Bike seat 15 rocking with hold at end range Standing: 12in step height knee drive 98X 10" for flexion Hamstring stretch 3x 30" on 12in step Heel/toe raises on slope 15x    Prone: contract/relax 10" holds for flexoin 5x    Quad stretch 3x 30"   Manual scar tissue massage and patella mobs all direction   AROM 2-94 degrees, AAROM to 96 degrees  02/02/22:   Bike seat 15 rocking with hold at end range Standing: 12in step height knee drive 21J 10" for flexion Hamstring stretch 3x 30" on 12in step Heel/toe raises on slope 15x       Prone: contract/relax 10" holds for flexoin 5x    Quad stretch 3x 30"   Manual scar tissue massage and patella mobs all direction   Supine: TKE on 1/2 bolster   AROM 2-103 degrees  01/28/22: Bike seat 15 rocking with hold at end range Standing: 12in step height knee drive 94R 10" for flexion Lt knee hamstring curl 10x 3" Supine Heel slide 10x  PROM 3x 10" for flexion  SKTC 3x 30" Manual scar tissue massage, proximal scat tightness, no scar tissue on distal due to steri strips Prone: contract/relax 10" holds for flexion 3x  Quad stretch with rope 3x 30" AROM initiall at 95 degrees, PROM 97, following SKTC  103 degrees flexion  01/26/22 Nustep 4 minutes LE only seat Standing:  heelraises 15X  Lt knee hamstring curl 10X  Lt knee flexion stretch onto 12" step 10X10" Supine:  heelslides 10X  Manual scar tissue, proximal scar tightest AROM/AAROM for knee flexion following scar massage 92/95 degrees  01/21/22 Bike seat 16 rocking dynamic warm up and ROM Supine heel slides 10x 10 second holds with strap Supine hamstring sets into green ball 10 x 10 second holds Contract relax for flexion 2x 10 5- 10 second holds in seated HR 1x 15  Squat at counter 1x 10 STS from chair with black foam and UE support 1x 10   01/19/22 Quad set 5x 10 second holds Heel slides 10 x 10 second holds with strap SLR 2x 10 with quad set Seated heel slides 5x 10 second holds   PATIENT EDUCATION:  Education details:01/19/22 Patient educated on exam findings, POC, scope of PT, HEP, and LE positioning. 01/21/22 HEP Person educated: Patient Education method: Consulting civil engineer, Demonstration, and Handouts Education comprehension: verbalized understanding, returned demonstration, verbal cues required, and tactile cues required    HOME EXERCISE PROGRAM: 7/11/23Access Code: [place] Five Sitting Quad Set with Towel Roll Under Heel  - 3 x daily - 7 x weekly - 10 reps - 10 second hold - Supine Heel Slide with Strap  - 3-5 x daily - 7 x weekly - 10 reps - 10 second hold - Seated Heel Slide (Mirrored)  - 3-5 x daily - 7 x weekly - 10 reps - 10 second hold - Active Straight Leg Raise with Quad Set (Mirrored)  - 3 x daily - 7 x weekly - 3 sets - 10 reps  ASSESSMENT:  CLINICAL IMPRESSION: Continue focus on improving knee flexion and reducing scar tissue.  Manual continued to address scar tissue restrictions.  Continues to remain tight and restricted periemter of knee and along scar line.  Minimal release obtained with manual and low tolerance to contract relax as well today.  AROM max of 94 degrees today, AAROM to 96 (was to 103  degrees following manual and stretches last visit).  Pt will continue to benefit from skilled therapy to improve ROM and overall function.    Primary focus with knee mobility.  Steri strips have all fallen off.  Manual scar tissue massage to address scar tissue restrictions.  Added patella mobs with good mobility all directions.  Continued with contract/relax for flexion.  AROM 2-103 degrees at EOS.    OBJECTIVE IMPAIRMENTS Abnormal gait, decreased balance, decreased mobility, difficulty walking, decreased ROM, decreased strength, increased edema, impaired flexibility, improper body mechanics, and pain.   ACTIVITY LIMITATIONS carrying, lifting, bending, standing, squatting, stairs, transfers, locomotion level, and caring for others  PARTICIPATION LIMITATIONS: meal prep, cleaning, laundry, driving, shopping, community activity, and yard work  PERSONAL FACTORS Age, Time since onset of injury/illness/exacerbation, and 3+ comorbidities: Hx afib, hx CVA, Hx skin cancer, L THA, Hx lumbar surgery, hx reverse shoulder replacement  are also affecting patient's functional outcome.   REHAB POTENTIAL: Good  CLINICAL DECISION MAKING: Stable/uncomplicated  EVALUATION COMPLEXITY: Low   GOALS: Goals reviewed with patient? Yes  SHORT TERM GOALS: Target date: 02/09/2022  Patient will be independent with HEP in order to improve functional outcomes. Baseline:  Goal status: IN PROGRESS  2.  Patient will report at least 25% improvement in symptoms for improved quality of life. Baseline:  Goal status: IN PROGRESS    LONG TERM GOALS: Target date: 03/02/2022  Patient will report at least 75% improvement in symptoms for improved quality of life. Baseline:  Goal status: IN PROGRESS  2.  Patient will improve FOTO score by at least 13 points in order to indicate improved tolerance to activity. Baseline: 7/11: 65% function Goal status: IN PROGRESS  3.  Patient will be able to navigate stairs with  reciprocal pattern without compensation in order to demonstrate improved LE strength. Baseline:  Goal status: IN PROGRESS  4.  Patient will be able to ambulate at least 400 feet in 2MWT in order to demonstrate improved tolerance to activity. Baseline: 275 feet Goal status: IN PROGRESS  5.  Patient will improve ROM for left knee extension/flexion to 0-115 degrees to improve squatting, and other functional mobility. Baseline: lacking 3 to 62 Goal status: IN PROGRESS  6.  Patient will be able to complete 5x STS in under 11.4 seconds without compensation in order to demonstrate improved functional strength. Baseline: transfers: relies RLE, left knee held in extension, UE use Goal status: IN PROGRESS    PLAN: PT FREQUENCY: 2x/week  PT DURATION: 6 weeks  PLANNED INTERVENTIONS: Therapeutic exercises, Therapeutic activity, Neuromuscular re-education, Balance training, Gait training, Patient/Family education, Joint manipulation, Joint mobilization, Stair training, Orthotic/Fit training, DME instructions, Aquatic Therapy, Dry Needling, Electrical stimulation, Spinal manipulation, Spinal mobilization, Cryotherapy, Moist heat, Compression bandaging, scar mobilization, Splintting, Taping, Traction, Ultrasound, Ionotophoresis '4mg'$ /ml Dexamethasone, and Manual therapy  PLAN FOR NEXT SESSION: left knee mobility with progression to strengthening.  Continue with soft tissue massage to reduce scar tissue and improve ROM.    Teena Irani, PTA/CLT [place] Ph: [phone]  Teena Irani, PTA 02/09/2022, 3:35 PM

## 2022-02-11 ENCOUNTER — Encounter (HOSPITAL_COMMUNITY): Payer: 59 | Admitting: Physical Therapy

## 2022-02-15 ENCOUNTER — Ambulatory Visit (INDEPENDENT_AMBULATORY_CARE_PROVIDER_SITE_OTHER): Payer: 59 | Admitting: Orthopedic Surgery

## 2022-02-15 ENCOUNTER — Encounter: Payer: Self-pay | Admitting: Orthopedic Surgery

## 2022-02-15 DIAGNOSIS — Z96652 Presence of left artificial knee joint: Secondary | ICD-10-CM

## 2022-02-15 NOTE — Progress Notes (Signed)
Post-Op Visit Note   Patient: Keith [name].           Date of Birth: 1941/06/18           MRN: [id number] Visit Date: 02/15/2022 PCP: Celene Squibb, MD   Assessment & Plan:  Chief Complaint:  Chief Complaint  Patient presents with   Left Knee - Routine Post Op   Visit Diagnoses:  1. Status post left knee replacement     Plan: Maxi is an 81 year old patient with left total knee replacement performed 12/31/2021.  On examination he has range of motion 5-95.  Expected amount of swelling in the left leg.  He is on Eliquis.  Cannot get quite around on the bike yet.  No problem with stairs.  Pain is well-controlled.  Plan at this time is to continue with physical therapy working on range of motion.  He is making good progress and has a very committed attitude toward the effort it will take to achieve the result he desires.  Follow-Up Instructions: Return in about 6 weeks (around 03/29/2022).   Orders:  No orders of the defined types were placed in this encounter.  No orders of the defined types were placed in this encounter.   Imaging: No results found.  PMFS History: Patient Active Problem List   Diagnosis Date Noted   Arthritis of left knee    S/P TKR (total knee replacement), left 12/31/2021   Arthritis of left shoulder region    S/P reverse total shoulder arthroplasty, left 09/01/2021   Status post left hip replacement 12/19/2020   Primary osteoarthritis of left hip 12/04/2020   Hyperlipidemia 04/30/2020   Cataract 10/10/2019   Right ureteral stone 10/01/2019   Left ureteral stone 10/01/2019   S/P lumbar fusion 06/15/2019   Acquired hypothyroidism 08/10/2017   Benign prostatic hyperplasia with nocturia 08/10/2017   S/P lumbar laminectomy 06/01/2017   PAF (paroxysmal atrial fibrillation) ([place]) 09/07/2016   S/P AVR 01/18/2014   Hypertension 02/22/2013   Past Medical History:  Diagnosis Date   Arthritis    Atrial fibrillation ([place])    Cardioversion 2016    Atrial flutter ([place])    Cardioversion 2013   Blood transfusion without reported diagnosis    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   Dysrhythmia    GERD (gastroesophageal reflux disease)    History of aortic valve replacement with bioprosthetic valve    Aortic regurgitation   History of chicken pox    History of kidney stones    History of skin cancer    History of stroke 2011   Hypothyroidism    Kidney stone    Seasonal allergies    Stroke [facility])    after heart surgery 2010    Family History  Problem Relation Age of Onset   Colon cancer Neg Hx    Esophageal cancer Neg Hx    Rectal cancer Neg Hx    Stomach cancer Neg Hx     Past Surgical History:  Procedure Laterality Date   AORTIC VALVE REPLACEMENT  2009   Bioprosthetic AVR and root replacement   BACK SURGERY     total of 4 back surgeries   CATARACT EXTRACTION, BILATERAL     COLONOSCOPY     CYSTOSCOPY WITH RETROGRADE PYELOGRAM, URETEROSCOPY AND STENT PLACEMENT Bilateral 10/01/2019   Procedure: CYSTOSCOPY WITH RETROGRADE PYELOGRAM, URETEROSCOPY AND STENT PLACEMENT;  Surgeon: Irine Seal, MD;  [facility];  Service: Urology;  Laterality: Bilateral;  EPIGASTRIC HERNIA REPAIR     HERNIA REPAIR     LUMBAR LAMINECTOMY/DECOMPRESSION MICRODISCECTOMY Right 06/01/2017   Procedure: EXTRAFORAMINAL MICRODISCECTOMY LUMBAR FIVE- SACRAL ONE, RIGHT;  Surgeon: Eustace Moore, MD;  Location: [place];  Service: Neurosurgery;  Laterality: Right;   LUMBAR LAMINECTOMY/DECOMPRESSION MICRODISCECTOMY Bilateral 09/04/2018   Procedure: Laminectomy and Foraminotomy - Lumbar two-three bilateral;  Surgeon: Eustace Moore, MD;  Location: [place];  Service: Neurosurgery;  Laterality: Bilateral;   REVERSE SHOULDER ARTHROPLASTY Left 09/01/2021   Procedure: LEFT REVERSE SHOULDER ARTHROPLASTY;  Surgeon: Meredith Pel, MD;  Location: [place];  Service: Orthopedics;  Laterality: Left;   TOTAL HIP ARTHROPLASTY Left 12/19/2020   Procedure: LEFT TOTAL HIP  ARTHROPLASTY ANTERIOR APPROACH;  Surgeon: Mcarthur Rossetti, MD;  [facility];  Service: Orthopedics;  Laterality: Left;   TOTAL KNEE ARTHROPLASTY Left 12/31/2021   Procedure: TOTAL KNEE ARTHROPLASTY;  Surgeon: Meredith Pel, MD;  Location: [place];  Service: Orthopedics;  Laterality: Left;   TRANSFORAMINAL LUMBAR INTERBODY FUSION (TLIF) WITH PEDICLE SCREW FIXATION 1 LEVEL Left 06/15/2019   Procedure: Decompression and Instrumention Fusion Lumbar Four- Five;  Surgeon: Eustace Moore, MD;  Location: [place];  Service: Neurosurgery;  Laterality: Left;  Decompression and Instrumention Fusion Lumbar Four- Five   Social History   Occupational History   Occupation: Chief Strategy Officer  Tobacco Use   Smoking status: Never   Smokeless tobacco: Never  Vaping Use   Vaping Use: Never used  Substance and Sexual Activity   Alcohol use: Yes    Comment: occasional   Drug use: No   Sexual activity: Yes

## 2022-02-16 ENCOUNTER — Encounter (HOSPITAL_COMMUNITY): Payer: 59 | Admitting: Physical Therapy

## 2022-02-18 ENCOUNTER — Encounter (HOSPITAL_COMMUNITY): Payer: 59 | Admitting: Physical Therapy

## 2022-02-23 ENCOUNTER — Encounter (HOSPITAL_COMMUNITY): Payer: 59 | Admitting: Physical Therapy

## 2022-02-24 ENCOUNTER — Ambulatory Visit (HOSPITAL_COMMUNITY): Payer: 59

## 2022-02-24 DIAGNOSIS — M25512 Pain in left shoulder: Secondary | ICD-10-CM | POA: Diagnosis not present

## 2022-02-24 DIAGNOSIS — R29898 Other symptoms and signs involving the musculoskeletal system: Secondary | ICD-10-CM | POA: Diagnosis not present

## 2022-02-24 DIAGNOSIS — M25562 Pain in left knee: Secondary | ICD-10-CM

## 2022-02-24 DIAGNOSIS — M6281 Muscle weakness (generalized): Secondary | ICD-10-CM

## 2022-02-24 DIAGNOSIS — M25662 Stiffness of left knee, not elsewhere classified: Secondary | ICD-10-CM

## 2022-02-24 DIAGNOSIS — R2689 Other abnormalities of gait and mobility: Secondary | ICD-10-CM | POA: Diagnosis not present

## 2022-02-24 DIAGNOSIS — M25612 Stiffness of left shoulder, not elsewhere classified: Secondary | ICD-10-CM | POA: Diagnosis not present

## 2022-02-24 NOTE — Therapy (Signed)
OUTPATIENT PHYSICAL THERAPY TREATMENT   Patient Name: Keith [name]. MRN: [id number] DOB:[birth year], 81 y.o., male Today's Date: 02/24/2022   PT End of Session - 02/24/22 1601     Visit Number 7    Number of Visits 12    Date for PT Re-Evaluation 03/02/22    Authorization Type Primary [employer](auth after 25th visit) Secondary: Medicare    Progress Note Due on Visit 10    PT Start Time 1601    PT Stop Time 1644    PT Time Calculation (min) 43 min    Activity Tolerance Patient tolerated treatment well    Behavior During Therapy [facility] for tasks assessed/performed                 Past Medical History:  Diagnosis Date   Arthritis    Atrial fibrillation ([place])    Cardioversion 2016   Atrial flutter ([place])    Cardioversion 2013   Blood transfusion without reported diagnosis    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   Dysrhythmia    GERD (gastroesophageal reflux disease)    History of aortic valve replacement with bioprosthetic valve    Aortic regurgitation   History of chicken pox    History of kidney stones    History of skin cancer    History of stroke 2011   Hypothyroidism    Kidney stone    Seasonal allergies    Stroke [facility])    after heart surgery 2010   Past Surgical History:  Procedure Laterality Date   AORTIC VALVE REPLACEMENT  2009   Bioprosthetic AVR and root replacement   BACK SURGERY     total of 4 back surgeries   CATARACT EXTRACTION, BILATERAL     COLONOSCOPY     CYSTOSCOPY WITH RETROGRADE PYELOGRAM, URETEROSCOPY AND STENT PLACEMENT Bilateral 10/01/2019   Procedure: CYSTOSCOPY WITH RETROGRADE PYELOGRAM, URETEROSCOPY AND STENT PLACEMENT;  Surgeon: Irine Seal, MD;  Location: [facility];  Service: Urology;  Laterality: Bilateral;   EPIGASTRIC HERNIA REPAIR     HERNIA REPAIR     LUMBAR LAMINECTOMY/DECOMPRESSION MICRODISCECTOMY Right 06/01/2017   Procedure: EXTRAFORAMINAL MICRODISCECTOMY LUMBAR FIVE- SACRAL ONE, RIGHT;  Surgeon: Eustace Moore,  MD;  Location: [place];  Service: Neurosurgery;  Laterality: Right;   LUMBAR LAMINECTOMY/DECOMPRESSION MICRODISCECTOMY Bilateral 09/04/2018   Procedure: Laminectomy and Foraminotomy - Lumbar two-three bilateral;  Surgeon: Eustace Moore, MD;  Location: [place];  Service: Neurosurgery;  Laterality: Bilateral;   REVERSE SHOULDER ARTHROPLASTY Left 09/01/2021   Procedure: LEFT REVERSE SHOULDER ARTHROPLASTY;  Surgeon: Meredith Pel, MD;  Location: [place];  Service: Orthopedics;  Laterality: Left;   TOTAL HIP ARTHROPLASTY Left 12/19/2020   Procedure: LEFT TOTAL HIP ARTHROPLASTY ANTERIOR APPROACH;  Surgeon: Mcarthur Rossetti, MD;  Location: [facility];  Service: Orthopedics;  Laterality: Left;   TOTAL KNEE ARTHROPLASTY Left 12/31/2021   Procedure: TOTAL KNEE ARTHROPLASTY;  Surgeon: Meredith Pel, MD;  Location: [place];  Service: Orthopedics;  Laterality: Left;   TRANSFORAMINAL LUMBAR INTERBODY FUSION (TLIF) WITH PEDICLE SCREW FIXATION 1 LEVEL Left 06/15/2019   Procedure: Decompression and Instrumention Fusion Lumbar Four- Five;  Surgeon: Eustace Moore, MD;  Location: [place];  Service: Neurosurgery;  Laterality: Left;  Decompression and Instrumention Fusion Lumbar Four- Five   Patient Active Problem List   Diagnosis Date Noted   Arthritis of left knee    S/P TKR (total knee replacement), left 12/31/2021   Arthritis of left shoulder region  S/P reverse total shoulder arthroplasty, left 09/01/2021   Status post left hip replacement 12/19/2020   Primary osteoarthritis of left hip 12/04/2020   Hyperlipidemia 04/30/2020   Cataract 10/10/2019   Right ureteral stone 10/01/2019   Left ureteral stone 10/01/2019   S/P lumbar fusion 06/15/2019   Acquired hypothyroidism 08/10/2017   Benign prostatic hyperplasia with nocturia 08/10/2017   S/P lumbar laminectomy 06/01/2017   PAF (paroxysmal atrial fibrillation) ([place]) 09/07/2016   S/P AVR 01/18/2014   Hypertension 02/22/2013    PCP: Celene Squibb  MD  REFERRING PROVIDER: Meredith Pel, MD   REFERRING DIAG: [phone] (ICD-10-CM) - Status post left knee replacement   THERAPY DIAG:  Left knee pain, unspecified chronicity  Stiffness of left knee, not elsewhere classified  Muscle weakness (generalized)  Other abnormalities of gait and mobility  Other symptoms and signs involving the musculoskeletal system  Acute pain of left shoulder  Stiffness of left shoulder, not elsewhere classified  Rationale for Evaluation and Treatment Rehabilitation  ONSET DATE: 12/31/21   SUBJECTIVE:   SUBJECTIVE STATEMENT: Had a good report from the doctor last week; no pain compliant today  PERTINENT HISTORY: S/P left reverse shoulder arthroplasty completed on 09/01/21 by Dr. Marlou Sa., Hx afib, hx CVA, Hx skin cancer, L THA, Hx lumbar surgery  PAIN:  Are you having pain? Yes: NPRS scale: 0/10 Pain location: L knee  Pain description: sore Aggravating factors: exercise Relieving factors: rest  PRECAUTIONS: None  WEIGHT BEARING RESTRICTIONS No  FALLS:  Has patient fallen in last 6 months? 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   OBJECTIVE:   Observation:  L knee held in extension, Relies on RLE for transfers  PATIENT SURVEYS:  FOTO 65% function  COGNITION:  Overall cognitive status: Within functional limits for tasks assessed     SENSATION: WFL   POSTURE: rounded shoulders and forward head  PALPATION: TTP L quads  LOWER EXTREMITY ROM:  Active ROM Right eval Left eval Left  01/21/22 Left 01/26/22 Left 02/02/22 Left 02/09/22  Hip flexion        Hip extension        Hip abduction        Hip adduction        Hip internal rotation        Hip external  rotation        Knee flexion  62 83 improves to 88 with heel slides and 92 following contract relax 92 After Scar mass AROM 102 AROM 94 AAROM 96  Knee extension  Lacking 3 Lacking 2 -2 2   Ankle dorsiflexion        Ankle plantarflexion        Ankle inversion        Ankle eversion         (Blank rows = not tested)  LOWER EXTREMITY MMT:  MMT Right eval Left eval  Hip flexion 5 5  Hip extension    Hip abduction    Hip adduction    Hip internal rotation    Hip external rotation    Knee flexion 5 4+  Knee extension 5 4+  Ankle dorsiflexion 5 5  Ankle plantarflexion    Ankle inversion    Ankle eversion     (  Blank rows = not tested)   FUNCTIONAL TESTS:  2 minute walk test: 275 feet  GAIT: Distance walked: 275 feet Assistive device utilized: Single point cane Level of assistance: Modified independence Comments: 2MWT, antalgic, limited knee flexion/extension ROM    TODAY'S TREATMENT: 02/24/22  Bike seat 16 rocking with hold at end range x 5'   Manual STM to decrease pain and swelling; patella mobs and manuel knee flexion sitting and extension in supine x 12' no other treatment performed during manual treatment  Bike again seat 16 2 more minutes able to make full backward revolutions  Standing:   heel raises x 20 Slant board 3 x 30" Knee drives on 8" box x 10 8" step ups x 10         02/09/22:   Bike seat 15 rocking with hold at end range Standing: 12in step height knee drive 47W 10" for flexion Hamstring stretch 3x 30" on 12in step Heel/toe raises on slope 15x    Prone: contract/relax 10" holds for flexoin 5x    Quad stretch 3x 30"   Manual scar tissue massage and patella mobs all direction   AROM 2-94 degrees, AAROM to 96 degrees  02/02/22:   Bike seat 15 rocking with hold at end range Standing: 12in step height knee drive 29F 10" for flexion Hamstring stretch 3x 30" on 12in step Heel/toe raises on slope 15x       Prone: contract/relax 10" holds for  flexoin 5x    Quad stretch 3x 30"   Manual scar tissue massage and patella mobs all direction   Supine: TKE on 1/2 bolster   AROM 2-103 degrees  01/28/22: Bike seat 15 rocking with hold at end range Standing: 12in step height knee drive 62Z 10" for flexion Lt knee hamstring curl 10x 3" Supine Heel slide 10x  PROM 3x 10" for flexion  SKTC 3x 30" Manual scar tissue massage, proximal scat tightness, no scar tissue on distal due to steri strips Prone: contract/relax 10" holds for flexion 3x  Quad stretch with rope 3x 30" AROM initiall at 95 degrees, PROM 97, following SKTC 103 degrees flexion  01/26/22 Nustep 4 minutes LE only seat Standing:  heelraises 15X  Lt knee hamstring curl 10X  Lt knee flexion stretch onto 12" step 10X10" Supine:  heelslides 10X  Manual scar tissue, proximal scar tightest AROM/AAROM for knee flexion following scar massage 92/95 degrees  01/21/22 Bike seat 16 rocking dynamic warm up and ROM Supine heel slides 10x 10 second holds with strap Supine hamstring sets into green ball 10 x 10 second holds Contract relax for flexion 2x 10 5- 10 second holds in seated HR 1x 15  Squat at counter 1x 10 STS from chair with black foam and UE support 1x 10   01/19/22 Quad set 5x 10 second holds Heel slides 10 x 10 second holds with strap SLR 2x 10 with quad set Seated heel slides 5x 10 second holds   PATIENT EDUCATION:  Education details:01/19/22 Patient educated on exam findings, POC, scope of PT, HEP, and LE positioning. 01/21/22 HEP Person educated: Patient Education method: Consulting civil engineer, Demonstration, and Handouts Education comprehension: verbalized understanding, returned demonstration, verbal cues required, and tactile cues required    HOME EXERCISE PROGRAM: 7/11/23Access Code: [place] Sitting Quad Set with Towel Roll Under Heel  - 3 x daily - 7 x weekly - 10 reps - 10 second hold - Supine Heel Slide with Strap  - 3-5 x daily - 7  x weekly - 10 reps -  10 second hold - Seated Heel Slide (Mirrored)  - 3-5 x daily - 7 x weekly - 10 reps - 10 second hold - Active Straight Leg Raise with Quad Set (Mirrored)  - 3 x daily - 7 x weekly - 3 sets - 10 reps  ASSESSMENT:  CLINICAL IMPRESSION: Today's session continued to focus on left knee mobility and strength.  Patient able to make full revolution on bike backwards today.  Continues with swelling of the knee; extension lag and tightness with knee flexion.  Works hard in therapy. Added step ups to treatment today.  Patient will benefit from continued skilled therapy services to address deficits and promote optimal function.  OBJECTIVE IMPAIRMENTS Abnormal gait, decreased balance, decreased mobility, difficulty walking, decreased ROM, decreased strength, increased edema, impaired flexibility, improper body mechanics, and pain.   ACTIVITY LIMITATIONS carrying, lifting, bending, standing, squatting, stairs, transfers, locomotion level, and caring for others  PARTICIPATION LIMITATIONS: meal prep, cleaning, laundry, driving, shopping, community activity, and yard work  PERSONAL FACTORS Age, Time since onset of injury/illness/exacerbation, and 3+ comorbidities: Hx afib, hx CVA, Hx skin cancer, L THA, Hx lumbar surgery, hx reverse shoulder replacement  are also affecting patient's functional outcome.   REHAB POTENTIAL: Good  CLINICAL DECISION MAKING: Stable/uncomplicated  EVALUATION COMPLEXITY: Low   GOALS: Goals reviewed with patient? Yes  SHORT TERM GOALS: Target date: 02/09/2022  Patient will be independent with HEP in order to improve functional outcomes. Baseline:  Goal status: IN PROGRESS  2.  Patient will report at least 25% improvement in symptoms for improved quality of life. Baseline:  Goal status: IN PROGRESS    LONG TERM GOALS: Target date: 03/02/2022  Patient will report at least 75% improvement in symptoms for improved quality of life. Baseline:  Goal status: IN PROGRESS  2.   Patient will improve FOTO score by at least 13 points in order to indicate improved tolerance to activity. Baseline: 7/11: 65% function Goal status: IN PROGRESS  3.  Patient will be able to navigate stairs with reciprocal pattern without compensation in order to demonstrate improved LE strength. Baseline:  Goal status: IN PROGRESS  4.  Patient will be able to ambulate at least 400 feet in 2MWT in order to demonstrate improved tolerance to activity. Baseline: 275 feet Goal status: IN PROGRESS  5.  Patient will improve ROM for left knee extension/flexion to 0-115 degrees to improve squatting, and other functional mobility. Baseline: lacking 3 to 62 Goal status: IN PROGRESS  6.  Patient will be able to complete 5x STS in under 11.4 seconds without compensation in order to demonstrate improved functional strength. Baseline: transfers: relies RLE, left knee held in extension, UE use Goal status: IN PROGRESS    PLAN: PT FREQUENCY: 2x/week  PT DURATION: 6 weeks  PLANNED INTERVENTIONS: Therapeutic exercises, Therapeutic activity, Neuromuscular re-education, Balance training, Gait training, Patient/Family education, Joint manipulation, Joint mobilization, Stair training, Orthotic/Fit training, DME instructions, Aquatic Therapy, Dry Needling, Electrical stimulation, Spinal manipulation, Spinal mobilization, Cryotherapy, Moist heat, Compression bandaging, scar mobilization, Splintting, Taping, Traction, Ultrasound, Ionotophoresis '4mg'$ /ml Dexamethasone, and Manual therapy  PLAN FOR NEXT SESSION: left knee mobility with progression to strengthening.  Continue with soft tissue massage to reduce scar tissue and improve ROM.    4:02 PM, 02/24/22 Razia Screws Small Iasiah Ozment MPT [place] [facility] [place] [phone] MV:[phone]

## 2022-02-25 ENCOUNTER — Encounter (HOSPITAL_COMMUNITY): Payer: 59 | Admitting: Physical Therapy

## 2022-02-28 ENCOUNTER — Other Ambulatory Visit: Payer: Self-pay | Admitting: Orthopedic Surgery

## 2022-03-01 ENCOUNTER — Other Ambulatory Visit (HOSPITAL_COMMUNITY): Payer: Self-pay

## 2022-03-01 ENCOUNTER — Ambulatory Visit (HOSPITAL_COMMUNITY): Payer: 59

## 2022-03-01 DIAGNOSIS — R29898 Other symptoms and signs involving the musculoskeletal system: Secondary | ICD-10-CM | POA: Diagnosis not present

## 2022-03-01 DIAGNOSIS — M6281 Muscle weakness (generalized): Secondary | ICD-10-CM

## 2022-03-01 DIAGNOSIS — M25612 Stiffness of left shoulder, not elsewhere classified: Secondary | ICD-10-CM | POA: Diagnosis not present

## 2022-03-01 DIAGNOSIS — M25662 Stiffness of left knee, not elsewhere classified: Secondary | ICD-10-CM

## 2022-03-01 DIAGNOSIS — N200 Calculus of kidney: Secondary | ICD-10-CM | POA: Diagnosis not present

## 2022-03-01 DIAGNOSIS — R2689 Other abnormalities of gait and mobility: Secondary | ICD-10-CM

## 2022-03-01 DIAGNOSIS — M25562 Pain in left knee: Secondary | ICD-10-CM

## 2022-03-01 DIAGNOSIS — R351 Nocturia: Secondary | ICD-10-CM | POA: Diagnosis not present

## 2022-03-01 DIAGNOSIS — M25512 Pain in left shoulder: Secondary | ICD-10-CM | POA: Diagnosis not present

## 2022-03-01 DIAGNOSIS — N401 Enlarged prostate with lower urinary tract symptoms: Secondary | ICD-10-CM | POA: Diagnosis not present

## 2022-03-01 MED ORDER — TAMSULOSIN HCL 0.4 MG PO CAPS
0.4000 mg | ORAL_CAPSULE | Freq: Two times a day (BID) | ORAL | 3 refills | Status: DC
  Filled 2022-04-16: qty 180, 90d supply, fill #0
  Filled 2022-11-18: qty 180, 90d supply, fill #1

## 2022-03-01 NOTE — Therapy (Signed)
OUTPATIENT PHYSICAL THERAPY TREATMENT   Patient Name: Keith [name]. MRN: [id number] DOB:1941/06/14, 81 y.o., male Today's Date: 03/01/2022   PT End of Session - 03/01/22 0834     Visit Number 8    Number of Visits 12    Date for PT Re-Evaluation 03/02/22    Authorization Type Primary [employer](auth after 25th visit) Secondary: Medicare    Progress Note Due on Visit 10    PT Start Time 0813    PT Stop Time 0855    PT Time Calculation (min) 42 min    Activity Tolerance Patient tolerated treatment well    Behavior During Therapy [facility] for tasks assessed/performed                  Past Medical History:  Diagnosis Date   Arthritis    Atrial fibrillation ([place])    Cardioversion 2016   Atrial flutter ([place])    Cardioversion 2013   Blood transfusion without reported diagnosis    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   Dysrhythmia    GERD (gastroesophageal reflux disease)    History of aortic valve replacement with bioprosthetic valve    Aortic regurgitation   History of chicken pox    History of kidney stones    History of skin cancer    History of stroke 2011   Hypothyroidism    Kidney stone    Seasonal allergies    Stroke [facility])    after heart surgery 2010   Past Surgical History:  Procedure Laterality Date   AORTIC VALVE REPLACEMENT  2009   Bioprosthetic AVR and root replacement   BACK SURGERY     total of 4 back surgeries   CATARACT EXTRACTION, BILATERAL     COLONOSCOPY     CYSTOSCOPY WITH RETROGRADE PYELOGRAM, URETEROSCOPY AND STENT PLACEMENT Bilateral 10/01/2019   Procedure: CYSTOSCOPY WITH RETROGRADE PYELOGRAM, URETEROSCOPY AND STENT PLACEMENT;  Surgeon: Irine Seal, MD;  Location: [facility];  Service: Urology;  Laterality: Bilateral;   EPIGASTRIC HERNIA REPAIR     HERNIA REPAIR     LUMBAR LAMINECTOMY/DECOMPRESSION MICRODISCECTOMY Right 06/01/2017   Procedure: EXTRAFORAMINAL MICRODISCECTOMY LUMBAR FIVE- SACRAL ONE, RIGHT;  Surgeon: Eustace Moore, MD;  Location: [place];  Service: Neurosurgery;  Laterality: Right;   LUMBAR LAMINECTOMY/DECOMPRESSION MICRODISCECTOMY Bilateral 09/04/2018   Procedure: Laminectomy and Foraminotomy - Lumbar two-three bilateral;  Surgeon: Eustace Moore, MD;  Location: [place];  Service: Neurosurgery;  Laterality: Bilateral;   REVERSE SHOULDER ARTHROPLASTY Left 09/01/2021   Procedure: LEFT REVERSE SHOULDER ARTHROPLASTY;  Surgeon: Meredith Pel, MD;  Location: [place];  Service: Orthopedics;  Laterality: Left;   TOTAL HIP ARTHROPLASTY Left 12/19/2020   Procedure: LEFT TOTAL HIP ARTHROPLASTY ANTERIOR APPROACH;  Surgeon: Mcarthur Rossetti, MD;  Location: [facility];  Service: Orthopedics;  Laterality: Left;   TOTAL KNEE ARTHROPLASTY Left 12/31/2021   Procedure: TOTAL KNEE ARTHROPLASTY;  Surgeon: Meredith Pel, MD;  Location: [place];  Service: Orthopedics;  Laterality: Left;   TRANSFORAMINAL LUMBAR INTERBODY FUSION (TLIF) WITH PEDICLE SCREW FIXATION 1 LEVEL Left 06/15/2019   Procedure: Decompression and Instrumention Fusion Lumbar Four- Five;  Surgeon: Eustace Moore, MD;  Location: [place];  Service: Neurosurgery;  Laterality: Left;  Decompression and Instrumention Fusion Lumbar Four- Five   Patient Active Problem List   Diagnosis Date Noted   Arthritis of left knee    S/P TKR (total knee replacement), left 12/31/2021   Arthritis of left shoulder region  S/P reverse total shoulder arthroplasty, left 09/01/2021   Status post left hip replacement 12/19/2020   Primary osteoarthritis of left hip 12/04/2020   Hyperlipidemia 04/30/2020   Cataract 10/10/2019   Right ureteral stone 10/01/2019   Left ureteral stone 10/01/2019   S/P lumbar fusion 06/15/2019   Acquired hypothyroidism 08/10/2017   Benign prostatic hyperplasia with nocturia 08/10/2017   S/P lumbar laminectomy 06/01/2017   PAF (paroxysmal atrial fibrillation) ([place]) 09/07/2016   S/P AVR 01/18/2014   Hypertension 02/22/2013    PCP: Celene Squibb  MD  REFERRING PROVIDER: Meredith Pel, MD   REFERRING DIAG: [phone] (ICD-10-CM) - Status post left knee replacement   THERAPY DIAG:  Left knee pain, unspecified chronicity  Stiffness of left knee, not elsewhere classified  Muscle weakness (generalized)  Other abnormalities of gait and mobility  Other symptoms and signs involving the musculoskeletal system  Acute pain of left shoulder  Stiffness of left shoulder, not elsewhere classified  Rationale for Evaluation and Treatment Rehabilitation  ONSET DATE: 12/31/21   SUBJECTIVE:   SUBJECTIVE STATEMENT: Patient reports stiffness in the knee but no pain this morning. He is going out of town at the end of the week for a few days. States he is not doing any exercises at home.   PERTINENT HISTORY: S/P left reverse shoulder arthroplasty completed on 09/01/21 by Dr. Marlou Sa., Hx afib, hx CVA, Hx skin cancer, L THA, Hx lumbar surgery  PAIN:  Are you having pain? Yes: NPRS scale: 0/10 Pain location: L knee  Pain description: sore Aggravating factors: exercise Relieving factors: rest  PRECAUTIONS: None  WEIGHT BEARING RESTRICTIONS No  FALLS:  Has patient fallen in last 6 months? 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   OBJECTIVE:   Observation:  L knee held in extension, Relies on RLE for transfers  PATIENT SURVEYS:  FOTO 65% function  COGNITION:  Overall cognitive status: Within functional limits for tasks assessed     SENSATION: WFL   POSTURE: rounded shoulders and forward head  PALPATION: TTP L quads  LOWER EXTREMITY ROM:  Active ROM Right eval Left eval Left  01/21/22 Left 01/26/22 Left 02/02/22 Left 02/09/22 Left 03/01/22  Hip flexion          Hip extension         Hip abduction         Hip adduction         Hip internal rotation         Hip external rotation         Knee flexion  62 83 improves to 88 with heel slides and 92 following contract relax 92 After Scar mass AROM 102 AROM 94 AAROM 96 AROM su0pine 105  Knee extension  Lacking 3 Lacking 2 -2 2  -6  Ankle dorsiflexion         Ankle plantarflexion         Ankle inversion         Ankle eversion          (Blank rows = not tested)  LOWER EXTREMITY MMT:  MMT Right eval Left eval  Hip flexion 5 5  Hip extension    Hip abduction    Hip adduction  Hip internal rotation    Hip external rotation    Knee flexion 5 4+  Knee extension 5 4+  Ankle dorsiflexion 5 5  Ankle plantarflexion    Ankle inversion    Ankle eversion     (Blank rows = not tested)   FUNCTIONAL TESTS:  2 minute walk test: 275 feet  GAIT: Distance walked: 275 feet Assistive device utilized: Single point cane Level of assistance: Modified independence Comments: 2MWT, antalgic, limited knee flexion/extension ROM    TODAY'S TREATMENT: 03/01/20 Bike seat 16 x 5' 1/2 revolutions  Manual STM to decrease pain and swelling; patella mobs and manuel knee flexion extension in supine x 10' no other treatment performed during manual treatment  Supine:  Quad sets 5" x 10  Left knee ext -6, knee flexion 105 AROM supine today  Bike again seat 17 x 5' full revolutions x 5  Standing: Heel/ toe raises x 20 Slant board 3 x 20" Knee drives on 8" box x '1\' 8"'$  lateral step ups x 10 TKE's GTB x 20          02/24/22  Bike seat 16 rocking with hold at end range x 5'   Manual STM to decrease pain and swelling; patella mobs and manuel knee flexion sitting and extension in supine x 12' no other treatment performed during manual treatment  Bike again seat 16 2 more minutes able to make full backward revolutions  Standing:   heel raises x 20 Slant board 3 x 30" Knee drives on 8" box x  10 8" step ups x 10          02/09/22:   Bike seat 15 rocking with hold at end range Standing: 12in step height knee drive 78E 10" for flexion Hamstring stretch 3x 30" on 12in step Heel/toe raises on slope 15x    Prone: contract/relax 10" holds for flexoin 5x    Quad stretch 3x 30"   Manual scar tissue massage and patella mobs all direction   AROM 2-94 degrees, AAROM to 96 degrees  02/02/22:   Bike seat 15 rocking with hold at end range Standing: 12in step height knee drive 42P 10" for flexion Hamstring stretch 3x 30" on 12in step Heel/toe raises on slope 15x       Prone: contract/relax 10" holds for flexoin 5x    Quad stretch 3x 30"   Manual scar tissue massage and patella mobs all direction   Supine: TKE on 1/2 bolster   AROM 2-103 degrees  01/28/22: Bike seat 15 rocking with hold at end range Standing: 12in step height knee drive 53I 10" for flexion Lt knee hamstring curl 10x 3" Supine Heel slide 10x  PROM 3x 10" for flexion  SKTC 3x 30" Manual scar tissue massage, proximal scat tightness, no scar tissue on distal due to steri strips Prone: contract/relax 10" holds for flexion 3x  Quad stretch with rope 3x 30" AROM initiall at 95 degrees, PROM 97, following SKTC 103 degrees flexion  01/26/22 Nustep 4 minutes LE only seat Standing:  heelraises 15X  Lt knee hamstring curl 10X  Lt knee flexion stretch onto 12" step 10X10" Supine:  heelslides 10X  Manual scar tissue, proximal scar tightest AROM/AAROM for knee flexion following scar massage 92/95 degrees  01/21/22 Bike seat 16 rocking dynamic warm up and ROM Supine heel slides 10x 10 second holds with strap Supine hamstring sets into green ball 10 x 10 second holds Contract relax for flexion 2x 10 5- 10 second  holds in seated HR 1x 15  Squat at counter 1x 10 STS from chair with black foam and UE support 1x 10   01/19/22 Quad set 5x 10 second holds Heel slides 10 x 10 second holds with strap SLR 2x 10 with quad  set Seated heel slides 5x 10 second holds   PATIENT EDUCATION:  Education details:01/19/22 Patient educated on exam findings, POC, scope of PT, HEP, and LE positioning. 01/21/22 HEP Person educated: Patient Education method: Consulting civil engineer, Demonstration, and Handouts Education comprehension: verbalized understanding, returned demonstration, verbal cues required, and tactile cues required    HOME EXERCISE PROGRAM: 7/11/23Access Code: [place] Sitting Quad Set with Towel Roll Under Heel  - 3 x daily - 7 x weekly - 10 reps - 10 second hold - Supine Heel Slide with Strap  - 3-5 x daily - 7 x weekly - 10 reps - 10 second hold - Seated Heel Slide (Mirrored)  - 3-5 x daily - 7 x weekly - 10 reps - 10 second hold - Active Straight Leg Raise with Quad Set (Mirrored)  - 3 x daily - 7 x weekly - 3 sets - 10 reps  ASSESSMENT:  CLINICAL IMPRESSION: Today's session continued to address left knee mobility and strength.  He states he is not doing any exercise at home and so therapist instructs him in the importance of HEP in order for knee to promote optimal function of the knee. Patient guards that left knee with trying to make revolutions on the bike. Added lateral step ups today patient tends to use upper extremities to assist quite a bit. Added TKE to promote full knee extension strengthening.  Patient will benefit from continued skilled therapy services to address deficits and promote optimal function.   OBJECTIVE IMPAIRMENTS Abnormal gait, decreased balance, decreased mobility, difficulty walking, decreased ROM, decreased strength, increased edema, impaired flexibility, improper body mechanics, and pain.   ACTIVITY LIMITATIONS carrying, lifting, bending, standing, squatting, stairs, transfers, locomotion level, and caring for others  PARTICIPATION LIMITATIONS: meal prep, cleaning, laundry, driving, shopping, community activity, and yard work  PERSONAL FACTORS Age, Time since onset of  injury/illness/exacerbation, and 3+ comorbidities: Hx afib, hx CVA, Hx skin cancer, L THA, Hx lumbar surgery, hx reverse shoulder replacement  are also affecting patient's functional outcome.   REHAB POTENTIAL: Good  CLINICAL DECISION MAKING: Stable/uncomplicated  EVALUATION COMPLEXITY: Low   GOALS: Goals reviewed with patient? Yes  SHORT TERM GOALS: Target date: 02/09/2022  Patient will be independent with HEP in order to improve functional outcomes. Baseline:  Goal status: IN PROGRESS  2.  Patient will report at least 25% improvement in symptoms for improved quality of life. Baseline:  Goal status: IN PROGRESS    LONG TERM GOALS: Target date: 03/02/2022  Patient will report at least 75% improvement in symptoms for improved quality of life. Baseline:  Goal status: IN PROGRESS  2.  Patient will improve FOTO score by at least 13 points in order to indicate improved tolerance to activity. Baseline: 7/11: 65% function Goal status: IN PROGRESS  3.  Patient will be able to navigate stairs with reciprocal pattern without compensation in order to demonstrate improved LE strength. Baseline:  Goal status: IN PROGRESS  4.  Patient will be able to ambulate at least 400 feet in 2MWT in order to demonstrate improved tolerance to activity. Baseline: 275 feet Goal status: IN PROGRESS  5.  Patient will improve ROM for left knee extension/flexion to 0-115 degrees to improve squatting, and other  functional mobility. Baseline: lacking 3 to 62 Goal status: IN PROGRESS  6.  Patient will be able to complete 5x STS in under 11.4 seconds without compensation in order to demonstrate improved functional strength. Baseline: transfers: relies RLE, left knee held in extension, UE use Goal status: IN PROGRESS    PLAN: PT FREQUENCY: 2x/week  PT DURATION: 6 weeks  PLANNED INTERVENTIONS: Therapeutic exercises, Therapeutic activity, Neuromuscular re-education, Balance training, Gait training,  Patient/Family education, Joint manipulation, Joint mobilization, Stair training, Orthotic/Fit training, DME instructions, Aquatic Therapy, Dry Needling, Electrical stimulation, Spinal manipulation, Spinal mobilization, Cryotherapy, Moist heat, Compression bandaging, scar mobilization, Splintting, Taping, Traction, Ultrasound, Ionotophoresis '4mg'$ /ml Dexamethasone, and Manual therapy  PLAN FOR NEXT SESSION: left knee mobility with progression to strengthening.  Continue with soft tissue massage to reduce scar tissue and improve ROM.  Patient going out of town the end of this week will return end of next week.  8:57 AM, 03/01/22 Rosaisela Jamroz [place] MPT [facility] [place] [phone]

## 2022-03-02 ENCOUNTER — Encounter (HOSPITAL_COMMUNITY): Payer: 59 | Admitting: Physical Therapy

## 2022-03-02 MED ORDER — OXYCODONE HCL 5 MG PO TABS
5.0000 mg | ORAL_TABLET | Freq: Two times a day (BID) | ORAL | 0 refills | Status: DC | PRN
  Filled 2022-03-02: qty 30, 15d supply, fill #0

## 2022-03-02 MED ORDER — METHOCARBAMOL 500 MG PO TABS
500.0000 mg | ORAL_TABLET | Freq: Four times a day (QID) | ORAL | 0 refills | Status: DC | PRN
  Filled 2022-03-02: qty 30, 8d supply, fill #0

## 2022-03-03 ENCOUNTER — Other Ambulatory Visit (HOSPITAL_COMMUNITY): Payer: Self-pay

## 2022-03-04 ENCOUNTER — Encounter (HOSPITAL_COMMUNITY): Payer: 59 | Admitting: Physical Therapy

## 2022-03-04 ENCOUNTER — Encounter (HOSPITAL_COMMUNITY): Payer: 59

## 2022-03-04 ENCOUNTER — Other Ambulatory Visit (HOSPITAL_COMMUNITY): Payer: Self-pay

## 2022-03-08 ENCOUNTER — Encounter (HOSPITAL_COMMUNITY): Payer: 59

## 2022-03-11 ENCOUNTER — Ambulatory Visit (HOSPITAL_COMMUNITY): Payer: 59

## 2022-03-11 DIAGNOSIS — R29898 Other symptoms and signs involving the musculoskeletal system: Secondary | ICD-10-CM | POA: Diagnosis not present

## 2022-03-11 DIAGNOSIS — M25612 Stiffness of left shoulder, not elsewhere classified: Secondary | ICD-10-CM

## 2022-03-11 DIAGNOSIS — M25662 Stiffness of left knee, not elsewhere classified: Secondary | ICD-10-CM | POA: Diagnosis not present

## 2022-03-11 DIAGNOSIS — M25512 Pain in left shoulder: Secondary | ICD-10-CM | POA: Diagnosis not present

## 2022-03-11 DIAGNOSIS — M25562 Pain in left knee: Secondary | ICD-10-CM

## 2022-03-11 DIAGNOSIS — M6281 Muscle weakness (generalized): Secondary | ICD-10-CM

## 2022-03-11 DIAGNOSIS — R2689 Other abnormalities of gait and mobility: Secondary | ICD-10-CM

## 2022-03-11 NOTE — Therapy (Signed)
OUTPATIENT PHYSICAL THERAPY PROGRESS NOTE Progress Note Reporting Period 01/19/2022 to 03/11/2022  See note below for Objective Data and Assessment of Progress/Goals.       Patient Name: Keith [name]. MRN: [id number] DOB:1940/08/01, 81 y.o., male Today's Date: 03/11/2022   PT End of Session - 03/11/22 0825     Visit Number 9    Number of Visits 12    Date for PT Re-Evaluation 03/02/22    Authorization Type Primary [employer](auth after 25th visit) Secondary: Medicare    Progress Note Due on Visit 10    PT Start Time 0810    PT Stop Time 0850    PT Time Calculation (min) 40 min    Activity Tolerance Patient tolerated treatment well    Behavior During Therapy [facility] for tasks assessed/performed                   Past Medical History:  Diagnosis Date   Arthritis    Atrial fibrillation ([place])    Cardioversion 2016   Atrial flutter ([place])    Cardioversion 2013   Blood transfusion without reported diagnosis    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   Dysrhythmia    GERD (gastroesophageal reflux disease)    History of aortic valve replacement with bioprosthetic valve    Aortic regurgitation   History of chicken pox    History of kidney stones    History of skin cancer    History of stroke 2011   Hypothyroidism    Kidney stone    Seasonal allergies    Stroke [facility])    after heart surgery 2010   Past Surgical History:  Procedure Laterality Date   AORTIC VALVE REPLACEMENT  2009   Bioprosthetic AVR and root replacement   BACK SURGERY     total of 4 back surgeries   CATARACT EXTRACTION, BILATERAL     COLONOSCOPY     CYSTOSCOPY WITH RETROGRADE PYELOGRAM, URETEROSCOPY AND STENT PLACEMENT Bilateral 10/01/2019   Procedure: CYSTOSCOPY WITH RETROGRADE PYELOGRAM, URETEROSCOPY AND STENT PLACEMENT;  Surgeon: Irine Seal, MD;  Location: [facility];  Service: Urology;  Laterality: Bilateral;   EPIGASTRIC HERNIA REPAIR     HERNIA REPAIR     LUMBAR  LAMINECTOMY/DECOMPRESSION MICRODISCECTOMY Right 06/01/2017   Procedure: EXTRAFORAMINAL MICRODISCECTOMY LUMBAR FIVE- SACRAL ONE, RIGHT;  Surgeon: Eustace Moore, MD;  Location: [place];  Service: Neurosurgery;  Laterality: Right;   LUMBAR LAMINECTOMY/DECOMPRESSION MICRODISCECTOMY Bilateral 09/04/2018   Procedure: Laminectomy and Foraminotomy - Lumbar two-three bilateral;  Surgeon: Eustace Moore, MD;  Location: [place];  Service: Neurosurgery;  Laterality: Bilateral;   REVERSE SHOULDER ARTHROPLASTY Left 09/01/2021   Procedure: LEFT REVERSE SHOULDER ARTHROPLASTY;  Surgeon: Meredith Pel, MD;  Location: [place];  Service: Orthopedics;  Laterality: Left;   TOTAL HIP ARTHROPLASTY Left 12/19/2020   Procedure: LEFT TOTAL HIP ARTHROPLASTY ANTERIOR APPROACH;  Surgeon: Mcarthur Rossetti, MD;  Location: [facility];  Service: Orthopedics;  Laterality: Left;   TOTAL KNEE ARTHROPLASTY Left 12/31/2021   Procedure: TOTAL KNEE ARTHROPLASTY;  Surgeon: Meredith Pel, MD;  Location: [place];  Service: Orthopedics;  Laterality: Left;   TRANSFORAMINAL LUMBAR INTERBODY FUSION (TLIF) WITH PEDICLE SCREW FIXATION 1 LEVEL Left 06/15/2019   Procedure: Decompression and Instrumention Fusion Lumbar Four- Five;  Surgeon: Eustace Moore, MD;  Location: [place];  Service: Neurosurgery;  Laterality: Left;  Decompression and Instrumention Fusion Lumbar Four- Five   Patient Active Problem List   Diagnosis Date  Noted   Arthritis of left knee    S/P TKR (total knee replacement), left 12/31/2021   Arthritis of left shoulder region    S/P reverse total shoulder arthroplasty, left 09/01/2021   Status post left hip replacement 12/19/2020   Primary osteoarthritis of left hip 12/04/2020   Hyperlipidemia 04/30/2020   Cataract 10/10/2019   Right ureteral stone 10/01/2019   Left ureteral stone 10/01/2019   S/P lumbar fusion 06/15/2019   Acquired hypothyroidism 08/10/2017   Benign prostatic hyperplasia with nocturia 08/10/2017   S/P  lumbar laminectomy 06/01/2017   PAF (paroxysmal atrial fibrillation) ([place]) 09/07/2016   S/P AVR 01/18/2014   Hypertension 02/22/2013    PCP: Celene Squibb MD  REFERRING PROVIDER: Meredith Pel, MD   REFERRING DIAG: [phone] (ICD-10-CM) - Status post left knee replacement   THERAPY DIAG:  Left knee pain, unspecified chronicity - Plan: PT plan of care cert/re-cert  Stiffness of left knee, not elsewhere classified - Plan: PT plan of care cert/re-cert  Muscle weakness (generalized) - Plan: PT plan of care cert/re-cert  Acute pain of left shoulder - Plan: PT plan of care cert/re-cert  Other symptoms and signs involving the musculoskeletal system - Plan: PT plan of care cert/re-cert  Other abnormalities of gait and mobility - Plan: PT plan of care cert/re-cert  Stiffness of left shoulder, not elsewhere classified - Plan: PT plan of care cert/re-cert  Rationale for Evaluation and Treatment Rehabilitation  ONSET DATE: 12/31/21   SUBJECTIVE:   SUBJECTIVE STATEMENT: Patient returns from vacation; state he did not do any exercise while he was gone. No pain but reports stiffness. No pain this morning but had some over the past week. Feels he is "50% better".  PERTINENT HISTORY: S/P left reverse shoulder arthroplasty completed on 09/01/21 by Dr. Marlou Sa., Hx afib, hx CVA, Hx skin cancer, L THA, Hx lumbar surgery  PAIN:  Are you having pain? Yes: NPRS scale: 0/10 Pain location: L knee  Pain description: sore Aggravating factors: exercise Relieving factors: rest  PRECAUTIONS: None  WEIGHT BEARING RESTRICTIONS No  FALLS:  Has patient fallen in last 6 months? 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   OBJECTIVE:    Observation:  L knee held in extension, Relies on RLE for transfers  PATIENT SURVEYS:  FOTO 65% function  COGNITION:  Overall cognitive status: Within functional limits for tasks assessed     SENSATION: WFL   POSTURE: rounded shoulders and forward head  PALPATION: TTP L quads  LOWER EXTREMITY ROM:  Active ROM Right eval Left eval Left  01/21/22 Left 01/26/22 Left 02/02/22 Left 02/09/22 Left 03/01/22 Left 03/11/22  Hip flexion          Hip extension          Hip abduction          Hip adduction          Hip internal rotation          Hip external rotation          Knee flexion  62 83 improves to 88 with heel slides and 92 following contract relax 92 After Scar mass AROM 102 AROM 94 AAROM 96 AROM su0pine 105 AROM supine 98  Knee extension  Lacking 3 Lacking 2 -2 2  -6 -7  Ankle dorsiflexion          Ankle plantarflexion          Ankle inversion          Ankle eversion           (Blank rows = not tested)  LOWER EXTREMITY MMT:  MMT Right eval Left eval Left 03/11/22  Hip flexion 5 5   Hip extension     Hip abduction     Hip adduction     Hip internal rotation     Hip external rotation     Knee flexion 5 4+ 4  Knee extension 5 4+ 5  Ankle dorsiflexion 5 5   Ankle plantarflexion     Ankle inversion     Ankle eversion      (Blank rows = not tested)   FUNCTIONAL TESTS:  2 minute walk test: 275 feet  GAIT: Distance walked: 275 feet Assistive device utilized: Single point cane Level of assistance: Modified independence Comments: 2MWT, antalgic, limited knee flexion/extension ROM    TODAY'S TREATMENT: 03/11/22  Progress note 2 MWT 370 ft 5 times sit to stand 41 sec using bilateral upper extremities  Bike seat 16 x 5' 1/2 revolutions  Standing: // bars Heel/ toe raises 2 x 10 Knee drives 8" box x 20 Slant board 5 x 20" Steps over and back x 1 reciprocal pattern using bilateral handrails  Left knee extension AROM -7; knee flexion 98        03/01/20 Bike seat 16 x 5' 1/2 revolutions  Manual STM to decrease pain and swelling; patella mobs and manuel knee flexion extension in supine x 10' no other treatment performed during manual treatment  Supine:  Quad sets 5" x 10  Left knee ext -6, knee flexion 105 AROM supine today  Bike again seat 17 x 5' full revolutions x 5  Standing: Heel/ toe raises x 20 Slant board 3 x 20" Knee drives on 8" box x <[id number]>_5<\/[id number]>_4  lateral step ups x 10 TKE's GTB x 20          02/24/22  Bike seat 16 rocking with hold at end range x 5'   Manual STM to decrease pain and swelling; patella mobs and manuel knee flexion sitting and extension in supine x 12' no other treatment performed during manual treatment  Bike again seat 16 2 more minutes able to make full backward revolutions  Standing:   heel raises x 20 Slant board 3 x 30" Knee drives on 8" box x 10 8" step ups x 10          02/09/22:   Bike seat 15 rocking with hold at end range Standing: 12in step height knee drive 01U 10" for flexion Hamstring stretch 3x 30" on 12in step Heel/toe raises on slope 15x    Prone: contract/relax 10" holds for flexoin 5x    Quad stretch 3x 30"   Manual scar tissue massage and patella mobs all direction   AROM 2-94 degrees, AAROM to 96 degrees  02/02/22:   Bike seat 15 rocking with hold at end range Standing: 12in step height knee drive 27O 10" for flexion Hamstring stretch 3x 30" on 12in step Heel/toe raises on slope 15x       Prone: contract/relax 10" holds for flexoin 5x    Quad stretch 3x 30"   Manual scar tissue massage and patella mobs all direction   Supine:  TKE on 1/2 bolster   AROM 2-103 degrees  01/28/22: Bike seat 15 rocking with hold at end range Standing: 12in step height knee drive 09Q 10" for flexion Lt knee hamstring curl 10x 3" Supine Heel slide 10x  PROM 3x 10" for flexion  SKTC 3x 30" Manual scar tissue massage, proximal scat tightness, no scar tissue on distal  due to steri strips Prone: contract/relax 10" holds for flexion 3x  Quad stretch with rope 3x 30" AROM initiall at 95 degrees, PROM 97, following SKTC 103 degrees flexion  01/26/22 Nustep 4 minutes LE only seat Standing:  heelraises 15X  Lt knee hamstring curl 10X  Lt knee flexion stretch onto 12" step 10X10" Supine:  heelslides 10X  Manual scar tissue, proximal scar tightest AROM/AAROM for knee flexion following scar massage 92/95 degrees  01/21/22 Bike seat 16 rocking dynamic warm up and ROM Supine heel slides 10x 10 second holds with strap Supine hamstring sets into green ball 10 x 10 second holds Contract relax for flexion 2x 10 5- 10 second holds in seated HR 1x 15  Squat at counter 1x 10 STS from chair with black foam and UE support 1x 10   01/19/22 Quad set 5x 10 second holds Heel slides 10 x 10 second holds with strap SLR 2x 10 with quad set Seated heel slides 5x 10 second holds   PATIENT EDUCATION:  Education details:01/19/22 Patient educated on exam findings, POC, scope of PT, HEP, and LE positioning. 01/21/22 HEP Person educated: Patient Education method: Consulting civil engineer, Demonstration, and Handouts Education comprehension: verbalized understanding, returned demonstration, verbal cues required, and tactile cues required    HOME EXERCISE PROGRAM: 7/11/23Access Code: Burden Sitting Quad Set with Towel Roll Under Heel  - 3 x daily - 7 x weekly - 10 reps - 10 second hold - Supine Heel Slide with Strap  - 3-5 x daily - 7 x weekly - 10 reps - 10 second hold - Seated Heel Slide (Mirrored)  - 3-5 x daily - 7 x weekly - 10 reps - 10 second hold - Active Straight Leg Raise with Quad Set (Mirrored)  - 3 x daily - 7 x weekly - 3 sets - 10 reps  ASSESSMENT:  CLINICAL IMPRESSION: Progress note today.Today's session continue to work on lower extremity mobility and strength.  He appears and states he is not doing much at home. Unable to make full revolution on bike at the  beginning of treatment today. Does show good improvement with 2 MWT. Patient met 1 STG and 1 LTG. Needs continued work on mobility and hamstring strength.  Patient will benefit from continued skilled therapy services to address deficits and promote optimal function.   OBJECTIVE IMPAIRMENTS Abnormal gait, decreased balance, decreased mobility, difficulty walking, decreased ROM, decreased strength, increased edema, impaired flexibility, improper body mechanics, and pain.   ACTIVITY LIMITATIONS carrying, lifting, bending, standing, squatting, stairs, transfers, locomotion level, and caring for others  PARTICIPATION LIMITATIONS: meal prep, cleaning, laundry, driving, shopping, community activity, and yard work  PERSONAL FACTORS Age, Time since onset of injury/illness/exacerbation, and 3+ comorbidities: Hx afib, hx CVA, Hx skin cancer, L THA, Hx lumbar surgery, hx reverse shoulder replacement  are also affecting patient's functional outcome.   REHAB POTENTIAL: Good  CLINICAL DECISION MAKING: Stable/uncomplicated  EVALUATION COMPLEXITY: Low   GOALS: Goals reviewed with patient? Yes  SHORT TERM GOALS: Target date: 02/09/2022  Patient will be independent with HEP in order to improve functional outcomes. Baseline:  Goal status: IN  PROGRESS  2.  Patient will report at least 25% improvement in symptoms for improved quality of life. Baseline: "50%" improvement Goal status: met    LONG TERM GOALS: Target date: 03/02/2022  Patient will report at least 75% improvement in symptoms for improved quality of life. Baseline:  Goal status: IN PROGRESS  2.  Patient will improve FOTO score by at least 13 points in order to indicate improved tolerance to activity. Baseline: 7/11: 65% function Goal status: IN PROGRESS  3.  Patient will be able to navigate stairs with reciprocal pattern without compensation in order to demonstrate improved LE strength. Baseline:  Goal status: met  4.  Patient will be  able to ambulate at least 400 feet in 2MWT in order to demonstrate improved tolerance to activity. Baseline: 275 feet; 03/11/22 370 ft Goal status: IN PROGRESS  5.  Patient will improve ROM for left knee extension/flexion to 0-115 degrees to improve squatting, and other functional mobility. Baseline: lacking 3 to 62 Goal status: IN PROGRESS  6.  Patient will be able to complete 5x STS in under 11.4 seconds without compensation in order to demonstrate improved functional strength. Baseline: transfers: relies RLE, left knee held in extension, UE use; 03/11/22 21 sec using upper extremities Goal status: IN PROGRESS    PLAN: PT FREQUENCY: 2x/week  PT DURATION: 6 weeks  PLANNED INTERVENTIONS: Therapeutic exercises, Therapeutic activity, Neuromuscular re-education, Balance training, Gait training, Patient/Family education, Joint manipulation, Joint mobilization, Stair training, Orthotic/Fit training, DME instructions, Aquatic Therapy, Dry Needling, Electrical stimulation, Spinal manipulation, Spinal mobilization, Cryotherapy, Moist heat, Compression bandaging, scar mobilization, Splintting, Taping, Traction, Ultrasound, Ionotophoresis 62m/ml Dexamethasone, and Manual therapy  PLAN FOR NEXT SESSION: recommend continued PT to address remaining unmet and partially met goals.   8:54 AM, 03/11/22 Leyla Soliz Small Chaia Ikard MPT  physical therapy [place] #[phone]

## 2022-03-16 ENCOUNTER — Ambulatory Visit (HOSPITAL_COMMUNITY): Payer: 59 | Attending: Orthopedic Surgery

## 2022-03-16 DIAGNOSIS — M25662 Stiffness of left knee, not elsewhere classified: Secondary | ICD-10-CM | POA: Diagnosis not present

## 2022-03-16 DIAGNOSIS — M25562 Pain in left knee: Secondary | ICD-10-CM | POA: Insufficient documentation

## 2022-03-16 DIAGNOSIS — M6281 Muscle weakness (generalized): Secondary | ICD-10-CM | POA: Diagnosis not present

## 2022-03-16 NOTE — Therapy (Signed)
OUTPATIENT PHYSICAL THERAPY TREATMENT NOTE    Patient Name: Keith [name]. MRN: [id number] DOB:May 01, 1941, 81 y.o., male Today's Date: 03/16/2022   PT End of Session - 03/16/22 0842     Visit Number 10    Number of Visits 12    Date for PT Re-Evaluation 05/08/22    Authorization Type Primary [employer](auth after 25th visit) Secondary: Medicare    Progress Note Due on Visit 20    PT Start Time 0810    PT Stop Time 0851    PT Time Calculation (min) 41 min    Activity Tolerance Patient tolerated treatment well    Behavior During Therapy [facility] for tasks assessed/performed                    Past Medical History:  Diagnosis Date   Arthritis    Atrial fibrillation ([place])    Cardioversion 2016   Atrial flutter ([place])    Cardioversion 2013   Blood transfusion without reported diagnosis    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   Dysrhythmia    GERD (gastroesophageal reflux disease)    History of aortic valve replacement with bioprosthetic valve    Aortic regurgitation   History of chicken pox    History of kidney stones    History of skin cancer    History of stroke 2011   Hypothyroidism    Kidney stone    Seasonal allergies    Stroke [facility])    after heart surgery 2010   Past Surgical History:  Procedure Laterality Date   AORTIC VALVE REPLACEMENT  2009   Bioprosthetic AVR and root replacement   BACK SURGERY     total of 4 back surgeries   CATARACT EXTRACTION, BILATERAL     COLONOSCOPY     CYSTOSCOPY WITH RETROGRADE PYELOGRAM, URETEROSCOPY AND STENT PLACEMENT Bilateral 10/01/2019   Procedure: CYSTOSCOPY WITH RETROGRADE PYELOGRAM, URETEROSCOPY AND STENT PLACEMENT;  Surgeon: Irine Seal, MD;  Location: [facility];  Service: Urology;  Laterality: Bilateral;   EPIGASTRIC HERNIA REPAIR     HERNIA REPAIR     LUMBAR LAMINECTOMY/DECOMPRESSION MICRODISCECTOMY Right 06/01/2017   Procedure: EXTRAFORAMINAL MICRODISCECTOMY LUMBAR FIVE- SACRAL ONE, RIGHT;  Surgeon:  Eustace Moore, MD;  Location: [place];  Service: Neurosurgery;  Laterality: Right;   LUMBAR LAMINECTOMY/DECOMPRESSION MICRODISCECTOMY Bilateral 09/04/2018   Procedure: Laminectomy and Foraminotomy - Lumbar two-three bilateral;  Surgeon: Eustace Moore, MD;  Location: [place];  Service: Neurosurgery;  Laterality: Bilateral;   REVERSE SHOULDER ARTHROPLASTY Left 09/01/2021   Procedure: LEFT REVERSE SHOULDER ARTHROPLASTY;  Surgeon: Meredith Pel, MD;  Location: [place];  Service: Orthopedics;  Laterality: Left;   TOTAL HIP ARTHROPLASTY Left 12/19/2020   Procedure: LEFT TOTAL HIP ARTHROPLASTY ANTERIOR APPROACH;  Surgeon: Mcarthur Rossetti, MD;  Location: [facility];  Service: Orthopedics;  Laterality: Left;   TOTAL KNEE ARTHROPLASTY Left 12/31/2021   Procedure: TOTAL KNEE ARTHROPLASTY;  Surgeon: Meredith Pel, MD;  Location: [place];  Service: Orthopedics;  Laterality: Left;   TRANSFORAMINAL LUMBAR INTERBODY FUSION (TLIF) WITH PEDICLE SCREW FIXATION 1 LEVEL Left 06/15/2019   Procedure: Decompression and Instrumention Fusion Lumbar Four- Five;  Surgeon: Eustace Moore, MD;  Location: [place];  Service: Neurosurgery;  Laterality: Left;  Decompression and Instrumention Fusion Lumbar Four- Five   Patient Active Problem List   Diagnosis Date Noted   Arthritis of left knee    S/P TKR (total knee replacement), left 12/31/2021   Arthritis  of left shoulder region    S/P reverse total shoulder arthroplasty, left 09/01/2021   Status post left hip replacement 12/19/2020   Primary osteoarthritis of left hip 12/04/2020   Hyperlipidemia 04/30/2020   Cataract 10/10/2019   Right ureteral stone 10/01/2019   Left ureteral stone 10/01/2019   S/P lumbar fusion 06/15/2019   Acquired hypothyroidism 08/10/2017   Benign prostatic hyperplasia with nocturia 08/10/2017   S/P lumbar laminectomy 06/01/2017   PAF (paroxysmal atrial fibrillation) ([place]) 09/07/2016   S/P AVR 01/18/2014   Hypertension 02/22/2013    PCP:  Celene Squibb MD  REFERRING PROVIDER: Meredith Pel, MD   REFERRING DIAG: [phone] (ICD-10-CM) - Status post left knee replacement   THERAPY DIAG:  Left knee pain, unspecified chronicity  Stiffness of left knee, not elsewhere classified  Muscle weakness (generalized)  Rationale for Evaluation and Treatment Rehabilitation  ONSET DATE: 12/31/21   SUBJECTIVE:   SUBJECTIVE STATEMENT: Hard to get comfortable at night; no pain with walking but has some pain with trying to do the bike at home. "It just hurts to go around on the bike"  PERTINENT HISTORY: S/P left reverse shoulder arthroplasty completed on 09/01/21 by Dr. Marlou Sa., Hx afib, hx CVA, Hx skin cancer, L THA, Hx lumbar surgery  PAIN:  Are you having pain? Yes: NPRS scale: 0/10 Pain location: L knee  Pain description: sore Aggravating factors: exercise Relieving factors: rest  PRECAUTIONS: None  WEIGHT BEARING RESTRICTIONS No  FALLS:  Has patient fallen in last 6 months? 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   OBJECTIVE:   Observation:  L knee held in extension, Relies on RLE for transfers  PATIENT SURVEYS:  FOTO 65% function  COGNITION:  Overall cognitive status: Within functional limits for tasks assessed     SENSATION: WFL   POSTURE: rounded shoulders and forward head  PALPATION: TTP L quads  LOWER EXTREMITY ROM:  Active ROM Right eval Left eval Left  01/21/22 Left 01/26/22 Left 02/02/22 Left 02/09/22 Left 03/01/22 Left 03/11/22  Hip flexion          Hip extension          Hip abduction          Hip adduction          Hip internal rotation          Hip external rotation          Knee flexion  62 83 improves to 88 with heel slides  and 92 following contract relax 92 After Scar mass AROM 102 AROM 94 AAROM 96 AROM su0pine 105 AROM supine 98  Knee extension  Lacking 3 Lacking 2 -2 2  -6 -7  Ankle dorsiflexion          Ankle plantarflexion          Ankle inversion          Ankle eversion           (Blank rows = not tested)  LOWER EXTREMITY MMT:  MMT Right eval Left eval Left 03/11/22  Hip flexion 5 5   Hip extension     Hip abduction     Hip adduction     Hip internal rotation  Hip external rotation     Knee flexion 5 4+ 4  Knee extension 5 4+ 5  Ankle dorsiflexion 5 5   Ankle plantarflexion     Ankle inversion     Ankle eversion      (Blank rows = not tested)   FUNCTIONAL TESTS:  2 minute walk test: 275 feet  GAIT: Distance walked: 275 feet Assistive device utilized: Single point cane Level of assistance: Modified independence Comments: 2MWT, antalgic, limited knee flexion/extension ROM    TODAY'S TREATMENT: 03/16/22 STM  and manual knee extension to left knee in supine x 10' to improve tissue mobility Prone hang 1 lb x 1' Sitting manual knee flexion x 10 reps Bike seat 17 x 5'  Standing: Hamstring curls 3# 2 x 10 12" box knee flexion drives x 2' Heel/toe raises 2 x 10 Slant board 5 x 20" Squats to chair holding to // bars 2 x 10  Left knee AROM in supine: -7 to 102       03/11/22  Progress note 2 MWT 370 ft 5 times sit to stand 41 sec using bilateral upper extremities  Bike seat 16 x 5' 1/2 revolutions  Standing: // bars Heel/ toe raises 2 x 10 Knee drives 8" box x 20 Slant board 5 x 20" Steps over and back x 1 reciprocal pattern using bilateral handrails  Left knee extension AROM -7; knee flexion 98       03/01/20 Bike seat 16 x 5' 1/2 revolutions  Manual STM to decrease pain and swelling; patella mobs and manuel knee flexion extension in supine x 10' no other treatment performed during manual treatment  Supine:  Quad sets 5" x 10  Left knee ext -6,  knee flexion 105 AROM supine today  Bike again seat 17 x 5' full revolutions x 5  Standing: Heel/ toe raises x 20 Slant board 3 x 20" Knee drives on 8" box x <[id number]>_7<\/[id number]>_6  lateral step ups x 10 TKE's GTB x 20          02/24/22  Bike seat 16 rocking with hold at end range x 5'   Manual STM to decrease pain and swelling; patella mobs and manuel knee flexion sitting and extension in supine x 12' no other treatment performed during manual treatment  Bike again seat 16 2 more minutes able to make full backward revolutions  Standing:   heel raises x 20 Slant board 3 x 30" Knee drives on 8" box x 10 8" step ups x 10          02/09/22:   Bike seat 15 rocking with hold at end range Standing: 12in step height knee drive 38V 10" for flexion Hamstring stretch 3x 30" on 12in step Heel/toe raises on slope 15x    Prone: contract/relax 10" holds for flexoin 5x    Quad stretch 3x 30"   Manual scar tissue massage and patella mobs all direction   AROM 2-94 degrees, AAROM to 96 degrees  02/02/22:   Bike seat 15 rocking with hold at end range Standing: 12in step height knee drive 56E 10" for flexion Hamstring stretch 3x 30" on 12in step Heel/toe raises on slope 15x       Prone: contract/relax 10" holds for flexoin 5x    Quad stretch 3x 30"   Manual scar tissue massage and patella mobs all direction   Supine: TKE on 1/2 bolster   AROM 2-103 degrees  01/28/22: Bike seat 15 rocking with hold at  end range Standing: 12in step height knee drive 92E 10" for flexion Lt knee hamstring curl 10x 3" Supine Heel slide 10x  PROM 3x 10" for flexion  SKTC 3x 30" Manual scar tissue massage, proximal scat tightness, no scar tissue on distal due to steri strips Prone: contract/relax 10" holds for flexion 3x  Quad stretch with rope 3x 30" AROM initiall at 95 degrees, PROM 97, following SKTC 103 degrees flexion  01/26/22 Nustep 4 minutes LE only seat Standing:  heelraises 15X  Lt knee hamstring curl  10X  Lt knee flexion stretch onto 12" step 10X10" Supine:  heelslides 10X  Manual scar tissue, proximal scar tightest AROM/AAROM for knee flexion following scar massage 92/95 degrees  01/21/22 Bike seat 16 rocking dynamic warm up and ROM Supine heel slides 10x 10 second holds with strap Supine hamstring sets into green ball 10 x 10 second holds Contract relax for flexion 2x 10 5- 10 second holds in seated HR 1x 15  Squat at counter 1x 10 STS from chair with black foam and UE support 1x 10   01/19/22 Quad set 5x 10 second holds Heel slides 10 x 10 second holds with strap SLR 2x 10 with quad set Seated heel slides 5x 10 second holds   PATIENT EDUCATION:  Education details:01/19/22 Patient educated on exam findings, POC, scope of PT, HEP, and LE positioning. 01/21/22 HEP Person educated: Patient Education method: Consulting civil engineer, Demonstration, and Handouts Education comprehension: verbalized understanding, returned demonstration, verbal cues required, and tactile cues required    HOME EXERCISE PROGRAM: 7/11/23Access Code: Evan Sitting Quad Set with Towel Roll Under Heel  - 3 x daily - 7 x weekly - 10 reps - 10 second hold - Supine Heel Slide with Strap  - 3-5 x daily - 7 x weekly - 10 reps - 10 second hold - Seated Heel Slide (Mirrored)  - 3-5 x daily - 7 x weekly - 10 reps - 10 second hold - Active Straight Leg Raise with Quad Set (Mirrored)  - 3 x daily - 7 x weekly - 3 sets - 10 reps  ASSESSMENT:  CLINICAL IMPRESSION: Today's session continued to focus on left knee mobility and strengthening.  Patient continued to have pain and anxiety with trying to make full revolution on bike.  Added hamstring curls to address hamstring weakness without issue; will increase weight next visit. Continues with extension lag; noted swelling at the knee joint. Palpable tightness IT band noted.  Patient will benefit from continued skilled therapy services to address deficits and promote  optimal function.   OBJECTIVE IMPAIRMENTS Abnormal gait, decreased balance, decreased mobility, difficulty walking, decreased ROM, decreased strength, increased edema, impaired flexibility, improper body mechanics, and pain.   ACTIVITY LIMITATIONS carrying, lifting, bending, standing, squatting, stairs, transfers, locomotion level, and caring for others  PARTICIPATION LIMITATIONS: meal prep, cleaning, laundry, driving, shopping, community activity, and yard work  PERSONAL FACTORS Age, Time since onset of injury/illness/exacerbation, and 3+ comorbidities: Hx afib, hx CVA, Hx skin cancer, L THA, Hx lumbar surgery, hx reverse shoulder replacement  are also affecting patient's functional outcome.   REHAB POTENTIAL: Good  CLINICAL DECISION MAKING: Stable/uncomplicated  EVALUATION COMPLEXITY: Low   GOALS: Goals reviewed with patient? Yes  SHORT TERM GOALS: Target date: 02/09/2022  Patient will be independent with HEP in order to improve functional outcomes. Baseline:  Goal status: IN PROGRESS  2.  Patient will report at least 25% improvement in symptoms for improved quality of life. Baseline: "50%" improvement  Goal status: met    LONG TERM GOALS: Target date: 03/02/2022  Patient will report at least 75% improvement in symptoms for improved quality of life. Baseline:  Goal status: IN PROGRESS  2.  Patient will improve FOTO score by at least 13 points in order to indicate improved tolerance to activity. Baseline: 7/11: 65% function Goal status: IN PROGRESS  3.  Patient will be able to navigate stairs with reciprocal pattern without compensation in order to demonstrate improved LE strength. Baseline:  Goal status: met  4.  Patient will be able to ambulate at least 400 feet in 2MWT in order to demonstrate improved tolerance to activity. Baseline: 275 feet; 03/11/22 370 ft Goal status: IN PROGRESS  5.  Patient will improve ROM for left knee extension/flexion to 0-115 degrees to  improve squatting, and other functional mobility. Baseline: lacking 3 to 62 Goal status: IN PROGRESS  6.  Patient will be able to complete 5x STS in under 11.4 seconds without compensation in order to demonstrate improved functional strength. Baseline: transfers: relies RLE, left knee held in extension, UE use; 03/11/22 21 sec using upper extremities Goal status: IN PROGRESS    PLAN: PT FREQUENCY: 2x/week  PT DURATION: 6 weeks  PLANNED INTERVENTIONS: Therapeutic exercises, Therapeutic activity, Neuromuscular re-education, Balance training, Gait training, Patient/Family education, Joint manipulation, Joint mobilization, Stair training, Orthotic/Fit training, DME instructions, Aquatic Therapy, Dry Needling, Electrical stimulation, Spinal manipulation, Spinal mobilization, Cryotherapy, Moist heat, Compression bandaging, scar mobilization, Splintting, Taping, Traction, Ultrasound, Ionotophoresis 17m/ml Dexamethasone, and Manual therapy  PLAN FOR NEXT SESSION: continue with knee mobility and strengthening; increase weight hamstring curls. Trial of Nustep next time versus bike for mobility   8:53 AM, 03/16/22 Borghild Thaker Small Jazzmin Newbold MPT [facility] [place] #[phone]

## 2022-03-18 ENCOUNTER — Ambulatory Visit (HOSPITAL_COMMUNITY): Payer: 59

## 2022-03-18 DIAGNOSIS — M25562 Pain in left knee: Secondary | ICD-10-CM

## 2022-03-18 DIAGNOSIS — M25662 Stiffness of left knee, not elsewhere classified: Secondary | ICD-10-CM | POA: Diagnosis not present

## 2022-03-18 DIAGNOSIS — M6281 Muscle weakness (generalized): Secondary | ICD-10-CM | POA: Diagnosis not present

## 2022-03-18 NOTE — Therapy (Signed)
OUTPATIENT PHYSICAL THERAPY TREATMENT NOTE    Patient Name: Keith [name]. MRN: [id number] DOB:1940-12-12, 81 y.o., male Today's Date: 03/18/2022   PT End of Session - 03/18/22 0818     Visit Number 11    Number of Visits 12    Date for PT Re-Evaluation 05/08/22    Authorization Type Primary [employer](auth after 25th visit) Secondary: Medicare    Progress Note Due on Visit 20    PT Start Time 0815    PT Stop Time 0900    PT Time Calculation (min) 45 min    Activity Tolerance Patient tolerated treatment well    Behavior During Therapy [facility] for tasks assessed/performed                     Past Medical History:  Diagnosis Date   Arthritis    Atrial fibrillation ([place])    Cardioversion 2016   Atrial flutter ([place])    Cardioversion 2013   Blood transfusion without reported diagnosis    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   Dysrhythmia    GERD (gastroesophageal reflux disease)    History of aortic valve replacement with bioprosthetic valve    Aortic regurgitation   History of chicken pox    History of kidney stones    History of skin cancer    History of stroke 2011   Hypothyroidism    Kidney stone    Seasonal allergies    Stroke [facility])    after heart surgery 2010   Past Surgical History:  Procedure Laterality Date   AORTIC VALVE REPLACEMENT  2009   Bioprosthetic AVR and root replacement   BACK SURGERY     total of 4 back surgeries   CATARACT EXTRACTION, BILATERAL     COLONOSCOPY     CYSTOSCOPY WITH RETROGRADE PYELOGRAM, URETEROSCOPY AND STENT PLACEMENT Bilateral 10/01/2019   Procedure: CYSTOSCOPY WITH RETROGRADE PYELOGRAM, URETEROSCOPY AND STENT PLACEMENT;  Surgeon: Irine Seal, MD;  Location: [facility];  Service: Urology;  Laterality: Bilateral;   EPIGASTRIC HERNIA REPAIR     HERNIA REPAIR     LUMBAR LAMINECTOMY/DECOMPRESSION MICRODISCECTOMY Right 06/01/2017   Procedure: EXTRAFORAMINAL MICRODISCECTOMY LUMBAR FIVE- SACRAL ONE, RIGHT;  Surgeon:  Eustace Moore, MD;  Location: [place];  Service: Neurosurgery;  Laterality: Right;   LUMBAR LAMINECTOMY/DECOMPRESSION MICRODISCECTOMY Bilateral 09/04/2018   Procedure: Laminectomy and Foraminotomy - Lumbar two-three bilateral;  Surgeon: Eustace Moore, MD;  Location: [place];  Service: Neurosurgery;  Laterality: Bilateral;   REVERSE SHOULDER ARTHROPLASTY Left 09/01/2021   Procedure: LEFT REVERSE SHOULDER ARTHROPLASTY;  Surgeon: Meredith Pel, MD;  Location: [place];  Service: Orthopedics;  Laterality: Left;   TOTAL HIP ARTHROPLASTY Left 12/19/2020   Procedure: LEFT TOTAL HIP ARTHROPLASTY ANTERIOR APPROACH;  Surgeon: Mcarthur Rossetti, MD;  Location: [facility];  Service: Orthopedics;  Laterality: Left;   TOTAL KNEE ARTHROPLASTY Left 12/31/2021   Procedure: TOTAL KNEE ARTHROPLASTY;  Surgeon: Meredith Pel, MD;  Location: [place];  Service: Orthopedics;  Laterality: Left;   TRANSFORAMINAL LUMBAR INTERBODY FUSION (TLIF) WITH PEDICLE SCREW FIXATION 1 LEVEL Left 06/15/2019   Procedure: Decompression and Instrumention Fusion Lumbar Four- Five;  Surgeon: Eustace Moore, MD;  Location: [place];  Service: Neurosurgery;  Laterality: Left;  Decompression and Instrumention Fusion Lumbar Four- Five   Patient Active Problem List   Diagnosis Date Noted   Arthritis of left knee    S/P TKR (total knee replacement), left 12/31/2021  Arthritis of left shoulder region    S/P reverse total shoulder arthroplasty, left 09/01/2021   Status post left hip replacement 12/19/2020   Primary osteoarthritis of left hip 12/04/2020   Hyperlipidemia 04/30/2020   Cataract 10/10/2019   Right ureteral stone 10/01/2019   Left ureteral stone 10/01/2019   S/P lumbar fusion 06/15/2019   Acquired hypothyroidism 08/10/2017   Benign prostatic hyperplasia with nocturia 08/10/2017   S/P lumbar laminectomy 06/01/2017   PAF (paroxysmal atrial fibrillation) ([place]) 09/07/2016   S/P AVR 01/18/2014   Hypertension 02/22/2013    PCP:  Celene Squibb MD  REFERRING PROVIDER: Meredith Pel, MD   REFERRING DIAG: [phone] (ICD-10-CM) - Status post left knee replacement   THERAPY DIAG:  Left knee pain, unspecified chronicity  Stiffness of left knee, not elsewhere classified  Muscle weakness (generalized)  Rationale for Evaluation and Treatment Rehabilitation  ONSET DATE: 12/31/21   SUBJECTIVE:   SUBJECTIVE STATEMENT: Patient reports compliance with knee drives for flexion at home  PERTINENT HISTORY: S/P left reverse shoulder arthroplasty completed on 09/01/21 by Dr. Marlou Sa., Hx afib, hx CVA, Hx skin cancer, L THA, Hx lumbar surgery  PAIN:  Are you having pain? Yes: NPRS scale: 0/10 Pain location: L knee  Pain description: sore Aggravating factors: exercise Relieving factors: rest  PRECAUTIONS: None  WEIGHT BEARING RESTRICTIONS No  FALLS:  Has patient fallen in last 6 months? 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   OBJECTIVE:   Observation:  L knee held in extension, Relies on RLE for transfers  PATIENT SURVEYS:  FOTO 65% function  COGNITION:  Overall cognitive status: Within functional limits for tasks assessed     SENSATION: WFL   POSTURE: rounded shoulders and forward head  PALPATION: TTP L quads  LOWER EXTREMITY ROM:  Active ROM Right eval Left eval Left  01/21/22 Left 01/26/22 Left 02/02/22 Left 02/09/22 Left 03/01/22 Left 03/11/22  Hip flexion          Hip extension          Hip abduction          Hip adduction          Hip internal rotation          Hip external rotation          Knee flexion  62 83 improves to 88 with heel slides and 92 following contract relax 92 After Scar mass AROM 102 AROM 94 AAROM 96 AROM  su0pine 105 AROM supine 98  Knee extension  Lacking 3 Lacking 2 -2 2  -6 -7  Ankle dorsiflexion          Ankle plantarflexion          Ankle inversion          Ankle eversion           (Blank rows = not tested)  LOWER EXTREMITY MMT:  MMT Right eval Left eval Left 03/11/22  Hip flexion 5 5   Hip extension     Hip abduction     Hip adduction     Hip internal rotation     Hip external rotation     Knee flexion 5 4+ 4  Knee extension 5 4+ 5  Ankle  dorsiflexion 5 5   Ankle plantarflexion     Ankle inversion     Ankle eversion      (Blank rows = not tested)   FUNCTIONAL TESTS:  2 minute walk test: 275 feet  GAIT: Distance walked: 275 feet Assistive device utilized: Single point cane Level of assistance: Modified independence Comments: 2MWT, antalgic, limited knee flexion/extension ROM    TODAY'S TREATMENT: 03/18/22 Nustep seat 11 x 5'  Standing: Heel/toe raises 2 x 10 Slant board 5 x 20" 12" box knee drives x 20 Squats to chair 2 x 10 Tandem stance 2 x 30" each 8" step ups 2 x 10 6" lateral step ups x 10  Prone hang 3# x 2'  Left knee AROM -6 to 105  03/16/22 STM  and manual knee extension to left knee in supine x 10' to improve tissue mobility Prone hang 1 lb x 1' Sitting manual knee flexion x 10 reps Bike seat 17 x 5'  Standing: Hamstring curls 3# 2 x 10 12" box knee flexion drives x 2' Heel/toe raises 2 x 10 Slant board 5 x 20" Squats to chair holding to // bars 2 x 10  Left knee AROM in supine: -7 to 102       03/11/22  Progress note 2 MWT 370 ft 5 times sit to stand 41 sec using bilateral upper extremities  Bike seat 16 x 5' 1/2 revolutions  Standing: // bars Heel/ toe raises 2 x 10 Knee drives 8" box x 20 Slant board 5 x 20" Steps over and back x 1 reciprocal pattern using bilateral handrails  Left knee extension AROM -7; knee flexion 98       03/01/20 Bike seat 16 x 5' 1/2 revolutions  Manual STM to decrease pain and  swelling; patella mobs and manuel knee flexion extension in supine x 10' no other treatment performed during manual treatment  Supine:  Quad sets 5" x 10  Left knee ext -6, knee flexion 105 AROM supine today  Bike again seat 17 x 5' full revolutions x 5  Standing: Heel/ toe raises x 20 Slant board 3 x 20" Knee drives on 8" box x <XYBFXOVANVBTYOMA>_0<\/OKHTXHFSFSELTRVU>_0  lateral step ups x 10 TKE's GTB x 20          02/24/22  Bike seat 16 rocking with hold at end range x 5'   Manual STM to decrease pain and swelling; patella mobs and manuel knee flexion sitting and extension in supine x 12' no other treatment performed during manual treatment  Bike again seat 16 2 more minutes able to make full backward revolutions  Standing:   heel raises x 20 Slant board 3 x 30" Knee drives on 8" box x 10 8" step ups x 10          02/09/22:   Bike seat 15 rocking with hold at end range Standing: 12in step height knee drive 23X 10" for flexion Hamstring stretch 3x 30" on 12in step Heel/toe raises on slope 15x    Prone: contract/relax 10" holds for flexoin 5x    Quad stretch 3x 30"   Manual scar tissue massage and patella mobs all direction   AROM 2-94 degrees, AAROM to 96 degrees  02/02/22:   Bike seat 15 rocking with hold at end range Standing: 12in step height knee drive 43H 10" for flexion Hamstring stretch 3x 30" on 12in step Heel/toe raises on slope 15x       Prone: contract/relax 10" holds  for flexoin 5x    Quad stretch 3x 30"   Manual scar tissue massage and patella mobs all direction   Supine: TKE on 1/2 bolster   AROM 2-103 degrees  01/28/22: Bike seat 15 rocking with hold at end range Standing: 12in step height knee drive 03T 10" for flexion Lt knee hamstring curl 10x 3" Supine Heel slide 10x  PROM 3x 10" for flexion  SKTC 3x 30" Manual scar tissue massage, proximal scat tightness, no scar tissue on distal due to steri strips Prone: contract/relax 10" holds for flexion 3x  Quad stretch with  rope 3x 30" AROM initiall at 95 degrees, PROM 97, following SKTC 103 degrees flexion  01/26/22 Nustep 4 minutes LE only seat Standing:  heelraises 15X  Lt knee hamstring curl 10X  Lt knee flexion stretch onto 12" step 10X10" Supine:  heelslides 10X  Manual scar tissue, proximal scar tightest AROM/AAROM for knee flexion following scar massage 92/95 degrees  01/21/22 Bike seat 16 rocking dynamic warm up and ROM Supine heel slides 10x 10 second holds with strap Supine hamstring sets into green ball 10 x 10 second holds Contract relax for flexion 2x 10 5- 10 second holds in seated HR 1x 15  Squat at counter 1x 10 STS from chair with black foam and UE support 1x 10   01/19/22 Quad set 5x 10 second holds Heel slides 10 x 10 second holds with strap SLR 2x 10 with quad set Seated heel slides 5x 10 second holds   PATIENT EDUCATION:  Education details:03/18/22 updated HEP; 01/19/22 Patient educated on exam findings, POC, scope of PT, HEP, and LE positioning. 01/21/22 HEP Person educated: Patient Education method: Consulting civil engineer, Demonstration, and Handouts Education comprehension: verbalized understanding, returned demonstration, verbal cues required, and tactile cues required    HOME EXERCISE PROGRAM: 03/18/22 Knee drives for flexion Prone hang 7/11/23Access Code: [email or web address] - Long Sitting Quad Set with Towel Roll Under Heel  - 3 x daily - 7 x weekly - 10 reps - 10 second hold - Supine Heel Slide with Strap  - 3-5 x daily - 7 x weekly - 10 reps - 10 second hold - Seated Heel Slide (Mirrored)  - 3-5 x daily - 7 x weekly - 10 reps - 10 second hold - Active Straight Leg Raise with Quad Set (Mirrored)  - 3 x daily - 7 x weekly - 3 sets - 10 reps  ASSESSMENT:  CLINICAL IMPRESSION: Today's session continued to focus on left knee mobility and strengthening. Tried Nustep versus bike to warm up; improve knee mobility without issue.  Added weight to prone hang to increase knee extension stretch;  added step ups for strengthening.  Patient with slowly improving mobility of the knee.   Patient will benefit from continued skilled therapy services to address deficits and promote optimal function.   OBJECTIVE IMPAIRMENTS Abnormal gait, decreased balance, decreased mobility, difficulty walking, decreased ROM, decreased strength, increased edema, impaired flexibility, improper body mechanics, and pain.   ACTIVITY LIMITATIONS carrying, lifting, bending, standing, squatting, stairs, transfers, locomotion level, and caring for others  PARTICIPATION LIMITATIONS: meal prep, cleaning, laundry, driving, shopping, community activity, and yard work  PERSONAL FACTORS Age, Time since onset of injury/illness/exacerbation, and 3+ comorbidities: Hx afib, hx CVA, Hx skin cancer, L THA, Hx lumbar surgery, hx reverse shoulder replacement  are also affecting patient's functional outcome.   REHAB POTENTIAL: Good  CLINICAL DECISION MAKING: Stable/uncomplicated  EVALUATION COMPLEXITY: Low   GOALS: Goals reviewed with patient? Yes  SHORT  TERM GOALS: Target date: 02/09/2022  Patient will be independent with HEP in order to improve functional outcomes. Baseline:  Goal status: IN PROGRESS  2.  Patient will report at least 25% improvement in symptoms for improved quality of life. Baseline: "50%" improvement Goal status: met    LONG TERM GOALS: Target date: 03/02/2022  Patient will report at least 75% improvement in symptoms for improved quality of life. Baseline:  Goal status: IN PROGRESS  2.  Patient will improve FOTO score by at least 13 points in order to indicate improved tolerance to activity. Baseline: 7/11: 65% function Goal status: IN PROGRESS  3.  Patient will be able to navigate stairs with reciprocal pattern without compensation in order to demonstrate improved LE strength. Baseline:  Goal status: met  4.  Patient will be able to ambulate at least 400 feet in 2MWT in order to demonstrate  improved tolerance to activity. Baseline: 275 feet; 03/11/22 370 ft Goal status: IN PROGRESS  5.  Patient will improve ROM for left knee extension/flexion to 0-115 degrees to improve squatting, and other functional mobility. Baseline: lacking 3 to 62 Goal status: IN PROGRESS  6.  Patient will be able to complete 5x STS in under 11.4 seconds without compensation in order to demonstrate improved functional strength. Baseline: transfers: relies RLE, left knee held in extension, UE use; 03/11/22 21 sec using upper extremities Goal status: IN PROGRESS    PLAN: PT FREQUENCY: 2x/week  PT DURATION: 6 weeks  PLANNED INTERVENTIONS: Therapeutic exercises, Therapeutic activity, Neuromuscular re-education, Balance training, Gait training, Patient/Family education, Joint manipulation, Joint mobilization, Stair training, Orthotic/Fit training, DME instructions, Aquatic Therapy, Dry Needling, Electrical stimulation, Spinal manipulation, Spinal mobilization, Cryotherapy, Moist heat, Compression bandaging, scar mobilization, Splintting, Taping, Traction, Ultrasound, Ionotophoresis 67m/ml Dexamethasone, and Manual therapy  PLAN FOR NEXT SESSION: continue with knee mobility and strengthening; increase weight hamstring curls. Trial of Nustep next time versus bike for mobility   9:03 AM, 03/18/22 Nikesh Teschner Small Ylianna Almanzar MPT [facility] [place] #[phone]

## 2022-03-22 ENCOUNTER — Ambulatory Visit (HOSPITAL_COMMUNITY): Payer: 59

## 2022-03-22 DIAGNOSIS — M25662 Stiffness of left knee, not elsewhere classified: Secondary | ICD-10-CM

## 2022-03-22 DIAGNOSIS — M6281 Muscle weakness (generalized): Secondary | ICD-10-CM | POA: Diagnosis not present

## 2022-03-22 DIAGNOSIS — M25562 Pain in left knee: Secondary | ICD-10-CM

## 2022-03-22 NOTE — Therapy (Addendum)
OUTPATIENT PHYSICAL THERAPY TREATMENT NOTE    Patient Name: Keith [name]. MRN: [id number] DOB:[birth year], 81 y.o., male Today's Date: 03/22/2022   PT End of Session - 03/22/22 0907     Visit Number 12    Number of Visits 24    Date for PT Re-Evaluation 05/08/22    Authorization Type Primary [employer](auth after 25th visit) Secondary: Medicare    Progress Note Due on Visit 20    PT Start Time 0857    PT Stop Time 0940    PT Time Calculation (min) 43 min    Activity Tolerance Patient tolerated treatment well    Behavior During Therapy [facility] for tasks assessed/performed                      Past Medical History:  Diagnosis Date   Arthritis    Atrial fibrillation ([place])    Cardioversion 2016   Atrial flutter ([place])    Cardioversion 2013   Blood transfusion without reported diagnosis    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   Dysrhythmia    GERD (gastroesophageal reflux disease)    History of aortic valve replacement with bioprosthetic valve    Aortic regurgitation   History of chicken pox    History of kidney stones    History of skin cancer    History of stroke 2011   Hypothyroidism    Kidney stone    Seasonal allergies    Stroke [facility])    after heart surgery 2010   Past Surgical History:  Procedure Laterality Date   AORTIC VALVE REPLACEMENT  2009   Bioprosthetic AVR and root replacement   BACK SURGERY     total of 4 back surgeries   CATARACT EXTRACTION, BILATERAL     COLONOSCOPY     CYSTOSCOPY WITH RETROGRADE PYELOGRAM, URETEROSCOPY AND STENT PLACEMENT Bilateral 10/01/2019   Procedure: CYSTOSCOPY WITH RETROGRADE PYELOGRAM, URETEROSCOPY AND STENT PLACEMENT;  Surgeon: Irine Seal, MD;  Location: [facility];  Service: Urology;  Laterality: Bilateral;   EPIGASTRIC HERNIA REPAIR     HERNIA REPAIR     LUMBAR LAMINECTOMY/DECOMPRESSION MICRODISCECTOMY Right 06/01/2017   Procedure: EXTRAFORAMINAL MICRODISCECTOMY LUMBAR FIVE- SACRAL ONE, RIGHT;   Surgeon: Eustace Moore, MD;  Location: [place];  Service: Neurosurgery;  Laterality: Right;   LUMBAR LAMINECTOMY/DECOMPRESSION MICRODISCECTOMY Bilateral 09/04/2018   Procedure: Laminectomy and Foraminotomy - Lumbar two-three bilateral;  Surgeon: Eustace Moore, MD;  Location: [place];  Service: Neurosurgery;  Laterality: Bilateral;   REVERSE SHOULDER ARTHROPLASTY Left 09/01/2021   Procedure: LEFT REVERSE SHOULDER ARTHROPLASTY;  Surgeon: Meredith Pel, MD;  Location: [place];  Service: Orthopedics;  Laterality: Left;   TOTAL HIP ARTHROPLASTY Left 12/19/2020   Procedure: LEFT TOTAL HIP ARTHROPLASTY ANTERIOR APPROACH;  Surgeon: Mcarthur Rossetti, MD;  Location: [facility];  Service: Orthopedics;  Laterality: Left;   TOTAL KNEE ARTHROPLASTY Left 12/31/2021   Procedure: TOTAL KNEE ARTHROPLASTY;  Surgeon: Meredith Pel, MD;  Location: [place];  Service: Orthopedics;  Laterality: Left;   TRANSFORAMINAL LUMBAR INTERBODY FUSION (TLIF) WITH PEDICLE SCREW FIXATION 1 LEVEL Left 06/15/2019   Procedure: Decompression and Instrumention Fusion Lumbar Four- Five;  Surgeon: Eustace Moore, MD;  Location: [place];  Service: Neurosurgery;  Laterality: Left;  Decompression and Instrumention Fusion Lumbar Four- Five   Patient Active Problem List   Diagnosis Date Noted   Arthritis of left knee    S/P TKR (total knee replacement), left 12/31/2021  Arthritis of left shoulder region    S/P reverse total shoulder arthroplasty, left 09/01/2021   Status post left hip replacement 12/19/2020   Primary osteoarthritis of left hip 12/04/2020   Hyperlipidemia 04/30/2020   Cataract 10/10/2019   Right ureteral stone 10/01/2019   Left ureteral stone 10/01/2019   S/P lumbar fusion 06/15/2019   Acquired hypothyroidism 08/10/2017   Benign prostatic hyperplasia with nocturia 08/10/2017   S/P lumbar laminectomy 06/01/2017   PAF (paroxysmal atrial fibrillation) ([place]) 09/07/2016   S/P AVR 01/18/2014   Hypertension 02/22/2013     PCP: Celene Squibb MD  REFERRING PROVIDER: Meredith Pel, MD   REFERRING DIAG: [phone] (ICD-10-CM) - Status post left knee replacement   THERAPY DIAG:  Left knee pain, unspecified chronicity  Stiffness of left knee, not elsewhere classified  Muscle weakness (generalized)  Rationale for Evaluation and Treatment Rehabilitation  ONSET DATE: 12/31/21   SUBJECTIVE:   SUBJECTIVE STATEMENT: No pain this morning but reports he is stiff; was compliant with prone hang stretch.   PERTINENT HISTORY: S/P left reverse shoulder arthroplasty completed on 09/01/21 by Dr. Marlou Sa., Hx afib, hx CVA, Hx skin cancer, L THA, Hx lumbar surgery  PAIN:  Are you having pain? Yes: NPRS scale: 0/10 Pain location: L knee  Pain description: sore Aggravating factors: exercise Relieving factors: rest  PRECAUTIONS: None  WEIGHT BEARING RESTRICTIONS No  FALLS:  Has patient fallen in last 6 months? 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   OBJECTIVE:   Observation:  L knee held in extension, Relies on RLE for transfers  PATIENT SURVEYS:  FOTO 65% function  COGNITION:  Overall cognitive status: Within functional limits for tasks assessed     SENSATION: WFL   POSTURE: rounded shoulders and forward head  PALPATION: TTP L quads  LOWER EXTREMITY ROM:  Active ROM Right eval Left eval Left  01/21/22 Left 01/26/22 Left 02/02/22 Left 02/09/22 Left 03/01/22 Left 03/11/22  Hip flexion          Hip extension          Hip abduction          Hip adduction          Hip internal rotation          Hip external rotation          Knee flexion  62 83 improves to 88 with heel slides and 92 following contract relax 92 After Scar mass  AROM 102 AROM 94 AAROM 96 AROM su0pine 105 AROM supine 98  Knee extension  Lacking 3 Lacking 2 -2 2  -6 -7  Ankle dorsiflexion          Ankle plantarflexion          Ankle inversion          Ankle eversion           (Blank rows = not tested)  LOWER EXTREMITY MMT:  MMT Right eval Left eval Left 03/11/22  Hip flexion 5 5   Hip extension     Hip abduction     Hip adduction     Hip internal rotation     Hip external rotation     Knee flexion 5 4+ 4  Knee  extension 5 4+ 5  Ankle dorsiflexion 5 5   Ankle plantarflexion     Ankle inversion     Ankle eversion      (Blank rows = not tested)   FUNCTIONAL TESTS:  2 minute walk test: 275 feet  GAIT: Distance walked: 275 feet Assistive device utilized: Single point cane Level of assistance: Modified independence Comments: 2MWT, antalgic, limited knee flexion/extension ROM    TODAY'S TREATMENT: 03/22/22 Nustep seat 11 x 5'  Seated: Heel slide x 2' Manual knee flexion x 10 reps with 5" hold Knee flexion eccentric; sliding bottom off mat 2 x 10   Standing: Knee drives for flexion 12" box x 2' Squats to chair 2 x 10 Heel/toe raises 2 x 10 Slant board 5 x 20" 6" step ups 2 x 10 Walkouts 4 plates retro and side to side x 5 reps each TKE GTB 2 x 10  Prone hang 3# x 3'        03/18/22 Nustep seat 11 x 5'  Standing: Heel/toe raises 2 x 10 Slant board 5 x 20" 12" box knee drives x 20 Squats to chair 2 x 10 Tandem stance 2 x 30" each 8" step ups 2 x 10 6" lateral step ups x 10  Prone hang 3# x 2'  Left knee AROM -6 to 105  03/16/22 STM  and manual knee extension to left knee in supine x 10' to improve tissue mobility Prone hang 1 lb x 1' Sitting manual knee flexion x 10 reps Bike seat 17 x 5'  Standing: Hamstring curls 3# 2 x 10 12" box knee flexion drives x 2' Heel/toe raises 2 x 10 Slant board 5 x 20" Squats to chair holding to // bars 2 x 10  Left knee AROM in supine: -7 to  102       03/11/22  Progress note 2 MWT 370 ft 5 times sit to stand 41 sec using bilateral upper extremities  Bike seat 16 x 5' 1/2 revolutions  Standing: // bars Heel/ toe raises 2 x 10 Knee drives 8" box x 20 Slant board 5 x 20" Steps over and back x 1 reciprocal pattern using bilateral handrails  Left knee extension AROM -7; knee flexion 98       03/01/20 Bike seat 16 x 5' 1/2 revolutions  Manual STM to decrease pain and swelling; patella mobs and manuel knee flexion extension in supine x 10' no other treatment performed during manual treatment  Supine:  Quad sets 5" x 10  Left knee ext -6, knee flexion 105 AROM supine today  Bike again seat 17 x 5' full revolutions x 5  Standing: Heel/ toe raises x 20 Slant board 3 x 20" Knee drives on 8" box x '1\' 8"'  lateral step ups x 10 TKE's GTB x 20          02/24/22  Bike seat 16 rocking with hold at end range x 5'   Manual STM to decrease pain and swelling; patella mobs and manuel knee flexion sitting and extension in supine x 12' no other treatment performed during manual treatment  Bike again seat 16 2 more minutes able to make full backward revolutions  Standing:   heel raises x 20 Slant board 3 x 30" Knee drives on 8" box x 10 8" step ups x 10        PATIENT EDUCATION:  Education details:03/18/22 updated HEP; 01/19/22 Patient educated on exam findings, POC, scope of PT, HEP,  and LE positioning. 01/21/22 HEP Person educated: Patient Education method: Consulting civil engineer, Demonstration, and Handouts Education comprehension: verbalized understanding, returned demonstration, verbal cues required, and tactile cues required    HOME EXERCISE PROGRAM: 03/18/22 Knee drives for flexion Prone hang 7/11/23Access Code: [email or web address] - Long Sitting Quad Set with Towel Roll Under Heel  - 3 x daily - 7 x weekly - 10 reps - 10 second hold - Supine Heel Slide with Strap  - 3-5 x daily - 7 x weekly - 10 reps - 10 second  hold - Seated Heel Slide (Mirrored)  - 3-5 x daily - 7 x weekly - 10 reps - 10 second hold - Active Straight Leg Raise with Quad Set (Mirrored)  - 3 x daily - 7 x weekly - 3 sets - 10 reps  ASSESSMENT:  CLINICAL IMPRESSION: Today's session continued to focus on left knee mobility and strengthening.Nustep versus bike to warm up.  Added walkouts today for strengthening.  Patient still has some anxiety with trying to make revolution on bike. Fair to Good patella mobility noted although noticeably less mobile than non operative side; still has noted swelling in knee joint that limits mobility.   Patient will benefit from continued skilled therapy services to address deficits and promote optimal function.   OBJECTIVE IMPAIRMENTS Abnormal gait, decreased balance, decreased mobility, difficulty walking, decreased ROM, decreased strength, increased edema, impaired flexibility, improper body mechanics, and pain.   ACTIVITY LIMITATIONS carrying, lifting, bending, standing, squatting, stairs, transfers, locomotion level, and caring for others  PARTICIPATION LIMITATIONS: meal prep, cleaning, laundry, driving, shopping, community activity, and yard work  PERSONAL FACTORS Age, Time since onset of injury/illness/exacerbation, and 3+ comorbidities: Hx afib, hx CVA, Hx skin cancer, L THA, Hx lumbar surgery, hx reverse shoulder replacement  are also affecting patient's functional outcome.   REHAB POTENTIAL: Good  CLINICAL DECISION MAKING: Stable/uncomplicated  EVALUATION COMPLEXITY: Low   GOALS: Goals reviewed with patient? Yes  SHORT TERM GOALS: Target date: 02/09/2022  Patient will be independent with HEP in order to improve functional outcomes. Baseline:  Goal status: IN PROGRESS  2.  Patient will report at least 25% improvement in symptoms for improved quality of life. Baseline: "50%" improvement Goal status: met    LONG TERM GOALS: Target date: 03/02/2022  Patient will report at least 75%  improvement in symptoms for improved quality of life. Baseline:  Goal status: IN PROGRESS  2.  Patient will improve FOTO score by at least 13 points in order to indicate improved tolerance to activity. Baseline: 7/11: 65% function Goal status: IN PROGRESS  3.  Patient will be able to navigate stairs with reciprocal pattern without compensation in order to demonstrate improved LE strength. Baseline:  Goal status: met  4.  Patient will be able to ambulate at least 400 feet in 2MWT in order to demonstrate improved tolerance to activity. Baseline: 275 feet; 03/11/22 370 ft Goal status: IN PROGRESS  5.  Patient will improve ROM for left knee extension/flexion to 0-115 degrees to improve squatting, and other functional mobility. Baseline: lacking 3 to 62 Goal status: IN PROGRESS  6.  Patient will be able to complete 5x STS in under 11.4 seconds without compensation in order to demonstrate improved functional strength. Baseline: transfers: relies RLE, left knee held in extension, UE use; 03/11/22 21 sec using upper extremities Goal status: IN PROGRESS    PLAN: PT FREQUENCY: 2x/week  PT DURATION: 6 weeks  PLANNED INTERVENTIONS: Therapeutic exercises, Therapeutic activity, Neuromuscular re-education, Balance training, Gait training,  Patient/Family education, Joint manipulation, Joint mobilization, Stair training, Orthotic/Fit training, DME instructions, Aquatic Therapy, Dry Needling, Electrical stimulation, Spinal manipulation, Spinal mobilization, Cryotherapy, Moist heat, Compression bandaging, scar mobilization, Splintting, Taping, Traction, Ultrasound, Ionotophoresis 4m/ml Dexamethasone, and Manual therapy  PLAN FOR NEXT SESSION: he sees Dr. DMarlou Sa9/18   9:44 AM, 03/22/22 Trase Bunda Small Alga Southall MPT [facility] [place] #[phone]:[phone]

## 2022-03-24 ENCOUNTER — Ambulatory Visit (HOSPITAL_COMMUNITY): Payer: 59

## 2022-03-24 DIAGNOSIS — M25562 Pain in left knee: Secondary | ICD-10-CM | POA: Diagnosis not present

## 2022-03-24 DIAGNOSIS — M6281 Muscle weakness (generalized): Secondary | ICD-10-CM | POA: Diagnosis not present

## 2022-03-24 DIAGNOSIS — M25662 Stiffness of left knee, not elsewhere classified: Secondary | ICD-10-CM | POA: Diagnosis not present

## 2022-03-24 NOTE — Therapy (Signed)
OUTPATIENT PHYSICAL THERAPY TREATMENT NOTE    Patient Name: Keith [name]. MRN: [id number] DOB:30-May-1941, 81 y.o., male Today's Date: 03/24/2022   PT End of Session - 03/24/22 0916     Visit Number 13    Number of Visits 24    Date for PT Re-Evaluation 05/08/22    Authorization Type Primary [employer](auth after 25th visit) Secondary: Medicare    Progress Note Due on Visit 20    PT Start Time 0904    PT Stop Time 0944    PT Time Calculation (min) 40 min    Activity Tolerance Patient tolerated treatment well    Behavior During Therapy [facility] for tasks assessed/performed                       Past Medical History:  Diagnosis Date   Arthritis    Atrial fibrillation ([place])    Cardioversion 2016   Atrial flutter ([place])    Cardioversion 2013   Blood transfusion without reported diagnosis    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   Dysrhythmia    GERD (gastroesophageal reflux disease)    History of aortic valve replacement with bioprosthetic valve    Aortic regurgitation   History of chicken pox    History of kidney stones    History of skin cancer    History of stroke 2011   Hypothyroidism    Kidney stone    Seasonal allergies    Stroke [facility])    after heart surgery 2010   Past Surgical History:  Procedure Laterality Date   AORTIC VALVE REPLACEMENT  2009   Bioprosthetic AVR and root replacement   BACK SURGERY     total of 4 back surgeries   CATARACT EXTRACTION, BILATERAL     COLONOSCOPY     CYSTOSCOPY WITH RETROGRADE PYELOGRAM, URETEROSCOPY AND STENT PLACEMENT Bilateral 10/01/2019   Procedure: CYSTOSCOPY WITH RETROGRADE PYELOGRAM, URETEROSCOPY AND STENT PLACEMENT;  Surgeon: Irine Seal, MD;  [facility];  Service: Urology;  Laterality: Bilateral;   EPIGASTRIC HERNIA REPAIR     HERNIA REPAIR     LUMBAR LAMINECTOMY/DECOMPRESSION MICRODISCECTOMY Right 06/01/2017   Procedure: EXTRAFORAMINAL MICRODISCECTOMY LUMBAR FIVE- SACRAL ONE, RIGHT;   Surgeon: Eustace Moore, MD;  Location: [place];  Service: Neurosurgery;  Laterality: Right;   LUMBAR LAMINECTOMY/DECOMPRESSION MICRODISCECTOMY Bilateral 09/04/2018   Procedure: Laminectomy and Foraminotomy - Lumbar two-three bilateral;  Surgeon: Eustace Moore, MD;  Location: [place];  Service: Neurosurgery;  Laterality: Bilateral;   REVERSE SHOULDER ARTHROPLASTY Left 09/01/2021   Procedure: LEFT REVERSE SHOULDER ARTHROPLASTY;  Surgeon: Meredith Pel, MD;  Location: [place];  Service: Orthopedics;  Laterality: Left;   TOTAL HIP ARTHROPLASTY Left 12/19/2020   Procedure: LEFT TOTAL HIP ARTHROPLASTY ANTERIOR APPROACH;  Surgeon: Mcarthur Rossetti, MD;  [facility];  Service: Orthopedics;  Laterality: Left;   TOTAL KNEE ARTHROPLASTY Left 12/31/2021   Procedure: TOTAL KNEE ARTHROPLASTY;  Surgeon: Meredith Pel, MD;  Location: [place];  Service: Orthopedics;  Laterality: Left;   TRANSFORAMINAL LUMBAR INTERBODY FUSION (TLIF) WITH PEDICLE SCREW FIXATION 1 LEVEL Left 06/15/2019   Procedure: Decompression and Instrumention Fusion Lumbar Four- Five;  Surgeon: Eustace Moore, MD;  Location: [place];  Service: Neurosurgery;  Laterality: Left;  Decompression and Instrumention Fusion Lumbar Four- Five   Patient Active Problem List   Diagnosis Date Noted   Arthritis of left knee    S/P TKR (total knee replacement), left 12/31/2021  Arthritis of left shoulder region    S/P reverse total shoulder arthroplasty, left 09/01/2021   Status post left hip replacement 12/19/2020   Primary osteoarthritis of left hip 12/04/2020   Hyperlipidemia 04/30/2020   Cataract 10/10/2019   Right ureteral stone 10/01/2019   Left ureteral stone 10/01/2019   S/P lumbar fusion 06/15/2019   Acquired hypothyroidism 08/10/2017   Benign prostatic hyperplasia with nocturia 08/10/2017   S/P lumbar laminectomy 06/01/2017   PAF (paroxysmal atrial fibrillation) ([place]) 09/07/2016   S/P AVR 01/18/2014   Hypertension 02/22/2013     PCP: Celene Squibb MD  REFERRING PROVIDER: Meredith Pel, MD   REFERRING DIAG: [phone] (ICD-10-CM) - Status post left knee replacement   THERAPY DIAG:  Left knee pain, unspecified chronicity  Stiffness of left knee, not elsewhere classified  Muscle weakness (generalized)  Rationale for Evaluation and Treatment Rehabilitation  ONSET DATE: 12/31/21   SUBJECTIVE:   SUBJECTIVE STATEMENT: No pain this morning but reports he is stiff; was compliant with prone hang stretch.   PERTINENT HISTORY: S/P left reverse shoulder arthroplasty completed on 09/01/21 by Dr. Marlou Sa., Hx afib, hx CVA, Hx skin cancer, L THA, Hx lumbar surgery  PAIN:  Are you having pain? Yes: NPRS scale: 0/10 Pain location: L knee  Pain description: sore Aggravating factors: exercise Relieving factors: rest  PRECAUTIONS: None  WEIGHT BEARING RESTRICTIONS No  FALLS:  Has patient fallen in last 6 months? 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   OBJECTIVE:   Observation:  L knee held in extension, Relies on RLE for transfers  PATIENT SURVEYS:  FOTO 65% function  COGNITION:  Overall cognitive status: Within functional limits for tasks assessed     SENSATION: WFL   POSTURE: rounded shoulders and forward head  PALPATION: TTP L quads  LOWER EXTREMITY ROM:  Active ROM Right eval Left eval Left  01/21/22 Left 01/26/22 Left 02/02/22 Left 02/09/22 Left 03/01/22 Left 03/11/22 Left 03/24/22  Hip flexion           Hip extension           Hip abduction           Hip adduction           Hip internal rotation           Hip external rotation           Knee flexion  62 83 improves to 88 with heel slides and 92 following contract relax  92 After Scar mass AROM 102 AROM 94 AAROM 96 AROM su0pine 105 AROM supine 98 AROM Supine 106  Knee extension  Lacking 3 Lacking 2 -2 2  -6 -7 -5  Ankle dorsiflexion           Ankle plantarflexion           Ankle inversion           Ankle eversion            (Blank rows = not tested)  LOWER EXTREMITY MMT:  MMT Right eval Left eval Left 03/11/22  Hip flexion 5 5   Hip extension     Hip abduction     Hip adduction     Hip internal rotation  Hip external rotation     Knee flexion 5 4+ 4  Knee extension 5 4+ 5  Ankle dorsiflexion 5 5   Ankle plantarflexion     Ankle inversion     Ankle eversion      (Blank rows = not tested)   FUNCTIONAL TESTS:  2 minute walk test: 275 feet  GAIT: Distance walked: 275 feet Assistive device utilized: Single point cane Level of assistance: Modified independence Comments: 2MWT, antalgic, limited knee flexion/extension ROM    TODAY'S TREATMENT: 03/24/22 Nustep seat 11 x 6'  Standing: Heel raises 2 x 10 12" step knee drives x 3' Squats 2 x 10 8" step ups 2 x 10 8" lateral step ups x 10 Walkouts 4 plates x 5 retro, sidestepping TKE 4 plates x 30 IT band stretch on step x 5   Sitting: Knee flexion eccentric x 20  Supine:  Knee extension -5, knee flexion 106         03/22/22 Nustep seat 11 x 5'  Seated: Heel slide x 2' Manual knee flexion x 10 reps with 5" hold Knee flexion eccentric; sliding bottom off mat 2 x 10   Standing: Knee drives for flexion 12" box x 2' Squats to chair 2 x 10 Heel/toe raises 2 x 10 Slant board 5 x 20" 6" step ups 2 x 10 Walkouts 4 plates retro and side to side x 5 reps each TKE GTB 2 x 10  Prone hang 3# x 3'        03/18/22 Nustep seat 11 x 5'  Standing: Heel/toe raises 2 x 10 Slant board 5 x 20" 12" box knee drives x 20 Squats to chair 2 x 10 Tandem stance 2 x 30" each 8" step ups 2 x 10 6" lateral step ups x 10  Prone hang 3# x 2'  Left knee AROM -6 to  105  03/16/22 STM  and manual knee extension to left knee in supine x 10' to improve tissue mobility Prone hang 1 lb x 1' Sitting manual knee flexion x 10 reps Bike seat 17 x 5'  Standing: Hamstring curls 3# 2 x 10 12" box knee flexion drives x 2' Heel/toe raises 2 x 10 Slant board 5 x 20" Squats to chair holding to // bars 2 x 10  Left knee AROM in supine: -7 to 102       03/11/22  Progress note 2 MWT 370 ft 5 times sit to stand 41 sec using bilateral upper extremities  Bike seat 16 x 5' 1/2 revolutions  Standing: // bars Heel/ toe raises 2 x 10 Knee drives 8" box x 20 Slant board 5 x 20" Steps over and back x 1 reciprocal pattern using bilateral handrails  Left knee extension AROM -7; knee flexion 98       03/01/20 Bike seat 16 x 5' 1/2 revolutions  Manual STM to decrease pain and swelling; patella mobs and manuel knee flexion extension in supine x 10' no other treatment performed during manual treatment  Supine:  Quad sets 5" x 10  Left knee ext -6, knee flexion 105 AROM supine today  Bike again seat 17 x 5' full revolutions x 5  Standing: Heel/ toe raises x 20 Slant board 3 x 20" Knee drives on 8" box x '1\' 8"'  lateral step ups x 10 TKE's GTB x 20          02/24/22  Bike seat 16 rocking with hold at end range x 5'  Manual STM to decrease pain and swelling; patella mobs and manuel knee flexion sitting and extension in supine x 12' no other treatment performed during manual treatment  Bike again seat 16 2 more minutes able to make full backward revolutions  Standing:   heel raises x 20 Slant board 3 x 30" Knee drives on 8" box x 10 8" step ups x 10        PATIENT EDUCATION:  Education details:03/18/22 updated HEP; 01/19/22 Patient educated on exam findings, POC, scope of PT, HEP, and LE positioning. 01/21/22 HEP Person educated: Patient Education method: Consulting civil engineer, Demonstration, and Handouts Education comprehension: verbalized  understanding, returned demonstration, verbal cues required, and tactile cues required    HOME EXERCISE PROGRAM: 03/18/22 Knee drives for flexion Prone hang 7/11/23Access Code: [email or web address] - Long Sitting Quad Set with Towel Roll Under Heel  - 3 x daily - 7 x weekly - 10 reps - 10 second hold - Supine Heel Slide with Strap  - 3-5 x daily - 7 x weekly - 10 reps - 10 second hold - Seated Heel Slide (Mirrored)  - 3-5 x daily - 7 x weekly - 10 reps - 10 second hold - Active Straight Leg Raise with Quad Set (Mirrored)  - 3 x daily - 7 x weekly - 3 sets - 10 reps  ASSESSMENT:  CLINICAL IMPRESSION: Today's session continued to focus on left knee mobility and strengthening. Noted swelling distal IT band so added IT band stretch today.  Slowly progressing mobility. Added weight to TKE's today; patient most painful with end range flexion.  Patient will benefit from continued skilled therapy services to address deficits and promote optimal function.   OBJECTIVE IMPAIRMENTS Abnormal gait, decreased balance, decreased mobility, difficulty walking, decreased ROM, decreased strength, increased edema, impaired flexibility, improper body mechanics, and pain.   ACTIVITY LIMITATIONS carrying, lifting, bending, standing, squatting, stairs, transfers, locomotion level, and caring for others  PARTICIPATION LIMITATIONS: meal prep, cleaning, laundry, driving, shopping, community activity, and yard work  PERSONAL FACTORS Age, Time since onset of injury/illness/exacerbation, and 3+ comorbidities: Hx afib, hx CVA, Hx skin cancer, L THA, Hx lumbar surgery, hx reverse shoulder replacement  are also affecting patient's functional outcome.   REHAB POTENTIAL: Good  CLINICAL DECISION MAKING: Stable/uncomplicated  EVALUATION COMPLEXITY: Low   GOALS: Goals reviewed with patient? Yes  SHORT TERM GOALS: Target date: 02/09/2022  Patient will be independent with HEP in order to improve functional outcomes. Baseline:  Goal  status: IN PROGRESS  2.  Patient will report at least 25% improvement in symptoms for improved quality of life. Baseline: "50%" improvement Goal status: met    LONG TERM GOALS: Target date: 03/02/2022  Patient will report at least 75% improvement in symptoms for improved quality of life. Baseline:  Goal status: IN PROGRESS  2.  Patient will improve FOTO score by at least 13 points in order to indicate improved tolerance to activity. Baseline: 7/11: 65% function Goal status: IN PROGRESS  3.  Patient will be able to navigate stairs with reciprocal pattern without compensation in order to demonstrate improved LE strength. Baseline:  Goal status: met  4.  Patient will be able to ambulate at least 400 feet in 2MWT in order to demonstrate improved tolerance to activity. Baseline: 275 feet; 03/11/22 370 ft Goal status: IN PROGRESS  5.  Patient will improve ROM for left knee extension/flexion to 0-115 degrees to improve squatting, and other functional mobility. Baseline: lacking 3 to 62 Goal status: IN PROGRESS  6.  Patient will be able to complete 5x STS in under 11.4 seconds without compensation in order to demonstrate improved functional strength. Baseline: transfers: relies RLE, left knee held in extension, UE use; 03/11/22 21 sec using upper extremities Goal status: IN PROGRESS    PLAN: PT FREQUENCY: 2x/week  PT DURATION: 6 weeks  PLANNED INTERVENTIONS: Therapeutic exercises, Therapeutic activity, Neuromuscular re-education, Balance training, Gait training, Patient/Family education, Joint manipulation, Joint mobilization, Stair training, Orthotic/Fit training, DME instructions, Aquatic Therapy, Dry Needling, Electrical stimulation, Spinal manipulation, Spinal mobilization, Cryotherapy, Moist heat, Compression bandaging, scar mobilization, Splintting, Taping, Traction, Ultrasound, Ionotophoresis 87m/ml Dexamethasone, and Manual therapy  PLAN FOR NEXT SESSION: he sees Dr. DMarlou Sa 9/18   9:49 AM, 03/24/22 Keith [name] Marisah Laker MPT  physical therapy [place] #[phone]:[phone]

## 2022-03-29 ENCOUNTER — Ambulatory Visit (INDEPENDENT_AMBULATORY_CARE_PROVIDER_SITE_OTHER): Payer: 59 | Admitting: Orthopedic Surgery

## 2022-03-29 ENCOUNTER — Telehealth (HOSPITAL_COMMUNITY): Payer: Self-pay | Admitting: Physical Therapy

## 2022-03-29 DIAGNOSIS — Z96652 Presence of left artificial knee joint: Secondary | ICD-10-CM

## 2022-03-29 NOTE — Telephone Encounter (Signed)
Pt went to see the MD and they decided the pt is fine and can be d/c now.

## 2022-03-30 ENCOUNTER — Encounter (HOSPITAL_COMMUNITY): Payer: 59 | Admitting: Physical Therapy

## 2022-03-31 DIAGNOSIS — E039 Hypothyroidism, unspecified: Secondary | ICD-10-CM | POA: Diagnosis not present

## 2022-03-31 DIAGNOSIS — I10 Essential (primary) hypertension: Secondary | ICD-10-CM | POA: Diagnosis not present

## 2022-04-01 ENCOUNTER — Encounter: Payer: Self-pay | Admitting: Orthopedic Surgery

## 2022-04-01 ENCOUNTER — Encounter (HOSPITAL_COMMUNITY): Payer: 59 | Admitting: Physical Therapy

## 2022-04-01 NOTE — Progress Notes (Signed)
Post-Op Visit Note   Patient: Keith [name].           Date of Birth: December 21, 1940           MRN: [id number] Visit Date: 03/29/2022 PCP: Celene Squibb, MD   Assessment & Plan:  Chief Complaint:  Chief Complaint  Patient presents with   Left Knee - Routine Post Op    left total knee replacement performed 12/31/2021   Visit Diagnoses:  1. Status post left knee replacement     Plan: [name] is an 81 year old patient who is now 3 months out left total knee replacement.  Doing well.  Feels like he is making good progress.  On examination he has range of motion 0-1 06.  Plan is to discontinue physical therapy and progress to home exercise program.  He did have an impact injury to his knee which has a braised the inferior aspect of the incision but there is no drainage erythema or fluctuance around that.  Looks like a superficial abrasion which should heal without consequence.  He will follow-up as needed.  Need for prophylaxis antibiotics prior to procedures over the next year discussed.  Follow-Up Instructions: No follow-ups on file.   Orders:  No orders of the defined types were placed in this encounter.  No orders of the defined types were placed in this encounter.   Imaging: No results found.  PMFS History: Patient Active Problem List   Diagnosis Date Noted   Arthritis of left knee    S/P TKR (total knee replacement), left 12/31/2021   Arthritis of left shoulder region    S/P reverse total shoulder arthroplasty, left 09/01/2021   Status post left hip replacement 12/19/2020   Primary osteoarthritis of left hip 12/04/2020   Hyperlipidemia 04/30/2020   Cataract 10/10/2019   Right ureteral stone 10/01/2019   Left ureteral stone 10/01/2019   S/P lumbar fusion 06/15/2019   Acquired hypothyroidism 08/10/2017   Benign prostatic hyperplasia with nocturia 08/10/2017   S/P lumbar laminectomy 06/01/2017   PAF (paroxysmal atrial fibrillation) ([place]) 09/07/2016   S/P AVR  01/18/2014   Hypertension 02/22/2013   Past Medical History:  Diagnosis Date   Arthritis    Atrial fibrillation ([place])    Cardioversion 2016   Atrial flutter ([place])    Cardioversion 2013   Blood transfusion without reported diagnosis    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   Dysrhythmia    GERD (gastroesophageal reflux disease)    History of aortic valve replacement with bioprosthetic valve    Aortic regurgitation   History of chicken pox    History of kidney stones    History of skin cancer    History of stroke 2011   Hypothyroidism    Kidney stone    Seasonal allergies    Stroke [facility])    after heart surgery 2010    Family History  Problem Relation Age of Onset   Colon cancer Neg Hx    Esophageal cancer Neg Hx    Rectal cancer Neg Hx    Stomach cancer Neg Hx     Past Surgical History:  Procedure Laterality Date   AORTIC VALVE REPLACEMENT  2009   Bioprosthetic AVR and root replacement   BACK SURGERY     total of 4 back surgeries   CATARACT EXTRACTION, BILATERAL     COLONOSCOPY     CYSTOSCOPY WITH RETROGRADE PYELOGRAM, URETEROSCOPY AND STENT PLACEMENT Bilateral 10/01/2019   Procedure: CYSTOSCOPY WITH RETROGRADE  PYELOGRAM, URETEROSCOPY AND STENT PLACEMENT;  Surgeon: Irine Seal, MD;  [facility];  Service: Urology;  Laterality: Bilateral;   EPIGASTRIC HERNIA REPAIR     HERNIA REPAIR     LUMBAR LAMINECTOMY/DECOMPRESSION MICRODISCECTOMY Right 06/01/2017   Procedure: EXTRAFORAMINAL MICRODISCECTOMY LUMBAR FIVE- SACRAL ONE, RIGHT;  Surgeon: Eustace Moore, MD;  Location: [place];  Service: Neurosurgery;  Laterality: Right;   LUMBAR LAMINECTOMY/DECOMPRESSION MICRODISCECTOMY Bilateral 09/04/2018   Procedure: Laminectomy and Foraminotomy - Lumbar two-three bilateral;  Surgeon: Eustace Moore, MD;  Location: [place];  Service: Neurosurgery;  Laterality: Bilateral;   REVERSE SHOULDER ARTHROPLASTY Left 09/01/2021   Procedure: LEFT REVERSE SHOULDER ARTHROPLASTY;  Surgeon: Meredith Pel, MD;  Location: [place];  Service: Orthopedics;  Laterality: Left;   TOTAL HIP ARTHROPLASTY Left 12/19/2020   Procedure: LEFT TOTAL HIP ARTHROPLASTY ANTERIOR APPROACH;  Surgeon: Mcarthur Rossetti, MD;  [facility];  Service: Orthopedics;  Laterality: Left;   TOTAL KNEE ARTHROPLASTY Left 12/31/2021   Procedure: TOTAL KNEE ARTHROPLASTY;  Surgeon: Meredith Pel, MD;  Location: [place];  Service: Orthopedics;  Laterality: Left;   TRANSFORAMINAL LUMBAR INTERBODY FUSION (TLIF) WITH PEDICLE SCREW FIXATION 1 LEVEL Left 06/15/2019   Procedure: Decompression and Instrumention Fusion Lumbar Four- Five;  Surgeon: Eustace Moore, MD;  Location: [place];  Service: Neurosurgery;  Laterality: Left;  Decompression and Instrumention Fusion Lumbar Four- Five   Social History   Occupational History   Occupation: Chief Strategy Officer  Tobacco Use   Smoking status: Never   Smokeless tobacco: Never  Vaping Use   Vaping Use: Never used  Substance and Sexual Activity   Alcohol use: Yes    Comment: occasional   Drug use: No   Sexual activity: Yes

## 2022-04-07 ENCOUNTER — Other Ambulatory Visit (HOSPITAL_COMMUNITY): Payer: Self-pay

## 2022-04-07 DIAGNOSIS — M1990 Unspecified osteoarthritis, unspecified site: Secondary | ICD-10-CM | POA: Diagnosis not present

## 2022-04-07 DIAGNOSIS — Z954 Presence of other heart-valve replacement: Secondary | ICD-10-CM | POA: Diagnosis not present

## 2022-04-07 DIAGNOSIS — Z8673 Personal history of transient ischemic attack (TIA), and cerebral infarction without residual deficits: Secondary | ICD-10-CM | POA: Diagnosis not present

## 2022-04-07 DIAGNOSIS — C4491 Basal cell carcinoma of skin, unspecified: Secondary | ICD-10-CM | POA: Diagnosis not present

## 2022-04-07 DIAGNOSIS — Z87442 Personal history of urinary calculi: Secondary | ICD-10-CM | POA: Diagnosis not present

## 2022-04-07 DIAGNOSIS — Z96649 Presence of unspecified artificial hip joint: Secondary | ICD-10-CM | POA: Diagnosis not present

## 2022-04-07 DIAGNOSIS — Z125 Encounter for screening for malignant neoplasm of prostate: Secondary | ICD-10-CM | POA: Diagnosis not present

## 2022-04-07 DIAGNOSIS — I10 Essential (primary) hypertension: Secondary | ICD-10-CM | POA: Diagnosis not present

## 2022-04-07 DIAGNOSIS — G5603 Carpal tunnel syndrome, bilateral upper limbs: Secondary | ICD-10-CM | POA: Diagnosis not present

## 2022-04-07 DIAGNOSIS — R7981 Abnormal blood-gas level: Secondary | ICD-10-CM | POA: Insufficient documentation

## 2022-04-07 DIAGNOSIS — M545 Low back pain, unspecified: Secondary | ICD-10-CM | POA: Diagnosis not present

## 2022-04-07 DIAGNOSIS — E039 Hypothyroidism, unspecified: Secondary | ICD-10-CM | POA: Diagnosis not present

## 2022-04-07 DIAGNOSIS — N400 Enlarged prostate without lower urinary tract symptoms: Secondary | ICD-10-CM | POA: Diagnosis not present

## 2022-04-07 MED ORDER — ELIQUIS 5 MG PO TABS
5.0000 mg | ORAL_TABLET | Freq: Two times a day (BID) | ORAL | 2 refills | Status: DC
  Filled 2022-04-07: qty 180, 90d supply, fill #0
  Filled 2022-04-16 – 2022-08-04 (×2): qty 180, 90d supply, fill #1
  Filled 2022-10-31 – 2023-01-30 (×3): qty 180, 90d supply, fill #2

## 2022-04-12 ENCOUNTER — Encounter (HOSPITAL_COMMUNITY): Payer: 59

## 2022-04-15 ENCOUNTER — Encounter (HOSPITAL_COMMUNITY): Payer: 59

## 2022-04-17 ENCOUNTER — Other Ambulatory Visit (HOSPITAL_COMMUNITY): Payer: Self-pay

## 2022-04-24 ENCOUNTER — Other Ambulatory Visit (HOSPITAL_COMMUNITY): Payer: Self-pay

## 2022-04-26 DIAGNOSIS — Z08 Encounter for follow-up examination after completed treatment for malignant neoplasm: Secondary | ICD-10-CM | POA: Diagnosis not present

## 2022-04-26 DIAGNOSIS — X32XXXA Exposure to sunlight, initial encounter: Secondary | ICD-10-CM | POA: Diagnosis not present

## 2022-04-26 DIAGNOSIS — D0461 Carcinoma in situ of skin of right upper limb, including shoulder: Secondary | ICD-10-CM | POA: Diagnosis not present

## 2022-04-26 DIAGNOSIS — L570 Actinic keratosis: Secondary | ICD-10-CM | POA: Diagnosis not present

## 2022-04-26 DIAGNOSIS — C4442 Squamous cell carcinoma of skin of scalp and neck: Secondary | ICD-10-CM | POA: Diagnosis not present

## 2022-04-26 DIAGNOSIS — Z85828 Personal history of other malignant neoplasm of skin: Secondary | ICD-10-CM | POA: Diagnosis not present

## 2022-04-27 ENCOUNTER — Other Ambulatory Visit (HOSPITAL_COMMUNITY): Payer: Self-pay

## 2022-04-30 ENCOUNTER — Other Ambulatory Visit (HOSPITAL_COMMUNITY): Payer: Self-pay

## 2022-04-30 MED ORDER — DOXYCYCLINE MONOHYDRATE 100 MG PO CAPS
100.0000 mg | ORAL_CAPSULE | Freq: Two times a day (BID) | ORAL | 1 refills | Status: DC
Start: 2022-04-26 — End: 2023-04-21
  Filled 2022-04-30: qty 28, 14d supply, fill #0
  Filled 2022-06-01: qty 28, 14d supply, fill #1

## 2022-05-05 DIAGNOSIS — Z125 Encounter for screening for malignant neoplasm of prostate: Secondary | ICD-10-CM | POA: Diagnosis not present

## 2022-05-05 DIAGNOSIS — Z23 Encounter for immunization: Secondary | ICD-10-CM | POA: Diagnosis not present

## 2022-05-11 DIAGNOSIS — Z6827 Body mass index (BMI) 27.0-27.9, adult: Secondary | ICD-10-CM | POA: Diagnosis not present

## 2022-05-11 DIAGNOSIS — G5602 Carpal tunnel syndrome, left upper limb: Secondary | ICD-10-CM | POA: Diagnosis not present

## 2022-05-18 DIAGNOSIS — N401 Enlarged prostate with lower urinary tract symptoms: Secondary | ICD-10-CM | POA: Diagnosis not present

## 2022-05-18 DIAGNOSIS — R972 Elevated prostate specific antigen [PSA]: Secondary | ICD-10-CM | POA: Diagnosis not present

## 2022-05-18 DIAGNOSIS — R3121 Asymptomatic microscopic hematuria: Secondary | ICD-10-CM | POA: Diagnosis not present

## 2022-05-18 DIAGNOSIS — R351 Nocturia: Secondary | ICD-10-CM | POA: Diagnosis not present

## 2022-05-28 ENCOUNTER — Encounter (HOSPITAL_COMMUNITY): Payer: Self-pay | Admitting: Occupational Therapy

## 2022-05-28 NOTE — Therapy (Signed)
[facility] [address] [place], [place], [address] Phone: [phone]   Fax:  [phone]  Patient Details  Name: Keith [name]. MRN: [id number] Date of Birth: Jul 17, 1940 Referring Provider:  No ref. provider found  Encounter Date: 05/28/2022   OCCUPATIONAL THERAPY DISCHARGE SUMMARY  Visits from Start of Care: 5  Current functional level related to goals / functional outcomes: Unknown. Pt did not return to therapy after last visit on 10/23/21. Pt discharging due to not returning since last visit.    Remaining deficits: Unknown.   Education / Equipment: HEP   Patient agrees to discharge. Patient goals were not met. Patient is being discharged due to not returning since the last visit.Marland Kitchen     Guadelupe Sabin, OTR/L  [phone] 05/28/2022, 8:29 PM  [facility] [address] [place], [place], [address] Phone: [phone]   Fax:  [phone]

## 2022-05-31 ENCOUNTER — Encounter (HOSPITAL_COMMUNITY): Payer: Self-pay | Admitting: Physical Therapy

## 2022-05-31 ENCOUNTER — Encounter: Payer: Self-pay | Admitting: Orthopedic Surgery

## 2022-05-31 NOTE — Therapy (Signed)
[facility] [address] [place], [place], [address] Phone: [phone]   Fax:  [phone]  Patient Details  Name: Keith [name]. MRN: [id number] Date of Birth: March 18, 1941 Referring Provider:  No ref. provider found  Encounter Date: 05/31/2022  PHYSICAL THERAPY DISCHARGE SUMMARY  Visits from Start of Care: 13  Current functional level related to goals / functional outcomes: Patient was last seen 03/24/22 and at that time he had met 1/2 short term goals and 1/6 long term goals at that time. Pt discharging due to not returning since last visit.     Remaining deficits: Unknown   Education / Equipment: HEP   Patient agrees to discharge. Patient goals were partially met. Patient is being discharged due to not returning since the last visit.   11:53 AM, 05/31/22 Mearl Latin PT, DPT Physical Therapist at [facility] [address] [place], [place], [address] Phone: [phone]   Fax:  [phone]

## 2022-06-01 ENCOUNTER — Other Ambulatory Visit (HOSPITAL_COMMUNITY): Payer: Self-pay

## 2022-06-01 DIAGNOSIS — G5602 Carpal tunnel syndrome, left upper limb: Secondary | ICD-10-CM | POA: Diagnosis not present

## 2022-06-01 MED ORDER — OXYCODONE-ACETAMINOPHEN 5-325 MG PO TABS
1.0000 | ORAL_TABLET | Freq: Four times a day (QID) | ORAL | 0 refills | Status: DC | PRN
Start: 2022-06-01 — End: 2023-04-21
  Filled 2022-06-01: qty 20, 5d supply, fill #0

## 2022-06-02 ENCOUNTER — Other Ambulatory Visit (HOSPITAL_COMMUNITY): Payer: Self-pay

## 2022-06-07 DIAGNOSIS — D0461 Carcinoma in situ of skin of right upper limb, including shoulder: Secondary | ICD-10-CM | POA: Diagnosis not present

## 2022-07-19 DIAGNOSIS — L928 Other granulomatous disorders of the skin and subcutaneous tissue: Secondary | ICD-10-CM | POA: Diagnosis not present

## 2022-07-19 DIAGNOSIS — Z08 Encounter for follow-up examination after completed treatment for malignant neoplasm: Secondary | ICD-10-CM | POA: Diagnosis not present

## 2022-07-19 DIAGNOSIS — Z85828 Personal history of other malignant neoplasm of skin: Secondary | ICD-10-CM | POA: Diagnosis not present

## 2022-08-05 ENCOUNTER — Other Ambulatory Visit (HOSPITAL_COMMUNITY): Payer: Self-pay

## 2022-08-05 ENCOUNTER — Other Ambulatory Visit: Payer: Self-pay

## 2022-08-09 DIAGNOSIS — Z08 Encounter for follow-up examination after completed treatment for malignant neoplasm: Secondary | ICD-10-CM | POA: Diagnosis not present

## 2022-08-09 DIAGNOSIS — Z85828 Personal history of other malignant neoplasm of skin: Secondary | ICD-10-CM | POA: Diagnosis not present

## 2022-08-09 DIAGNOSIS — D044 Carcinoma in situ of skin of scalp and neck: Secondary | ICD-10-CM | POA: Diagnosis not present

## 2022-08-09 DIAGNOSIS — L570 Actinic keratosis: Secondary | ICD-10-CM | POA: Diagnosis not present

## 2022-08-09 DIAGNOSIS — X32XXXD Exposure to sunlight, subsequent encounter: Secondary | ICD-10-CM | POA: Diagnosis not present

## 2022-08-11 DIAGNOSIS — R972 Elevated prostate specific antigen [PSA]: Secondary | ICD-10-CM | POA: Diagnosis not present

## 2022-08-18 ENCOUNTER — Other Ambulatory Visit (HOSPITAL_COMMUNITY): Payer: Self-pay

## 2022-08-18 DIAGNOSIS — R972 Elevated prostate specific antigen [PSA]: Secondary | ICD-10-CM | POA: Diagnosis not present

## 2022-08-18 DIAGNOSIS — R351 Nocturia: Secondary | ICD-10-CM | POA: Diagnosis not present

## 2022-08-18 DIAGNOSIS — N401 Enlarged prostate with lower urinary tract symptoms: Secondary | ICD-10-CM | POA: Diagnosis not present

## 2022-08-18 MED ORDER — TAMSULOSIN HCL 0.4 MG PO CAPS
0.4000 mg | ORAL_CAPSULE | Freq: Every day | ORAL | 3 refills | Status: DC
  Filled 2022-08-18: qty 100, 100d supply, fill #0
  Filled 2022-09-12: qty 90, 90d supply, fill #0
  Filled 2023-01-30 – 2023-04-12 (×2): qty 90, 90d supply, fill #1
  Filled 2023-07-08: qty 90, 90d supply, fill #2

## 2022-09-12 ENCOUNTER — Other Ambulatory Visit (HOSPITAL_COMMUNITY): Payer: Self-pay

## 2022-09-13 ENCOUNTER — Other Ambulatory Visit (HOSPITAL_COMMUNITY): Payer: Self-pay

## 2022-09-20 DIAGNOSIS — Z08 Encounter for follow-up examination after completed treatment for malignant neoplasm: Secondary | ICD-10-CM | POA: Diagnosis not present

## 2022-09-20 DIAGNOSIS — X32XXXD Exposure to sunlight, subsequent encounter: Secondary | ICD-10-CM | POA: Diagnosis not present

## 2022-09-20 DIAGNOSIS — Z85828 Personal history of other malignant neoplasm of skin: Secondary | ICD-10-CM | POA: Diagnosis not present

## 2022-09-20 DIAGNOSIS — L570 Actinic keratosis: Secondary | ICD-10-CM | POA: Diagnosis not present

## 2022-10-05 DIAGNOSIS — N400 Enlarged prostate without lower urinary tract symptoms: Secondary | ICD-10-CM | POA: Diagnosis not present

## 2022-10-05 DIAGNOSIS — E039 Hypothyroidism, unspecified: Secondary | ICD-10-CM | POA: Diagnosis not present

## 2022-10-05 DIAGNOSIS — I10 Essential (primary) hypertension: Secondary | ICD-10-CM | POA: Diagnosis not present

## 2022-10-05 DIAGNOSIS — E782 Mixed hyperlipidemia: Secondary | ICD-10-CM | POA: Diagnosis not present

## 2022-10-22 ENCOUNTER — Other Ambulatory Visit (HOSPITAL_COMMUNITY): Payer: Self-pay

## 2022-10-22 DIAGNOSIS — M1990 Unspecified osteoarthritis, unspecified site: Secondary | ICD-10-CM | POA: Diagnosis not present

## 2022-10-22 DIAGNOSIS — C4491 Basal cell carcinoma of skin, unspecified: Secondary | ICD-10-CM | POA: Diagnosis not present

## 2022-10-22 DIAGNOSIS — G609 Hereditary and idiopathic neuropathy, unspecified: Secondary | ICD-10-CM | POA: Diagnosis not present

## 2022-10-22 DIAGNOSIS — E039 Hypothyroidism, unspecified: Secondary | ICD-10-CM | POA: Diagnosis not present

## 2022-10-22 DIAGNOSIS — R7301 Impaired fasting glucose: Secondary | ICD-10-CM | POA: Diagnosis not present

## 2022-10-22 DIAGNOSIS — N1831 Chronic kidney disease, stage 3a: Secondary | ICD-10-CM | POA: Diagnosis not present

## 2022-10-22 DIAGNOSIS — I1310 Hypertensive heart and chronic kidney disease without heart failure, with stage 1 through stage 4 chronic kidney disease, or unspecified chronic kidney disease: Secondary | ICD-10-CM | POA: Diagnosis not present

## 2022-10-22 DIAGNOSIS — I10 Essential (primary) hypertension: Secondary | ICD-10-CM | POA: Diagnosis not present

## 2022-10-22 DIAGNOSIS — N400 Enlarged prostate without lower urinary tract symptoms: Secondary | ICD-10-CM | POA: Diagnosis not present

## 2022-10-22 DIAGNOSIS — Z954 Presence of other heart-valve replacement: Secondary | ICD-10-CM | POA: Diagnosis not present

## 2022-10-22 DIAGNOSIS — Z8673 Personal history of transient ischemic attack (TIA), and cerebral infarction without residual deficits: Secondary | ICD-10-CM | POA: Diagnosis not present

## 2022-10-22 DIAGNOSIS — Z0001 Encounter for general adult medical examination with abnormal findings: Secondary | ICD-10-CM | POA: Diagnosis not present

## 2022-10-22 MED ORDER — ELIQUIS 5 MG PO TABS
5.0000 mg | ORAL_TABLET | Freq: Two times a day (BID) | ORAL | 2 refills | Status: DC
  Filled 2022-10-22 – 2022-11-04 (×2): qty 180, 90d supply, fill #0
  Filled 2023-04-12: qty 180, 90d supply, fill #1
  Filled 2023-07-08: qty 180, 90d supply, fill #2

## 2022-10-22 MED ORDER — LEVOTHYROXINE SODIUM 75 MCG PO TABS
75.0000 ug | ORAL_TABLET | Freq: Every day | ORAL | 2 refills | Status: DC
  Filled 2022-10-22: qty 30, 30d supply, fill #0
  Filled 2022-11-18: qty 30, 30d supply, fill #1
  Filled 2022-12-25: qty 30, 30d supply, fill #2

## 2022-10-23 ENCOUNTER — Other Ambulatory Visit (HOSPITAL_COMMUNITY): Payer: Self-pay

## 2022-10-31 ENCOUNTER — Other Ambulatory Visit (HOSPITAL_COMMUNITY): Payer: Self-pay

## 2022-11-01 ENCOUNTER — Other Ambulatory Visit (HOSPITAL_COMMUNITY): Payer: Self-pay

## 2022-11-01 MED ORDER — LISINOPRIL-HYDROCHLOROTHIAZIDE 10-12.5 MG PO TABS
1.0000 | ORAL_TABLET | Freq: Every day | ORAL | 2 refills | Status: DC
Start: 2022-11-01 — End: 2023-07-09
  Filled 2022-11-01: qty 90, 90d supply, fill #0
  Filled 2023-01-30: qty 90, 90d supply, fill #1
  Filled 2023-04-12: qty 90, 90d supply, fill #2

## 2022-11-03 ENCOUNTER — Other Ambulatory Visit (HOSPITAL_COMMUNITY): Payer: Self-pay

## 2022-11-04 ENCOUNTER — Other Ambulatory Visit: Payer: Self-pay

## 2022-11-04 ENCOUNTER — Other Ambulatory Visit (HOSPITAL_COMMUNITY): Payer: Self-pay

## 2022-11-05 ENCOUNTER — Other Ambulatory Visit (HOSPITAL_COMMUNITY): Payer: Self-pay

## 2022-11-18 ENCOUNTER — Other Ambulatory Visit (HOSPITAL_COMMUNITY): Payer: Self-pay

## 2022-11-18 ENCOUNTER — Other Ambulatory Visit: Payer: Self-pay

## 2022-11-18 MED ORDER — LEVOTHYROXINE SODIUM 50 MCG PO TABS
50.0000 ug | ORAL_TABLET | Freq: Every day | ORAL | 2 refills | Status: DC
  Filled 2022-11-18: qty 90, 90d supply, fill #0
  Filled 2023-04-12: qty 90, 90d supply, fill #1

## 2022-11-20 ENCOUNTER — Other Ambulatory Visit (HOSPITAL_COMMUNITY): Payer: Self-pay

## 2022-11-23 ENCOUNTER — Other Ambulatory Visit: Payer: Self-pay

## 2022-11-27 ENCOUNTER — Other Ambulatory Visit (HOSPITAL_COMMUNITY): Payer: Self-pay

## 2022-12-13 DIAGNOSIS — E349 Endocrine disorder, unspecified: Secondary | ICD-10-CM | POA: Diagnosis not present

## 2022-12-13 DIAGNOSIS — R972 Elevated prostate specific antigen [PSA]: Secondary | ICD-10-CM | POA: Diagnosis not present

## 2022-12-20 DIAGNOSIS — N401 Enlarged prostate with lower urinary tract symptoms: Secondary | ICD-10-CM | POA: Diagnosis not present

## 2022-12-20 DIAGNOSIS — N200 Calculus of kidney: Secondary | ICD-10-CM | POA: Diagnosis not present

## 2022-12-20 DIAGNOSIS — R351 Nocturia: Secondary | ICD-10-CM | POA: Diagnosis not present

## 2022-12-20 DIAGNOSIS — R972 Elevated prostate specific antigen [PSA]: Secondary | ICD-10-CM | POA: Diagnosis not present

## 2022-12-20 DIAGNOSIS — R310 Gross hematuria: Secondary | ICD-10-CM | POA: Diagnosis not present

## 2022-12-29 ENCOUNTER — Ambulatory Visit
Admission: RE | Admit: 2022-12-29 | Discharge: 2022-12-29 | Disposition: A | Discharge status: Home / Self Care | Payer: 59 | Source: Ambulatory Visit | Attending: Family Medicine | Admitting: Family Medicine

## 2022-12-29 ENCOUNTER — Other Ambulatory Visit: Payer: Self-pay

## 2022-12-29 ENCOUNTER — Ambulatory Visit: Payer: 59

## 2022-12-29 VITALS — BP 124/70 | HR 60 | Temp 98.0°F | Resp 20

## 2022-12-29 DIAGNOSIS — M25461 Effusion, right knee: Secondary | ICD-10-CM | POA: Diagnosis not present

## 2022-12-29 DIAGNOSIS — R2241 Localized swelling, mass and lump, right lower limb: Secondary | ICD-10-CM | POA: Diagnosis not present

## 2022-12-29 DIAGNOSIS — M1711 Unilateral primary osteoarthritis, right knee: Secondary | ICD-10-CM | POA: Diagnosis not present

## 2022-12-29 NOTE — ED Triage Notes (Signed)
Pt reports "knot" to right knee since sunday. Denies any known injury. No redness noted but reports pain with walking.

## 2022-12-29 NOTE — ED Provider Notes (Signed)
[facility]   [id number] 12/29/22 Arrival Time: 4098  ASSESSMENT & PLAN:  1. Mass of knee, right    Unclear what this protrusion of his left medial knee is. Radiology rec MRI. Has f/u with his orthopaedist in a few weeks. Will discuss at that time.  I have personally viewed and independently interpreted the imaging studies ordered this visit. Mild STS of R medial knee. No bony abnormalities.  Tylenol if needed.  Reviewed expectations re: course of current medical issues. Questions answered. Outlined signs and symptoms indicating need for more acute intervention. Patient verbalized understanding. After Visit Summary given.  SUBJECTIVE: History from: patient. Keith [name]. is a 82 y.o. male who reports "knot" to right knee; x 3-4 days. Denies any known injury. No redness noted but reports pain with walking. Stable size. Denies fever.   Past Surgical History:  Procedure Laterality Date   AORTIC VALVE REPLACEMENT  2009   Bioprosthetic AVR and root replacement   BACK SURGERY     total of 4 back surgeries   BILATERAL CARPAL TUNNEL RELEASE Bilateral    2022,2023   CATARACT EXTRACTION, BILATERAL     COLONOSCOPY     CYSTOSCOPY WITH RETROGRADE PYELOGRAM, URETEROSCOPY AND STENT PLACEMENT Bilateral 10/01/2019   Procedure: CYSTOSCOPY WITH RETROGRADE PYELOGRAM, URETEROSCOPY AND STENT PLACEMENT;  Surgeon: Bjorn Pippin, MD;  Location: [facility];  Service: Urology;  Laterality: Bilateral;   EPIGASTRIC HERNIA REPAIR     HERNIA REPAIR     LUMBAR LAMINECTOMY/DECOMPRESSION MICRODISCECTOMY Right 06/01/2017   Procedure: EXTRAFORAMINAL MICRODISCECTOMY LUMBAR FIVE- SACRAL ONE, RIGHT;  Surgeon: Tia Alert, MD;  Location: [facility] OR;  Service: Neurosurgery;  Laterality: Right;   LUMBAR LAMINECTOMY/DECOMPRESSION MICRODISCECTOMY Bilateral 09/04/2018   Procedure: Laminectomy and Foraminotomy - Lumbar two-three bilateral;  Surgeon: Tia Alert, MD;  Location: [facility] OR;  Service:  Neurosurgery;  Laterality: Bilateral;   REVERSE SHOULDER ARTHROPLASTY Left 09/01/2021   Procedure: LEFT REVERSE SHOULDER ARTHROPLASTY;  Surgeon: Cammy Copa, MD;  Location: [facility] OR;  Service: Orthopedics;  Laterality: Left;   TOTAL HIP ARTHROPLASTY Left 12/19/2020   Procedure: LEFT TOTAL HIP ARTHROPLASTY ANTERIOR APPROACH;  Surgeon: Kathryne Hitch, MD;  [facility];  Service: Orthopedics;  Laterality: Left;   TOTAL KNEE ARTHROPLASTY Left 12/31/2021   Procedure: TOTAL KNEE ARTHROPLASTY;  Surgeon: Cammy Copa, MD;  Location: [facility] OR;  Service: Orthopedics;  Laterality: Left;   TRANSFORAMINAL LUMBAR INTERBODY FUSION (TLIF) WITH PEDICLE SCREW FIXATION 1 LEVEL Left 06/15/2019   Procedure: Decompression and Instrumention Fusion Lumbar Four- Five;  Surgeon: Tia Alert, MD;  Location: [facility] OR;  Service: Neurosurgery;  Laterality: Left;  Decompression and Instrumention Fusion Lumbar Four- Five      OBJECTIVE:  Vitals:   12/29/22 0846  BP: 124/70  Pulse: 60  Resp: 20  Temp: 98 F (36.7 C)  TempSrc: Oral  SpO2: 95%    General appearance: alert; no distress HEENT: [place]; AT Neck: supple with FROM Resp: unlabored respirations Extremities: RLE: warm with well perfused appearance; approx 1x1 cm firm protrusion/induration over R medial knee; no overlying skin changes; no bruising; is TTP Neurologic: gait normal; normal sensation and strength of RLE Psychological: alert and cooperative; normal mood and affect  Imaging: DG Knee 2 Views Right  Result Date: 12/29/2022 CLINICAL DATA:  Mass on the medial right knee. EXAM: RIGHT KNEE - 1-2 VIEW COMPARISON:  None Available. FINDINGS: No evidence for an acute fracture. No subluxation or dislocation. Loss of joint space noted medial  compartment with hypertrophic spurring visible in all 3 compartments. Small joint effusion evident. Calcification in the popliteal fossa may be a loose body or soft tissue calcification. Subtle soft  tissue fullness noted medial to the knee. IMPRESSION: 1. Tricompartmental degenerative changes without acute bony abnormality. 2. Small joint effusion. 3. Subtle soft tissue fullness medial to the knee. MRI with and without contrast would be the study of choice to further evaluate as clinically warranted. Electronically Signed   By: Kennith Center M.D.   On: 12/29/2022 09:23      Allergies  Allergen Reactions   Tape Rash    Tears skin     Past Medical History:  Diagnosis Date   Arthritis    Atrial fibrillation [facility])    Cardioversion 2016   Atrial flutter (HCC)    Cardioversion 2013   Blood transfusion without reported diagnosis    Cancer (HCC)    Skin- Basil/Squamous cell   Dysrhythmia    GERD (gastroesophageal reflux disease)    History of aortic valve replacement with bioprosthetic valve    Aortic regurgitation   History of chicken pox    History of kidney stones    History of skin cancer    History of stroke 2011   Hypothyroidism    Kidney stone    Seasonal allergies    Stroke [facility])    after heart surgery 2010   Social History   Socioeconomic History   Marital status: Married    Spouse name: Okey Regal   Number of children: 1   Years of education: Not on file   Highest education level: Not on file  Occupational History   Occupation: Surveyor, minerals  Tobacco Use   Smoking status: Never   Smokeless tobacco: Never  Vaping Use   Vaping Use: Never used  Substance and Sexual Activity   Alcohol use: Yes    Comment: occasional   Drug use: No   Sexual activity: Yes  Other Topics Concern   Not on file  Social History Narrative   Not on file   Social Determinants of Health   Financial Resource Strain: Low Risk  (02/26/2020)   Overall Financial Resource Strain (CARDIA)    Difficulty of Paying Living Expenses: Not hard at all  Food Insecurity: No Food Insecurity (02/26/2020)   Hunger Vital Sign    Worried About Running Out of Food in the Last Year: Never true    Ran Out of  Food in the Last Year: Never true  Transportation Needs: No Transportation Needs (02/26/2020)   PRAPARE - Administrator, Civil Service (Medical): No    Lack of Transportation (Non-Medical): No  Physical Activity: Inactive (02/26/2020)   Exercise Vital Sign    Days of Exercise per Week: 0 days    Minutes of Exercise per Session: 0 min  Stress: No Stress Concern Present (02/26/2020)   [employer] of Occupational Health - Occupational Stress Questionnaire    Feeling of Stress : Not at all  Social Connections: Moderately Integrated (02/26/2020)   Social Connection and Isolation Panel [NHANES]    Frequency of Communication with Friends and Family: More than three times a week    Frequency of Social Gatherings with Friends and Family: Once a week    Attends Religious Services: Never    Database administrator or Organizations: Yes    Attends Engineer, structural: More than 4 times per year    Marital Status: Married   Family History  Problem Relation  Age of Onset   Colon cancer Neg Hx    Esophageal cancer Neg Hx    Rectal cancer Neg Hx    Stomach cancer Neg Hx    Past Surgical History:  Procedure Laterality Date   AORTIC VALVE REPLACEMENT  2009   Bioprosthetic AVR and root replacement   BACK SURGERY     total of 4 back surgeries   BILATERAL CARPAL TUNNEL RELEASE Bilateral    2022,2023   CATARACT EXTRACTION, BILATERAL     COLONOSCOPY     CYSTOSCOPY WITH RETROGRADE PYELOGRAM, URETEROSCOPY AND STENT PLACEMENT Bilateral 10/01/2019   Procedure: CYSTOSCOPY WITH RETROGRADE PYELOGRAM, URETEROSCOPY AND STENT PLACEMENT;  Surgeon: Bjorn Pippin, MD;  [facility];  Service: Urology;  Laterality: Bilateral;   EPIGASTRIC HERNIA REPAIR     HERNIA REPAIR     LUMBAR LAMINECTOMY/DECOMPRESSION MICRODISCECTOMY Right 06/01/2017   Procedure: EXTRAFORAMINAL MICRODISCECTOMY LUMBAR FIVE- SACRAL ONE, RIGHT;  Surgeon: Tia Alert, MD;  Location: [facility] OR;  Service: Neurosurgery;   Laterality: Right;   LUMBAR LAMINECTOMY/DECOMPRESSION MICRODISCECTOMY Bilateral 09/04/2018   Procedure: Laminectomy and Foraminotomy - Lumbar two-three bilateral;  Surgeon: Tia Alert, MD;  Location: [facility] OR;  Service: Neurosurgery;  Laterality: Bilateral;   REVERSE SHOULDER ARTHROPLASTY Left 09/01/2021   Procedure: LEFT REVERSE SHOULDER ARTHROPLASTY;  Surgeon: Cammy Copa, MD;  Location: [facility] OR;  Service: Orthopedics;  Laterality: Left;   TOTAL HIP ARTHROPLASTY Left 12/19/2020   Procedure: LEFT TOTAL HIP ARTHROPLASTY ANTERIOR APPROACH;  Surgeon: Kathryne Hitch, MD;  [facility];  Service: Orthopedics;  Laterality: Left;   TOTAL KNEE ARTHROPLASTY Left 12/31/2021   Procedure: TOTAL KNEE ARTHROPLASTY;  Surgeon: Cammy Copa, MD;  Location: [facility] OR;  Service: Orthopedics;  Laterality: Left;   TRANSFORAMINAL LUMBAR INTERBODY FUSION (TLIF) WITH PEDICLE SCREW FIXATION 1 LEVEL Left 06/15/2019   Procedure: Decompression and Instrumention Fusion Lumbar Four- Five;  Surgeon: Tia Alert, MD;  Location: [facility] OR;  Service: Neurosurgery;  Laterality: Left;  Decompression and Instrumention Fusion Lumbar Four- Five       Mardella Layman, MD 12/29/22 1027

## 2023-01-06 DIAGNOSIS — L570 Actinic keratosis: Secondary | ICD-10-CM | POA: Diagnosis not present

## 2023-01-06 DIAGNOSIS — X32XXXD Exposure to sunlight, subsequent encounter: Secondary | ICD-10-CM | POA: Diagnosis not present

## 2023-01-07 DIAGNOSIS — N400 Enlarged prostate without lower urinary tract symptoms: Secondary | ICD-10-CM | POA: Diagnosis not present

## 2023-01-07 DIAGNOSIS — R310 Gross hematuria: Secondary | ICD-10-CM | POA: Diagnosis not present

## 2023-01-07 DIAGNOSIS — N200 Calculus of kidney: Secondary | ICD-10-CM | POA: Diagnosis not present

## 2023-01-07 DIAGNOSIS — K5730 Diverticulosis of large intestine without perforation or abscess without bleeding: Secondary | ICD-10-CM | POA: Diagnosis not present

## 2023-01-08 ENCOUNTER — Other Ambulatory Visit (HOSPITAL_COMMUNITY): Payer: Self-pay

## 2023-01-08 MED ORDER — FLUOROURACIL 5 % EX CREA
1.0000 | TOPICAL_CREAM | Freq: Every day | CUTANEOUS | 99 refills | Status: AC | PRN
  Filled 2023-01-08: qty 40, 21d supply, fill #0
  Filled 2023-05-28: qty 40, 21d supply, fill #1
  Filled 2023-07-08: qty 40, 21d supply, fill #2
  Filled 2023-10-23: qty 40, 21d supply, fill #3

## 2023-01-10 ENCOUNTER — Other Ambulatory Visit (HOSPITAL_COMMUNITY): Payer: Self-pay

## 2023-01-12 ENCOUNTER — Ambulatory Visit (INDEPENDENT_AMBULATORY_CARE_PROVIDER_SITE_OTHER): Payer: 59 | Admitting: Orthopedic Surgery

## 2023-01-12 ENCOUNTER — Other Ambulatory Visit (HOSPITAL_COMMUNITY): Payer: Self-pay

## 2023-01-12 DIAGNOSIS — M23003 Cystic meniscus, unspecified medial meniscus, right knee: Secondary | ICD-10-CM

## 2023-01-12 DIAGNOSIS — M1612 Unilateral primary osteoarthritis, left hip: Secondary | ICD-10-CM

## 2023-01-12 DIAGNOSIS — M1712 Unilateral primary osteoarthritis, left knee: Secondary | ICD-10-CM | POA: Diagnosis not present

## 2023-01-12 DIAGNOSIS — M1611 Unilateral primary osteoarthritis, right hip: Secondary | ICD-10-CM

## 2023-01-13 ENCOUNTER — Encounter: Payer: Self-pay | Admitting: Orthopedic Surgery

## 2023-01-13 NOTE — Progress Notes (Signed)
Office Visit Note   Patient: Keith [name].           Date of Birth: [birth year]           MRN: [id number] Visit Date: 01/12/2023 Requested by: Cammy Copa, MD [address] [place],  [place] [address] PCP: Benita Stabile, MD  Subjective: Chief Complaint  Patient presents with   Right Knee - Pain    HPI: [name]. is a 82 y.o. male who presents to the office reporting right knee mass which occurred at the level of the medial joint line about 2 weeks ago.  Went to urgent care.  Denies any history of injury.  This mass has become smaller in size over the past 2 weeks.  Initially was tender to touch but now less symptomatic.  He did ice it for 2 days.  He is doing well from his left knee replacement and left shoulder replacement..                ROS: All systems reviewed are negative as they relate to the chief complaint within the history of present illness.  Patient denies fevers or chills.  Assessment & Plan: Visit Diagnoses:  1. Cyst of medial meniscus of right knee   2. Primary osteoarthritis of left knee   3. Primary osteoarthritis of right hip   4. Primary osteoarthritis of left hip     Plan: Impression is right knee medial meniscal cyst in the face of fairly significant arthritis in that right knee.  Ultrasound examination confirms cystic nature of this 1/2 x 1/2 cm mass.  No intervention required because this is asymptomatic at this time.  If it became larger and became more symptomatic we could consider ultrasound-guided aspiration and injection of the cyst.  He will follow-up as needed.  Follow-Up Instructions: No follow-ups on file.   Orders:  No orders of the defined types were placed in this encounter.  No orders of the defined types were placed in this encounter.     Procedures: No procedures performed   Clinical Data: No additional findings.  Objective: Vital Signs: There were no vitals taken for this visit.  Physical Exam:   Constitutional: Patient appears well-developed HEENT:  Head: Normocephalic Eyes:EOM are normal Neck: Normal range of motion Cardiovascular: Normal rate Pulmonary/chest: Effort normal Neurologic: Patient is alert Skin: Skin is warm Psychiatric: Patient has normal mood and affect  Ortho Exam: Ortho exam demonstrates palpable one half and 1/2 cm cyst at the medial joint line over the medial meniscus.  Nontender to palpation.  No induration erythema or fluctuance is present.  Ultrasound examination demonstrates multiloculated cyst with no calcifications.  This is at the level of the meniscus on the medial side.  No right knee effusion is present and collateral and cruciate ligaments are stable.  Specialty Comments:  No specialty comments available.  Imaging: No results found.   PMFS History: Patient Active Problem List   Diagnosis Date Noted   Arthritis of left knee    S/P TKR (total knee replacement), left 12/31/2021   Arthritis of left shoulder region    S/P reverse total shoulder arthroplasty, left 09/01/2021   Status post left hip replacement 12/19/2020   Primary osteoarthritis of left hip 12/04/2020   Hyperlipidemia 04/30/2020   Cataract 10/10/2019   Right ureteral stone 10/01/2019   Left ureteral stone 10/01/2019   S/P lumbar fusion 06/15/2019   Acquired hypothyroidism 08/10/2017   Benign prostatic hyperplasia with  nocturia 08/10/2017   S/P lumbar laminectomy 06/01/2017   PAF (paroxysmal atrial fibrillation) (HCC) 09/07/2016   S/P AVR 01/18/2014   Hypertension 02/22/2013   Past Medical History:  Diagnosis Date   Arthritis    Atrial fibrillation [facility])    Cardioversion 2016   Atrial flutter (HCC)    Cardioversion 2013   Blood transfusion without reported diagnosis    Cancer (HCC)    Skin- Basil/Squamous cell   Dysrhythmia    GERD (gastroesophageal reflux disease)    History of aortic valve replacement with bioprosthetic valve    Aortic regurgitation   History  of chicken pox    History of kidney stones    History of skin cancer    History of stroke 2011   Hypothyroidism    Kidney stone    Seasonal allergies    Stroke [facility])    after heart surgery 2010    Family History  Problem Relation Age of Onset   Colon cancer Neg Hx    Esophageal cancer Neg Hx    Rectal cancer Neg Hx    Stomach cancer Neg Hx     Past Surgical History:  Procedure Laterality Date   AORTIC VALVE REPLACEMENT  2009   Bioprosthetic AVR and root replacement   BACK SURGERY     total of 4 back surgeries   BILATERAL CARPAL TUNNEL RELEASE Bilateral    2022,2023   CATARACT EXTRACTION, BILATERAL     COLONOSCOPY     CYSTOSCOPY WITH RETROGRADE PYELOGRAM, URETEROSCOPY AND STENT PLACEMENT Bilateral 10/01/2019   Procedure: CYSTOSCOPY WITH RETROGRADE PYELOGRAM, URETEROSCOPY AND STENT PLACEMENT;  Surgeon: Bjorn Pippin, MD;  [facility];  Service: Urology;  Laterality: Bilateral;   EPIGASTRIC HERNIA REPAIR     HERNIA REPAIR     LUMBAR LAMINECTOMY/DECOMPRESSION MICRODISCECTOMY Right 06/01/2017   Procedure: EXTRAFORAMINAL MICRODISCECTOMY LUMBAR FIVE- SACRAL ONE, RIGHT;  Surgeon: Tia Alert, MD;  Location: [facility] OR;  Service: Neurosurgery;  Laterality: Right;   LUMBAR LAMINECTOMY/DECOMPRESSION MICRODISCECTOMY Bilateral 09/04/2018   Procedure: Laminectomy and Foraminotomy - Lumbar two-three bilateral;  Surgeon: Tia Alert, MD;  Location: [facility] OR;  Service: Neurosurgery;  Laterality: Bilateral;   REVERSE SHOULDER ARTHROPLASTY Left 09/01/2021   Procedure: LEFT REVERSE SHOULDER ARTHROPLASTY;  Surgeon: Cammy Copa, MD;  Location: [facility] OR;  Service: Orthopedics;  Laterality: Left;   TOTAL HIP ARTHROPLASTY Left 12/19/2020   Procedure: LEFT TOTAL HIP ARTHROPLASTY ANTERIOR APPROACH;  Surgeon: Kathryne Hitch, MD;  [facility];  Service: Orthopedics;  Laterality: Left;   TOTAL KNEE ARTHROPLASTY Left 12/31/2021   Procedure: TOTAL KNEE ARTHROPLASTY;  Surgeon: Cammy Copa, MD;  Location: [facility] OR;  Service: Orthopedics;  Laterality: Left;   TRANSFORAMINAL LUMBAR INTERBODY FUSION (TLIF) WITH PEDICLE SCREW FIXATION 1 LEVEL Left 06/15/2019   Procedure: Decompression and Instrumention Fusion Lumbar Four- Five;  Surgeon: Tia Alert, MD;  Location: [facility] OR;  Service: Neurosurgery;  Laterality: Left;  Decompression and Instrumention Fusion Lumbar Four- Five   Social History   Occupational History   Occupation: Surveyor, minerals  Tobacco Use   Smoking status: Never   Smokeless tobacco: Never  Vaping Use   Vaping Use: Never used  Substance and Sexual Activity   Alcohol use: Yes    Comment: occasional   Drug use: No   Sexual activity: Yes

## 2023-01-21 ENCOUNTER — Other Ambulatory Visit (HOSPITAL_COMMUNITY): Payer: Self-pay

## 2023-01-21 DIAGNOSIS — R972 Elevated prostate specific antigen [PSA]: Secondary | ICD-10-CM | POA: Diagnosis not present

## 2023-01-21 DIAGNOSIS — N200 Calculus of kidney: Secondary | ICD-10-CM | POA: Diagnosis not present

## 2023-01-21 DIAGNOSIS — N401 Enlarged prostate with lower urinary tract symptoms: Secondary | ICD-10-CM | POA: Diagnosis not present

## 2023-01-21 DIAGNOSIS — R3912 Poor urinary stream: Secondary | ICD-10-CM | POA: Diagnosis not present

## 2023-01-21 DIAGNOSIS — R310 Gross hematuria: Secondary | ICD-10-CM | POA: Diagnosis not present

## 2023-01-21 MED ORDER — FINASTERIDE 5 MG PO TABS
5.0000 mg | ORAL_TABLET | Freq: Every day | ORAL | 3 refills | Status: DC
  Filled 2023-01-21: qty 90, 90d supply, fill #0
  Filled 2023-04-12: qty 90, 90d supply, fill #1
  Filled 2023-07-08: qty 90, 90d supply, fill #2

## 2023-01-30 ENCOUNTER — Other Ambulatory Visit (HOSPITAL_COMMUNITY): Payer: Self-pay

## 2023-01-31 ENCOUNTER — Other Ambulatory Visit (HOSPITAL_COMMUNITY): Payer: Self-pay

## 2023-01-31 ENCOUNTER — Other Ambulatory Visit: Payer: Self-pay

## 2023-01-31 MED ORDER — LEVOTHYROXINE SODIUM 75 MCG PO TABS: 75.0000 ug | ORAL_TABLET | Freq: Every day | ORAL | 2 refills | Status: DC

## 2023-01-31 MED ORDER — LEVOTHYROXINE SODIUM 75 MCG PO TABS
75.0000 ug | ORAL_TABLET | Freq: Every day | ORAL | 2 refills | Status: DC
  Filled 2023-01-31: qty 90, 90d supply, fill #0

## 2023-04-12 ENCOUNTER — Other Ambulatory Visit (HOSPITAL_COMMUNITY): Payer: Self-pay

## 2023-04-13 ENCOUNTER — Other Ambulatory Visit (HOSPITAL_COMMUNITY): Payer: Self-pay

## 2023-04-13 ENCOUNTER — Other Ambulatory Visit: Payer: Self-pay

## 2023-04-13 DIAGNOSIS — N401 Enlarged prostate with lower urinary tract symptoms: Secondary | ICD-10-CM | POA: Diagnosis not present

## 2023-04-13 MED ORDER — LEVOTHYROXINE SODIUM 75 MCG PO TABS
75.0000 ug | ORAL_TABLET | Freq: Every day | ORAL | 2 refills | Status: DC
  Filled 2023-04-13: qty 30, 30d supply, fill #0
  Filled 2023-05-28: qty 30, 30d supply, fill #1
  Filled 2023-07-03: qty 30, 30d supply, fill #2

## 2023-04-14 ENCOUNTER — Other Ambulatory Visit (HOSPITAL_COMMUNITY): Payer: Self-pay

## 2023-04-15 ENCOUNTER — Other Ambulatory Visit (HOSPITAL_COMMUNITY): Payer: Self-pay

## 2023-04-20 DIAGNOSIS — R972 Elevated prostate specific antigen [PSA]: Secondary | ICD-10-CM | POA: Diagnosis not present

## 2023-04-20 DIAGNOSIS — N200 Calculus of kidney: Secondary | ICD-10-CM | POA: Diagnosis not present

## 2023-04-20 DIAGNOSIS — R351 Nocturia: Secondary | ICD-10-CM | POA: Diagnosis not present

## 2023-04-20 DIAGNOSIS — R310 Gross hematuria: Secondary | ICD-10-CM | POA: Diagnosis not present

## 2023-04-20 DIAGNOSIS — N401 Enlarged prostate with lower urinary tract symptoms: Secondary | ICD-10-CM | POA: Diagnosis not present

## 2023-04-21 ENCOUNTER — Other Ambulatory Visit: Payer: Self-pay | Admitting: Urology

## 2023-04-25 NOTE — Progress Notes (Signed)
Sent message, via epic in basket, requesting orders in epic from surgeon.  

## 2023-04-26 NOTE — Progress Notes (Signed)
COVID Vaccine received:  []  No [x]  Yes Date of any COVID positive Test in last 90 days:  PCP - Benita Stabile, MD  Cardiologist - Cresenciano Lick, MD in [place], [phone]     Sees yearly  Chest x-ray - 08-25-2018  2v  Epic EKG -  (08-28-2021 Epic)  will repeat Stress Test -  ECHO -07-21-2020  scanned to Epic, done in [place]    [facility] Cardiac Cath -   PCR screen: []  Ordered & Completed []   No Order but Needs PROFEND     [x]   N/A for this surgery  Surgery Plan:  [x]  Ambulatory   []  Outpatient in bed  []  Admit Anesthesia:    [x]  General  []  Spinal  []   Choice []   MAC  Bowel Prep - [x]  No  []   Yes ______  Pacemaker / ICD device [x]  No []  Yes   Spinal Cord Stimulator:[x]  No []  Yes       History of Sleep Apnea? [x]  No []  Yes   CPAP used?- [x]  No []  Yes    Does the patient monitor blood sugar?   [x]  N/A   []  No []  Yes  Patient has: [x]  NO Hx DM   []  Pre-DM   []  DM1  []   DM2  Blood Thinner / Instructions:Eliquis   Hold? Aspirin Instructions:  None  ERAS Protocol Ordered: [x]  No  []  Yes Patient is to be NPO after: Midnight Prior  Dental hx: []  Dentures:  []  N/A      []  Bridge or Partial:                   []  Loose or Damaged teeth:   Comments:   Activity level: Patient is able / unable to climb a flight of stairs without difficulty; []  No CP  []  No SOB, but would have ___   Patient can / can not perform ADLs without assistance.   Anesthesia review: A.fib (Cardioversions x2), s/p bioprosthetic AVR/ Root replacement in 2009, HTn, GERD, Hx CVA, s/p multiple back surgeries  Patient denies shortness of breath, fever, cough and chest pain at PAT appointment.  Patient verbalized understanding and agreement to the Pre-Surgical Instructions that were given to them at this PAT appointment. Patient was also educated of the need to review these PAT instructions again prior to her surgery.I reviewed the appropriate phone numbers to call if they have any and questions or concerns.

## 2023-04-26 NOTE — Patient Instructions (Addendum)
SURGICAL WAITING ROOM VISITATION Patients having surgery or a procedure may have no more than 2 support people in the waiting area - these visitors may rotate in the visitor waiting room.   Due to an increase in RSV and influenza rates and associated hospitalizations, children ages 65 and under may not visit patients in [facility] hospitals. If the patient needs to stay at the hospital during part of their recovery, the visitor guidelines for inpatient rooms apply.  PRE-OP VISITATION  Pre-op nurse will coordinate an appropriate time for 1 support person to accompany the patient in pre-op.  This support person may not rotate.  This visitor will be contacted when the time is appropriate for the visitor to come back in the pre-op area.  Please refer to the [facility] website for the visitor guidelines for Inpatients (after your surgery is over and you are in a regular room).  You are not required to quarantine at this time prior to your surgery. However, you must do this: Hand Hygiene often Do NOT share personal items Notify your provider if you are in close contact with someone who has COVID or you develop fever 100.4 or greater, new onset of sneezing, cough, sore throat, shortness of breath or body aches.  If you test positive for Covid or have been in contact with anyone that has tested positive in the last 10 days please notify you surgeon.    Your procedure is scheduled on:  Tuesday  May 10, 2023  Report to [facility] Main Entrance: [place] entrance where the [employer] is available.   Report to admitting at: 05:15    AM  Call this number if you have any questions or problems the morning of surgery [phone]  DO NOT EAT OR DRINK ANYTHING AFTER MIDNIGHT THE NIGHT PRIOR TO YOUR SURGERY / PROCEDURE.   FOLLOW  ANY ADDITIONAL PRE OP INSTRUCTIONS YOU RECEIVED FROM YOUR SURGEON'S OFFICE!!!   Oral Hygiene is also important to reduce your risk of infection.         Remember - BRUSH YOUR TEETH THE MORNING OF SURGERY WITH YOUR REGULAR TOOTHPASTE  Do NOT smoke after Midnight the night before surgery.   ELIQUIS- Stop taking ?????  STOP TAKING all Vitamins, Herbs and supplements 1 week before your surgery.   Take ONLY these medicines the morning of surgery with A SIP OF WATER: Levothyroxine, and you may take Tylenol if needed.                      You may not have any metal on your body including  jewelry, and body piercing  Do not wear  lotions, powders, cologne, or deodorant  Men may shave face and neck.  Contacts, Hearing Aids, dentures or bridgework may not be worn into surgery. DENTURES WILL BE REMOVED PRIOR TO SURGERY PLEASE DO NOT APPLY "Poly grip" OR ADHESIVES!!!  Patients discharged on the day of surgery will not be allowed to drive home.  Someone NEEDS to stay with you for the first 24 hours after anesthesia.  Do not bring your home medications to the hospital. The Pharmacy will dispense medications listed on your medication list to you during your admission in the Hospital.  Special Instructions: Bring a copy of your healthcare power of attorney and living will documents the day of surgery, if you wish to have them scanned into your [facility]- EPIC  Please read over the following fact sheets you were given:  IF YOU HAVE QUESTIONS ABOUT YOUR PRE-OP INSTRUCTIONS, PLEASE CALL [phone].   [place] - Preparing for Surgery Before surgery, you can play an important role.  Because skin is not sterile, your skin needs to be as free of germs as possible.  You can reduce the number of germs on your skin by washing with CHG (chlorahexidine gluconate) soap before surgery.  CHG is an antiseptic cleaner which kills germs and bonds with the skin to continue killing germs even after washing. Please DO NOT use if you have an allergy to CHG or antibacterial soaps.  If your skin becomes reddened/irritated stop using the CHG and inform  your nurse when you arrive at Short Stay. Do not shave (including legs and underarms) for at least 48 hours prior to the first CHG shower.  You may shave your face/neck.  Please follow these instructions carefully:  1.  Shower with CHG Soap the night before surgery and the  morning of surgery.  2.  If you choose to wash your hair, wash your hair first as usual with your normal  shampoo.  3.  After you shampoo, rinse your hair and body thoroughly to remove the shampoo.                             4.  Use CHG as you would any other liquid soap.  You can apply chg directly to the skin and wash.  Gently with a scrungie or clean washcloth.  5.  Apply the CHG Soap to your body ONLY FROM THE NECK DOWN.   Do not use on face/ open                           Wound or open sores. Avoid contact with eyes, ears mouth and genitals (private parts).                       Wash face,  Genitals (private parts) with your normal soap.             6.  Wash thoroughly, paying special attention to the area where your  surgery  will be performed.  7.  Thoroughly rinse your body with warm water from the neck down.  8.  DO NOT shower/wash with your normal soap after using and rinsing off the CHG Soap.            9.  Pat yourself dry with a clean towel.            10.  Wear clean pajamas.            11.  Place clean sheets on your bed the night of your first shower and do not  sleep with pets.  ON THE DAY OF SURGERY : Do not apply any lotions/deodorants the morning of surgery.  Please wear clean clothes to the hospital/surgery center.    FAILURE TO FOLLOW THESE INSTRUCTIONS MAY RESULT IN THE CANCELLATION OF YOUR SURGERY  PATIENT SIGNATURE_________________________________  NURSE SIGNATURE__________________________________  ________________________________________________________________________

## 2023-04-27 ENCOUNTER — Encounter (HOSPITAL_COMMUNITY)
Admission: RE | Admit: 2023-04-27 | Discharge: 2023-04-27 | Disposition: A | Discharge status: Home / Self Care | Payer: 59 | Source: Ambulatory Visit | Attending: Urology | Admitting: Urology

## 2023-04-27 ENCOUNTER — Encounter (HOSPITAL_COMMUNITY): Payer: Self-pay

## 2023-04-27 ENCOUNTER — Other Ambulatory Visit: Payer: Self-pay

## 2023-04-27 VITALS — BP 130/82 | HR 60 | Temp 97.7°F | Resp 16 | Ht 71.0 in | Wt 188.0 lb

## 2023-04-27 DIAGNOSIS — I10 Essential (primary) hypertension: Secondary | ICD-10-CM | POA: Diagnosis not present

## 2023-04-27 DIAGNOSIS — Z01818 Encounter for other preprocedural examination: Secondary | ICD-10-CM | POA: Insufficient documentation

## 2023-04-27 DIAGNOSIS — R9431 Abnormal electrocardiogram [ECG] [EKG]: Secondary | ICD-10-CM | POA: Insufficient documentation

## 2023-04-27 DIAGNOSIS — Z952 Presence of prosthetic heart valve: Secondary | ICD-10-CM | POA: Insufficient documentation

## 2023-04-27 DIAGNOSIS — Z01812 Encounter for preprocedural laboratory examination: Secondary | ICD-10-CM | POA: Diagnosis present

## 2023-04-27 DIAGNOSIS — Z01810 Encounter for preprocedural cardiovascular examination: Secondary | ICD-10-CM | POA: Diagnosis present

## 2023-04-27 HISTORY — DX: Essential (primary) hypertension: I10

## 2023-04-27 LAB — CBC
HCT: 41.1 % (ref 39.0–52.0)
Hemoglobin: 14 g/dL (ref 13.0–17.0)
MCH: 32.2 pg (ref 26.0–34.0)
MCHC: 34.1 g/dL (ref 30.0–36.0)
MCV: 94.5 fL (ref 80.0–100.0)
Platelets: 150 10*3/uL (ref 150–400)
RBC: 4.35 MIL/uL (ref 4.22–5.81)
RDW: 13.2 % (ref 11.5–15.5)
WBC: 5.8 10*3/uL (ref 4.0–10.5)
nRBC: 0 % (ref 0.0–0.2)

## 2023-04-27 LAB — BASIC METABOLIC PANEL
Anion gap: 9 (ref 5–15)
BUN: 29 mg/dL — ABNORMAL HIGH (ref 8–23)
CO2: 25 mmol/L (ref 22–32)
Calcium: 8.9 mg/dL (ref 8.9–10.3)
Chloride: 107 mmol/L (ref 98–111)
Creatinine, Ser: 1.21 mg/dL (ref 0.61–1.24)
GFR, Estimated: 59 mL/min — ABNORMAL LOW (ref >60)
Glucose, Bld: 106 mg/dL — ABNORMAL HIGH (ref 70–99)
Potassium: 4.4 mmol/L (ref 3.5–5.1)
Sodium: 141 mmol/L (ref 135–145)

## 2023-04-29 NOTE — Plan of Care (Signed)
[facility] Tonsillectomy/Adenoidectomy, Postoperative PEDS care plan entered in error.

## 2023-05-02 NOTE — Progress Notes (Signed)
Anesthesia Chart Review   Case: [id number] Date/Time: 05/10/23 0715   Procedure: CYSTOSCOPY LEFT RETROGRADE LEFT URETEROSCOPY/HOLMIUM LASER/STENT PLACEMENT (Left) - 1 HR FOR CASE   Anesthesia type: General   Pre-op diagnosis: LEFT RENAL STONES AND HEMATURIA   Location: [facility]   Surgeons: Bjorn Pippin, MD       DISCUSSION:82 y.o. never smoker with h/o HTN, s/p AVR 2009, atrial fibrillation, stroke, hypothyroidism, left renal stones and hematuria scheduled for above procedure 05/10/2023 with Dr. Bjorn Pippin.   Pt follows with cardiologist in [place] yearly.  Unable to view recent notes.  Will request cardiac clearance.  VS: BP 130/82 Comment: right arm sitting  Pulse 60   Temp 36.5 C (Oral)   Resp 16   Ht 5\' 11"  (1.803 m)   Wt 85.3 kg   SpO2 100%   BMI 26.22 kg/m   PROVIDERS: Benita Stabile, MD is PCP    LABS: Labs reviewed: Acceptable for surgery. (all labs ordered are listed, but only abnormal results are displayed)  Labs Reviewed  BASIC METABOLIC PANEL - Abnormal; Notable for the following components:      Result Value   Glucose, Bld 106 (*)    BUN 29 (*)    GFR, Estimated 59 (*)    All other components within normal limits  CBC     IMAGES:   EKG:   CV: Echo 07/21/2020 1. Left ventricular ejection fraction, by estimation, is 60 to 65%. The  left ventricle has normal function. The left ventricle has no regional  wall motion abnormalities. There is mild concentric left ventricular  hypertrophy. Left ventricular diastolic  parameters are indeterminate.   2. Right ventricular systolic function is moderately reduced. The right  ventricular size is normal.   3. Left atrial size was severely dilated.   4. The mitral valve is normal in structure. Trivial mitral valve  regurgitation.   5. The aortic valve has been repaired/replaced. Aortic valve  regurgitation is not visualized. No aortic stenosis is present. There is a  27 mm Magna valve present in  the aortic position. Procedure Date: 2009.   6. Aortic dilatation noted. There is mild dilatation of the aortic root,  measuring 43 mm.   7. The inferior vena cava is normal in size with greater than 50%  respiratory variability, suggesting right atrial pressure of 3 mmHg.  Past Medical History:  Diagnosis Date   Arthritis    Atrial fibrillation [facility])    Cardioversion 2016   Atrial flutter [facility])    Cardioversion 2013   Blood transfusion without reported diagnosis    Cancer (HCC)    Skin- Basil/Squamous cell   Dysrhythmia    A.fib, cardioversions x2   GERD (gastroesophageal reflux disease)    History of aortic valve replacement with bioprosthetic valve    Aortic regurgitation   History of chicken pox    History of kidney stones    History of skin cancer    History of stroke 2011   Hypertension    Hypothyroidism    Kidney stone    Seasonal allergies    Stroke [facility])    after heart surgery 2010    Past Surgical History:  Procedure Laterality Date   AORTIC VALVE REPLACEMENT  2009   Bioprosthetic AVR and root replacement   BACK SURGERY     total of 4 back surgeries   BILATERAL CARPAL TUNNEL RELEASE Bilateral    2022,2023   CATARACT EXTRACTION, BILATERAL  COLONOSCOPY     CYSTOSCOPY WITH RETROGRADE PYELOGRAM, URETEROSCOPY AND STENT PLACEMENT Bilateral 10/01/2019   Procedure: CYSTOSCOPY WITH RETROGRADE PYELOGRAM, URETEROSCOPY AND STENT PLACEMENT;  Surgeon: Bjorn Pippin, MD;  [facility];  Service: Urology;  Laterality: Bilateral;   EPIGASTRIC HERNIA REPAIR     HERNIA REPAIR     LUMBAR LAMINECTOMY/DECOMPRESSION MICRODISCECTOMY Right 06/01/2017   Procedure: EXTRAFORAMINAL MICRODISCECTOMY LUMBAR FIVE- SACRAL ONE, RIGHT;  Surgeon: Tia Alert, MD;  Location: [facility] OR;  Service: Neurosurgery;  Laterality: Right;   LUMBAR LAMINECTOMY/DECOMPRESSION MICRODISCECTOMY Bilateral 09/04/2018   Procedure: Laminectomy and Foraminotomy - Lumbar two-three bilateral;  Surgeon: Tia Alert, MD;  Location: [facility] OR;  Service: Neurosurgery;  Laterality: Bilateral;   REVERSE SHOULDER ARTHROPLASTY Left 09/01/2021   Procedure: LEFT REVERSE SHOULDER ARTHROPLASTY;  Surgeon: Cammy Copa, MD;  Location: [facility] OR;  Service: Orthopedics;  Laterality: Left;   TOTAL HIP ARTHROPLASTY Left 12/19/2020   Procedure: LEFT TOTAL HIP ARTHROPLASTY ANTERIOR APPROACH;  Surgeon: Kathryne Hitch, MD;  [facility];  Service: Orthopedics;  Laterality: Left;   TOTAL KNEE ARTHROPLASTY Left 12/31/2021   Procedure: TOTAL KNEE ARTHROPLASTY;  Surgeon: Cammy Copa, MD;  Location: [facility] OR;  Service: Orthopedics;  Laterality: Left;   TRANSFORAMINAL LUMBAR INTERBODY FUSION (TLIF) WITH PEDICLE SCREW FIXATION 1 LEVEL Left 06/15/2019   Procedure: Decompression and Instrumention Fusion Lumbar Four- Five;  Surgeon: Tia Alert, MD;  Location: [facility] OR;  Service: Neurosurgery;  Laterality: Left;  Decompression and Instrumention Fusion Lumbar Four- Five    MEDICATIONS:  acetaminophen (TYLENOL) 500 MG tablet   apixaban (ELIQUIS) 5 MG TABS tablet   apixaban (ELIQUIS) 5 MG TABS tablet   diphenhydrAMINE (BENADRYL) 25 MG tablet   docusate sodium (COLACE) 100 MG capsule   finasteride (PROSCAR) 5 MG tablet   fluorouracil (EFUDEX) 5 % cream   levothyroxine (EUTHYROX) 50 MCG tablet   levothyroxine (EUTHYROX) 75 MCG tablet   levothyroxine (SYNTHROID) 75 MCG tablet   lisinopril-hydrochlorothiazide (ZESTORETIC) 10-12.5 MG tablet   Multiple Vitamin (MULTIVITAMIN WITH MINERALS) TABS tablet   Omega-3 Fatty Acids (FISH OIL PO)   tamsulosin (FLOMAX) 0.4 MG CAPS capsule   tamsulosin (FLOMAX) 0.4 MG CAPS capsule   tamsulosin (FLOMAX) 0.4 MG CAPS capsule   No current facility-administered medications for this encounter.    Jodell Cipro Ward, PA-C [facility] [phone]

## 2023-05-03 ENCOUNTER — Ambulatory Visit: Payer: 59 | Attending: Internal Medicine | Admitting: Internal Medicine

## 2023-05-03 ENCOUNTER — Telehealth: Payer: Self-pay | Admitting: Internal Medicine

## 2023-05-03 ENCOUNTER — Encounter: Payer: Self-pay | Admitting: Internal Medicine

## 2023-05-03 VITALS — BP 142/86 | HR 65 | Ht 71.0 in | Wt 193.4 lb

## 2023-05-03 DIAGNOSIS — Z952 Presence of prosthetic heart valve: Secondary | ICD-10-CM

## 2023-05-03 DIAGNOSIS — I4821 Permanent atrial fibrillation: Secondary | ICD-10-CM

## 2023-05-03 DIAGNOSIS — I4892 Unspecified atrial flutter: Secondary | ICD-10-CM | POA: Diagnosis not present

## 2023-05-03 DIAGNOSIS — Z7901 Long term (current) use of anticoagulants: Secondary | ICD-10-CM

## 2023-05-03 DIAGNOSIS — Z8673 Personal history of transient ischemic attack (TIA), and cerebral infarction without residual deficits: Secondary | ICD-10-CM | POA: Diagnosis not present

## 2023-05-03 NOTE — Progress Notes (Signed)
Cardiology Office Note  Date: 05/03/2023   ID: Keith Scrape., DOB [birth year], MRN [id number]  PCP:  Benita Stabile, MD  Cardiologist:  None Electrophysiologist:  None   History of Present Illness: Keith Boleyn. is a 82 y.o. male known to have history of aortic regurgitation s/p bioprosthetic aortic valve and aortic root replacement in 2009 in [place], permanent atrial fibrillation, paroxysmal atrial flutter s/p DCCV in 2013, history of CVA after heart surgery was referred to cardiology clinic for preop cardiac risk stratification.  METs more than 4, no DOE or angina.  I reviewed the EKG on the EMR that showed atrial fibrillation with slow ventricular response with HR 48 bpm.  He denies having any symptoms of lightheadedness, dizziness, severe fatigue, syncope or SOB.  Prior cardiology notes were reviewed, he was previously on flecainide and rate controlling agents which were discontinued later due to bradycardia.  Past Medical History:  Diagnosis Date   Arthritis    Atrial fibrillation [facility])    Cardioversion 2016   Atrial flutter [facility])    Cardioversion 2013   Blood transfusion without reported diagnosis    Cancer (HCC)    Skin- Basil/Squamous cell   Dysrhythmia    A.fib, cardioversions x2   GERD (gastroesophageal reflux disease)    History of aortic valve replacement with bioprosthetic valve    Aortic regurgitation   History of chicken pox    History of kidney stones    History of skin cancer    History of stroke 2011   Hypertension    Hypothyroidism    Kidney stone    Seasonal allergies    Stroke [facility])    after heart surgery 2010    Past Surgical History:  Procedure Laterality Date   AORTIC VALVE REPLACEMENT  2009   Bioprosthetic AVR and root replacement   BACK SURGERY     total of 4 back surgeries   BILATERAL CARPAL TUNNEL RELEASE Bilateral    2022,2023   CATARACT EXTRACTION, BILATERAL     COLONOSCOPY     CYSTOSCOPY WITH RETROGRADE PYELOGRAM,  URETEROSCOPY AND STENT PLACEMENT Bilateral 10/01/2019   Procedure: CYSTOSCOPY WITH RETROGRADE PYELOGRAM, URETEROSCOPY AND STENT PLACEMENT;  Surgeon: Bjorn Pippin, MD;  [facility];  Service: Urology;  Laterality: Bilateral;   EPIGASTRIC HERNIA REPAIR     HERNIA REPAIR     LUMBAR LAMINECTOMY/DECOMPRESSION MICRODISCECTOMY Right 06/01/2017   Procedure: EXTRAFORAMINAL MICRODISCECTOMY LUMBAR FIVE- SACRAL ONE, RIGHT;  Surgeon: Tia Alert, MD;  Location: [facility] OR;  Service: Neurosurgery;  Laterality: Right;   LUMBAR LAMINECTOMY/DECOMPRESSION MICRODISCECTOMY Bilateral 09/04/2018   Procedure: Laminectomy and Foraminotomy - Lumbar two-three bilateral;  Surgeon: Tia Alert, MD;  Location: [facility] OR;  Service: Neurosurgery;  Laterality: Bilateral;   REVERSE SHOULDER ARTHROPLASTY Left 09/01/2021   Procedure: LEFT REVERSE SHOULDER ARTHROPLASTY;  Surgeon: Cammy Copa, MD;  Location: [facility] OR;  Service: Orthopedics;  Laterality: Left;   TOTAL HIP ARTHROPLASTY Left 12/19/2020   Procedure: LEFT TOTAL HIP ARTHROPLASTY ANTERIOR APPROACH;  Surgeon: Kathryne Hitch, MD;  [facility];  Service: Orthopedics;  Laterality: Left;   TOTAL KNEE ARTHROPLASTY Left 12/31/2021   Procedure: TOTAL KNEE ARTHROPLASTY;  Surgeon: Cammy Copa, MD;  Location: [facility] OR;  Service: Orthopedics;  Laterality: Left;   TRANSFORAMINAL LUMBAR INTERBODY FUSION (TLIF) WITH PEDICLE SCREW FIXATION 1 LEVEL Left 06/15/2019   Procedure: Decompression and Instrumention Fusion Lumbar Four- Five;  Surgeon: Tia Alert, MD;  Location: [facility] OR;  Service:  Neurosurgery;  Laterality: Left;  Decompression and Instrumention Fusion Lumbar Four- Five    Current Outpatient Medications  Medication Sig Dispense Refill   acetaminophen (TYLENOL) 500 MG tablet Take 1,000 mg by mouth every 6 (six) hours as needed for moderate pain.     apixaban (ELIQUIS) 5 MG TABS tablet Take 1 tablet (5 mg total) by mouth 2 (two) times daily. 180 tablet 2    diphenhydrAMINE (BENADRYL) 25 MG tablet Take 25 mg by mouth daily as needed for allergies.     finasteride (PROSCAR) 5 MG tablet Take 1 tablet (5 mg total) by mouth daily. (Patient taking differently: Take 5 mg by mouth at bedtime.) 90 tablet 3   fluorouracil (EFUDEX) 5 % cream Apply 1 Application topically daily as needed. 40 g PRN   levothyroxine (EUTHYROX) 75 MCG tablet Take 1 tablet (75 mcg total) by mouth daily. 30 tablet 2   lisinopril-hydrochlorothiazide (ZESTORETIC) 10-12.5 MG tablet Take 1 tablet by mouth daily 90 tablet 2   Multiple Vitamin (MULTIVITAMIN WITH MINERALS) TABS tablet Take 1 tablet by mouth daily.     Omega-3 Fatty Acids (FISH OIL PO) Take 330 mg by mouth daily.     tamsulosin (FLOMAX) 0.4 MG CAPS capsule Take 1 capsule (0.4 mg total) by mouth daily. (Patient taking differently: Take 0.4 mg by mouth at bedtime.) 100 capsule 3   No current facility-administered medications for this visit.   Allergies:  Tape   Social History: The patient  reports that he has never smoked. He has never used smokeless tobacco. He reports current alcohol use. He reports that he does not use drugs.   Family History: The patient's family history is not on file.   ROS:  Please see the history of present illness. Otherwise, complete review of systems is positive for none  All other systems are reviewed and negative.   Physical Exam: VS:  BP (!) 142/86   Pulse 65   Ht 5\' 11"  (1.803 m)   Wt 193 lb 6.4 oz (87.7 kg)   SpO2 98%   BMI 26.97 kg/m , BMI Body mass index is 26.97 kg/m.  Wt Readings from Last 3 Encounters:  05/03/23 193 lb 6.4 oz (87.7 kg)  04/27/23 188 lb (85.3 kg)  12/31/21 179 lb (81.2 kg)    General: Patient appears comfortable at rest. HEENT: Conjunctiva and lids normal, oropharynx clear with moist mucosa. Neck: Supple, no elevated JVP or carotid bruits, no thyromegaly. Lungs: Clear to auscultation, nonlabored breathing at rest. Cardiac: Regular rate and rhythm, no S3  or significant systolic murmur, no pericardial rub. Abdomen: Soft, nontender, no hepatomegaly, bowel sounds present, no guarding or rebound. Extremities: No pitting edema, distal pulses 2+. Skin: Warm and dry. Musculoskeletal: No kyphosis. Neuropsychiatric: Alert and oriented x3, affect grossly appropriate.  Recent Labwork: 04/27/2023: BUN 29; Creatinine, Ser 1.21; Hemoglobin 14.0; Platelets 150; Potassium 4.4; Sodium 141     Component Value Date/Time   CHOL 204 (H) 08/14/2019 1028   TRIG 230.0 (H) 08/14/2019 1028   HDL 50.30 08/14/2019 1028   CHOLHDL 4 08/14/2019 1028   VLDL 46.0 (H) 08/14/2019 1028   LDLDIRECT 119.0 08/14/2019 1028     Assessment and Plan:  Preop cardiac risk stratification for cystoscopy and stent placement Severe aortic regurgitation s/p bioprosthetic AVR with root replacement in 2009 (27 Edwards magna valve) Permanent atrial fibrillation Paroxysmal atrial flutter s/p DCCV in 2013 in [place] (Previously was on flecainide, metoprolol, had to be DC'd due to low HR) History of  postop CVA (self-reported, per patient)   -EKG from 04/27/2023 showed atrial fibrillation with slow ventricular response, HR 48 bpm. Denies having any dizziness, lightheadedness, syncope, severe fatigue. Not on AV nodal agents.  He does have atrial fibrillation with conduction system disease but due to absence of symptoms, PPM is not warranted at this time.  Echocardiogram from 2022 showed normal LVEF, severely dilated LA with trivial MR, aortic root dilatation 43 mm, there was a slight increase in the prosthetic aortic valve acceleration time (126 ms) without change in gradients suggestive of obstruction but there was no evidence of aortic valve stenosis or aortic regurgitation. No murmur on physical exam. Will repeat echocardiogram but this can be performed after the surgery. -Due to METs more than 4, absence of symptoms of DOE, angina, he is at a low risk for any perioperative cardiac  complications. No further cardiac workup is indicated prior to proceeding with the planned procedure.  Eliquis can be held for 2 to 3 days prior to the procedure and can be resumed when safe from bleeding standpoint. He did have a prior history of CVA but this was in the postop setting and when he was in the normal sinus rhythm, per patient.  He also had multiple prior surgeries previously where he had to hold Eliquis with no Lovenox bridging and did not have a CVA event.  Hence it is okay to hold Eliquis with no Lovenox bridging at this time.   I have spent a total duration of 45 minutes reviewing prior records/notes, EKG, labs, echocardiogram, face-to-face discussion/counseling of his medical condition, pathophysiology, evaluation, management, ordering imaging studies and documenting the findings in the note.    Disposition:  Follow up  1 year  Signed Steward Sames Verne Spurr, MD, 05/03/2023 4:02 PM    [facility] [address] [place], [place], [place] [address]

## 2023-05-03 NOTE — Progress Notes (Signed)
CLEARANCE NOTES HAVE BEEN FAXED TO DR. Annabell Howells

## 2023-05-03 NOTE — Telephone Encounter (Signed)
Pt has appt 05/03/23 with Dr. Jenene Slicker 05/03/23. I will update all parties involved.

## 2023-05-03 NOTE — Telephone Encounter (Signed)
Pre-operative Risk Assessment    Patient Name: Keith [name].  DOB: December 18, 1940 MRN: [id number]{      Request for Surgical Clearance    Procedure:   CYSTOCOPY LEFT URETEROSCOPY WITH LASER STENTS  Date of Surgery:  Clearance 05/10/23                             \    Surgeon:  DR. Annabell Howells Surgeon's Group or Practice Name:  [facility]  Phone number:  [phone] Fax number:  [phone]   Type of Clearance Requested:   - Medical    Type of Anesthesia:  Not Indicated   Additional requests/questions:  Please fax a copy of clearance to the surgeon's office.  Signed, Vallarie Mare   05/03/2023, 11:16 AM

## 2023-05-03 NOTE — Telephone Encounter (Signed)
    Primary Cardiologist:None  Chart reviewed as part of pre-operative protocol coverage. Because of Keith SABINE Jr.'s past medical history and time since last visit, he/she will require a follow-up visit in order to better assess preoperative cardiovascular risk.  Pre-op covering staff: - Please schedule  in office appointment and call patient to inform them. - Please contact requesting surgeon's office via preferred method (i.e, phone, fax) to inform them of need for appointment prior to surgery.  If applicable, this message will also be routed to pharmacy pool and/or primary cardiologist for input on holding anticoagulant/antiplatelet agent as requested below so that this information is available at time of patient's appointment.   Ronney Asters, NP  05/03/2023, 11:35 AM

## 2023-05-03 NOTE — Patient Instructions (Addendum)
Medication Instructions:  Your physician recommends that you continue on your current medications as directed. Please refer to the Current Medication list given to you today.   Labwork: None  Testing/Procedures: Your physician has requested that you have an echocardiogram. Echocardiography is a painless test that uses sound waves to create images of your heart. It provides your doctor with information about the size and shape of your heart and how well your heart's chambers and valves are working. This procedure takes approximately one hour. There are no restrictions for this procedure. Please do NOT wear cologne, perfume, aftershave, or lotions (deodorant is allowed). Please arrive 15 minutes prior to your appointment time.   Follow-Up: Your physician recommends that you schedule a follow-up appointment in: 1 year. You will receive a reminder call in about 8 months reminding you to schedule your appointment. If you don't receive this call, please contact our office.   Any Other Special Instructions Will Be Listed Below (If Applicable).  Thank you for choosing [facility]!      If you need a refill on your cardiac medications before your next appointment, please call your pharmacy.

## 2023-05-05 NOTE — Anesthesia Preprocedure Evaluation (Addendum)
Anesthesia Evaluation  Patient identified by MRN, date of birth, ID band Patient awake    Reviewed: Allergy & Precautions, NPO status , Patient's Chart, lab work & pertinent test results  History of Anesthesia Complications Negative for: history of anesthetic complications  Airway Mallampati: II  TM Distance: >3 FB Neck ROM: Full    Dental  (+) Dental Advisory Given, Chipped   Pulmonary neg pulmonary ROS   Pulmonary exam normal breath sounds clear to auscultation       Cardiovascular hypertension, Pt. on medications (-) angina +CHF  (-) CAD + dysrhythmias Atrial Fibrillation + Valvular Problems/Murmurs (s/p AVR)  Rhythm:Irregular Rate:Normal + Systolic murmurs 2/95 ECHO:   1. Left ventricular ejection fraction, by estimation, is 60 to 65%. The left ventricle has normal function. The left ventricle has no regional wall motion abnormalities. There is mild concentric left ventricular hypertrophy. Left ventricular diastolic parameters are indeterminate.   2. Right ventricular systolic function is moderately reduced. The right ventricular size is normal.   3. Left atrial size was severely dilated.   4. The mitral valve is normal in structure. Trivial mitral valve regurgitation.   5. The aortic valve has been repaired/replaced. Aortic valve regurgitation is not visualized. No aortic stenosis is present. There is a 27 mm Magna valve present in the aortic position. Procedure Date: 2009.   6. Aortic dilatation noted. There is mild dilatation of the aortic root, measuring 43mm.   7. The inferior vena cava is normal in size with greater than 50% respiratory variability, suggesting right atrial pressure of 3 mmHg     Neuro/Psych Back pain CVA, No Residual Symptoms  negative psych ROS   GI/Hepatic Neg liver ROS,GERD  Medicated and Controlled,,  Endo/Other  Hypothyroidism    Renal/GU Renal diseasestones     Musculoskeletal  (+)  Arthritis , Osteoarthritis,    Abdominal   Peds  Hematology Eliquis: last dose 2 weeks ago   Anesthesia Other Findings   Reproductive/Obstetrics                              Anesthesia Physical Anesthesia Plan  ASA: 3  Anesthesia Plan: General   Post-op Pain Management: Tylenol PO (pre-op)*   Induction:   PONV Risk Score and Plan: 3 and Ondansetron, Dexamethasone and Treatment may vary due to age or medical condition  Airway Management Planned: LMA  Additional Equipment: None  Intra-op Plan:   Post-operative Plan: Extubation in OR  Informed Consent: I have reviewed the patients History and Physical, chart, labs and discussed the procedure including the risks, benefits and alternatives for the proposed anesthesia with the patient or authorized representative who has indicated his/her understanding and acceptance.     Dental advisory given  Plan Discussed with: CRNA  Anesthesia Plan Comments: (See PAT note 04/27/2023)         Anesthesia Quick Evaluation

## 2023-05-09 NOTE — H&P (Signed)
My PSA is elevated above the normal range.  HPI: Keith [name] is a 82 year-old male established patient who is here for an elevated PSA.    04/20/23: Keith [name] returns today in f/u. He was started on finasteride in July for recurrent hematuria with BPH and BOO. He continues to have intermittent gross hematuria that can be worse with activity. He has rare RBC's in the UA today. He has 2 left renal stones that didn't seem to be in a position to cause bleeding. He has some thin clots. His PSA is down to 3.79 on the finasteride. His IPSS is 6 with nocturia x 3.   01/21/23: Keith [name] returns today in f/u for cystoscopy to complete his hematuria w/u. The CT showed a LLP stone but it doesn't appear to be in a position to cause bleeding, He had a prostate middle lobe but the visualization of the base of the bladder was obscured by beam hardening. UA has RBC's today and he has continued to see intermittent hematuria after physical activity.   12/20/22: Keith [name] returns today in f/u. His PSA is down to 5.78. He has stable LUTS with an IPSS of 8. He remains on tamsulosin bid. He continues to have some hematuria every 2-3 days. He has stones and is on Eliquis. He passes clots after he mows his grass. He has had no flank pain. He had ureteroscopy in 2021. He had a KUB last year that showed small bilateral lower pole stones. His testosterone level is minimally changed at .   08/18/22: Keith [name] returns today in f/u for his elevated PSA. His PSA was down to 5.17 in 11/23 but is back up to 6.72 on 08/11/21. His testosterone level was only 173 in 11/23. He has mild/mod LUTS with an IPSS of 9. He has nocturia x 2. His UA is clear today. He passed a small stone a couple of weeks ago with mild hematuria at that time.   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 His prior PSA was 3.16 in 2021. He has a history of stones but has no flank pain or gross hematuria.  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 He has mild LUTS with an IPSS of 7 and nocturia x 2 with some urgency. He has not had COVID recently. He was on bactrim from his dermatologist. He is on tamsulosin bid for BPH with BOO. He is on Eliquis. He had ureteroscopy in 2021. 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     AUA Symptom Score: Less than 20% of the time he has the sensation of not emptying his bladder completely when finished urinating. Less than 20% of the time he has to urinate again fewer than two hours after he has finished urinating. Less than 20% of the time he has to start and stop again several times when he urinates. Less than 20% of the time he finds it difficult to postpone urination. Less than 50% of the time he has a weak urinary stream. Less than 20% of the time he has to push or strain to begin urination. He has to get up to urinate 1 time from the time he goes to bed until the time he gets up in the morning.   Calculated AUA Symptom Score: 8    ALLERGIES: No Known Drug Allergies    MEDICATIONS: Finasteride 5  mg tablet 1 tablet PO Daily  Levothyroxine Sodium  Metoprolol Tartrate  Tamsulosin Hcl 0.4 mg capsule 1 capsule PO Daily  Eliquis  Ibuprofen  Oxycodone Hcl  Tylenol     GU PSH: Cysto Remove Stent FB Sim - 2021 Cystoscopy - 01/21/2023 ESWL Locm 300-399Mg /Ml Iodine,1Ml - 01/07/2023 Ureteroscopic laser litho, Bilateral - 2021       PSH Notes: Lithotripsy     NON-GU PSH: Aortic valve replacement (tissue) - about 2011 Heart Surgery (Unspecified) Lumbar Laminectomy Visit Complexity (formerly GPC1X) - 12/20/2022, 08/18/2022     GU PMH: BPH w/LUTS - 01/21/2023, He is doing well on tamsulosin. , - 12/20/2022, I have refilled the tamsulosin for once daily use. , - 08/18/2022, He has mild LUTS and will stay on tamsulosin., - 05/18/2022, He is voiding better on tamsulosin bid and will continue that. He will return in a year. , -  Mar 07, 2022, He is going to change back to tamsulosin. , - 2022, - 2022, He has nocturia on the tamsulosin. I am going to have him try silodosin to see if that does a better job with the nocturia. , - 2021 Elevated PSA, PSA at f/u. - 01/21/2023, His PSA is back down some. I don't know that he needs further testing but will review that again at f/u. , - 12/20/2022, His PSA remains elevated but stable over the last 3 months. I discussed options with him and will just have him return in 4 months with another PSA. I did explain that I am concerned when I see a PSA elevation in the face of low testosterone and if there is any further increase in the PSA, I will recommend further evaluation with probable biopsy. , - 08/18/2022, His PSA is up to 6.8 from 3.16 2 years ago but his exam is benign. I am going to repeat a PSA today with reflex to free/total and if that is still up I will consider further evaluation possibly with an ExoDx and MRIP. If it is down, I will repeat in 3 months. I will also check a testosterone panel based on his history because if his T is quite low that makes a high grade prostate cancer more likely to be the cause of the PSA increase. , - 05/18/2022 Gross hematuria, He continues to have some bleeding that I think is probably from the prostate. There is a small chance it could be from the left renal stone and I will monitor that. I am going to start him on finasteride but did discuss TURP. I revewed the side effects and instructions for the med. He will f/u in 3 months with a PSA. - 01/21/2023, - 01/07/2023, He will return with the CT and I will consider a Cysto based on the results. , - 12/20/2022 Renal calculus - 01/21/2023, He has increased hematuria and bilateral renal stones. I will get a CT hematuria study to assess the stones and r/o other causes for the hematuria. , - 12/20/2022, He has stable bilateral renal stones. KUB in a year., - 03/07/22, He has passed several stones but the stones are stable  on KUB. I will do a metabolic w/u and if he continues to pass stones, I will need to do a CT. , - 2022, Overdue for f/u in regards to this. KUB could not be performed at this time so pt will need to rescheduled for f/u in the next 4-6 weeks for KUB and repeat MD exam., - 2022, - 2021 Weak Urinary Stream -  01/21/2023 Nocturia - 12/20/2022, - 08/18/2022, - 05/18/2022, - 03/01/2022, - 2022, - 2022, - 2021 Microscopic hematuria, He has stable hematuria. - 05/18/2022, He had cystoscopy last year and the hematuria is likely from the stones. , - 2022, - 2021 Urinary Retention - 01/14/2022 Buried penis (acquired) - 2022 Ureteral calculus - 2021, He has significant stent pain associated with his recent procedure. His stents were removed and his urine was cultured. he was given Cipro to cover the procedure and to continue 250mg  bid for 2 days. , - 2021, He has bilateral ureteral stones with a 13mm right proximal and 6mm left mid stone. I discussed ESWL and URS and will get him set up for bilateral URS today. I have reviewed the risks of ureteroscopy including bleeding, infection, ureteral injury, need for a stent or secondary procedures, thrombotic events and anesthetic complications. 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 - 12/20/2022, I will repeat the testosterone panel at f/u. , - 08/18/2022 GERD Skin Cancer, History    FAMILY HISTORY: No Family History    SOCIAL HISTORY: Marital Status: Married Preferred Language: English; Race: White Current Smoking Status: Patient has never smoked.   Tobacco Use Assessment Completed: Used Tobacco in last 30 days? Drinks 1 caffeinated drink per day.    REVIEW OF SYSTEMS:    GU Review Male:   Patient reports get up at night to urinate. Patient denies frequent urination, hard to postpone urination, burning/ pain with urination, leakage of urine, stream starts and stops, trouble starting your stream, have to strain to urinate , erection  problems, and penile pain.  Gastrointestinal (Upper):   Patient denies nausea, vomiting, and indigestion/ heartburn.  Gastrointestinal (Lower):   Patient denies diarrhea and constipation.  Constitutional:   Patient denies fever, night sweats, weight loss, and fatigue.  Skin:   Patient denies skin rash/ lesion and itching.  Eyes:   Patient denies blurred vision and double vision.  Ears/ Nose/ Throat:   Patient denies sore throat and sinus problems.  Hematologic/Lymphatic:   Patient denies swollen glands and easy bruising.  Cardiovascular:   Patient denies leg swelling and chest pains.  Respiratory:   Patient denies cough and shortness of breath.  Endocrine:   Patient denies excessive thirst.  Musculoskeletal:   Patient denies back pain and joint pain.  Neurological:   Patient denies headaches and dizziness.  Psychologic:   Patient denies depression and anxiety.   Notes: Weak stream    VITAL SIGNS: None   MULTI-SYSTEM PHYSICAL EXAMINATION:    Constitutional: Well-nourished. No physical deformities. Normally developed. Good grooming.   Respiratory: Normal breath sounds. No labored breathing, no use of accessory muscles.   Cardiovascular: Regular rate and rhythm. No murmur, no gallop.      Complexity of Data:  Lab Test Review:   PSA  Records Review:   AUA Symptom Score, Previous Patient Records  Urine Test Review:   Urinalysis   12/13/22 08/11/22 05/18/22 05/05/22 08/14/19  PSA  Total PSA 5.78 ng/mL 6.72 ng/mL 5.17 ng/mL 6.8 ng/ml 3.16 ng/ml  Free PSA 1.10 ng/mL 1.19 ng/mL 1.03 ng/mL    % Free PSA 19 % PSA 18 % PSA 20 % PSA      PROCEDURES:          Visit Complexity - G2211 Chronic management         Urinalysis w/Scope Dipstick Dipstick Cont'd Micro  Color: Yellow Bilirubin: Neg mg/dL WBC/hpf: NS (  Not Seen)  Appearance: Clear Ketones: Neg mg/dL RBC/hpf: 0 - 2/hpf  Specific Gravity: 1.020 Blood: Trace ery/uL Bacteria: NS (Not Seen)  pH: <=5.0 Protein: Neg mg/dL Cystals: NS  (Not Seen)  Glucose: Neg mg/dL Urobilinogen: 0.2 mg/dL Casts: NS (Not Seen)    Nitrites: Neg Trichomonas: Not Present    Leukocyte Esterase: Neg leu/uL Mucous: Present      Epithelial Cells: 0 - 5/hpf      Yeast: NS (Not Seen)      Sperm: Not Present    Notes: transitional epithelial cells noted          Urinalysis w/Scope Dipstick Dipstick Cont'd Micro  Color: Yellow Bilirubin: Neg mg/dL WBC/hpf: 0 - 5/hpf  Appearance: Clear Ketones: Neg mg/dL RBC/hpf: 0 - 2/hpf  Specific Gravity: 1.025 Blood: 1+ ery/uL Bacteria: NS (Not Seen)  pH: <=5.0 Protein: Neg mg/dL Cystals: NS (Not Seen)  Glucose: Neg mg/dL Urobilinogen: 0.2 mg/dL Casts: NS (Not Seen)    Nitrites: Neg Trichomonas: Not Present    Leukocyte Esterase: Trace leu/uL Mucous: Not Present      Epithelial Cells: 0 - 5/hpf      Yeast: NS (Not Seen)      Sperm: Not Present    ASSESSMENT:      ICD-10 Details  1 GU:   Elevated PSA - R97.20 Chronic, Stable, Improving - down appropriately on finasteride to 3.79.   2   BPH w/LUTS - N40.1 Chronic, Stable - He has mild LUTS and will continue current therapy.   3   Nocturia - R35.1 Chronic, Stable  4   Gross hematuria - R31.0 Chronic, Stable - He has some thin clots with the hematuria whch can be from the stones. I will get him set up for left ureteroscopy and will get him cleared to come off of Eliquis.   I have reviewed the risks of ureteroscopy including bleeding, infection, ureteral injury, need for a stent or secondary procedures, thrombotic events and anesthetic complications.    5   Renal calculus - N20.0 Chronic, Stable   PLAN:           Schedule Return Visit/Planned Activity: Next Available Appointment - Schedule Surgery  Procedure: Unspecified Date - Cysto Uretero Lithotripsy - [phone], left Notes: Next available.           Document Letter(s):  Created for Patient: Clinical Summary

## 2023-05-10 ENCOUNTER — Other Ambulatory Visit: Payer: Self-pay

## 2023-05-10 ENCOUNTER — Ambulatory Visit (HOSPITAL_COMMUNITY)
Admission: RE | Admit: 2023-05-10 | Discharge: 2023-05-10 | Disposition: A | Discharge status: Home / Self Care | Payer: 59 | Attending: Urology | Admitting: Urology

## 2023-05-10 ENCOUNTER — Encounter (HOSPITAL_COMMUNITY)
Admission: RE | Disposition: A | Discharge status: Home / Self Care | Payer: Self-pay | Source: Home / Self Care | Attending: Urology

## 2023-05-10 ENCOUNTER — Ambulatory Visit (HOSPITAL_COMMUNITY): Payer: 59

## 2023-05-10 ENCOUNTER — Ambulatory Visit (HOSPITAL_BASED_OUTPATIENT_CLINIC_OR_DEPARTMENT_OTHER): Payer: 59 | Admitting: Anesthesiology

## 2023-05-10 ENCOUNTER — Encounter (HOSPITAL_COMMUNITY): Payer: Self-pay | Admitting: Urology

## 2023-05-10 ENCOUNTER — Ambulatory Visit (HOSPITAL_COMMUNITY): Payer: 59 | Admitting: Physician Assistant

## 2023-05-10 ENCOUNTER — Other Ambulatory Visit (HOSPITAL_COMMUNITY): Payer: Self-pay

## 2023-05-10 DIAGNOSIS — N401 Enlarged prostate with lower urinary tract symptoms: Secondary | ICD-10-CM | POA: Diagnosis not present

## 2023-05-10 DIAGNOSIS — R351 Nocturia: Secondary | ICD-10-CM | POA: Insufficient documentation

## 2023-05-10 DIAGNOSIS — I4891 Unspecified atrial fibrillation: Secondary | ICD-10-CM | POA: Diagnosis not present

## 2023-05-10 DIAGNOSIS — N200 Calculus of kidney: Secondary | ICD-10-CM | POA: Diagnosis not present

## 2023-05-10 DIAGNOSIS — K219 Gastro-esophageal reflux disease without esophagitis: Secondary | ICD-10-CM | POA: Diagnosis not present

## 2023-05-10 DIAGNOSIS — Z79899 Other long term (current) drug therapy: Secondary | ICD-10-CM | POA: Insufficient documentation

## 2023-05-10 DIAGNOSIS — E039 Hypothyroidism, unspecified: Secondary | ICD-10-CM | POA: Insufficient documentation

## 2023-05-10 DIAGNOSIS — I509 Heart failure, unspecified: Secondary | ICD-10-CM | POA: Diagnosis not present

## 2023-05-10 DIAGNOSIS — Z7901 Long term (current) use of anticoagulants: Secondary | ICD-10-CM | POA: Diagnosis not present

## 2023-05-10 DIAGNOSIS — I110 Hypertensive heart disease with heart failure: Secondary | ICD-10-CM | POA: Diagnosis not present

## 2023-05-10 DIAGNOSIS — M549 Dorsalgia, unspecified: Secondary | ICD-10-CM | POA: Insufficient documentation

## 2023-05-10 DIAGNOSIS — N320 Bladder-neck obstruction: Secondary | ICD-10-CM | POA: Insufficient documentation

## 2023-05-10 DIAGNOSIS — I480 Paroxysmal atrial fibrillation: Secondary | ICD-10-CM | POA: Diagnosis not present

## 2023-05-10 DIAGNOSIS — M1990 Unspecified osteoarthritis, unspecified site: Secondary | ICD-10-CM | POA: Diagnosis not present

## 2023-05-10 DIAGNOSIS — R310 Gross hematuria: Secondary | ICD-10-CM | POA: Insufficient documentation

## 2023-05-10 DIAGNOSIS — N500 Atrophy of testis: Secondary | ICD-10-CM | POA: Insufficient documentation

## 2023-05-10 HISTORY — PX: CYSTOSCOPY/URETEROSCOPY/HOLMIUM LASER/STENT PLACEMENT: SHX6546

## 2023-05-10 SURGERY — CYSTOSCOPY/URETEROSCOPY/HOLMIUM LASER/STENT PLACEMENT
Anesthesia: General | Site: Ureter | Laterality: Left

## 2023-05-10 MED ORDER — FENTANYL CITRATE PF 50 MCG/ML IJ SOSY
PREFILLED_SYRINGE | INTRAMUSCULAR | Status: AC
  Filled 2023-05-10: qty 1

## 2023-05-10 MED ORDER — OXYCODONE HCL 5 MG PO TABS: 5.0000 mg | ORAL_TABLET | ORAL | Status: DC | PRN

## 2023-05-10 MED ORDER — EPHEDRINE SULFATE (PRESSORS) 50 MG/ML IJ SOLN
INTRAMUSCULAR | Status: DC | PRN
  Administered 2023-05-10: 5 mg via INTRAVENOUS

## 2023-05-10 MED ORDER — PROPOFOL 10 MG/ML IV BOLUS
INTRAVENOUS | Status: DC | PRN
  Administered 2023-05-10: 120 mg via INTRAVENOUS

## 2023-05-10 MED ORDER — ORAL CARE MOUTH RINSE: 15.0000 mL | Freq: Once | OROMUCOSAL | Status: AC

## 2023-05-10 MED ORDER — CHLORHEXIDINE GLUCONATE 0.12 % MT SOLN
15.0000 mL | Freq: Once | OROMUCOSAL | Status: AC
  Administered 2023-05-10: 15 mL via OROMUCOSAL

## 2023-05-10 MED ORDER — OXYCODONE-ACETAMINOPHEN 5-325 MG PO TABS
1.0000 | ORAL_TABLET | Freq: Four times a day (QID) | ORAL | 0 refills | Status: DC | PRN
  Filled 2023-05-10: qty 15, 4d supply, fill #0

## 2023-05-10 MED ORDER — FENTANYL CITRATE (PF) 100 MCG/2ML IJ SOLN
INTRAMUSCULAR | Status: AC
  Filled 2023-05-10: qty 2

## 2023-05-10 MED ORDER — FENTANYL CITRATE (PF) 100 MCG/2ML IJ SOLN
INTRAMUSCULAR | Status: DC | PRN
  Administered 2023-05-10: 25 ug via INTRAVENOUS
  Administered 2023-05-10: 50 ug via INTRAVENOUS
  Administered 2023-05-10: 25 ug via INTRAVENOUS

## 2023-05-10 MED ORDER — CEFAZOLIN SODIUM-DEXTROSE 2-4 GM/100ML-% IV SOLN
2.0000 g | INTRAVENOUS | Status: AC
  Administered 2023-05-10: 2 g via INTRAVENOUS
  Filled 2023-05-10: qty 100

## 2023-05-10 MED ORDER — DROPERIDOL 2.5 MG/ML IJ SOLN: 0.6250 mg | Freq: Once | INTRAMUSCULAR | Status: DC | PRN

## 2023-05-10 MED ORDER — LIDOCAINE HCL (PF) 2 % IJ SOLN
INTRAMUSCULAR | Status: DC | PRN
  Administered 2023-05-10: 60 mg via INTRADERMAL

## 2023-05-10 MED ORDER — IOHEXOL 300 MG/ML  SOLN
INTRAMUSCULAR | Status: DC | PRN
  Administered 2023-05-10: 7 mL via URETHRAL

## 2023-05-10 MED ORDER — SODIUM CHLORIDE 0.9 % IR SOLN
Status: DC | PRN
  Administered 2023-05-10: 3000 mL

## 2023-05-10 MED ORDER — ONDANSETRON HCL 4 MG/2ML IJ SOLN
INTRAMUSCULAR | Status: DC | PRN
  Administered 2023-05-10: 4 mg via INTRAVENOUS

## 2023-05-10 MED ORDER — PROPOFOL 10 MG/ML IV BOLUS
INTRAVENOUS | Status: AC
Start: 2023-05-10 — End: ?
  Filled 2023-05-10: qty 20

## 2023-05-10 MED ORDER — SODIUM CHLORIDE 0.9% FLUSH: 3.0000 mL | Freq: Two times a day (BID) | INTRAVENOUS | Status: DC

## 2023-05-10 MED ORDER — LIDOCAINE 2% (20 MG/ML) 5 ML SYRINGE: INTRAMUSCULAR | Status: DC | PRN

## 2023-05-10 MED ORDER — LACTATED RINGERS IV SOLN: INTRAVENOUS | Status: DC

## 2023-05-10 MED ORDER — ACETAMINOPHEN 500 MG PO TABS
1000.0000 mg | ORAL_TABLET | Freq: Once | ORAL | Status: DC
  Filled 2023-05-10: qty 2

## 2023-05-10 MED ORDER — FENTANYL CITRATE PF 50 MCG/ML IJ SOSY
25.0000 ug | PREFILLED_SYRINGE | INTRAMUSCULAR | Status: DC | PRN
  Administered 2023-05-10 (×2): 50 ug via INTRAVENOUS

## 2023-05-10 MED ORDER — DEXAMETHASONE SODIUM PHOSPHATE 4 MG/ML IJ SOLN
INTRAMUSCULAR | Status: DC | PRN
  Administered 2023-05-10: 5 mg via INTRAVENOUS

## 2023-05-10 SURGICAL SUPPLY — 25 items
BAG URO CATCHER STRL LF (MISCELLANEOUS) ×1 IMPLANT
BASKET STONE NCOMPASS (UROLOGICAL SUPPLIES) IMPLANT
CATH URETERAL DUAL LUMEN 10F (MISCELLANEOUS) IMPLANT
CATH URETL OPEN 5X70 (CATHETERS) IMPLANT
CLOTH BEACON ORANGE TIMEOUT ST (SAFETY) ×1 IMPLANT
EXTRACTOR STONE NITINOL NGAGE (UROLOGICAL SUPPLIES) IMPLANT
GLOVE SURG SS PI 8.0 STRL IVOR (GLOVE) ×1 IMPLANT
GOWN STRL REUS W/ TWL XL LVL3 (GOWN DISPOSABLE) ×1 IMPLANT
GOWN STRL REUS W/TWL XL LVL3 (GOWN DISPOSABLE) ×1
GUIDEWIRE STR DUAL SENSOR (WIRE) ×1 IMPLANT
IV NS IRRIG 3000ML ARTHROMATIC (IV SOLUTION) ×1 IMPLANT
KIT TURNOVER KIT A (KITS) IMPLANT
LASER FIB FLEXIVA PULSE ID 365 (Laser) IMPLANT
LASER FIB FLEXIVA PULSE ID 550 (Laser) IMPLANT
LASER FIB FLEXIVA PULSE ID 910 (Laser) IMPLANT
MANIFOLD NEPTUNE II (INSTRUMENTS) ×1 IMPLANT
PACK CYSTO (CUSTOM PROCEDURE TRAY) ×1 IMPLANT
PAD PREP 24X48 CUFFED NSTRL (MISCELLANEOUS) ×1 IMPLANT
SHEATH NAVIGATOR HD 11/13X36 (SHEATH) IMPLANT
SHEATH NAVIGATOR HD 12/14X46 (SHEATH) IMPLANT
STENT URET 6FRX26 CONTOUR (STENTS) IMPLANT
TRACTIP FLEXIVA PULS ID 200XHI (Laser) IMPLANT
TRACTIP FLEXIVA PULSE ID 200 (Laser)
TUBING CONNECTING 10 (TUBING) ×1 IMPLANT
TUBING UROLOGY SET (TUBING) ×1 IMPLANT

## 2023-05-10 NOTE — Transfer of Care (Signed)
Immediate Anesthesia Transfer of Care Note  Patient: Keith [name].  Procedure(s) Performed: CYSTOSCOPY LEFT RETROGRADE LEFT URETEROSCOPY/HOLMIUM LASER/STENT PLACEMENT (Left: Ureter)  Patient Location: PACU  Anesthesia Type:General  Level of Consciousness: awake, alert , and oriented  Airway & Oxygen Therapy: Patient Spontanous Breathing and Patient connected to nasal cannula oxygen  Post-op Assessment: Report given to RN and Post -op Vital signs reviewed and stable  Post vital signs: Reviewed and stable  Last Vitals:  Vitals Value Taken Time  BP    Temp    Pulse 60 05/10/23 0858  Resp 14 05/10/23 0858  SpO2 99 % 05/10/23 0858  Vitals shown include unfiled device data.  Last Pain:  Vitals:   05/10/23 0558  TempSrc:   PainSc: 0-No pain      Patients Stated Pain Goal: 6 (05/10/23 0558)  Complications: No notable events documented.

## 2023-05-10 NOTE — Anesthesia Postprocedure Evaluation (Signed)
Anesthesia Post Note  Patient: Keith [name].  Procedure(s) Performed: CYSTOSCOPY LEFT RETROGRADE LEFT URETEROSCOPY/HOLMIUM LASER/STENT PLACEMENT (Left: Ureter)     Patient location during evaluation: PACU Anesthesia Type: General Level of consciousness: sedated and patient cooperative Pain management: pain level controlled Vital Signs Assessment: post-procedure vital signs reviewed and stable Respiratory status: spontaneous breathing Cardiovascular status: stable Anesthetic complications: no   No notable events documented.  Last Vitals:  Vitals:   05/10/23 1000 05/10/23 1015  BP: (!) 147/71 139/79  Pulse: (!) 57 (!) 51  Resp:    Temp:    SpO2: 98% 98%    Last Pain:  Vitals:   05/10/23 1030  TempSrc:   PainSc: 5                  Lewie Loron

## 2023-05-10 NOTE — Anesthesia Procedure Notes (Signed)
Procedure Name: LMA Insertion Date/Time: 05/10/2023 7:34 AM  Performed by: Nathen May, CRNAPre-anesthesia Checklist: Patient identified, Emergency Drugs available, Suction available and Patient being monitored Patient Re-evaluated:Patient Re-evaluated prior to induction Oxygen Delivery Method: Circle System Utilized Preoxygenation: Pre-oxygenation with 100% oxygen Induction Type: IV induction Ventilation: Mask ventilation without difficulty LMA: LMA inserted LMA Size: 4.0 Number of attempts: 1 Airway Equipment and Method: Bite block Placement Confirmation: positive ETCO2 Tube secured with: Tape Dental Injury: Teeth and Oropharynx as per pre-operative assessment

## 2023-05-10 NOTE — Op Note (Signed)
Procedure: 1.  Cystoscopy with left retrograde pyelogram interpretation. 2.  Left ureteroscopy with holmium laser application, stone extraction and insertion of double-J stent. 3.  Application of fluoroscopy.  Preop diagnosis: Gross hematuria with left renal stones.  Postop diagnosis: Same.  Surgeon: Dr. Bjorn Pippin.  Anesthesia: General.  Specimen: Stone fragments.  Drains: 6 French by 26 cm right contour double-J stent with tether.  EBL: None.  Complications: None.  Indications: The patient is an 82 year old male whose had persistent gross hematuria with thin clots and evaluation demonstrated only left renal stones on CT.  It was felt that he needed cystoscopy with ureteroscopy for further evaluation.  Procedure: He was taken the operating room was given antibiotics.  A general anesthetic was induced.  He was placed in lithotomy position and fitted with PAS hose.  His perineum and genitalia were prepped with Betadine solution he was draped in usual sterile fashion.  Cystoscopy was performed using the 21 [place] scope and 30 degree lens.  Examination revealed a normal urethra.  The external sphincter was intact.  The prostatic urethra was approximately 3 cm in length with some lateral lobe hyperplasia and a prominent middle lobe.  Examination of the bladder revealed mild trabeculation with no mucosal lesions.  Ureteral orifices were unremarkable.  The left ureteral orifice was cannulated with 5 [place] open-ended catheter and Omnipaque was instilled.  Left retrograde pyelogram revealed a normal caliber ureter to the kidney without filling defects.  There were filling defects in the upper and lower pole consistent with the stones which could be seen on the precontrast view but no other renal abnormalities were noted.  A sensor wire was then advanced to the kidney under fluoroscopic guidance and the cystoscope was removed.  The inner core of a 46 cm 11/13 [place] digital access sheath was  then advanced the kidney over the wire without difficulty under fluoroscopic guidance.  This was then followed by the assembled sheath.  The inner core and wire were then removed.  The dual-lumen digital flexible scope was then passed to the kidney under visual guidance.  The upper pole stone was noted in upper calyx and it was fairly mobile and the likely source of the bleeding.  There was an additional pair of stones in the lower calyx.  No other obvious sources of bleeding were identified.  Once a thorough inspection of been performed a 242 m laser fiber was passed through the scope and the laser was set on the dusting setting with 0.3 J and 60 Hz on the left pedal and 0.8 J and 10 Hz on the right pedal.  A combination of settings was used initially to fragment the upper pole stone.  The fragments were then removed with an engage basket.  I then fragmented the lower pole stone and removed the bulk of the fragments with the engage basket as well.  A few small fragments and grip were not retrievable.  Once final fluoroscopic and ureteroscopic inspection demonstrated no clinically significant stone fragments, a sensor wire was passed to the scope through the kidney and the scope and sheath were then removed.  The cystoscope was then reinserted over the wire and a 6 [place] by 26 cm contour double-J stent was advanced the kidney under fluoroscopic guidance.  The wire was removed, leaving good coil in the kidney and a good coil in the bladder.  The stent tether was then left exiting urethra after the bladder was drained and the cystoscope was removed.  He  has a tape allergy so I knotted the string near the meatus and trimmed to approximately 3 cm from the meatus.  He was then taken down from lithotomy position, his anesthetic was reversed and he was moved recovery room in stable condition.  There were no complications.

## 2023-05-10 NOTE — Interval H&P Note (Signed)
History and Physical Interval Note: He continues to have intermittent thin clots.   05/10/2023 7:16 AM  Keith [name].  has presented today for surgery, with the diagnosis of LEFT RENAL STONES AND HEMATURIA.  The various methods of treatment have been discussed with the patient and family. After consideration of risks, benefits and other options for treatment, the patient has consented to  Procedure(s) with comments: CYSTOSCOPY LEFT RETROGRADE LEFT URETEROSCOPY/HOLMIUM LASER/STENT PLACEMENT (Left) - 1 HR FOR CASE as a surgical intervention.  The patient's history has been reviewed, patient examined, no change in status, stable for surgery.  I have reviewed the patient's chart and labs.  Questions were answered to the patient's satisfaction.     Bjorn Pippin

## 2023-05-10 NOTE — Discharge Instructions (Addendum)
Please be careful with the string coming out of the urethra and avoid pulling it.  You may pull the stent out by using the attached string on the morning of 05/16/23 but if you don't feel you can do that, please contact the office to have it removed that day.   Let us know if the string disappears because they can sometimes retract and we could have to look in the bladder to remove the stent if that happens.   Please bring the stone fragments to the bladder.   You may resume the Eliquis on Thursday of this week if there is minimal to no bleeding.

## 2023-05-11 ENCOUNTER — Encounter (HOSPITAL_COMMUNITY): Payer: Self-pay | Admitting: Urology

## 2023-05-16 DIAGNOSIS — N200 Calculus of kidney: Secondary | ICD-10-CM | POA: Diagnosis not present

## 2023-05-19 ENCOUNTER — Ambulatory Visit: Payer: 59 | Attending: Internal Medicine

## 2023-05-19 ENCOUNTER — Telehealth: Payer: Self-pay

## 2023-05-19 DIAGNOSIS — Z952 Presence of prosthetic heart valve: Secondary | ICD-10-CM | POA: Diagnosis not present

## 2023-05-19 LAB — ECHOCARDIOGRAM COMPLETE
AR max vel: 1.93 cm2
AV Area VTI: 1.87 cm2
AV Area mean vel: 2.08 cm2
AV Mean grad: 8 mm[Hg]
AV Peak grad: 15.5 mm[Hg]
Ao pk vel: 1.97 m/s
Calc EF: 64.9 %
MV VTI: 2.14 cm2
S' Lateral: 3.3 cm
Single Plane A2C EF: 70.1 %
Single Plane A4C EF: 57.6 %

## 2023-05-19 MED ORDER — PERFLUTREN LIPID MICROSPHERE
1.0000 mL | INTRAVENOUS | Status: AC | PRN
  Administered 2023-05-19: 6 mL via INTRAVENOUS

## 2023-05-19 NOTE — Telephone Encounter (Signed)
-----   Message from Keith [name] sent at 05/19/2023  4:28 PM EST ----- Normal pumping function of the heart and normal functioning of the aortic prosthesis with trivial aortic regurgitation.  Mildly elevated PASP.  Overall normal echocardiogram, no changes in the plan.

## 2023-05-19 NOTE — Telephone Encounter (Signed)
Patient informed and verbalized understanding of plan. 

## 2023-05-25 DIAGNOSIS — N200 Calculus of kidney: Secondary | ICD-10-CM | POA: Diagnosis not present

## 2023-05-30 ENCOUNTER — Other Ambulatory Visit: Payer: Self-pay

## 2023-06-01 ENCOUNTER — Other Ambulatory Visit (HOSPITAL_COMMUNITY): Payer: Self-pay

## 2023-07-04 DIAGNOSIS — N200 Calculus of kidney: Secondary | ICD-10-CM | POA: Diagnosis not present

## 2023-07-08 ENCOUNTER — Other Ambulatory Visit (HOSPITAL_COMMUNITY): Payer: Self-pay

## 2023-07-09 ENCOUNTER — Other Ambulatory Visit (HOSPITAL_COMMUNITY): Payer: Self-pay

## 2023-07-09 MED ORDER — LISINOPRIL-HYDROCHLOROTHIAZIDE 10-12.5 MG PO TABS
1.0000 | ORAL_TABLET | Freq: Every day | ORAL | 0 refills | Status: DC
  Filled 2023-07-09: qty 90, 90d supply, fill #0

## 2023-07-11 ENCOUNTER — Other Ambulatory Visit: Payer: Self-pay

## 2023-07-11 ENCOUNTER — Other Ambulatory Visit (HOSPITAL_COMMUNITY): Payer: Self-pay

## 2023-07-11 DIAGNOSIS — N200 Calculus of kidney: Secondary | ICD-10-CM | POA: Diagnosis not present

## 2023-07-11 DIAGNOSIS — N401 Enlarged prostate with lower urinary tract symptoms: Secondary | ICD-10-CM | POA: Diagnosis not present

## 2023-07-11 DIAGNOSIS — R3912 Poor urinary stream: Secondary | ICD-10-CM | POA: Diagnosis not present

## 2023-07-11 DIAGNOSIS — R310 Gross hematuria: Secondary | ICD-10-CM | POA: Diagnosis not present

## 2023-07-11 MED ORDER — FINASTERIDE 5 MG PO TABS
5.0000 mg | ORAL_TABLET | Freq: Every day | ORAL | 3 refills | Status: DC
  Filled 2023-07-11 – 2023-10-23 (×2): qty 90, 90d supply, fill #0
  Filled 2024-04-19: qty 90, 90d supply, fill #1

## 2023-07-11 MED ORDER — TAMSULOSIN HCL 0.4 MG PO CAPS
0.4000 mg | ORAL_CAPSULE | Freq: Every day | ORAL | 3 refills | Status: DC
  Filled 2023-07-11 – 2023-07-28 (×3): qty 100, 100d supply, fill #0
  Filled 2023-09-07: qty 90, 90d supply, fill #0
  Filled 2023-09-12 – 2023-09-14 (×2): qty 100, 100d supply, fill #0
  Filled 2023-09-19: qty 90, 90d supply, fill #0
  Filled 2024-02-10: qty 90, 90d supply, fill #1

## 2023-07-27 ENCOUNTER — Other Ambulatory Visit (HOSPITAL_COMMUNITY): Payer: Self-pay

## 2023-07-28 ENCOUNTER — Other Ambulatory Visit (HOSPITAL_COMMUNITY): Payer: Self-pay

## 2023-07-29 ENCOUNTER — Other Ambulatory Visit (HOSPITAL_COMMUNITY): Payer: Self-pay

## 2023-08-03 ENCOUNTER — Other Ambulatory Visit (HOSPITAL_COMMUNITY): Payer: Self-pay

## 2023-08-03 ENCOUNTER — Other Ambulatory Visit: Payer: Self-pay

## 2023-08-03 MED ORDER — LEVOTHYROXINE SODIUM 75 MCG PO TABS
75.0000 ug | ORAL_TABLET | Freq: Every day | ORAL | 0 refills | Status: DC
  Filled 2023-08-03: qty 30, 30d supply, fill #0

## 2023-08-04 ENCOUNTER — Encounter (HOSPITAL_BASED_OUTPATIENT_CLINIC_OR_DEPARTMENT_OTHER): Payer: Self-pay | Admitting: Family Medicine

## 2023-08-04 ENCOUNTER — Other Ambulatory Visit (HOSPITAL_COMMUNITY): Payer: Self-pay

## 2023-08-04 ENCOUNTER — Ambulatory Visit (INDEPENDENT_AMBULATORY_CARE_PROVIDER_SITE_OTHER): Payer: 59 | Admitting: Family Medicine

## 2023-08-04 ENCOUNTER — Other Ambulatory Visit (HOSPITAL_BASED_OUTPATIENT_CLINIC_OR_DEPARTMENT_OTHER): Payer: Self-pay

## 2023-08-04 ENCOUNTER — Other Ambulatory Visit: Payer: Self-pay

## 2023-08-04 VITALS — BP 138/80 | HR 60 | Ht 71.0 in | Wt 193.6 lb

## 2023-08-04 DIAGNOSIS — L570 Actinic keratosis: Secondary | ICD-10-CM | POA: Insufficient documentation

## 2023-08-04 DIAGNOSIS — I359 Nonrheumatic aortic valve disorder, unspecified: Secondary | ICD-10-CM | POA: Insufficient documentation

## 2023-08-04 DIAGNOSIS — E039 Hypothyroidism, unspecified: Secondary | ICD-10-CM | POA: Diagnosis not present

## 2023-08-04 DIAGNOSIS — Z7901 Long term (current) use of anticoagulants: Secondary | ICD-10-CM | POA: Diagnosis not present

## 2023-08-04 DIAGNOSIS — N1831 Chronic kidney disease, stage 3a: Secondary | ICD-10-CM

## 2023-08-04 DIAGNOSIS — R7301 Impaired fasting glucose: Secondary | ICD-10-CM

## 2023-08-04 DIAGNOSIS — E782 Mixed hyperlipidemia: Secondary | ICD-10-CM | POA: Diagnosis not present

## 2023-08-04 DIAGNOSIS — G609 Hereditary and idiopathic neuropathy, unspecified: Secondary | ICD-10-CM | POA: Diagnosis not present

## 2023-08-04 DIAGNOSIS — K219 Gastro-esophageal reflux disease without esophagitis: Secondary | ICD-10-CM | POA: Insufficient documentation

## 2023-08-04 DIAGNOSIS — I10 Essential (primary) hypertension: Secondary | ICD-10-CM

## 2023-08-04 DIAGNOSIS — I712 Thoracic aortic aneurysm, without rupture, unspecified: Secondary | ICD-10-CM | POA: Insufficient documentation

## 2023-08-04 DIAGNOSIS — I4821 Permanent atrial fibrillation: Secondary | ICD-10-CM

## 2023-08-04 MED ORDER — LEVOTHYROXINE SODIUM 75 MCG PO TABS
75.0000 ug | ORAL_TABLET | Freq: Every day | ORAL | 3 refills | Status: DC
  Filled 2023-08-04 (×2): qty 90, 90d supply, fill #0
  Filled 2023-11-01 – 2023-11-02 (×2): qty 30, 30d supply, fill #1

## 2023-08-04 MED ORDER — APIXABAN 5 MG PO TABS
5.0000 mg | ORAL_TABLET | Freq: Two times a day (BID) | ORAL | 3 refills | Status: AC
  Filled 2023-08-04 – 2023-11-01 (×2): qty 180, 90d supply, fill #0
  Filled 2024-02-10: qty 180, 90d supply, fill #1
  Filled 2024-05-07: qty 180, 90d supply, fill #2
  Filled 2024-08-01: qty 180, 90d supply, fill #3
  Filled 2024-08-02 – 2024-08-03 (×2): qty 180, 90d supply, fill #0

## 2023-08-04 MED ORDER — GABAPENTIN 100 MG PO CAPS
100.0000 mg | ORAL_CAPSULE | Freq: Three times a day (TID) | ORAL | 3 refills | Status: DC | PRN
  Filled 2023-08-04: qty 90, 30d supply, fill #0
  Filled 2023-09-07: qty 90, 30d supply, fill #1
  Filled 2023-11-01: qty 90, 30d supply, fill #2

## 2023-08-04 MED ORDER — LISINOPRIL-HYDROCHLOROTHIAZIDE 10-12.5 MG PO TABS
1.0000 | ORAL_TABLET | Freq: Every day | ORAL | 3 refills | Status: AC
  Filled 2023-08-04 – 2023-11-01 (×2): qty 90, 90d supply, fill #0
  Filled 2024-02-10: qty 90, 90d supply, fill #1
  Filled 2024-05-07: qty 90, 90d supply, fill #2
  Filled 2024-08-01: qty 90, 90d supply, fill #3

## 2023-08-04 MED ORDER — PANTOPRAZOLE SODIUM 40 MG PO TBEC
40.0000 mg | DELAYED_RELEASE_TABLET | Freq: Every day | ORAL | 3 refills | Status: DC
  Filled 2023-08-04: qty 90, 90d supply, fill #0
  Filled 2023-11-01: qty 90, 90d supply, fill #1
  Filled 2024-02-10: qty 90, 90d supply, fill #2
  Filled 2024-05-07: qty 90, 90d supply, fill #3

## 2023-08-04 NOTE — Progress Notes (Signed)
New Patient Office Visit  Subjective:   Keith [name] 31-Dec-1940 08/04/2023  Chief Complaint  Patient presents with   New Patient (Initial Visit)    Patient is here today to get established with the practice. Denies any real concerns for today's visit. Is needing medications refilled.    HPI: Keith [name]. presents today to establish care at Primary [facility] Medicine at [facility]. Introduced to Publishing rights manager role and practice setting.  All questions answered.   Last PCP: Nita Sells, [place] Concerns: See below    HYPERTENSION: Keith [name]. presents for the medical management of hypertension.  Patient's current hypertension medication regimen is: Lisinopril-hydrochlorothiazide 10-12.5mg  every day  Patient is  currently taking prescribed medications for HTN.  Patient is  regularly keeping a check on BP at home.  Adhering to low sodium diet: Yes Exercising Regularly: Yes Denies headache, dizziness, CP, SHOB, vision changes.   BP Readings from Last 3 Encounters:  08/04/23 138/80  05/10/23 139/79  05/03/23 (!) 142/86    ATRIAL FIBRILLATION: Keith [name]. presents for the medical management of atrial fibrillation.  Is  followed by cardiology w/ [facility]. He sees them yearly. Last visit was October 2024. Visit reviewed by PCP.   Atrial fibrillation status: controlled Ventricular rate control: Not indicated Anti-coagulation:  Eliquis 5mg  BID Medication compliance: excellent compliance Denies CP, palpitations, SHOB, syncope, edema, extreme fatigue.     Neuropathy:  Patient reports neuropathy to feet and hands bilaterally for approx. 6 months. Had hx of carpal tunnel syndrome repaired surgically. He is currently taking the levocarnitine acetyl supplement as a natural supplement for nerve pain relief. Pt has hx of several back surgeries with likely nerve involvement. He states the tingling sensation in his hands  is constant.    GERD: Keith [name]. presents for the medical management of GERD.  Current medication: Nexium OTC- patient would like to get Rx for Pantoprazole 40mg  daily.  Well controlled: Currently yes, would like to change to pantoprazole for extended control.    Alleviatiating factors:  Pepcid, Nexium Aggravating factors: Certain Foods Antacid use frequency:  Pepcid BID   IMPAIRED FASTING GLUCOSE Keith [name]. is here for medical management of impaired fasting glucose.  Patient's current IFG medication regimen is: Diet, Regular Exercise Adhering to a diabetic diet: Yes Exercising Regularly: Yes Checking Blood Sugars: No Denies polydipsia, polyphagia, polyuria.   Lab Results  Component Value Date   HGBA1C 5.4 09/09/2017    CHRONIC KIDNEY DISEASE: Keith [name]. presents for the medical management of Chronic Kidney Disease stage 3A.  Patient is  adhering to renal diet. Patient is  on ACE1/ARB therapy. (Lisinopril-hydrochlorothiazide) Patient is  avoiding NSAIDS.    He does not attend dialysis and does not perform peritoneal dialysis.   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 Patient's wife is concerned for recurring lesions to patient's scalp and one lesion to area behind right ear.    The following portions of the patient's  history were reviewed and updated as appropriate: past medical history, past surgical history, family history, social history, allergies, medications, and problem list.   Patient Active Problem List   Diagnosis Date Noted   Aneurysm of thoracic aorta (HCC)  08/04/2023   Aortic valve disorder 08/04/2023   Permanent atrial fibrillation (HCC) 05/03/2023   Paroxysmal atrial flutter (HCC) 05/03/2023   History of stroke 05/03/2023   Chronic anticoagulation 05/03/2023   Idiopathic peripheral neuropathy 10/22/2022   Impaired fasting glucose 10/22/2022   Hypocapnia 04/07/2022   Arthritis of left knee    S/P TKR (total knee replacement), left 12/31/2021   Arthritis of left shoulder region    S/P reverse total shoulder arthroplasty, left 09/01/2021   Bilateral carpal tunnel syndrome 07/20/2021   Chronic kidney disease, stage 3a (HCC) 07/19/2021   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 05/13/2021   Basal cell carcinoma of skin 05/13/2021   Chronic low back pain 05/13/2021   History of urinary stone 05/13/2021   Status post left hip replacement 12/19/2020   Primary osteoarthritis of left hip 12/04/2020   Hyperlipidemia 04/30/2020   Cataract 10/10/2019   Right ureteral stone 10/01/2019   Left ureteral stone 10/01/2019   S/P lumbar fusion 06/15/2019   Preop cardiovascular exam 09/12/2017   Acquired hypothyroidism 08/10/2017   Benign prostatic hyperplasia with nocturia 08/10/2017   S/P lumbar laminectomy 06/01/2017   PAF (paroxysmal atrial fibrillation) (HCC) 09/07/2016   S/P AVR 01/18/2014   Hypertension 02/22/2013   Past Medical History:  Diagnosis Date   Arthritis    Atrial fibrillation (HCC)    Cardioversion 2016   Atrial flutter (HCC)    Cardioversion 2013   Blood transfusion without reported diagnosis    Cancer (HCC)    Skin- Basil/Squamous cell   Cataract 2022   Clotting disorder (HCC)    On blood thinners   Dysrhythmia    A.fib, cardioversions x2   GERD (gastroesophageal reflux disease)    History of aortic valve replacement with bioprosthetic valve    Aortic regurgitation   History of chicken pox    History of kidney stones    History of skin cancer    History of stroke 2011   Hypertension     Hypothyroidism    Kidney stone    Neuromuscular disorder (HCC) 04/2023   Neuropathy of feet   Seasonal allergies    Stroke [facility])    after heart surgery 2010   Past Surgical History:  Procedure Laterality Date   AORTIC VALVE REPLACEMENT  2009   Bioprosthetic AVR and root replacement   BACK SURGERY     total of 4 back surgeries   BILATERAL CARPAL TUNNEL RELEASE Bilateral    2022,2023   CARDIAC VALVE REPLACEMENT  2010   Aortic valve   CATARACT EXTRACTION, BILATERAL     COLONOSCOPY     CYSTOSCOPY WITH RETROGRADE PYELOGRAM, URETEROSCOPY AND STENT PLACEMENT Bilateral 10/01/2019   Procedure: CYSTOSCOPY WITH RETROGRADE PYELOGRAM, URETEROSCOPY AND STENT PLACEMENT;  Surgeon: Bjorn Pippin, MD;  Location: [facility];  Service: Urology;  Laterality: Bilateral;   CYSTOSCOPY/URETEROSCOPY/HOLMIUM LASER/STENT PLACEMENT Left 05/10/2023   Procedure: CYSTOSCOPY LEFT RETROGRADE LEFT URETEROSCOPY/HOLMIUM LASER/STENT PLACEMENT;  Surgeon: Bjorn Pippin, MD;  Location: [facility];  Service: Urology;  Laterality: Left;  1 HR FOR CASE   EPIGASTRIC HERNIA REPAIR     HERNIA REPAIR     JOINT REPLACEMENT  2023   Knee, Hip, shoulder   LUMBAR LAMINECTOMY/DECOMPRESSION MICRODISCECTOMY Right 06/01/2017   Procedure: EXTRAFORAMINAL MICRODISCECTOMY LUMBAR  FIVE- SACRAL ONE, RIGHT;  Surgeon: Tia Alert, MD;  Location: [facility] OR;  Service: Neurosurgery;  Laterality: Right;   LUMBAR LAMINECTOMY/DECOMPRESSION MICRODISCECTOMY Bilateral 09/04/2018   Procedure: Laminectomy and Foraminotomy - Lumbar two-three bilateral;  Surgeon: Tia Alert, MD;  Location[facility];  Service: Neurosurgery;  Laterality: Bilateral;   REVERSE SHOULDER ARTHROPLASTY Left 09/01/2021   Procedure: LEFT REVERSE SHOULDER ARTHROPLASTY;  Surgeon: Cammy Copa, MD;  Location: [facility] OR;  Service: Orthopedics;  Laterality: Left;   SPINE SURGERY  2020   Lumbar laminectomy   TOTAL HIP ARTHROPLASTY Left 12/19/2020   Procedure: LEFT TOTAL HIP ARTHROPLASTY ANTERIOR  APPROACH;  Surgeon: Kathryne Hitch, MD;  Location: [facility];  Service: Orthopedics;  Laterality: Left;   TOTAL KNEE ARTHROPLASTY Left 12/31/2021   Procedure: TOTAL KNEE ARTHROPLASTY;  Surgeon: Cammy Copa, MD;  Location: [facility] OR;  Service: Orthopedics;  Laterality: Left;   TRANSFORAMINAL LUMBAR INTERBODY FUSION (TLIF) WITH PEDICLE SCREW FIXATION 1 LEVEL Left 06/15/2019   Procedure: Decompression and Instrumention Fusion Lumbar Four- Five;  Surgeon: Tia Alert, MD;  Location: [facility] OR;  Service: Neurosurgery;  Laterality: Left;  Decompression and Instrumention Fusion Lumbar Four- Five   Family History  Problem Relation Age of Onset   Colon cancer Neg Hx    Esophageal cancer Neg Hx    Rectal cancer Neg Hx    Stomach cancer Neg Hx    Social History   Socioeconomic History   Marital status: Married    Spouse name: Okey Regal   Number of children: 1   Years of education: Not on file   Highest education level: 12th grade  Occupational History   Occupation: Surveyor, minerals  Tobacco Use   Smoking status: Never    Passive exposure: Past   Smokeless tobacco: Never  Vaping Use   Vaping status: Never Used  Substance and Sexual Activity   Alcohol use: Not Currently    Comment: occasional   Drug use: No   Sexual activity: Yes    Comment: Vasectomy  Other Topics Concern   Not on file  Social History Narrative   Not on file   Social Drivers of Health   Financial Resource Strain: Low Risk  (07/28/2023)   Overall Financial Resource Strain (CARDIA)    Difficulty of Paying Living Expenses: Not hard at all  Food Insecurity: No Food Insecurity (07/28/2023)   Hunger Vital Sign    Worried About Running Out of Food in the Last Year: Never true    Ran Out of Food in the Last Year: Never true  Transportation Needs: No Transportation Needs (07/28/2023)   PRAPARE - Administrator, Civil Service (Medical): No    Lack of Transportation (Non-Medical): No  Physical Activity: Inactive  (08/04/2023)   Exercise Vital Sign    Days of Exercise per Week: 0 days    Minutes of Exercise per Session: 0 min  Stress: No Stress Concern Present (07/28/2023)   [employer] of Occupational Health - Occupational Stress Questionnaire    Feeling of Stress : Only a little  Social Connections: Moderately Integrated (08/04/2023)   Social Connection and Isolation Panel [NHANES]    Frequency of Communication with Friends and Family: More than three times a week    Frequency of Social Gatherings with Friends and Family: More than three times a week    Attends Religious Services: Never    Database administrator or Organizations: No    Attends Engineer, structural: More  than 4 times per year    Marital Status: Married  Catering manager Violence: Not At Risk (08/04/2023)   Humiliation, Afraid, Rape, and Kick questionnaire    Fear of Current or Ex-Partner: No    Emotionally Abused: No    Physically Abused: No    Sexually Abused: No   Outpatient Medications Prior to Visit  Medication Sig Dispense Refill   acetaminophen (TYLENOL) 500 MG tablet Take 1,000 mg by mouth every 6 (six) hours as needed for moderate pain.     finasteride (PROSCAR) 5 MG tablet Take 1 tablet (5 mg total) by mouth daily. 90 tablet 3   fluorouracil (EFUDEX) 5 % cream Apply 1 Application topically daily as needed. 40 g PRN   tamsulosin (FLOMAX) 0.4 MG CAPS capsule Take 1 capsule (0.4 mg total) by mouth daily. (Patient taking differently: Take 0.4 mg by mouth at bedtime.) 100 capsule 3   apixaban (ELIQUIS) 5 MG TABS tablet Take 1 tablet (5 mg total) by mouth 2 (two) times daily. 180 tablet 2   levothyroxine (EUTHYROX) 75 MCG tablet Take 1 tablet (75 mcg total) by mouth daily. 30 tablet 0   lisinopril-hydrochlorothiazide (ZESTORETIC) 10-12.5 MG tablet Take 1 tablet by mouth daily. Need appointment for future refills. 90 tablet 0   tamsulosin (FLOMAX) 0.4 MG CAPS capsule Take 1 capsule (0.4 mg total) by mouth daily.  100 capsule 3   diphenhydrAMINE (BENADRYL) 25 MG tablet Take 25 mg by mouth daily as needed for allergies.     finasteride (PROSCAR) 5 MG tablet Take 1 tablet (5 mg total) by mouth daily. (Patient taking differently: Take 5 mg by mouth at bedtime.) 90 tablet 3   Multiple Vitamin (MULTIVITAMIN WITH MINERALS) TABS tablet Take 1 tablet by mouth daily.     Omega-3 Fatty Acids (FISH OIL PO) Take 330 mg by mouth daily.     oxyCODONE-acetaminophen (PERCOCET) 5-325 MG tablet Take 1 tablet by mouth every 6 (six) hours as needed for severe pain (pain score 7-10). 15 tablet 0   No facility-administered medications prior to visit.   Allergies  Allergen Reactions   Tape Rash    Tears skin     ROS: A complete ROS was performed with pertinent positives/negatives noted in the HPI. The remainder of the ROS are negative.   Objective:   Today's Vitals   08/04/23 0835 08/04/23 0900  BP: (!) 154/75 138/80  Pulse: 60   SpO2: 100%   Weight: 193 lb 9.6 oz (87.8 kg)   Height: 5\' 11"  (1.803 m)     GENERAL: Well-appearing, in NAD. Well nourished.  SKIN: Pink, warm and dry. Several flaking, pigmented lesions with mild asymmetry present to diffuse areas of scalp. Small pigmented, flaking and papular lesion present to right post auricular area without bleeding, likely actinic or seborrheic keratosis.  Head: Normocephalic. NECK: Trachea midline. Full ROM w/o pain or tenderness.  EARS: Tympanic membranes are intact, translucent without bulging and without drainage.  RESPIRATORY: Chest wall symmetrical. Respirations even and non-labored. Breath sounds clear to auscultation bilaterally.  CARDIAC: S1, S2 present, regular rate and rhythm without murmur or gallops. Peripheral pulses 2+ bilaterally.  MSK: Muscle tone and strength appropriate for age.  EXTREMITIES: Without clubbing, cyanosis, or edema.  NEUROLOGIC: No motor or sensory deficits. Steady, even gait. C2-C12 intact.  PSYCH/MENTAL STATUS: Alert, oriented x  3. Cooperative, appropriate mood and affect.      Assessment & Plan:  1. Primary hypertension (Primary) Well controlled. Continue on current regimen and  monitoring of BP. Will check electrolytes, liver and renal function with CMP today while fasting.  - lisinopril-hydrochlorothiazide (ZESTORETIC) 10-12.5 MG tablet; Take 1 tablet by mouth daily.  Dispense: 90 tablet; Refill: 3 - Comprehensive metabolic panel  2. Acquired hypothyroidism Well controlled per patient. Will check TSH with labs today and titrate medication as needed.  - levothyroxine (EUTHYROX) 75 MCG tablet; Take 1 tablet (75 mcg total) by mouth daily.  Dispense: 90 tablet; Refill: 3 - TSH  3. Mixed hyperlipidemia Well contolled per patient with diet, exercise. Will check LP w/ fasting labs today. Discussed heart healthy lifestyle with patient.  - Lipid panel  4. Permanent atrial fibrillation (HCC) 5. Chronic anticoagulation Rate is well controlled. Continue anticoagulation with Eliquis per Cardiology recommendations and annual visit w/ Cardiology. Continue to monitor HR and will obtain CBC w/ labs today. Discussed prevention of falls and bleeding risk with patient.  - apixaban (ELIQUIS) 5 MG TABS tablet; Take 1 tablet (5 mg total) by mouth 2 (two) times daily.  Dispense: 180 tablet; Refill: 3 - CBC with Differential/Platelet  6. Idiopathic peripheral neuropathy Uncontrolled. Lyrica considered but cost was a factor. Will trial Gabapentin starting with one 100mg  capsule daily and increase as tolerated/needed for pain control. Discussed safe use of medication, prevention of falls, and possible side effects with patient and he verbalized understanding and wanted to proceed. Follow up in 4 months or sooner if needed. PDMP reviewed.  - gabapentin (NEURONTIN) 100 MG capsule; Take 1 capsule (100 mg total) by mouth 3 (three) times daily as needed (neuropathic pain).  Dispense: 90 capsule; Refill: 3  7. Chronic kidney disease, stage 3a  (HCC) Stable per last BMP review in October 2024. Will repeat w/ CMP today. Discussed avoidance of NSAIDs, hydration, and renal diet.   8. 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  - Ambulatory referral to Dermatology  9. Gastroesophageal reflux disease without esophagitis Controlled. Will switch from Nexium to Pantoprazole EC for extended relief.  - pantoprazole (PROTONIX) 40 MG tablet; Take 1 tablet (40 mg total) by mouth daily.  Dispense: 90 tablet; Refill: 3  10. IFG (impaired fasting glucose) Controlled previously with diet, exercise. Will assess A1C with fasting labs today.  - Hemoglobin A1c   Patient to reach out to office if new, worrisome, or unresolved symptoms arise or if no improvement in patient's condition. Patient verbalized understanding and is agreeable to treatment plan. All questions answered to patient's satisfaction.    Return in about 4 months (around 12/02/2023) for Follow up Neuropathic Pain, HTN.    Hilbert Bible, [place]

## 2023-08-04 NOTE — Patient Instructions (Addendum)
Please get your Shingrix vaccines at your local pharmacy.    Gabapentin:  Start with 1 capsule once daily. You can increase to up 1 capsule three times daily as needed for nerve pain.

## 2023-08-05 ENCOUNTER — Encounter (HOSPITAL_BASED_OUTPATIENT_CLINIC_OR_DEPARTMENT_OTHER): Payer: Self-pay | Admitting: Family Medicine

## 2023-08-05 LAB — COMPREHENSIVE METABOLIC PANEL
ALT: 28 [IU]/L (ref 0–44)
AST: 29 [IU]/L (ref 0–40)
Albumin: 4.2 g/dL (ref 3.7–4.7)
Alkaline Phosphatase: 79 [IU]/L (ref 44–121)
BUN/Creatinine Ratio: 23 (ref 10–24)
BUN: 28 mg/dL — ABNORMAL HIGH (ref 8–27)
Bilirubin Total: 0.5 mg/dL (ref 0.0–1.2)
CO2: 22 mmol/L (ref 20–29)
Calcium: 9.1 mg/dL (ref 8.6–10.2)
Chloride: 105 mmol/L (ref 96–106)
Creatinine, Ser: 1.22 mg/dL (ref 0.76–1.27)
Globulin, Total: 2.4 g/dL (ref 1.5–4.5)
Glucose: 108 mg/dL — ABNORMAL HIGH (ref 70–99)
Potassium: 4.7 mmol/L (ref 3.5–5.2)
Sodium: 143 mmol/L (ref 134–144)
Total Protein: 6.6 g/dL (ref 6.0–8.5)
eGFR: 59 mL/min/{1.73_m2} — ABNORMAL LOW (ref >59)

## 2023-08-05 LAB — TSH: TSH: 2.76 u[IU]/mL (ref 0.450–4.500)

## 2023-08-05 LAB — CBC WITH DIFFERENTIAL/PLATELET
Basophils Absolute: 0 10*3/uL (ref 0.0–0.2)
Basos: 0 %
EOS (ABSOLUTE): 0.1 10*3/uL (ref 0.0–0.4)
Eos: 2 %
Hematocrit: 42.4 % (ref 37.5–51.0)
Hemoglobin: 14.3 g/dL (ref 13.0–17.7)
Immature Grans (Abs): 0 10*3/uL (ref 0.0–0.1)
Immature Granulocytes: 1 %
Lymphocytes Absolute: 1 10*3/uL (ref 0.7–3.1)
Lymphs: 14 %
MCH: 31.2 pg (ref 26.6–33.0)
MCHC: 33.7 g/dL (ref 31.5–35.7)
MCV: 92 fL (ref 79–97)
Monocytes Absolute: 0.6 10*3/uL (ref 0.1–0.9)
Monocytes: 8 %
Neutrophils Absolute: 5.6 10*3/uL (ref 1.4–7.0)
Neutrophils: 75 %
Platelets: 155 10*3/uL (ref 150–450)
RBC: 4.59 x10E6/uL (ref 4.14–5.80)
RDW: 12.8 % (ref 11.6–15.4)
WBC: 7.4 10*3/uL (ref 3.4–10.8)

## 2023-08-05 LAB — HEMOGLOBIN A1C
Est. average glucose Bld gHb Est-mCnc: 105 mg/dL
Hgb A1c MFr Bld: 5.3 % (ref 4.8–5.6)

## 2023-08-05 LAB — LIPID PANEL
Chol/HDL Ratio: 4.9 {ratio} (ref 0.0–5.0)
Cholesterol, Total: 173 mg/dL (ref 100–199)
HDL: 35 mg/dL — ABNORMAL LOW (ref >39)
LDL Chol Calc (NIH): 99 mg/dL (ref 0–99)
Triglycerides: 226 mg/dL — ABNORMAL HIGH (ref 0–149)
VLDL Cholesterol Cal: 39 mg/dL (ref 5–40)

## 2023-08-05 NOTE — Progress Notes (Signed)
Hi Keith [name], Your kidney function, electrolytes and liver function are stable.  Your triglycerides (part of cholesterol) has increased.  This can be improved with dietary changes such as increased diet of omega-3 with increased foods such as fish, lean meats of [place], chicken, beans, olive oils and nuts.  Taking an omega-3 fish oil supplement 1000 mg daily can also help this lab.  Your A1c was normal and your thyroid was also normal as well.  Your blood counts looked great.  I would recommend that we recheck your triglycerides after dietary changes and use of the supplement above at your next appointment.  If you have any questions please let me know

## 2023-08-31 ENCOUNTER — Other Ambulatory Visit: Payer: Self-pay

## 2023-08-31 ENCOUNTER — Ambulatory Visit: Payer: 59 | Admitting: Dermatology

## 2023-08-31 ENCOUNTER — Encounter: Payer: Self-pay | Admitting: Dermatology

## 2023-08-31 ENCOUNTER — Other Ambulatory Visit (HOSPITAL_BASED_OUTPATIENT_CLINIC_OR_DEPARTMENT_OTHER): Payer: Self-pay

## 2023-08-31 VITALS — BP 175/76

## 2023-08-31 DIAGNOSIS — C44319 Basal cell carcinoma of skin of other parts of face: Secondary | ICD-10-CM | POA: Diagnosis not present

## 2023-08-31 DIAGNOSIS — L988 Other specified disorders of the skin and subcutaneous tissue: Secondary | ICD-10-CM | POA: Diagnosis not present

## 2023-08-31 DIAGNOSIS — Z85828 Personal history of other malignant neoplasm of skin: Secondary | ICD-10-CM | POA: Diagnosis not present

## 2023-08-31 DIAGNOSIS — C4491 Basal cell carcinoma of skin, unspecified: Secondary | ICD-10-CM

## 2023-08-31 DIAGNOSIS — Z5111 Encounter for antineoplastic chemotherapy: Secondary | ICD-10-CM

## 2023-08-31 DIAGNOSIS — W908XXA Exposure to other nonionizing radiation, initial encounter: Secondary | ICD-10-CM

## 2023-08-31 DIAGNOSIS — L570 Actinic keratosis: Secondary | ICD-10-CM

## 2023-08-31 DIAGNOSIS — L820 Inflamed seborrheic keratosis: Secondary | ICD-10-CM | POA: Diagnosis not present

## 2023-08-31 DIAGNOSIS — D485 Neoplasm of uncertain behavior of skin: Secondary | ICD-10-CM

## 2023-08-31 DIAGNOSIS — D492 Neoplasm of unspecified behavior of bone, soft tissue, and skin: Secondary | ICD-10-CM

## 2023-08-31 HISTORY — DX: Basal cell carcinoma of skin, unspecified: C44.91

## 2023-08-31 MED ORDER — CLOBETASOL PROPIONATE 0.05 % EX SOLN
1.0000 | Freq: Two times a day (BID) | CUTANEOUS | 1 refills | Status: DC
  Filled 2023-08-31 (×3): qty 50, 25d supply, fill #0
  Filled 2023-09-04 – 2023-11-01 (×3): qty 50, 25d supply, fill #1

## 2023-08-31 NOTE — Patient Instructions (Addendum)
 Apply Fluorouracil 5% cream twice daily to cheeks, nose and ears for 3 weeks.    Patient Handout: Wound Care for Skin Biopsy Site  Taking Care of Your Skin Biopsy Site  Proper care of the biopsy site is essential for promoting healing and minimizing scarring. This handout provides instructions on how to care for your biopsy site to ensure optimal recovery.  1. Cleaning the Wound:  Clean the biopsy site daily with gentle soap and water. Gently pat the area dry with a clean, soft towel. Avoid harsh scrubbing or rubbing the area, as this can irritate the skin and delay healing.  2. Applying Aquaphor and Bandage:  After cleaning the wound, apply a thin layer of Aquaphor ointment to the biopsy site. Cover the area with a sterile bandage to protect it from dirt, bacteria, and friction. Change the bandage daily or as needed if it becomes soiled or wet.  3. Continued Care for One Week:  Repeat the cleaning, Aquaphor application, and bandaging process daily for one week following the biopsy procedure. Keeping the wound clean and moist during this initial healing period will help prevent infection and promote optimal healing.  4. Massaging Aquaphor into the Area:  ---After one week, discontinue the use of bandages but continue to apply Aquaphor to the biopsy site. ----Gently massage the Aquaphor into the area using circular motions. ---Massaging the skin helps to promote circulation and prevent the formation of scar tissue.   Additional Tips:  Avoid exposing the biopsy site to direct sunlight during the healing process, as this can cause hyperpigmentation or worsen scarring. If you experience any signs of infection, such as increased redness, swelling, warmth, or drainage from the wound, contact your healthcare provider immediately. Follow any additional instructions provided by your healthcare provider for caring for the biopsy site and managing any discomfort. Conclusion:  Taking  proper care of your skin biopsy site is crucial for ensuring optimal healing and minimizing scarring. By following these instructions for cleaning, applying Aquaphor, and massaging the area, you can promote a smooth and successful recovery. If you have any questions or concerns about caring for your biopsy site, don't hesitate to contact your healthcare provider for guidance.     Important Information  Due to recent changes in healthcare laws, you may see results of your pathology and/or laboratory studies on MyChart before the doctors have had a chance to review them. We understand that in some cases there may be results that are confusing or concerning to you. Please understand that not all results are received at the same time and often the doctors may need to interpret multiple results in order to provide you with the best plan of care or course of treatment. Therefore, we ask that you please give [place] 2 business days to thoroughly review all your results before contacting the office for clarification. Should we see a critical lab result, you will be contacted sooner.   If You Need Anything After Your Visit  If you have any questions or concerns for your doctor, please call our main line at [phone] If no one answers, please leave a voicemail as directed and we will return your call as soon as possible. Messages left after 4 pm will be answered the following business day.   You may also send [place] a message via MyChart. We typically respond to MyChart messages within 1-2 business days.  For prescription refills, please ask your pharmacy to contact our office. Our fax number is [phone].  If you have an urgent issue when the clinic is closed that cannot wait until the next business day, you can page your doctor at the number below.    Please note that while we do our best to be available for urgent issues outside of office hours, we are not available 24/7.   If you have an urgent issue and  are unable to reach [place], you may choose to seek medical care at your doctor's office, retail clinic, urgent care center, or emergency room.  If you have a medical emergency, please immediately call 911 or go to the emergency department. In the event of inclement weather, please call our main line at [phone] for an update on the status of any delays or closures.  Dermatology Medication Tips: Please keep the boxes that topical medications come in in order to help keep track of the instructions about where and how to use these. Pharmacies typically print the medication instructions only on the boxes and not directly on the medication tubes.   If your medication is too expensive, please contact our office at [phone] or send [place] a message through MyChart.   We are unable to tell what your co-pay for medications will be in advance as this is different depending on your insurance coverage. However, we may be able to find a substitute medication at lower cost or fill out paperwork to get insurance to cover a needed medication.   If a prior authorization is required to get your medication covered by your insurance company, please allow [place] 1-2 business days to complete this process.  Drug prices often vary depending on where the prescription is filled and some pharmacies may offer cheaper prices.  The website www.goodrx.com contains coupons for medications through different pharmacies. The prices here do not account for what the cost may be with help from insurance (it may be cheaper with your insurance), but the website can give you the price if you did not use any insurance.  - You can print the associated coupon and take it with your prescription to the pharmacy.  - You may also stop by our office during regular business hours and pick up a GoodRx coupon card.  - If you need your prescription sent electronically to a different pharmacy, notify our office through [facility] or by phone at  [phone]

## 2023-08-31 NOTE — Progress Notes (Signed)
 New Patient Visit   Subjective  Keith [name]. is a 83 y.o. male who presents for the following: Scaly spots of face and scalp. He was seeing Dr Margo Aye in [place] and was being treated with LN2. His from [place] and has a history of BCC and SCC.  The following portions of the chart were reviewed this encounter and updated as appropriate: medications, allergies, medical history  Review of Systems:  No other skin or systemic complaints except as noted in HPI or Assessment and Plan.  Objective  Well appearing patient in no apparent distress; mood and affect are within normal limits.   A focused examination was performed of the following areas: Scalp, face  Relevant exam findings are noted in the Assessment and Plan.  Right Temple 1.2 cm pink scaly plaque with pigmented globules  Right Posterior Auricle 0.6 brown stuck on papule  Face Erythematous thin papules/macules with gritty scale.   Assessment & Plan   ACTINIC KERATOSIS Exam: Erythematous thin papules/macules with gritty scale at the nose, temples, ears  Actinic keratoses are precancerous spots that appear secondary to cumulative UV radiation exposure/sun exposure over time. They are chronic with expected duration over 1 year. A portion of actinic keratoses will progress to squamous cell carcinoma of the skin. It is not possible to reliably predict which spots will progress to skin cancer and so treatment is recommended to prevent development of skin cancer.  Recommend daily broad spectrum sunscreen SPF 30+ to sun-exposed areas, reapply every 2 hours as needed.  Recommend staying in the shade or wearing long sleeves, sun glasses (UVA+UVB protection) and wide brim hats (4-inch brim around the entire circumference of the hat). Call for new or changing lesions.  Treatment Plan: Start 5-fluorouracil cream twice a day for 21 days to affected areas including cheeks, nose, ears.  Reviewed course of treatment and expected  reaction.  Patient advised to expect inflammation and crusting and advised that erosions are possible.  Patient advised to be diligent with sun protection during and after treatment. Handout with details of how to apply medication and what to expect provided. Counseled to keep medication out of reach of children and pets.  Reviewed course of treatment and expected reaction.  Patient advised to expect inflammation and crusting and advised that erosions are possible.  Patient advised to be diligent with sun protection during and after treatment. Handout with details of how to apply medication and what to expect provided. Counseled to keep medication out of reach of children and pets.  NEOPLASM OF UNCERTAIN BEHAVIOR OF SKIN (2) Right Temple Skin / nail biopsy Type of biopsy: tangential   Informed consent: discussed and consent obtained   Timeout: patient name, date of birth, surgical site, and procedure verified   Procedure prep:  Patient was prepped and draped in usual sterile fashion Prep type:  Isopropyl alcohol Anesthesia: the lesion was anesthetized in a standard fashion   Anesthetic:  1% lidocaine w/ epinephrine 1-100,000 buffered w/ 8.4% NaHCO3 Instrument used: flexible razor blade   Hemostasis achieved with: pressure, aluminum chloride and electrodesiccation   Outcome: patient tolerated procedure well   Post-procedure details: sterile dressing applied and wound care instructions given   Dressing type: bandage and petrolatum   Specimen 1 - Surgical pathology Differential Diagnosis: NMSC vs other  Check Margins: No Right Posterior Auricle Skin / nail biopsy Type of biopsy: tangential   Informed consent: discussed and consent obtained   Timeout: patient name, date of birth, surgical site,  and procedure verified   Procedure prep:  Patient was prepped and draped in usual sterile fashion Prep type:  Isopropyl alcohol Anesthesia: the lesion was anesthetized in a standard fashion    Anesthetic:  1% lidocaine w/ epinephrine 1-100,000 buffered w/ 8.4% NaHCO3 Instrument used: DermaBlade   Hemostasis achieved with: pressure and aluminum chloride   Outcome: patient tolerated procedure well   Post-procedure details: sterile dressing applied and wound care instructions given   Dressing type: bandage and petrolatum gauze   AK (ACTINIC KERATOSIS) Face Start Fluorouracil 5% cream twice daily x 3 weeks to cheeks, nose and ears - patient has medication at home. EROSIVE PUSTULAR DERMATOSIS   Related Medications clobetasol (TEMOVATE) 0.05 % external solution Apply 1 Application topically 2 (two) times daily. 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 Clinical evaluation suggests an inflammatory process, potentially triggered by chronic irritation, trauma, or medication use. Differential diagnoses include infection, autoimmune blistering disease, or other inflammatory dermatoses. A biopsy and bacterial/fungal cultures may be considered for further evaluation. Treatment plan includes topical anti-inflammatory therapy, wound care, and addressing any potential underlying triggers. Close monitoring for progression or secondary infection is recommended. - clobetasol (TEMOVATE) 0.05 % external solution; Apply 1 Application topically 2 (two) times daily. Apply to scalp  Dispense: 50 mL; Refill: 1  HISTORY OF BASAL CELL CARCINOMA OF THE SKIN - No evidence of recurrence today - Recommend regular full body skin exams - Recommend daily broad spectrum sunscreen SPF 30+ to sun-exposed areas, reapply every 2 hours as needed.  - Call if any new or changing lesions are noted between office visits  HISTORY OF SQUAMOUS CELL CARCINOMA OF THE SKIN - No evidence of recurrence today - No  lymphadenopathy - Recommend regular full body skin exams - Recommend daily broad spectrum sunscreen SPF 30+ to sun-exposed areas, reapply every 2 hours as needed.  - Call if any new or changing lesions are noted between office visits  Return for 6-8 weeks , AK follow up.  I, Joanie Coddington, CMA, am acting as scribe for Gwenith Daily, MD .  Documentation: I have reviewed the above documentation for accuracy and completeness, and I agree with the above.  Gwenith Daily, MD

## 2023-09-01 LAB — SURGICAL PATHOLOGY

## 2023-09-01 NOTE — Addendum Note (Signed)
 Addended by: Gwenith Daily on: 09/01/2023 03:06 PM   Modules accepted: Orders

## 2023-09-05 ENCOUNTER — Other Ambulatory Visit (HOSPITAL_BASED_OUTPATIENT_CLINIC_OR_DEPARTMENT_OTHER): Payer: Self-pay

## 2023-09-05 ENCOUNTER — Telehealth: Payer: Self-pay | Admitting: Dermatology

## 2023-09-05 NOTE — Telephone Encounter (Signed)
-----   Message from [facility] sent at 09/01/2023  3:07 PM EST ----- Regarding: Site B change Hi Team,   Can we get the location of the path report changed from left postauricular to right postauricular?  Thank you!  KP

## 2023-09-05 NOTE — Telephone Encounter (Signed)
 Called and spoke to [facility] they are changing the path report and sending a new one.

## 2023-09-08 ENCOUNTER — Other Ambulatory Visit (HOSPITAL_BASED_OUTPATIENT_CLINIC_OR_DEPARTMENT_OTHER): Payer: Self-pay

## 2023-09-13 ENCOUNTER — Other Ambulatory Visit (HOSPITAL_COMMUNITY): Payer: Self-pay

## 2023-09-14 ENCOUNTER — Other Ambulatory Visit (HOSPITAL_COMMUNITY): Payer: Self-pay

## 2023-09-14 ENCOUNTER — Other Ambulatory Visit: Payer: Self-pay

## 2023-09-19 ENCOUNTER — Other Ambulatory Visit (HOSPITAL_COMMUNITY): Payer: Self-pay

## 2023-09-19 ENCOUNTER — Telehealth: Payer: Self-pay

## 2023-09-19 NOTE — Telephone Encounter (Signed)
-----   Message from [facility] sent at 09/01/2023  3:02 PM EST ----- 1. BCC- right temple- Mohs with me 2. ISK- right ear- Benign  Please call patient to discuss diagnosis and schedule for Mohs surgery.

## 2023-09-19 NOTE — Telephone Encounter (Signed)
 Advised patient of results and will schedule with Dr Caralyn Guile for Mohs/hd

## 2023-10-04 ENCOUNTER — Encounter: Admitting: Dermatology

## 2023-10-11 ENCOUNTER — Encounter: Payer: Self-pay | Admitting: Dermatology

## 2023-10-18 ENCOUNTER — Encounter: Payer: Self-pay | Admitting: Dermatology

## 2023-10-18 ENCOUNTER — Ambulatory Visit (INDEPENDENT_AMBULATORY_CARE_PROVIDER_SITE_OTHER): Admitting: Dermatology

## 2023-10-18 VITALS — BP 137/65 | HR 60 | Temp 97.9°F

## 2023-10-18 DIAGNOSIS — W908XXA Exposure to other nonionizing radiation, initial encounter: Secondary | ICD-10-CM

## 2023-10-18 DIAGNOSIS — L814 Other melanin hyperpigmentation: Secondary | ICD-10-CM | POA: Diagnosis not present

## 2023-10-18 DIAGNOSIS — L570 Actinic keratosis: Secondary | ICD-10-CM

## 2023-10-18 DIAGNOSIS — C4491 Basal cell carcinoma of skin, unspecified: Secondary | ICD-10-CM

## 2023-10-18 DIAGNOSIS — C44319 Basal cell carcinoma of skin of other parts of face: Secondary | ICD-10-CM | POA: Diagnosis not present

## 2023-10-18 DIAGNOSIS — L579 Skin changes due to chronic exposure to nonionizing radiation, unspecified: Secondary | ICD-10-CM | POA: Diagnosis not present

## 2023-10-18 NOTE — Patient Instructions (Signed)

## 2023-10-18 NOTE — Progress Notes (Signed)
 Follow-Up Visit   Subjective  Keith [name]. is a 83 y.o. male who presents for the following: Mohs of a Nodular and Superficial Basal Cell Carcinoma on the right temple, biopsied by Dr. Caralyn Guile.  The following portions of the chart were reviewed this encounter and updated as appropriate: medications, allergies, medical history  Review of Systems:  No other skin or systemic complaints except as noted in HPI or Assessment and Plan.  Objective  Well appearing patient in no apparent distress; mood and affect are within normal limits.  A focused examination was performed of the following areas: Right temple Relevant physical exam findings are noted in the Assessment and Plan.   Right Temple Pink pearly papule or plaque with arborizing vessels.    Assessment & Plan   BASAL CELL CARCINOMA (BCC), UNSPECIFIED SITE Right Temple Mohs surgery  Consent obtained: written  Anticoagulation: Is the patient taking prescription anticoagulant and/or aspirin prescribed/recommended by a physician? Yes   Was the anticoagulation regimen changed prior to Mohs? No    Procedure Details: Timeout: pre-procedure verification complete Procedure Prep: patient was prepped and draped in usual sterile fashion Prep type: chlorhexidine Biopsy accession number: [id number] Biopsy lab: [facility] Frozen section biopsy performed: No   Specimen debulked: No   Pre-Op diagnosis: basal cell carcinoma BCC subtype: nodular and superficial MohsAIQ Surgical site (if tumor spans multiple areas, please select predominant area): temple Surgery side: right Surgical site (from skin exam): Right Temple Pre-operative length (cm): 1.4 Pre-operative width (cm): 1.5 Indications for Mohs surgery: anatomic location where tissue conservation is critical Previously treated? No    Micrographic Surgery Details: Post-operative length (cm): 2.4 Post-operative width (cm): 2.6 Number of Mohs stages: 2 Is this a complex case  (associate members only): No    Stage 1    Tumor features identified on Mohs section: basal carcinoma    Depth of defect after stage: subcutaneous fat    Perineural invasion: no perineural invasion  Stage 2    Tumor features identified on Mohs section: no tumor identified    Depth of defect after stage: subcutaneous fat    Perineural invasion: no perineural invasion  Patient tolerance of procedure: tolerated well, no immediate complications  Reconstruction: Was the defect reconstructed? Yes   Was reconstruction performed by the same Mohs surgeon? Yes   Setting of reconstruction: outpatient office When was reconstruction performed? same day Type of reconstruction: flap Type of flap: advancement, rotation and transposition   Other advancement flap type: tripolar Flap area (cm2): 17  Opioids: Did the patient receive a prescription for opioid/narcotic related to Mohs surgery?: No    Antibiotics: Does patient meet AHA guidelines for endocarditis?: No   Does patient meet AHA guidelines for orthopedic prophylaxis?: No   Were antibiotics given on the day of surgery?: No   Did surgery breach mucosa, expose cartilage/bone, involve an area of lymphedema/inflamed/infected tissue? No   AK (ACTINIC KERATOSIS) (6) Mid Parietal Scalp (6) Destruction of lesion - Mid Parietal Scalp (6) Complexity: simple   Destruction method: cryotherapy   Informed consent: discussed and consent obtained   Timeout:  patient name, date of birth, surgical site, and procedure verified Lesion destroyed using liquid nitrogen: Yes   Region frozen until ice ball extended beyond lesion: Yes   Cryotherapy cycles:  2 Outcome: patient tolerated procedure well with no complications   Post-procedure details: wound care instructions given    ACTINIC KERATOSIS Exam: Erythematous thin papules/macules with gritty scale at the scalp and temples  Actinic keratoses are precancerous spots that appear secondary to cumulative  UV radiation exposure/sun exposure over time. They are chronic with expected duration over 1 year. A portion of actinic keratoses will progress to squamous cell carcinoma of the skin. It is not possible to reliably predict which spots will progress to skin cancer and so treatment is recommended to prevent development of skin cancer.  Recommend daily broad spectrum sunscreen SPF 30+ to sun-exposed areas, reapply every 2 hours as needed.  Recommend staying in the shade or wearing long sleeves, sun glasses (UVA+UVB protection) and wide brim hats (4-inch brim around the entire circumference of the hat). Call for new or changing lesions.  Treatment Plan: Cryotherapy as above  Return in about 10 days (around 10/28/2023) for suture removal.  I, Manual Meier, Surg Tech III, am acting as scribe for Gwenith Daily, MD.    10/18/2023  HISTORY OF PRESENT ILLNESS  Keith [name]. is seen in consultation at the request of Dr. Caralyn Guile for biopsy-proven Nodular and Superficial Basal Cell Carcinoma of the right temple. They note that the area has been present for about 6 months increasing in size with time.  There is no history of previous treatment.  Reports no other new or changing lesions and has no other complaints today.  Medications and allergies: see patient chart.  Review of systems: Reviewed 8 systems and notable for the above skin cancer.  All other systems reviewed are unremarkable/negative, unless noted in the HPI. Past medical history, surgical history, family history, social history were also reviewed and are noted in the chart/questionnaire.    PHYSICAL EXAMINATION  General: Well-appearing, in no acute distress, alert and oriented x 4. Vitals reviewed in chart (if available).   Skin: Exam reveals a 1.4 x 1.5 cm erythematous papule and biopsy scar on the right temple. There are rhytids, telangiectasias, and lentigines, consistent with photodamage.   Biopsy report(s) reviewed, confirming the  diagnosis.   ASSESSMENT  1) Superficial and Nodular Basal Cell Carcinoma of the right temple 2) photodamage 3) solar lentigines   PLAN   1. Due to location, size, histology, or recurrence and the likelihood of subclinical extension as well as the need to conserve normal surrounding tissue, the patient was deemed acceptable for Mohs micrographic surgery (MMS).  The nature and purpose of the procedure, associated benefits and risks including recurrence and scarring, possible complications such as pain, infection, and bleeding, and alternative methods of treatment if appropriate were discussed with the patient during consent. The lesion location was verified by the patient, by reviewing previous notes, pathology reports, and by photographs as well as angulation measurements if available.  Informed consent was reviewed and signed by the patient, and timeout was performed at 9:30 AM. See op note below.  2. For the photodamage and solar lentigines, sun protection discussed/information given on OTC sunscreens, and we recommend continued regular follow-up with primary dermatologist every 6 months or sooner for any growing, bleeding, or changing lesions. 3. Prognosis and future surveillance discussed. 4. Letter with treatment outcome sent to referring provider. 5. Pain acetaminophen/ibuprofen  MOHS MICROGRAPHIC SURGERY AND RECONSTRUCTION  Initial size:   1.4 x 1.5 cm Surgical defect/wound size: 2.4 x 2.6 cm Anesthesia:    0.33% lidocaine with 1:200,000 epinephrine EBL:    <5 mL Complications:  None Repair type:   Adjacent Tissue Transfer (Advancement Flap) SQ suture:   4-0 Vicryl Cutaneous suture:  5-0 Polyprolene Final size of the repair: 5.5 x 3.0 = 16.5  cm^2  Stages: 2  STAGE I: Anesthesia achieved with 0.5% lidocaine with 1:200,000 epinephrine. ChloraPrep applied. 1 section(s) excised using Mohs technique (this includes total peripheral and deep tissue margin excision and evaluation with  frozen sections, excised and interpreted by the same physician). The tumor was first debulked and then excised with an approx. 2 mm margin.  Hemostasis was achieved with electrocautery as needed.  The specimen was then oriented, subdivided/relaxed, inked, and processed using Mohs technique.    Frozen section analysis revealed a positive margin for  multiple, small buds of basaloid cells descending from the epidermis with no dermal invasion in the peripheral margin.    STAGE II: An additional 2 mm margin was excised.  Hemostasis was achieved with electrocautery as needed.  The specimen was then oriented, subdivided/relaxed, inked, and processed using Mohs technique. Evaluation of slides by the Mohs surgeon revealed clear tumor margins.  Reconstruction  PROCEDURE: Advancement Flap The nature of the procedure was discussed with the patient in detail, including alternatives.  The risks discussed included but not limited to potential for infection, bleeding, scar formation, and damage to underlying structures.  The patient understood the risks and signed the consent form (scanned into chart).  This wound was reconstructed with an advancement flap.  Local anesthesia was achieved with the anesthetic indicated above.  The operative site was prepped with a surgical antiseptic solution, and then draped with sterile towels to insure a sterile field.  The beveled edges of the wound were excised to 90 degrees relative to the surface skin plane.  The wound was undermined in all directions, and meticulous hemostasis was achieved with an electrosurgical device.  Relaxing incisions were made, if necessary, for lateral tension release.  The tissue was advanced and closed centrally, and redundant tissue was trimmed as necessary.  The wound was sutured in a layered fashion to close potential dead space and to precisely and securely approximate the wound edges.  The dimensions of the flap were:  5.5 cm x 3.0 cm for a total  flap surface area of 16.5 centimeters squared (cm2).    The wound was covered with petrolatum and a dressing.  The patient understands the need to return immediately for any signs of infection to include swelling, pain, purulent discharge, localized warmth, or fever.  Contact information was provided to the patient (including after-hours pager numbers)    Documentation: I have reviewed the above documentation for accuracy and completeness, and I agree with the above.  Gwenith Daily, MD

## 2023-10-19 ENCOUNTER — Ambulatory Visit: Payer: 59 | Admitting: Dermatology

## 2023-10-23 ENCOUNTER — Other Ambulatory Visit (HOSPITAL_COMMUNITY): Payer: Self-pay

## 2023-10-24 ENCOUNTER — Other Ambulatory Visit (HOSPITAL_COMMUNITY): Payer: Self-pay

## 2023-10-24 ENCOUNTER — Other Ambulatory Visit (HOSPITAL_BASED_OUTPATIENT_CLINIC_OR_DEPARTMENT_OTHER): Payer: Self-pay

## 2023-10-24 ENCOUNTER — Other Ambulatory Visit: Payer: Self-pay

## 2023-10-24 MED ORDER — TAMSULOSIN HCL 0.4 MG PO CAPS
0.4000 mg | ORAL_CAPSULE | Freq: Every day | ORAL | 3 refills | Status: AC
  Filled 2023-10-24 – 2024-05-07 (×2): qty 100, 100d supply, fill #0
  Filled 2024-05-09: qty 90, 90d supply, fill #0
  Filled 2024-08-01: qty 90, 90d supply, fill #1

## 2023-10-25 ENCOUNTER — Encounter: Payer: Self-pay | Admitting: Dermatology

## 2023-10-27 ENCOUNTER — Encounter: Payer: Self-pay | Admitting: Dermatology

## 2023-10-27 ENCOUNTER — Ambulatory Visit (INDEPENDENT_AMBULATORY_CARE_PROVIDER_SITE_OTHER): Admitting: Dermatology

## 2023-10-27 VITALS — BP 141/69 | HR 61

## 2023-10-27 DIAGNOSIS — Z48817 Encounter for surgical aftercare following surgery on the skin and subcutaneous tissue: Secondary | ICD-10-CM

## 2023-10-27 DIAGNOSIS — C4491 Basal cell carcinoma of skin, unspecified: Secondary | ICD-10-CM

## 2023-10-27 DIAGNOSIS — Z85828 Personal history of other malignant neoplasm of skin: Secondary | ICD-10-CM

## 2023-10-27 DIAGNOSIS — T1490XD Injury, unspecified, subsequent encounter: Secondary | ICD-10-CM

## 2023-10-27 NOTE — Progress Notes (Signed)
   Follow Up Visit   Subjective  Keith [name]. is a 83 y.o. male who presents for the following: follow up from Mohs surgery   The patient presents for follow up from Mohs surgery for a BCC on the right temple, treated on 10/18/23, repaired with tripolar advancement flap. The patient has been bandaging the wound as directed. The endorse the following concerns: Here to remove suture.   The following portions of the chart were reviewed this encounter and updated as appropriate: medications, allergies, medical history  Review of Systems:  No other skin or systemic complaints except as noted in HPI or Assessment and Plan.  Objective  Well appearing patient in no apparent distress; mood and affect are within normal limits.  A full examination was performed including scalp, head, and face. All findings within normal limits unless otherwise noted below.  Healing wound with mild erythema  Relevant physical exam findings are noted in the Assessment and Plan.     Assessment & Plan   Healing s/p Mohs for [facility], treated on right temple, repaired with advancement flap - Reassured that wound is healing well - Sutures removed - No evidence of infection - No swelling, induration, purulence, dehiscence, or tenderness out of proportion to the clinical exam, see photo above - Discussed that scars take up to 12 months to mature from the date of surgery - Recommend SPF 30+ to scar daily to prevent purple color from UV exposure during scar maturation process - Discussed that erythema and raised appearance of scar will fade over the next 4-6 months - Ok to continue ointment daily to wound under a bandage for another 2 weeks or until completely healed.  HISTORY OF BASAL CELL CARCINOMA OF THE SKIN - No evidence of recurrence today - Recommend regular full body skin exams - Recommend daily broad spectrum sunscreen SPF 30+ to sun-exposed areas, reapply every 2 hours as needed.  - Call if any new or  changing lesions are noted between office visits   No follow-ups on file.  I, Haig Levan, Surg Tech III, am acting as scribe for Deneise Finlay, MD.   Documentation: I have reviewed the above documentation for accuracy and completeness, and I agree with the above.  Deneise Finlay, MD

## 2023-11-02 ENCOUNTER — Other Ambulatory Visit: Payer: Self-pay

## 2023-11-02 ENCOUNTER — Other Ambulatory Visit (HOSPITAL_BASED_OUTPATIENT_CLINIC_OR_DEPARTMENT_OTHER): Payer: Self-pay

## 2023-11-10 ENCOUNTER — Ambulatory Visit: Admitting: Dermatology

## 2023-11-10 ENCOUNTER — Encounter: Payer: Self-pay | Admitting: Dermatology

## 2023-11-10 VITALS — BP 150/69 | HR 74

## 2023-11-10 DIAGNOSIS — W908XXA Exposure to other nonionizing radiation, initial encounter: Secondary | ICD-10-CM

## 2023-11-10 DIAGNOSIS — L570 Actinic keratosis: Secondary | ICD-10-CM

## 2023-11-10 DIAGNOSIS — Z85828 Personal history of other malignant neoplasm of skin: Secondary | ICD-10-CM

## 2023-11-10 DIAGNOSIS — T1490XD Injury, unspecified, subsequent encounter: Secondary | ICD-10-CM

## 2023-11-10 NOTE — Progress Notes (Signed)
 Follow Up Visit   Subjective  Keith Bou. is a 83 y.o. male who presents for the following: follow up from Mohs surgery   The patient presents for follow up from Mohs surgery for a BCC on the right temple, treated on 10/18/23, repaired with tripolar advancement flap. The patient has been bandaging the wound as directed. The endorse the following concerns: No questions or concerns at this time.  The following portions of the chart were reviewed this encounter and updated as appropriate: medications, allergies, medical history  Review of Systems:  No other skin or systemic complaints except as noted in HPI or Assessment and Plan.  Objective  Well appearing patient in no apparent distress; mood and affect are within normal limits.  A full examination was performed including scalp, head, and face. All findings within normal limits unless otherwise noted below.  Healing wound with mild erythema  Relevant physical exam findings are noted in the Assessment and Plan.  Right Temple Erythematous thin papules/macules with gritty scale.   Assessment & Plan   Healing s/p Mohs for [facility], treated on 10/18/23, repaired with tripolar advancement flap. - Reassured that wound is healing well - Mild dehiscence in center - No evidence of infection - No swelling, induration, purulence, or tenderness out of proportion to the clinical exam, see photo above - Discussed that scars take up to 12 months to mature from the date of surgery - Recommend SPF 30+ to scar daily to prevent purple color from UV exposure during scar maturation process - Discussed that erythema and raised appearance of scar will fade over the next 4-6 months - OK to start scar massage at 4-6 weeks post-op - Can consider silicone based products for scar healing starting at 6 weeks post-op - Ok to continue ointment daily to wound under a bandage for another 1-2 weeks or until completley healed.  HISTORY OF BASAL CELL CARCINOMA OF THE  SKIN - No evidence of recurrence today - Recommend regular full body skin exams - Recommend daily broad spectrum sunscreen SPF 30+ to sun-exposed areas, reapply every 2 hours as needed.  - Call if any new or changing lesions are noted between office visits  ACTINIC KERATOSIS Exam: Erythematous thin papules/macules with gritty scale  Actinic keratoses are precancerous spots that appear secondary to cumulative UV radiation exposure/sun exposure over time. They are chronic with expected duration over 1 year. A portion of actinic keratoses will progress to squamous cell carcinoma of the skin. It is not possible to reliably predict which spots will progress to skin cancer and so treatment is recommended to prevent development of skin cancer.  Recommend daily broad spectrum sunscreen SPF 30+ to sun-exposed areas, reapply every 2 hours as needed.  Recommend staying in the shade or wearing long sleeves, sun glasses (UVA+UVB protection) and wide brim hats (4-inch brim around the entire circumference of the hat). Call for new or changing lesions.  Treatment Plan:  Prior to procedure, discussed risks of blister formation, small wound, skin dyspigmentation, or rare scar following cryotherapy. Recommend Vaseline ointment to treated areas while healing.  Destruction Procedure Note Destruction method: cryotherapy   Informed consent: discussed and consent obtained   Lesion destroyed using liquid nitrogen: Yes   Outcome: patient tolerated procedure well with no complications   Post-procedure details: wound care instructions given   Locations: right temple # of Lesions Treated: 1  HISTORY OF BASAL CELL CARCINOMA (BCC) Right Temple AK (ACTINIC KERATOSIS) Right Temple Destruction of lesion -  Right Temple Complexity: simple   Destruction method: cryotherapy   Informed consent: discussed and consent obtained   Timeout:  patient name, date of birth, surgical site, and procedure verified Outcome:  patient tolerated procedure well with no complications   Post-procedure details: wound care instructions given    Return in about 2 weeks (around 11/24/2023).  I, Haig Levan, Surg Tech III, am acting as scribe for Deneise Finlay, MD.   Documentation: I have reviewed the above documentation for accuracy and completeness, and I agree with the above.  Deneise Finlay, MD

## 2023-11-10 NOTE — Patient Instructions (Signed)

## 2023-11-24 ENCOUNTER — Ambulatory Visit (INDEPENDENT_AMBULATORY_CARE_PROVIDER_SITE_OTHER): Admitting: Dermatology

## 2023-11-24 VITALS — BP 150/84

## 2023-11-24 DIAGNOSIS — Z85828 Personal history of other malignant neoplasm of skin: Secondary | ICD-10-CM

## 2023-11-24 DIAGNOSIS — T1490XD Injury, unspecified, subsequent encounter: Secondary | ICD-10-CM

## 2023-11-24 DIAGNOSIS — Z48817 Encounter for surgical aftercare following surgery on the skin and subcutaneous tissue: Secondary | ICD-10-CM

## 2023-11-24 NOTE — Progress Notes (Unsigned)
   Follow Up Visit   Subjective  Keith [name]. is a 83 y.o. male who presents for the following: follow up from Mohs surgery   The patient presents for follow up from Mohs surgery for a BCC on the right temple, treated on 10/18/2023, repaired with tripolar advancement flap. The patient has been bandaging the wound as directed. The endorse the following concerns: No concerns today  The following portions of the chart were reviewed this encounter and updated as appropriate: medications, allergies, medical history  Review of Systems:  No other skin or systemic complaints except as noted in HPI or Assessment and Plan.  Objective  Well appearing patient in no apparent distress; mood and affect are within normal limits.  A focused exam was performed including scalp, head, face. All findings within normal limits unless otherwise noted below.  Healing wound with mild erythema  Relevant physical exam findings are noted in the Assessment and Plan        Assessment & Plan    Healing s/p Mohs for [facility] of right temple, treated on 10/18/2023, repaired with tripolar advancement flap - Reassured that wound is healing well - No evidence of infection - No swelling, induration, purulence, dehiscence, or tenderness out of proportion to the clinical exam, see photo above - Discussed that scars take up to 12 months to mature from the date of surgery - Recommend SPF 30+ to scar daily to prevent purple color from UV exposure during scar maturation process - Discussed that erythema and raised appearance of scar will fade over the next 4-6 months - OK to start scar massage at 4-6 weeks post-op - Can consider silicone based products for scar healing starting at 6 weeks post-op - Ok to continue ointment daily to wound under a bandage for another week.  HISTORY OF BASAL CELL CARCINOMA OF THE SKIN - No evidence of recurrence today - Recommend regular full body skin exams - Recommend daily broad spectrum  sunscreen SPF 30+ to sun-exposed areas, reapply every 2 hours as needed.  - Call if any new or changing lesions are noted between office visits    Return in about 6 months (around 05/26/2024) for Follow up.  I, Eliot Guernsey, CMA, am acting as scribe for Deneise Finlay, MD .   Documentation: I have reviewed the above documentation for accuracy and completeness, and I agree with the above.  Deneise Finlay, MD

## 2023-11-24 NOTE — Patient Instructions (Signed)

## 2023-11-25 ENCOUNTER — Encounter: Payer: Self-pay | Admitting: Dermatology

## 2023-12-02 ENCOUNTER — Other Ambulatory Visit (HOSPITAL_BASED_OUTPATIENT_CLINIC_OR_DEPARTMENT_OTHER): Payer: Self-pay

## 2023-12-02 ENCOUNTER — Encounter (HOSPITAL_BASED_OUTPATIENT_CLINIC_OR_DEPARTMENT_OTHER): Payer: Self-pay | Admitting: Family Medicine

## 2023-12-02 ENCOUNTER — Ambulatory Visit (INDEPENDENT_AMBULATORY_CARE_PROVIDER_SITE_OTHER): Payer: 59 | Admitting: Family Medicine

## 2023-12-02 VITALS — BP 136/80 | HR 62 | Ht 71.0 in | Wt 193.9 lb

## 2023-12-02 DIAGNOSIS — R1031 Right lower quadrant pain: Secondary | ICD-10-CM

## 2023-12-02 DIAGNOSIS — E039 Hypothyroidism, unspecified: Secondary | ICD-10-CM

## 2023-12-02 DIAGNOSIS — G609 Hereditary and idiopathic neuropathy, unspecified: Secondary | ICD-10-CM

## 2023-12-02 DIAGNOSIS — R319 Hematuria, unspecified: Secondary | ICD-10-CM | POA: Diagnosis not present

## 2023-12-02 LAB — POCT URINALYSIS DIP (CLINITEK)
Bilirubin, UA: NEGATIVE
Glucose, UA: NEGATIVE mg/dL
Ketones, POC UA: NEGATIVE mg/dL
Leukocytes, UA: NEGATIVE
Nitrite, UA: NEGATIVE
POC PROTEIN,UA: NEGATIVE
Spec Grav, UA: 1.025 (ref 1.010–1.025)
Urobilinogen, UA: 0.2 U/dL
pH, UA: 6 (ref 5.0–8.0)

## 2023-12-02 MED ORDER — GABAPENTIN 100 MG PO CAPS
100.0000 mg | ORAL_CAPSULE | Freq: Two times a day (BID) | ORAL | 3 refills | Status: DC
  Filled 2023-12-02: qty 180, 90d supply, fill #0
  Filled 2024-03-09: qty 180, 90d supply, fill #1

## 2023-12-02 MED ORDER — LEVOTHYROXINE SODIUM 75 MCG PO TABS
75.0000 ug | ORAL_TABLET | Freq: Every day | ORAL | 3 refills | Status: AC
  Filled 2023-12-02: qty 90, 90d supply, fill #0
  Filled 2024-02-10 – 2024-02-13 (×2): qty 90, 90d supply, fill #1
  Filled 2024-05-26: qty 90, 90d supply, fill #2

## 2023-12-02 NOTE — Patient Instructions (Addendum)
 Call or schedule online for [facility] - Oct 2025

## 2023-12-02 NOTE — Progress Notes (Signed)
 Subjective:   Keith [name]. 05-Apr-1941 12/02/2023  Chief Complaint  Patient presents with   neuoropathic pain   Hypertension    HPI: Keith [name]. presents today for re-assessment and management of chronic medical conditions.  HYPERTENSION: Keith [name]. presents for the medical management of hypertension. Patient has hx of Afibb and managed by [facility] with [facility] with Dr. Mallipedi.  Patient's current hypertension medication regimen is:  Patient is  currently taking prescribed medications for HTN.  Patient is  regularly keeping a check on BP at home.  Adhering to low sodium diet: Yes Exercising Regularly: Yes Denies headache, dizziness, CP, SHOB, vision changes.   BP Readings from Last 3 Encounters:  12/02/23 136/80  11/24/23 (!) 150/84  11/10/23 (!) 150/69    GROIN PAIN:  Patient states he is having right sided groin pain for 6-8 weeks. He states he notices pain with coughing more. Rates pain 2/10. He has hx of abdominal wall hernia. He is not using pain medications, Denies dysuria, rectal pain, fever/chills.      The following portions of the patient's history were reviewed and updated as appropriate: past medical history, past surgical history, family history, social history, allergies, medications, and problem list.   Patient Active Problem List   Diagnosis Date Noted   Aneurysm of thoracic aorta (HCC) 08/04/2023   Aortic valve disorder 08/04/2023   Gastroesophageal reflux disease without esophagitis 08/04/2023   Actinic keratosis due to exposure to sunlight 08/04/2023   Permanent atrial fibrillation (HCC) 05/03/2023   Paroxysmal atrial flutter (HCC) 05/03/2023   History of stroke 05/03/2023   Chronic anticoagulation 05/03/2023   Idiopathic peripheral neuropathy 10/22/2022   Impaired fasting glucose 10/22/2022   Hypocapnia 04/07/2022   Arthritis of left knee    S/P TKR (total knee replacement), left 12/31/2021   Arthritis of left  shoulder region    S/P reverse total shoulder arthroplasty, left 09/01/2021   Bilateral carpal tunnel syndrome 07/20/2021   Chronic kidney disease, stage 3a (HCC) 07/19/2021   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 05/13/2021   Basal cell carcinoma of skin 05/13/2021   Chronic low back pain 05/13/2021   History of urinary stone 05/13/2021   Status post left hip replacement 12/19/2020   Primary osteoarthritis of left hip 12/04/2020   Hyperlipidemia 04/30/2020   Cataract 10/10/2019   Right ureteral stone 10/01/2019   Left ureteral stone 10/01/2019   S/P lumbar fusion 06/15/2019   Preop cardiovascular exam 09/12/2017   Acquired hypothyroidism 08/10/2017   Benign prostatic hyperplasia with nocturia 08/10/2017   S/P lumbar laminectomy 06/01/2017   PAF (paroxysmal atrial fibrillation) (HCC) 09/07/2016   S/P AVR 01/18/2014   Hypertension 02/22/2013   Past Medical History:  Diagnosis Date   Arthritis    Atrial fibrillation (HCC)    Cardioversion 2016   Atrial flutter (HCC)    Cardioversion 2013   Basal cell carcinoma 08/31/2023   Right temple - needs Mohs   Blood transfusion without reported diagnosis    Cancer (HCC)    Skin- Basil/Squamous cell   Cataract 2022   Clotting disorder (HCC)    On blood thinners   Dysrhythmia    A.fib, cardioversions x2   GERD (gastroesophageal reflux disease)    History of aortic valve replacement with bioprosthetic valve    Aortic regurgitation   History of chicken pox    History of kidney stones    History of skin cancer  History of stroke 2011   Hypertension    Hypothyroidism    Kidney stone    Neuromuscular disorder (HCC) 04/2023   Neuropathy of feet   Seasonal allergies    Stroke [facility])    after heart surgery 2010   Past Surgical History:  Procedure Laterality Date   AORTIC VALVE REPLACEMENT  2009   Bioprosthetic AVR and root replacement   BACK SURGERY     total of 4 back surgeries   BILATERAL CARPAL TUNNEL  RELEASE Bilateral    2022,2023   CARDIAC VALVE REPLACEMENT  2010   Aortic valve   CATARACT EXTRACTION, BILATERAL     COLONOSCOPY     CYSTOSCOPY WITH RETROGRADE PYELOGRAM, URETEROSCOPY AND STENT PLACEMENT Bilateral 10/01/2019   Procedure: CYSTOSCOPY WITH RETROGRADE PYELOGRAM, URETEROSCOPY AND STENT PLACEMENT;  Surgeon: Homero Luster, MD;  Location: [facility];  Service: Urology;  Laterality: Bilateral;   CYSTOSCOPY/URETEROSCOPY/HOLMIUM LASER/STENT PLACEMENT Left 05/10/2023   Procedure: CYSTOSCOPY LEFT RETROGRADE LEFT URETEROSCOPY/HOLMIUM LASER/STENT PLACEMENT;  Surgeon: Homero Luster, MD;  Location: [facility];  Service: Urology;  Laterality: Left;  1 HR FOR CASE   EPIGASTRIC HERNIA REPAIR     HERNIA REPAIR     JOINT REPLACEMENT  2023   Knee, Hip, shoulder   LUMBAR LAMINECTOMY/DECOMPRESSION MICRODISCECTOMY Right 06/01/2017   Procedure: EXTRAFORAMINAL MICRODISCECTOMY LUMBAR FIVE- SACRAL ONE, RIGHT;  Surgeon: Isadora Mar, MD;  [facility] OR;  Service: Neurosurgery;  Laterality: Right;   LUMBAR LAMINECTOMY/DECOMPRESSION MICRODISCECTOMY Bilateral 09/04/2018   Procedure: Laminectomy and Foraminotomy - Lumbar two-three bilateral;  Surgeon: Isadora Mar, MD;  Location: [facility] OR;  Service: Neurosurgery;  Laterality: Bilateral;   REVERSE SHOULDER ARTHROPLASTY Left 09/01/2021   Procedure: LEFT REVERSE SHOULDER ARTHROPLASTY;  Surgeon: Jasmine Mesi, MD;  Location: [facility] OR;  Service: Orthopedics;  Laterality: Left;   SPINE SURGERY  2020   Lumbar laminectomy   TOTAL HIP ARTHROPLASTY Left 12/19/2020   Procedure: LEFT TOTAL HIP ARTHROPLASTY ANTERIOR APPROACH;  Surgeon: Arnie Lao, MD;  Location: [facility];  Service: Orthopedics;  Laterality: Left;   TOTAL KNEE ARTHROPLASTY Left 12/31/2021   Procedure: TOTAL KNEE ARTHROPLASTY;  Surgeon: Jasmine Mesi, MD;  Location: [facility] OR;  Service: Orthopedics;  Laterality: Left;   TRANSFORAMINAL LUMBAR INTERBODY FUSION (TLIF) WITH PEDICLE SCREW FIXATION 1  LEVEL Left 06/15/2019   Procedure: Decompression and Instrumention Fusion Lumbar Four- Five;  Surgeon: Isadora Mar, MD;  Location: [facility] OR;  Service: Neurosurgery;  Laterality: Left;  Decompression and Instrumention Fusion Lumbar Four- Five   Family History  Problem Relation Age of Onset   Colon cancer Neg Hx    Esophageal cancer Neg Hx    Rectal cancer Neg Hx    Stomach cancer Neg Hx    Outpatient Medications Prior to Visit  Medication Sig Dispense Refill   acetaminophen  (TYLENOL ) 500 MG tablet Take 1,000 mg by mouth every 6 (six) hours as needed for moderate pain.     apixaban  (ELIQUIS ) 5 MG TABS tablet Take 1 tablet (5 mg total) by mouth 2 (two) times daily. 180 tablet 3   clobetasol  (TEMOVATE ) 0.05 % external solution Apply 1 Application topically 2 (two) times daily. Apply to scalp 50 mL 1   finasteride  (PROSCAR ) 5 MG tablet Take 1 tablet (5 mg total) by mouth daily. 90 tablet 3   fluorouracil  (EFUDEX ) 5 % cream Apply 1 Application topically daily as needed. 40 g PRN   lisinopril -hydrochlorothiazide  (ZESTORETIC ) 10-12.5 MG tablet Take 1 tablet by mouth daily. 90 tablet  3   pantoprazole  (PROTONIX ) 40 MG tablet Take 1 tablet (40 mg total) by mouth daily. 90 tablet 3   tamsulosin  (FLOMAX ) 0.4 MG CAPS capsule Take 1 capsule (0.4 mg total) by mouth daily. 100 capsule 3   tamsulosin  (FLOMAX ) 0.4 MG CAPS capsule Take 1 capsule (0.4 mg total) by mouth daily. 100 capsule 3   gabapentin  (NEURONTIN ) 100 MG capsule Take 1 capsule (100 mg total) by mouth 3 (three) times daily as needed (neuropathic pain). 90 capsule 3   levothyroxine  (EUTHYROX ) 75 MCG tablet Take 1 tablet (75 mcg total) by mouth daily. 90 tablet 3   No facility-administered medications prior to visit.   Allergies  Allergen Reactions   Tape Rash    Tears skin      ROS: A complete ROS was performed with pertinent positives/negatives noted in the HPI. The remainder of the ROS are negative.    Objective:   Today's Vitals    12/02/23 0929 12/02/23 1010  BP: (!) 152/74 136/80  Pulse: 62   SpO2: 99%   Weight: 193 lb 14.4 oz (88 kg)   Height: 5\' 11"  (1.803 m)   PainSc: 2    PainLoc: Groin     Physical Exam          GENERAL: Well-appearing, in NAD. Well nourished.  SKIN: Pink, warm and dry. No rash, lesion, ulceration, or ecchymoses.  Head: Normocephalic. NECK: Trachea midline. Full ROM w/o pain or tenderness.  RESPIRATORY: Chest wall symmetrical. Respirations even and non-labored. Breath sounds clear to auscultation bilaterally.  CARDIAC: S1, S2 present, regular rate and rhythm without murmur or gallops. Peripheral pulses 2+ bilaterally.  MSK: Muscle tone and strength appropriate for age. Joints w/o tenderness, redness, or swelling.  GU: No scrotal swelling or discoloration. Testes descended bilaterally. Epididymis non-tender. No palpable masses.  Mildly palpable right inguinal bulge with valsalva and coughing maneuver.  GI: Abdomen soft, non-tender. Normoactive bowel sounds. No rebound tenderness. No hepatomegaly or splenomegaly. No CVA tenderness.  Chaperoned by : Therisa Flatten, BSN, RN, DNP student EXTREMITIES: Without clubbing, cyanosis, or edema.  NEUROLOGIC: No motor or sensory deficits. Steady, even gait. C2-C12 intact.  PSYCH/MENTAL STATUS: Alert, oriented x 3. Cooperative, appropriate mood and affect.   Health Maintenance Due  Topic Date Due   Zoster Vaccines- Shingrix (1 of 2) Never done   Medicare Annual Wellness (AWV)  02/25/2021   COVID-19 Vaccine (4 - 2024-25 season) 03/13/2023    Results for orders placed or performed in visit on 12/02/23  POCT URINALYSIS DIP (CLINITEK)  Result Value Ref Range   Color, UA yellow yellow   Clarity, UA clear clear   Glucose, UA negative negative mg/dL   Bilirubin, UA negative negative   Ketones, POC UA negative negative mg/dL   Spec Grav, UA 8.295 6.213 - 1.025   Blood, UA small (A) negative   pH, UA 6.0 5.0 - 8.0   POC PROTEIN,UA negative negative,  trace   Urobilinogen, UA 0.2 0.2 or 1.0 E.U./dL   Nitrite, UA Negative Negative   Leukocytes, UA Negative Negative    The ASCVD Risk score (Arnett DK, et al., 2019) failed to calculate for the following reasons:   The 2019 ASCVD risk score is only valid for ages 63 to 3   Risk score cannot be calculated because patient has a medical history suggesting prior/existing ASCVD     Assessment & Plan:  1. Idiopathic peripheral neuropathy Controlled per patient, needed refill, refill sent.  - gabapentin  (NEURONTIN ) 100  MG capsule; Take 1 capsule (100 mg total) by mouth 2 (two) times daily.  Dispense: 180 capsule; Refill: 3  2. Acquired hypothyroidism Controlled per pt, needed refill, refill sent. TSH checked in January 2025. Will check with next appt.  - levothyroxine  (EUTHYROX ) 75 MCG tablet; Take 1 tablet (75 mcg total) by mouth daily.  Dispense: 90 tablet; Refill: 3  3. Right inguinal pain (Primary) Mildly palpable bulge to right inguinal canal, will obtain US  pelvis to rule out inguinal hernia. Discussed prevention of inguinal hernia and management with pt, as well as complications and adverse effects. Mild blood present in urine. Will send for microscopic for confirmation.  - US  Pelvis Complete; Future - POCT URINALYSIS DIP (CLINITEK)   Meds ordered this encounter  Medications   gabapentin  (NEURONTIN ) 100 MG capsule    Sig: Take 1 capsule (100 mg total) by mouth 2 (two) times daily.    Dispense:  180 capsule    Refill:  3    Supervising Provider:   DE [place], RAYMOND J [[id number]]   levothyroxine  (EUTHYROX ) 75 MCG tablet    Sig: Take 1 tablet (75 mcg total) by mouth daily.    Dispense:  90 tablet    Refill:  3    Supervising Provider:   DE [place], RAYMOND J [[id number]]   Lab Orders         Urinalysis, Routine w reflex microscopic         POCT URINALYSIS DIP (CLINITEK)      Return in about 4 months (around 04/03/2024) for ANNUAL PHYSICAL, HYPERTENSION, hyperlipidemia (fasting labs at  visit) .    Patient to reach out to office if new, worrisome, or unresolved symptoms arise or if no improvement in patient's condition. Patient verbalized understanding and is agreeable to treatment plan. All questions answered to patient's satisfaction.    Nonda Bays, [place]

## 2023-12-03 LAB — URINALYSIS, ROUTINE W REFLEX MICROSCOPIC
Bilirubin, UA: NEGATIVE
Glucose, UA: NEGATIVE
Ketones, UA: NEGATIVE
Leukocytes,UA: NEGATIVE
Nitrite, UA: NEGATIVE
Protein,UA: NEGATIVE
Specific Gravity, UA: 1.024 (ref 1.005–1.030)
Urobilinogen, Ur: 0.2 mg/dL (ref 0.2–1.0)
pH, UA: 6 (ref 5.0–7.5)

## 2023-12-03 LAB — MICROSCOPIC EXAMINATION
Bacteria, UA: NONE SEEN
Casts: NONE SEEN /LPF
Epithelial Cells (non renal): NONE SEEN /HPF (ref 0–10)

## 2023-12-06 ENCOUNTER — Ambulatory Visit (HOSPITAL_BASED_OUTPATIENT_CLINIC_OR_DEPARTMENT_OTHER): Payer: Self-pay | Admitting: Family Medicine

## 2023-12-06 ENCOUNTER — Ambulatory Visit (HOSPITAL_BASED_OUTPATIENT_CLINIC_OR_DEPARTMENT_OTHER)
Admission: RE | Admit: 2023-12-06 | Discharge: 2023-12-06 | Disposition: A | Discharge status: Home / Self Care | Source: Ambulatory Visit | Attending: Family Medicine | Admitting: Family Medicine

## 2023-12-06 DIAGNOSIS — R102 Pelvic and perineal pain: Secondary | ICD-10-CM | POA: Diagnosis not present

## 2023-12-06 DIAGNOSIS — R1031 Right lower quadrant pain: Secondary | ICD-10-CM | POA: Insufficient documentation

## 2023-12-06 NOTE — Progress Notes (Signed)
 Hi Don,  Your urine did show a bit of blood present in the sample. Do you have a hx of any blood in your urine previously? If not, I would recommend we obtain an evaluation with Urology. Please let me know.

## 2023-12-14 ENCOUNTER — Encounter (HOSPITAL_BASED_OUTPATIENT_CLINIC_OR_DEPARTMENT_OTHER): Payer: Self-pay | Admitting: *Deleted

## 2023-12-14 ENCOUNTER — Encounter: Payer: Self-pay | Admitting: Dermatology

## 2023-12-14 NOTE — Telephone Encounter (Signed)
 Records release request has been sent to [facility]. Not able to see pt's upcoming appt with [facility] so called the office. Pt has a lab appt with [facility] on 6/23 and then he has an office visit with provider there 6/30.

## 2023-12-15 NOTE — Progress Notes (Signed)
 Hi Keith [name],  Your ultrasound was negative for a hernia. There was a small lymph node slightly enlarged to the right groin area. This can be due to inflammation, viral or bacterial illnesses. If you are not having symptoms of painful urination, abdominal pain, fever/chills, weight loss, fatigue, I would recommend we recheck your ultrasound in 6 weeks. If you are having symptoms, please let me know and we will investigate further now.

## 2024-01-02 DIAGNOSIS — R972 Elevated prostate specific antigen [PSA]: Secondary | ICD-10-CM | POA: Diagnosis not present

## 2024-01-09 ENCOUNTER — Other Ambulatory Visit (HOSPITAL_COMMUNITY): Payer: Self-pay

## 2024-01-09 DIAGNOSIS — R3912 Poor urinary stream: Secondary | ICD-10-CM | POA: Diagnosis not present

## 2024-01-09 DIAGNOSIS — N401 Enlarged prostate with lower urinary tract symptoms: Secondary | ICD-10-CM | POA: Diagnosis not present

## 2024-01-09 DIAGNOSIS — N200 Calculus of kidney: Secondary | ICD-10-CM | POA: Diagnosis not present

## 2024-01-09 DIAGNOSIS — R310 Gross hematuria: Secondary | ICD-10-CM | POA: Diagnosis not present

## 2024-01-09 MED ORDER — FINASTERIDE 5 MG PO TABS
5.0000 mg | ORAL_TABLET | Freq: Every day | ORAL | 3 refills | Status: AC
  Filled 2024-01-09: qty 90, 90d supply, fill #0
  Filled 2024-08-01: qty 90, 90d supply, fill #1

## 2024-01-31 ENCOUNTER — Encounter (HOSPITAL_BASED_OUTPATIENT_CLINIC_OR_DEPARTMENT_OTHER): Payer: Self-pay | Admitting: Family Medicine

## 2024-02-10 ENCOUNTER — Other Ambulatory Visit: Payer: Self-pay

## 2024-02-10 ENCOUNTER — Other Ambulatory Visit (HOSPITAL_BASED_OUTPATIENT_CLINIC_OR_DEPARTMENT_OTHER): Payer: Self-pay

## 2024-02-28 ENCOUNTER — Ambulatory Visit (HOSPITAL_BASED_OUTPATIENT_CLINIC_OR_DEPARTMENT_OTHER): Admitting: Family Medicine

## 2024-03-09 ENCOUNTER — Other Ambulatory Visit (HOSPITAL_BASED_OUTPATIENT_CLINIC_OR_DEPARTMENT_OTHER): Payer: Self-pay

## 2024-04-06 ENCOUNTER — Other Ambulatory Visit (HOSPITAL_BASED_OUTPATIENT_CLINIC_OR_DEPARTMENT_OTHER): Payer: Self-pay

## 2024-04-06 ENCOUNTER — Ambulatory Visit (INDEPENDENT_AMBULATORY_CARE_PROVIDER_SITE_OTHER): Admitting: Family Medicine

## 2024-04-06 ENCOUNTER — Encounter (HOSPITAL_BASED_OUTPATIENT_CLINIC_OR_DEPARTMENT_OTHER): Payer: Self-pay | Admitting: Family Medicine

## 2024-04-06 VITALS — BP 145/62 | HR 95 | Ht 71.0 in | Wt 189.5 lb

## 2024-04-06 DIAGNOSIS — N1831 Chronic kidney disease, stage 3a: Secondary | ICD-10-CM

## 2024-04-06 DIAGNOSIS — G609 Hereditary and idiopathic neuropathy, unspecified: Secondary | ICD-10-CM

## 2024-04-06 DIAGNOSIS — K4090 Unilateral inguinal hernia, without obstruction or gangrene, not specified as recurrent: Secondary | ICD-10-CM | POA: Insufficient documentation

## 2024-04-06 DIAGNOSIS — Z0000 Encounter for general adult medical examination without abnormal findings: Secondary | ICD-10-CM

## 2024-04-06 DIAGNOSIS — E782 Mixed hyperlipidemia: Secondary | ICD-10-CM

## 2024-04-06 DIAGNOSIS — E039 Hypothyroidism, unspecified: Secondary | ICD-10-CM | POA: Diagnosis not present

## 2024-04-06 MED ORDER — PREGABALIN 50 MG PO CAPS
50.0000 mg | ORAL_CAPSULE | Freq: Every evening | ORAL | 2 refills | Status: DC | PRN
  Filled 2024-04-06: qty 30, 30d supply, fill #0
  Filled 2024-04-28 – 2024-05-04 (×2): qty 30, 30d supply, fill #1
  Filled 2024-05-26 – 2024-06-02 (×2): qty 30, 30d supply, fill #2

## 2024-04-06 NOTE — Patient Instructions (Addendum)
 Schedule yearly appt with Cardiology in November.   Obtain Shingrix at pharmacy and consider Prevnar 20 or Capvaxive for pneumonia vaccine.

## 2024-04-06 NOTE — Progress Notes (Signed)
 Subjective:   Keith [name]. July 18, 1940 04/06/2024  CC: Chief Complaint  Patient presents with   Annual Exam    Patient is here today for his physical. States the hernia is getting worse.    HPI: Keith [name]. is a 83 y.o. male who presents for a routine health maintenance exam.  Labs collected at time of visit.   INGUINAL PAIN:  Patient reports worsening right sided inguinal pain and visible bulging to right inguinal area in the past several weeks. Patient did complete US  for concern of right inguinal hernia in May with lymph node present in right inguinal region, but no presence of inguinal hernia. He was recommended to repeat study in 4 weeks but did not reach out to PCP to follow up. He reports worsening bulge and pain with lifting, bending over, and coughing. Denies dysuria, constipation, diarrhea or bowel changes. Denies change in urination. Patient is followed by [facility] for BPH and has appt in November. Recent appt in June 2025 reviewed by PCP.   HEALTH SCREENINGS: - Vision Screening: UTD; hx of cataract surgery - Dental Visits: Recommended - Testicular Exam: Declined - STD Screening: Declined - PSA (50+): Followed by Urology; has appt in November with [facility]    Lab Results  Component Value Date   PSA 3.16 08/14/2019     - Colonoscopy (45+): Not applicable  Discussed with patient purpose of the colonoscopy is to detect colon cancer at curable precancerous or early stages  - AAA Screening: Not applicable  Men age 84-75 who have ever smoked - Lung Cancer screening with low-dose CT: Not applicable-  Adults age 24-80 who are current cigarette smokers or quit within the last 15 years. Must have 20 pack year history.   Depression and Anxiety Screen done today and results listed below:     04/06/2024    8:49 AM 08/04/2023    8:40 AM 02/26/2020   10:27 AM 08/14/2019   10:06 AM 08/10/2017    1:21 PM  Depression screen PHQ 2/9  Decreased  Interest 0 0 0 0 0  Down, Depressed, Hopeless 0 0 0 0 0  PHQ - 2 Score 0 0 0 0 0  Altered sleeping 0 0     Tired, decreased energy 0 0     Change in appetite 0 0     Feeling bad or failure about yourself  0 0     Trouble concentrating 0 0     Moving slowly or fidgety/restless 0 0     Suicidal thoughts 0 0     PHQ-9 Score 0 0     Difficult doing work/chores Not difficult at all Not difficult at all         04/06/2024    8:49 AM 08/04/2023    8:40 AM  GAD 7 : Generalized Anxiety Score  Nervous, Anxious, on Edge 0 0  Control/stop worrying 0 0  Worry too much - different things 0 0  Trouble relaxing 0 0  Restless 0 0  Easily annoyed or irritable 0 0  Afraid - awful might happen 0 0  Total GAD 7 Score 0 0  Anxiety Difficulty Not difficult at all Not difficult at all    IMMUNIZATIONS:  - Tdap: Tetanus vaccination status reviewed: Declined. - Influenza: Administered today - Pneumovax: Declined - Prevnar: Refused - Shingrix vaccine (50+): Refused   Past medical history, surgical history, medications, allergies, family history and social history reviewed with patient today  and changes made to appropriate areas of the chart.   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/2023   Right temple - needs Mohs   Blood transfusion without reported diagnosis    Cancer (HCC)    Skin- Basil/Squamous cell   Cataract 2022   Clotting disorder    On blood thinners   Dysrhythmia    A.fib, cardioversions x2   GERD (gastroesophageal reflux disease)    History of aortic valve replacement with bioprosthetic valve    Aortic regurgitation   History of chicken pox    History of kidney stones    History of skin cancer    History of stroke 2011   Hypertension    Hypothyroidism    Kidney stone    Neuromuscular disorder (HCC) 04/2023   Neuropathy of feet    Seasonal allergies    Stroke [facility])    after heart surgery 2010    Past Surgical History:  Procedure Laterality Date   AORTIC VALVE REPLACEMENT  2009   Bioprosthetic AVR and root replacement   BACK SURGERY     total of 4 back surgeries   BILATERAL CARPAL TUNNEL RELEASE Bilateral    2022,2023   CARDIAC VALVE REPLACEMENT  2010   Aortic valve   CATARACT EXTRACTION, BILATERAL     COLONOSCOPY     CYSTOSCOPY WITH RETROGRADE PYELOGRAM, URETEROSCOPY AND STENT PLACEMENT Bilateral 10/01/2019   Procedure: CYSTOSCOPY WITH RETROGRADE PYELOGRAM, URETEROSCOPY AND STENT PLACEMENT;  Surgeon: Watt Rush, MD;  Location: [facility];  Service: Urology;  Laterality: Bilateral;   CYSTOSCOPY/URETEROSCOPY/HOLMIUM LASER/STENT PLACEMENT Left 05/10/2023   Procedure: CYSTOSCOPY LEFT RETROGRADE LEFT URETEROSCOPY/HOLMIUM LASER/STENT PLACEMENT;  Surgeon: Watt Rush, MD;  Location: [facility];  Service: Urology;  Laterality: Left;  1 HR FOR CASE   EPIGASTRIC HERNIA REPAIR     HERNIA REPAIR     JOINT REPLACEMENT  2023   Knee, Hip, shoulder   LUMBAR LAMINECTOMY/DECOMPRESSION MICRODISCECTOMY Right 06/01/2017   Procedure: EXTRAFORAMINAL MICRODISCECTOMY LUMBAR FIVE- SACRAL ONE, RIGHT;  Surgeon: Joshua Alm RAMAN, MD;  Location: [facility] OR;  Service: Neurosurgery;  Laterality: Right;   LUMBAR LAMINECTOMY/DECOMPRESSION MICRODISCECTOMY Bilateral 09/04/2018   Procedure: Laminectomy and Foraminotomy - Lumbar two-three bilateral;  Surgeon: Joshua Alm RAMAN, MD;  Location: [facility] OR;  Service: Neurosurgery;  Laterality: Bilateral;   REVERSE SHOULDER ARTHROPLASTY Left 09/01/2021   Procedure: LEFT REVERSE SHOULDER ARTHROPLASTY;  Surgeon: Addie Cordella Hamilton, MD;  Location: [facility] OR;  Service: Orthopedics;  Laterality: Left;   SPINE SURGERY  2020   Lumbar laminectomy   TOTAL HIP ARTHROPLASTY Left 12/19/2020   Procedure: LEFT TOTAL HIP ARTHROPLASTY ANTERIOR APPROACH;  Surgeon: Vernetta Lonni GRADE, MD;  Location: [facility];  Service: Orthopedics;   Laterality: Left;   TOTAL KNEE ARTHROPLASTY Left 12/31/2021   Procedure: TOTAL KNEE ARTHROPLASTY;  Surgeon: Addie Cordella Hamilton, MD;  Location: [facility] OR;  Service: Orthopedics;  Laterality: Left;   TRANSFORAMINAL LUMBAR INTERBODY FUSION (TLIF) WITH PEDICLE SCREW FIXATION 1 LEVEL Left 06/15/2019   Procedure: Decompression and Instrumention Fusion Lumbar Four- Five;  Surgeon: Joshua Alm RAMAN, MD;  Location: [facility] OR;  Service: Neurosurgery;  Laterality: Left;  Decompression and Instrumention Fusion Lumbar Four- Five    Current Outpatient Medications on File Prior to Visit  Medication Sig   acetaminophen  (TYLENOL ) 500 MG tablet Take 1,000 mg by mouth every 6 (  six) hours as needed for moderate pain.   apixaban  (ELIQUIS ) 5 MG TABS tablet Take 1 tablet (5 mg total) by mouth 2 (two) times daily.   clobetasol  (TEMOVATE ) 0.05 % external solution Apply 1 Application topically 2 (two) times daily. Apply to scalp   finasteride  (PROSCAR ) 5 MG tablet Take 1 tablet (5 mg total) by mouth daily.   finasteride  (PROSCAR ) 5 MG tablet Take 1 tablet (5 mg total) by mouth daily.   fluorouracil  (EFUDEX ) 5 % cream Apply 1 Application topically daily as needed.   levothyroxine  (EUTHYROX ) 75 MCG tablet Take 1 tablet (75 mcg total) by mouth daily.   lisinopril -hydrochlorothiazide  (ZESTORETIC ) 10-12.5 MG tablet Take 1 tablet by mouth daily.   pantoprazole  (PROTONIX ) 40 MG tablet Take 1 tablet (40 mg total) by mouth daily.   tamsulosin  (FLOMAX ) 0.4 MG CAPS capsule Take 1 capsule (0.4 mg total) by mouth daily.   tamsulosin  (FLOMAX ) 0.4 MG CAPS capsule Take 1 capsule (0.4 mg total) by mouth daily.   No current facility-administered medications on file prior to visit.    Allergies  Allergen Reactions   Tape Rash    Tears skin      Social History   Socioeconomic History   Marital status: Married    Spouse name: Niels   Number of children: 1   Years of education: Not on file   Highest education level: 12th grade   Occupational History   Occupation: Surveyor, minerals  Tobacco Use   Smoking status: Never    Passive exposure: Past   Smokeless tobacco: Never  Vaping Use   Vaping status: Never Used  Substance and Sexual Activity   Alcohol use: Not Currently    Comment: occasional   Drug use: No   Sexual activity: Yes    Birth control/protection: Surgical    Comment: Vasectomy  Other Topics Concern   Not on file  Social History Narrative   Not on file   Social Drivers of Health   Financial Resource Strain: Low Risk  (04/04/2024)   Overall Financial Resource Strain (CARDIA)    Difficulty of Paying Living Expenses: Not hard at all  Food Insecurity: No Food Insecurity (04/04/2024)   Hunger Vital Sign    Worried About Running Out of Food in the Last Year: Never true    Ran Out of Food in the Last Year: Never true  Transportation Needs: No Transportation Needs (04/04/2024)   PRAPARE - Administrator, Civil Service (Medical): No    Lack of Transportation (Non-Medical): No  Physical Activity: Inactive (04/04/2024)   Exercise Vital Sign    Days of Exercise per Week: 0 days    Minutes of Exercise per Session: Not on file  Stress: No Stress Concern Present (04/04/2024)   [employer] of Occupational Health - Occupational Stress Questionnaire    Feeling of Stress: Only a little  Social Connections: Socially Integrated (04/04/2024)   Social Connection and Isolation Panel    Frequency of Communication with Friends and Family: Three times a week    Frequency of Social Gatherings with Friends and Family: More than three times a week    Attends Religious Services: 1 to 4 times per year    Active Member of [employer] or Organizations: Yes    Attends Banker Meetings: 1 to 4 times per year    Marital Status: Married  Catering manager Violence: Not At Risk (08/04/2023)   Humiliation, Afraid, Rape, and Kick questionnaire    Fear of Current  or Ex-Partner: No    Emotionally Abused: No     Physically Abused: No    Sexually Abused: No   Social History   Tobacco Use  Smoking Status Never   Passive exposure: Past  Smokeless Tobacco Never   Social History   Substance and Sexual Activity  Alcohol Use Not Currently   Comment: occasional     Family History  Problem Relation Age of Onset   Colon cancer Neg Hx    Esophageal cancer Neg Hx    Rectal cancer Neg Hx    Stomach cancer Neg Hx      ROS: Denies fever, fatigue, unexplained weight loss/gain, CP, SHOB, and palpatitations. Denies neurological deficits, gastrointestinal and/or genitourinary complaints, and skin changes.   Objective:   Today's Vitals   04/06/24 0846  BP: (!) 168/62  Pulse: 95  SpO2: 99%  Weight: 189 lb 8 oz (86 kg)  Height: 5' 11 (1.803 m)    GENERAL APPEARANCE: Well-appearing, in NAD. Well nourished.  SKIN: Pink, warm and dry. Turgor normal. No rash, lesion, ulceration, or ecchymoses. Hair evenly distributed.  HEENT: HEAD: Normocephalic.  EYES: PERRLA. EOMI. Lids intact w/o defect. Sclera white, Conjunctiva pink w/o exudate.  EARS: External ear w/o redness, swelling, masses or lesions. EAC clear. TM's intact, translucent w/o bulging, appropriate landmarks visualized. Appropriate acuity to conversational tones.  NOSE: Septum midline w/o deformity. Nares patent, mucosa pink and non-inflamed w/o drainage. No sinus tenderness.  THROAT: Uvula midline. Oropharynx clear. Tonsils non-inflamed w/o exudate. Oral mucosa pink and moist.  NECK: Supple, Trachea midline. Full ROM w/o pain or tenderness. No lymphadenopathy. Thyroid  non-tender w/o enlargement or palpable masses.  RESPIRATORY: Chest wall symmetrical w/o masses. Respirations even and non-labored. Breath sounds clear to auscultation bilaterally. No wheezes, rales, rhonchi, or crackles. CARDIAC: S1, S2 present, regular rate and rhythm. No gallops, murmurs, rubs, or clicks. PMI w/o lifts, heaves, or thrills. No carotid bruits. Capillary refill  <2 seconds. Peripheral pulses 2+ bilaterally. GI: Abdomen soft w/o distention. Normoactive bowel sounds. No palpable masses or tenderness. No guarding or rebound tenderness. Liver and spleen w/o tenderness or enlargement. No CVA tenderness. + Palpable soft tissue mass present to right inguinal area. + bulging with valsalva and cough. Non tender to palpation.  GU: Pt deferred exam. MSK: Muscle tone and strength appropriate for age, w/o atrophy or abnormal movement. EXTREMITIES: Active ROM intact, w/o tenderness, crepitus, or contracture. No obvious joint deformities or effusions. No clubbing, edema, or cyanosis.  NEUROLOGIC: CN's II-XII intact. Motor strength symmetrical with no obvious weakness. No sensory deficits. DTR 2+ symmetric bilaterally. Steady, even gait.  PSYCH/MENTAL STATUS: Alert, oriented x 3. Cooperative, appropriate mood and affect.   Results for orders placed or performed in visit on 12/02/23  POCT URINALYSIS DIP (CLINITEK)   Collection Time: 12/02/23 10:37 AM  Result Value Ref Range   Color, UA yellow yellow   Clarity, UA clear clear   Glucose, UA negative negative mg/dL   Bilirubin, UA negative negative   Ketones, POC UA negative negative mg/dL   Spec Grav, UA 8.974 8.989 - 1.025   Blood, UA small (A) negative   pH, UA 6.0 5.0 - 8.0   POC PROTEIN,UA negative negative, trace   Urobilinogen, UA 0.2 0.2 or 1.0 E.U./dL   Nitrite, UA Negative Negative   Leukocytes, UA Negative Negative  Microscopic Examination   Collection Time: 12/02/23 10:51 AM  Result Value Ref Range   WBC, UA 0-5 0 - 5 /hpf   RBC,  Urine 11-30 (A) 0 - 2 /hpf   Epithelial Cells (non renal) None seen 0 - 10 /hpf   Casts None seen None seen /lpf   Bacteria, UA None seen None seen/Few  Urinalysis, Routine w reflex microscopic   Collection Time: 12/02/23 10:51 AM  Result Value Ref Range   Specific Gravity, UA 1.024 1.005 - 1.030   pH, UA 6.0 5.0 - 7.5   Color, UA Yellow Yellow   Appearance Ur Clear  Clear   Leukocytes,UA Negative Negative   Protein,UA Negative Negative/Trace   Glucose, UA Negative Negative   Ketones, UA Negative Negative   RBC, UA Trace (A) Negative   Bilirubin, UA Negative Negative   Urobilinogen, Ur 0.2 0.2 - 1.0 mg/dL   Nitrite, UA Negative Negative   Microscopic Examination See below:     Assessment & Plan:  1. Annual physical exam (Primary) Discussed preventative screenings, vaccines, and healthy lifestyle with patient. Recommend patient complete Flu, Shingrix, and Prevnar 20 or Capvaxive for pneumonia prevention. Will obtain labs fasting today. He is very active and in great physical shape for age.  - CBC with Differential/Platelet  2. Inguinal hernia of right side without obstruction or gangrene Will obtain STAT US  of inguinal area for concern of right inguinal hernia now present upon exam versus lymphadenopathy. Will place referral to Gen Surg for evaluation as well post US  results.  - US  SOFT TISSUE LOWER EXTREMITY LIMITED RIGHT (NON-VASCULAR); Future - Ambulatory referral to General Surgery  3. Idiopathic peripheral neuropathy Discussed ongoing neuropathy likely due to prior back surgeries and injuries. Pt reported gabapentin  did not provide relief. Recommend trial of Lyrica . Will start with 50mg  PRN at bedtime. Safe use and side effects reviewed with patient and he verbalized understanding. Discussed non pharmocological methods for neuropathy as well. Will trial Lyrica  and notify PCP if no improvement.  - pregabalin  (LYRICA ) 50 MG capsule; Take 1 capsule (50 mg total) by mouth at bedtime as needed.  Dispense: 30 capsule; Refill: 2  4. Acquired hypothyroidism Stable. Will check TSH and titrate medication as needed.  - TSH  5. Mixed hyperlipidemia Triglycerides previously elevated. Recommended dietary changes and will check LP with fasting labs today.  - Lipid panel  6. Chronic kidney disease, stage 3a (HCC) Stable previously. Will check CMP with  fasting labs today. Patient does well with hydration and diet management.  - Comprehensive metabolic panel with GFR   Orders Placed This Encounter  Procedures   US  SOFT TISSUE LOWER EXTREMITY LIMITED RIGHT (NON-VASCULAR)    Standing Status:   Future    Expiration Date:   04/06/2025    Reason for Exam (SYMPTOM  OR DIAGNOSIS REQUIRED):   Palpable right inguinal nodule. concern for inguinal hernia with pain.    Preferred imaging location?:   [facility]    Call Results- Best Contact Number?:   [phone]   CBC with Differential/Platelet   Comprehensive metabolic panel with GFR   Lipid panel   TSH   Ambulatory referral to General Surgery    Referral Priority:   Routine    Referral Type:   Surgical    Referral Reason:   Specialty Services Required    Requested Specialty:   General Surgery    Number of Visits Requested:   1    PATIENT COUNSELING: - Encouraged to adjust caloric intake to maintain or achieve ideal body weight, to reduce intake of dietary saturated fat and total fat, to limit sodium intake by avoiding high sodium foods  and not adding table salt, and to maintain adequate dietary potassium and calcium preferably from fresh fruits, vegetables, and low-fat dairy products.   - Advised to avoid cigarette smoking. - Discussed with the patient that most people either abstain from alcohol or drink within safe limits (<=14/week and <=4 drinks/occasion for males, <=7/weeks and <= 3 drinks/occasion for females) and that the risk for alcohol disorders and other health effects rises proportionally with the number of drinks per week and how often a drinker exceeds daily limits. - Discussed cessation/primary prevention of drug use and availability of treatment for abuse.   - Stressed the importance of regular exercise - Injury prevention: Discussed safety belts, safety helmets, smoke detector, smoking near bedding or upholstery.  - Dental health: Discussed importance of regular tooth  brushing, flossing, and dental visits.  - Sexuality: Discussed sexually transmitted diseases, partner selection, use of condoms, avoidance of unintended pregnancy  and contraceptive alternatives.   NEXT PREVENTATIVE PHYSICAL DUE IN 1 YEAR.  Return in about 6 months (around 10/04/2024) for Follow up Chronic Conditions .  Patient to reach out to office if new, worrisome, or unresolved symptoms arise or if no improvement in patient's condition. Patient verbalized understanding and is agreeable to treatment plan. All questions answered to patient's satisfaction.    Thersia Schuyler Stark, [place]

## 2024-04-07 LAB — COMPREHENSIVE METABOLIC PANEL WITH GFR
ALT: 20 IU/L (ref 0–44)
AST: 20 IU/L (ref 0–40)
Albumin: 4.1 g/dL (ref 3.7–4.7)
Alkaline Phosphatase: 91 IU/L (ref 48–129)
BUN/Creatinine Ratio: 20 (ref 10–24)
BUN: 28 mg/dL — ABNORMAL HIGH (ref 8–27)
Bilirubin Total: 0.5 mg/dL (ref 0.0–1.2)
CO2: 23 mmol/L (ref 20–29)
Calcium: 9 mg/dL (ref 8.6–10.2)
Chloride: 104 mmol/L (ref 96–106)
Creatinine, Ser: 1.37 mg/dL — ABNORMAL HIGH (ref 0.76–1.27)
Globulin, Total: 2.5 g/dL (ref 1.5–4.5)
Glucose: 94 mg/dL (ref 70–99)
Potassium: 4.4 mmol/L (ref 3.5–5.2)
Sodium: 142 mmol/L (ref 134–144)
Total Protein: 6.6 g/dL (ref 6.0–8.5)
eGFR: 51 mL/min/1.73 — ABNORMAL LOW (ref >59)

## 2024-04-07 LAB — CBC WITH DIFFERENTIAL/PLATELET
Basophils Absolute: 0 x10E3/uL (ref 0.0–0.2)
Basos: 0 %
EOS (ABSOLUTE): 0.2 x10E3/uL (ref 0.0–0.4)
Eos: 3 %
Hematocrit: 38.7 % (ref 37.5–51.0)
Hemoglobin: 12.7 g/dL — ABNORMAL LOW (ref 13.0–17.7)
Immature Grans (Abs): 0.1 x10E3/uL (ref 0.0–0.1)
Immature Granulocytes: 1 %
Lymphocytes Absolute: 1.2 x10E3/uL (ref 0.7–3.1)
Lymphs: 20 %
MCH: 30.5 pg (ref 26.6–33.0)
MCHC: 32.8 g/dL (ref 31.5–35.7)
MCV: 93 fL (ref 79–97)
Monocytes Absolute: 0.6 x10E3/uL (ref 0.1–0.9)
Monocytes: 9 %
Neutrophils Absolute: 4.1 x10E3/uL (ref 1.4–7.0)
Neutrophils: 67 %
Platelets: 154 x10E3/uL (ref 150–450)
RBC: 4.16 x10E6/uL (ref 4.14–5.80)
RDW: 13.2 % (ref 11.6–15.4)
WBC: 6.1 x10E3/uL (ref 3.4–10.8)

## 2024-04-07 LAB — LIPID PANEL
Chol/HDL Ratio: 4.7 ratio (ref 0.0–5.0)
Cholesterol, Total: 173 mg/dL (ref 100–199)
HDL: 37 mg/dL — ABNORMAL LOW (ref >39)
LDL Chol Calc (NIH): 110 mg/dL — ABNORMAL HIGH (ref 0–99)
Triglycerides: 148 mg/dL (ref 0–149)
VLDL Cholesterol Cal: 26 mg/dL (ref 5–40)

## 2024-04-07 LAB — TSH: TSH: 1.66 u[IU]/mL (ref 0.450–4.500)

## 2024-04-09 ENCOUNTER — Encounter (HOSPITAL_BASED_OUTPATIENT_CLINIC_OR_DEPARTMENT_OTHER): Payer: Self-pay

## 2024-04-10 ENCOUNTER — Ambulatory Visit (HOSPITAL_BASED_OUTPATIENT_CLINIC_OR_DEPARTMENT_OTHER): Payer: Self-pay | Admitting: Family Medicine

## 2024-04-10 ENCOUNTER — Ambulatory Visit (INDEPENDENT_AMBULATORY_CARE_PROVIDER_SITE_OTHER): Admitting: *Deleted

## 2024-04-10 VITALS — Ht 71.0 in | Wt 189.0 lb

## 2024-04-10 DIAGNOSIS — Z0000 Encounter for general adult medical examination without abnormal findings: Secondary | ICD-10-CM

## 2024-04-10 NOTE — Progress Notes (Signed)
 Subjective:   Keith Staron. is a 83 y.o. male who presents for Medicare Annual/Subsequent preventive examination.  Visit Complete: Virtual I connected with  Keith [name]. on 04/10/24 by a audio enabled telemedicine application and verified that I am speaking with the correct person using two identifiers.  Patient Location: Home  Provider Location: Home Office  I discussed the limitations of evaluation and management by telemedicine. The patient expressed understanding and agreed to proceed.  Vital Signs: Because this visit was a virtual/telehealth visit, some criteria may be missing or patient reported. Any vitals not documented were not able to be obtained and vitals that have been documented are patient reported.  Patient Medicare AWV questionnaire was completed by the patient on 04-07-2024; I have confirmed that all information answered by patient is correct and no changes since this date.  Cardiac Risk Factors include: advanced age (>75men, >68 women);male gender;hypertension;obesity (BMI >30kg/m2)     Objective:    Today's Vitals   04/10/24 1135  Weight: 189 lb (85.7 kg)  Height: 5' 11 (1.803 m)   Body mass index is 26.36 kg/m.     04/10/2024   11:35 AM 05/10/2023    5:58 AM 04/27/2023   10:03 AM 01/19/2022    9:07 AM 12/31/2021    5:58 AM 12/23/2021    2:34 PM 10/08/2021   10:07 AM  Advanced Directives  Does Patient Have a Medical Advance Directive? No No Yes No No No No  Does patient want to make changes to medical advance directive?  No - Patient declined No - Patient declined No - Patient declined No - Patient declined Yes (MAU/Ambulatory/Procedural Areas - Information given)   Would patient like information on creating a medical advance directive? No - Patient declined No - Patient declined  No - Patient declined No - Patient declined Yes (MAU/Ambulatory/Procedural Areas - Information given)     Current Medications (verified) Outpatient Encounter  Medications as of 04/10/2024  Medication Sig   acetaminophen  (TYLENOL ) 500 MG tablet Take 1,000 mg by mouth every 6 (six) hours as needed for moderate pain.   apixaban  (ELIQUIS ) 5 MG TABS tablet Take 1 tablet (5 mg total) by mouth 2 (two) times daily.   clobetasol  (TEMOVATE ) 0.05 % external solution Apply 1 Application topically 2 (two) times daily. Apply to scalp   finasteride  (PROSCAR ) 5 MG tablet Take 1 tablet (5 mg total) by mouth daily.   finasteride  (PROSCAR ) 5 MG tablet Take 1 tablet (5 mg total) by mouth daily.   fluorouracil  (EFUDEX ) 5 % cream Apply 1 Application topically daily as needed.   levothyroxine  (EUTHYROX ) 75 MCG tablet Take 1 tablet (75 mcg total) by mouth daily.   lisinopril -hydrochlorothiazide  (ZESTORETIC ) 10-12.5 MG tablet Take 1 tablet by mouth daily.   pantoprazole  (PROTONIX ) 40 MG tablet Take 1 tablet (40 mg total) by mouth daily.   pregabalin  (LYRICA ) 50 MG capsule Take 1 capsule (50 mg total) by mouth at bedtime as needed.   tamsulosin  (FLOMAX ) 0.4 MG CAPS capsule Take 1 capsule (0.4 mg total) by mouth daily.   No facility-administered encounter medications on file as of 04/10/2024.    Allergies (verified) Tape   History: Past Medical History:  Diagnosis Date   Arthritis    Atrial fibrillation [facility])    Cardioversion 2016   Atrial flutter [facility])    Cardioversion 2013   Basal cell carcinoma 08/31/2023   Right temple - needs Mohs   Blood transfusion without reported diagnosis  Cancer (HCC)    Skin- Basil/Squamous cell   Cataract 2022   Clotting disorder    On blood thinners   Dysrhythmia    A.fib, cardioversions x2   GERD (gastroesophageal reflux disease)    History of aortic valve replacement with bioprosthetic valve    Aortic regurgitation   History of chicken pox    History of kidney stones    History of skin cancer    History of stroke 2011   Hypertension    Hypothyroidism    Kidney stone    Neuromuscular disorder (HCC) 04/2023   Neuropathy  of feet   Seasonal allergies    Stroke [facility])    after heart surgery 2010   Past Surgical History:  Procedure Laterality Date   AORTIC VALVE REPLACEMENT  2009   Bioprosthetic AVR and root replacement   BACK SURGERY     total of 4 back surgeries   BILATERAL CARPAL TUNNEL RELEASE Bilateral    2022,2023   CARDIAC VALVE REPLACEMENT  2010   Aortic valve   CATARACT EXTRACTION, BILATERAL     COLONOSCOPY     CYSTOSCOPY WITH RETROGRADE PYELOGRAM, URETEROSCOPY AND STENT PLACEMENT Bilateral 10/01/2019   Procedure: CYSTOSCOPY WITH RETROGRADE PYELOGRAM, URETEROSCOPY AND STENT PLACEMENT;  Surgeon: Watt Rush, MD;  Location: [facility];  Service: Urology;  Laterality: Bilateral;   CYSTOSCOPY/URETEROSCOPY/HOLMIUM LASER/STENT PLACEMENT Left 05/10/2023   Procedure: CYSTOSCOPY LEFT RETROGRADE LEFT URETEROSCOPY/HOLMIUM LASER/STENT PLACEMENT;  Surgeon: Watt Rush, MD;  Location: [facility];  Service: Urology;  Laterality: Left;  1 HR FOR CASE   EPIGASTRIC HERNIA REPAIR     HERNIA REPAIR     JOINT REPLACEMENT  2023   Knee, Hip, shoulder   LUMBAR LAMINECTOMY/DECOMPRESSION MICRODISCECTOMY Right 06/01/2017   Procedure: EXTRAFORAMINAL MICRODISCECTOMY LUMBAR FIVE- SACRAL ONE, RIGHT;  Surgeon: Joshua Alm RAMAN, MD;  Location: [facility] OR;  Service: Neurosurgery;  Laterality: Right;   LUMBAR LAMINECTOMY/DECOMPRESSION MICRODISCECTOMY Bilateral 09/04/2018   Procedure: Laminectomy and Foraminotomy - Lumbar two-three bilateral;  Surgeon: Joshua Alm RAMAN, MD;  Location: [facility] OR;  Service: Neurosurgery;  Laterality: Bilateral;   REVERSE SHOULDER ARTHROPLASTY Left 09/01/2021   Procedure: LEFT REVERSE SHOULDER ARTHROPLASTY;  Surgeon: Addie Cordella Hamilton, MD;  Location: [facility] OR;  Service: Orthopedics;  Laterality: Left;   SPINE SURGERY  2020   Lumbar laminectomy   TOTAL HIP ARTHROPLASTY Left 12/19/2020   Procedure: LEFT TOTAL HIP ARTHROPLASTY ANTERIOR APPROACH;  Surgeon: Vernetta Lonni GRADE, MD;  Location: [facility];  Service:  Orthopedics;  Laterality: Left;   TOTAL KNEE ARTHROPLASTY Left 12/31/2021   Procedure: TOTAL KNEE ARTHROPLASTY;  Surgeon: Addie Cordella Hamilton, MD;  Location: [facility] OR;  Service: Orthopedics;  Laterality: Left;   TRANSFORAMINAL LUMBAR INTERBODY FUSION (TLIF) WITH PEDICLE SCREW FIXATION 1 LEVEL Left 06/15/2019   Procedure: Decompression and Instrumention Fusion Lumbar Four- Five;  Surgeon: Joshua Alm RAMAN, MD;  Location: [facility] OR;  Service: Neurosurgery;  Laterality: Left;  Decompression and Instrumention Fusion Lumbar Four- Five   Family History  Problem Relation Age of Onset   Colon cancer Neg Hx    Esophageal cancer Neg Hx    Rectal cancer Neg Hx    Stomach cancer Neg Hx    Social History   Socioeconomic History   Marital status: Married    Spouse name: Niels   Number of children: 1   Years of education: Not on file   Highest education level: 12th grade  Occupational History   Occupation: Surveyor, minerals  Tobacco Use   Smoking status: Never  Passive exposure: Past   Smokeless tobacco: Never  Vaping Use   Vaping status: Never Used  Substance and Sexual Activity   Alcohol use: Not Currently    Comment: occasional   Drug use: No   Sexual activity: Yes    Birth control/protection: Surgical    Comment: Vasectomy  Other Topics Concern   Not on file  Social History Narrative   Not on file   Social Drivers of Health   Financial Resource Strain: Low Risk  (04/10/2024)   Overall Financial Resource Strain (CARDIA)    Difficulty of Paying Living Expenses: Not hard at all  Food Insecurity: No Food Insecurity (04/10/2024)   Hunger Vital Sign    Worried About Running Out of Food in the Last Year: Never true    Ran Out of Food in the Last Year: Never true  Transportation Needs: No Transportation Needs (04/10/2024)   PRAPARE - Administrator, Civil Service (Medical): No    Lack of Transportation (Non-Medical): No  Physical Activity: Inactive (04/10/2024)   Exercise Vital Sign     Days of Exercise per Week: 0 days    Minutes of Exercise per Session: 0 min  Stress: No Stress Concern Present (04/10/2024)   [employer] of Occupational Health - Occupational Stress Questionnaire    Feeling of Stress: Only a little  Social Connections: Socially Integrated (04/10/2024)   Social Connection and Isolation Panel    Frequency of Communication with Friends and Family: Three times a week    Frequency of Social Gatherings with Friends and Family: More than three times a week    Attends Religious Services: 1 to 4 times per year    Active Member of [employer] or Organizations: Yes    Attends Banker Meetings: 1 to 4 times per year    Marital Status: Married    Tobacco Counseling Counseling given: Not Answered   Clinical Intake:  Pre-visit preparation completed: Yes  Pain : No/denies pain     Diabetes: No  How often do you need to have someone help you when you read instructions, pamphlets, or other written materials from your doctor or pharmacy?: 1 - Never  Interpreter Needed?: No  Information entered by :: Mliss Graff LPN   Activities of Daily Living    04/10/2024   11:39 AM 04/07/2024    8:37 AM  In your present state of health, do you have any difficulty performing the following activities:  Hearing? 0 0  Vision? 0 0  Difficulty concentrating or making decisions? 0 0  Walking or climbing stairs? 0 0  Dressing or bathing? 0 0  Doing errands, shopping? 0 0  Preparing Food and eating ? N N  Using the Toilet? N N  In the past six months, have you accidently leaked urine? N N  Do you have problems with loss of bowel control? N N  Managing your Medications? N N  Managing your Finances? N N  Housekeeping or managing your Housekeeping? N N    Patient Care Team: Caudle, Thersia Bitters, FNP as PCP - General (Family Medicine) Mallipeddi, Diannah SQUIBB, MD as PCP - Cardiology (Cardiology)  Indicate any recent Medical Services you may have received from  other than [facility] providers in the past year (date may be approximate).     Assessment:   This is a routine wellness examination for Chastin.  Hearing/Vision screen Hearing Screening - Comments:: No trouble hearing Vision Screening - Comments:: Up to date Unsure  of name   Goals Addressed             This Visit's Progress    Patient Stated   On track    Increase physical activity & eat healthier     Patient Stated       Continue working       Depression Screen    04/10/2024   11:40 AM 04/06/2024    8:49 AM 08/04/2023    8:40 AM 02/26/2020   10:27 AM 08/14/2019   10:06 AM 08/10/2017    1:21 PM  PHQ 2/9 Scores  PHQ - 2 Score 0 0 0 0 0 0  PHQ- 9 Score 0 0 0       Fall Risk    04/10/2024   11:32 AM 04/07/2024    8:37 AM 04/06/2024    8:49 AM 08/04/2023    8:40 AM 02/26/2020   10:26 AM  Fall Risk   Falls in the past year? 0 0 0 0 0  Number falls in past yr: 0  0 0 0  Injury with Fall? 0 0 0 0 0  Risk for fall due to :   No Fall Risks No Fall Risks   Follow up Falls evaluation completed;Education provided;Falls prevention discussed  Falls evaluation completed Falls evaluation completed Falls prevention discussed      Data saved with a previous flowsheet row definition    MEDICARE RISK AT HOME: Medicare Risk at Home Any stairs in or around the home?: Yes If so, are there any without handrails?: No Home free of loose throw rugs in walkways, pet beds, electrical cords, etc?: Yes Adequate lighting in your home to reduce risk of falls?: Yes Life alert?: No Use of a cane, walker or w/c?: No Grab bars in the bathroom?: Yes Shower chair or bench in shower?: No Elevated toilet seat or a handicapped toilet?: Yes  TIMED UP AND GO:  Was the test performed?  No    Cognitive Function:        04/10/2024   11:34 AM  6CIT Screen  What Year? 0 points  What month? 0 points  What time? 0 points  Count back from 20 2 points  Months in reverse 2 points  Repeat phrase 2 points   Total Score 6 points    Immunizations Immunization History  Administered Date(s) Administered   Fluad Quad(high Dose 65+) 05/28/2020   INFLUENZA, HIGH DOSE SEASONAL PF 06/06/2018, 07/20/2021, 05/05/2022   Influenza Split 04/11/2013, 06/30/2016, 05/10/2017   Influenza,inj,Quad PF,6+ Mos 06/13/2019   Moderna Sars-Covid-2 Vaccination 08/23/2019, 09/20/2019   PFIZER(Purple Top)SARS-COV-2 Vaccination 07/03/2020   Pneumococcal Conjugate-13 09/09/2017   Pneumococcal Polysaccharide-23 08/14/2019    TDAP status: Due, Education has been provided regarding the importance of this vaccine. Advised may receive this vaccine at local pharmacy or Health Dept. Aware to provide a copy of the vaccination record if obtained from local pharmacy or Health Dept. Verbalized acceptance and understanding.  Flu Vaccine status: Due, Education has been provided regarding the importance of this vaccine. Advised may receive this vaccine at local pharmacy or Health Dept. Aware to provide a copy of the vaccination record if obtained from local pharmacy or Health Dept. Verbalized acceptance and understanding.  Pneumococcal vaccine status: Due, Education has been provided regarding the importance of this vaccine. Advised may receive this vaccine at local pharmacy or Health Dept. Aware to provide a copy of the vaccination record if obtained from local pharmacy or Health Dept. Verbalized acceptance and  understanding.  Covid-19 vaccine status: Declined, Education has been provided regarding the importance of this vaccine but patient still declined. Advised may receive this vaccine at local pharmacy or Health Dept.or vaccine clinic. Aware to provide a copy of the vaccination record if obtained from local pharmacy or Health Dept. Verbalized acceptance and understanding.  Qualifies for Shingles Vaccine? Yes   Zostavax completed No   Shingrix Completed?: No.    Education has been provided regarding the importance of this vaccine.  Patient has been advised to call insurance company to determine out of pocket expense if they have not yet received this vaccine. Advised may also receive vaccine at local pharmacy or Health Dept. Verbalized acceptance and understanding.  Screening Tests Health Maintenance  Topic Date Due   Zoster Vaccines- Shingrix (1 of 2) Never done   COVID-19 Vaccine (4 - 2025-26 season) 03/12/2024   Influenza Vaccine  10/09/2024 (Originally 02/10/2024)   Medicare Annual Wellness (AWV)  04/10/2025   Pneumococcal Vaccine: 50+ Years  Completed   HPV VACCINES  Aged Out   Meningococcal B Vaccine  Aged Out   DTaP/Tdap/Td  Discontinued    Health Maintenance  Health Maintenance Due  Topic Date Due   Zoster Vaccines- Shingrix (1 of 2) Never done   COVID-19 Vaccine (4 - 2025-26 season) 03/12/2024    Colorectal cancer screening: No longer required.   Lung Cancer Screening: (Low Dose CT Chest recommended if Age 6-80 years, 20 pack-year currently smoking OR have quit w/in 15years.) does not qualify.   Lung Cancer Screening Referral:   Additional Screening:  Hepatitis C Screening: does not qualify;  Vision Screening: Recommended annual ophthalmology exams for early detection of glaucoma and other disorders of the eye. Is the patient up to date with their annual eye exam?  Yes  Who is the provider or what is the name of the office in which the patient attends annual eye exams? Unsure of name If pt is not established with a provider, would they like to be referred to a provider to establish care? No .   Dental Screening: Recommended annual dental exams for proper oral hygiene    Community Resource Referral / Chronic Care Management: CRR required this visit?  No   CCM required this visit?  No     Plan:     I have personally reviewed and noted the following in the patient's chart:   Medical and social history Use of alcohol, tobacco or illicit drugs  Current medications and supplements  including opioid prescriptions. Patient is not currently taking opioid prescriptions. Functional ability and status Nutritional status Physical activity Advanced directives List of other physicians Hospitalizations, surgeries, and ER visits in previous 12 months Vitals Screenings to include cognitive, depression, and falls Referrals and appointments  In addition, I have reviewed and discussed with patient certain preventive protocols, quality metrics, and best practice recommendations. A written personalized care plan for preventive services as well as general preventive health recommendations were provided to patient.     Mliss Graff, LPN   [id number]   After Visit Summary: (MyChart) Due to this being a telephonic visit, the after visit summary with patients personalized plan was offered to patient via MyChart   Nurse Notes:

## 2024-04-10 NOTE — Patient Instructions (Signed)
 Mr. Keith [name] , Thank you for taking time to come for your Medicare Wellness Visit. I appreciate your ongoing commitment to your health goals. Please review the following plan we discussed and let me know if I can assist you in the future.   Screening recommendations/referrals: Colonoscopy:  Recommended yearly ophthalmology/optometry visit for glaucoma screening and checkup Recommended yearly dental visit for hygiene and checkup  Vaccinations: Influenza vaccine:  Pneumococcal vaccine:  Tdap vaccine:  Shingles vaccine:        Preventive Care 65 Years and Older, Male Preventive care refers to lifestyle choices and visits with your health care provider that can promote health and wellness. What does preventive care include? A yearly physical exam. This is also called an annual well check. Dental exams once or twice a year. Routine eye exams. Ask your health care provider how often you should have your eyes checked. Personal lifestyle choices, including: Daily care of your teeth and gums. Regular physical activity. Eating a healthy diet. Avoiding tobacco and drug use. Limiting alcohol use. Practicing safe sex. Taking low doses of aspirin every day. Taking vitamin and mineral supplements as recommended by your health care provider. What happens during an annual well check? The services and screenings done by your health care provider during your annual well check will depend on your age, overall health, lifestyle risk factors, and family history of disease. Counseling  Your health care provider may ask you questions about your: Alcohol use. Tobacco use. Drug use. Emotional well-being. Home and relationship well-being. Sexual activity. Eating habits. History of falls. Memory and ability to understand (cognition). Work and work Astronomer. Screening  You may have the following tests or measurements: Height, weight, and BMI. Blood pressure. Lipid and cholesterol levels. These  may be checked every 5 years, or more frequently if you are over 3 years old. Skin check. Lung cancer screening. You may have this screening every year starting at age 12 if you have a 30-pack-year history of smoking and currently smoke or have quit within the past 15 years. Fecal occult blood test (FOBT) of the stool. You may have this test every year starting at age 92. Flexible sigmoidoscopy or colonoscopy. You may have a sigmoidoscopy every 5 years or a colonoscopy every 10 years starting at age 39. Prostate cancer screening. Recommendations will vary depending on your family history and other risks. Hepatitis C blood test. Hepatitis B blood test. Sexually transmitted disease (STD) testing. Diabetes screening. This is done by checking your blood sugar (glucose) after you have not eaten for a while (fasting). You may have this done every 1-3 years. Abdominal aortic aneurysm (AAA) screening. You may need this if you are a current or former smoker. Osteoporosis. You may be screened starting at age 4 if you are at high risk. Talk with your health care provider about your test results, treatment options, and if necessary, the need for more tests. Vaccines  Your health care provider may recommend certain vaccines, such as: Influenza vaccine. This is recommended every year. Tetanus, diphtheria, and acellular pertussis (Tdap, Td) vaccine. You may need a Td booster every 10 years. Zoster vaccine. You may need this after age 41. Pneumococcal 13-valent conjugate (PCV13) vaccine. One dose is recommended after age 14. Pneumococcal polysaccharide (PPSV23) vaccine. One dose is recommended after age 101. Talk to your health care provider about which screenings and vaccines you need and how often you need them. This information is not intended to replace advice given to you by your health  care provider. Make sure you discuss any questions you have with your health care provider. Document Released:  07/25/2015 Document Revised: 03/17/2016 Document Reviewed: 04/29/2015 Elsevier Interactive Patient Education  2017 [employer].  Fall Prevention in the Home Falls can cause injuries. They can happen to people of all ages. There are many things you can do to make your home safe and to help prevent falls. What can I do on the outside of my home? Regularly fix the edges of walkways and driveways and fix any cracks. Remove anything that might make you trip as you walk through a door, such as a raised step or threshold. Trim any bushes or trees on the path to your home. Use bright outdoor lighting. Clear any walking paths of anything that might make someone trip, such as rocks or tools. Regularly check to see if handrails are loose or broken. Make sure that both sides of any steps have handrails. Any raised decks and porches should have guardrails on the edges. Have any leaves, snow, or ice cleared regularly. Use sand or salt on walking paths during winter. Clean up any spills in your garage right away. This includes oil or grease spills. What can I do in the bathroom? Use night lights. Install grab bars by the toilet and in the tub and shower. Do not use towel bars as grab bars. Use non-skid mats or decals in the tub or shower. If you need to sit down in the shower, use a plastic, non-slip stool. Keep the floor dry. Clean up any water  that spills on the floor as soon as it happens. Remove soap buildup in the tub or shower regularly. Attach bath mats securely with double-sided non-slip rug tape. Do not have throw rugs and other things on the floor that can make you trip. What can I do in the bedroom? Use night lights. Make sure that you have a light by your bed that is easy to reach. Do not use any sheets or blankets that are too big for your bed. They should not hang down onto the floor. Have a firm chair that has side arms. You can use this for support while you get dressed. Do not have  throw rugs and other things on the floor that can make you trip. What can I do in the kitchen? Clean up any spills right away. Avoid walking on wet floors. Keep items that you use a lot in easy-to-reach places. If you need to reach something above you, use a strong step stool that has a grab bar. Keep electrical cords out of the way. Do not use floor polish or wax that makes floors slippery. If you must use wax, use non-skid floor wax. Do not have throw rugs and other things on the floor that can make you trip. What can I do with my stairs? Do not leave any items on the stairs. Make sure that there are handrails on both sides of the stairs and use them. Fix handrails that are broken or loose. Make sure that handrails are as long as the stairways. Check any carpeting to make sure that it is firmly attached to the stairs. Fix any carpet that is loose or worn. Avoid having throw rugs at the top or bottom of the stairs. If you do have throw rugs, attach them to the floor with carpet tape. Make sure that you have a light switch at the top of the stairs and the bottom of the stairs. If you do not  have them, ask someone to add them for you. What else can I do to help prevent falls? Wear shoes that: Do not have high heels. Have rubber bottoms. Are comfortable and fit you well. Are closed at the toe. Do not wear sandals. If you use a stepladder: Make sure that it is fully opened. Do not climb a closed stepladder. Make sure that both sides of the stepladder are locked into place. Ask someone to hold it for you, if possible. Clearly mark and make sure that you can see: Any grab bars or handrails. First and last steps. Where the edge of each step is. Use tools that help you move around (mobility aids) if they are needed. These include: Canes. Walkers. Scooters. Crutches. Turn on the lights when you go into a dark area. Replace any light bulbs as soon as they burn out. Set up your furniture so  you have a clear path. Avoid moving your furniture around. If any of your floors are uneven, fix them. If there are any pets around you, be aware of where they are. Review your medicines with your doctor. Some medicines can make you feel dizzy. This can increase your chance of falling. Ask your doctor what other things that you can do to help prevent falls. This information is not intended to replace advice given to you by your health care provider. Make sure you discuss any questions you have with your health care provider. Document Released: 04/24/2009 Document Revised: 12/04/2015 Document Reviewed: 08/02/2014 Elsevier Interactive Patient Education  2017 [employer].

## 2024-04-10 NOTE — Progress Notes (Signed)
 Hi Keith [name],  Your blood counts are stable. Please make sure you continue good sources of iron in your diet. Your kidney function has decreased slightly from last check in January. Are you drinking clear fluids frequently during the day and enough water ?   Your LDL is slightly elevated at 110. Your total cholesterol and triglycerides have improved with diet changes. We will continue to monitor.  Thyroid  function is stable.   I would recommend increasing clear fluids and we can recheck your renal function in 1 month.

## 2024-04-11 ENCOUNTER — Ambulatory Visit (HOSPITAL_BASED_OUTPATIENT_CLINIC_OR_DEPARTMENT_OTHER)
Admission: RE | Admit: 2024-04-11 | Discharge: 2024-04-11 | Disposition: A | Discharge status: Home / Self Care | Source: Ambulatory Visit | Attending: Family Medicine | Admitting: Family Medicine

## 2024-04-11 DIAGNOSIS — K4090 Unilateral inguinal hernia, without obstruction or gangrene, not specified as recurrent: Secondary | ICD-10-CM | POA: Diagnosis not present

## 2024-04-12 ENCOUNTER — Ambulatory Visit (INDEPENDENT_AMBULATORY_CARE_PROVIDER_SITE_OTHER): Admitting: Family Medicine

## 2024-04-12 ENCOUNTER — Encounter (HOSPITAL_BASED_OUTPATIENT_CLINIC_OR_DEPARTMENT_OTHER): Payer: Self-pay | Admitting: Family Medicine

## 2024-04-12 VITALS — BP 166/80 | HR 44 | Ht 71.0 in | Wt 189.0 lb

## 2024-04-12 DIAGNOSIS — I10 Essential (primary) hypertension: Secondary | ICD-10-CM | POA: Diagnosis not present

## 2024-04-12 DIAGNOSIS — K4090 Unilateral inguinal hernia, without obstruction or gangrene, not specified as recurrent: Secondary | ICD-10-CM | POA: Diagnosis not present

## 2024-04-12 NOTE — Patient Instructions (Addendum)
 Check Blood pressure - twice daily.   Return in 1 week for BP check and BMP   Call General Surgery TODAY.  If pain, swelling, discoloration occurs,- go to [facility] ER or [facility]

## 2024-04-12 NOTE — Progress Notes (Signed)
 Discussed result with patient over the phone. He states he is having more abdominal discomfort and has not heard from Gen Surg. PCP called gen surg and they are not able to get in for more urgent evaluation until next week. He will come to the office today at 3;50 for re-evaluation by PCP and possibly will go to ER if necessary.

## 2024-04-13 NOTE — Progress Notes (Signed)
 Subjective:   Keith [name]. Feb 09, 1941 04/12/2024  Chief Complaint  Patient presents with   Medical Management of Chronic Issues    Pt is here following up after recent US . Still having a lot of inguinal pain. States that when he is not doing anything, the pain is worse and also states that it is affecting his back and also urge to go to the bathroom.    HPI: Keith [name]. presents today for re-assessment of right inguinal hernia. Patient was seen on April 06, 2024 for right inguinal pain.  He had an ultrasound performed on 04/11/2024 which showed a small inguinal hernia containing fat and a normally peristalsing bowel loop.  Since 04/11/2024, patient states pain had increased.  He had not been contacted by general surgery referral that had been placed on 04/06/2024 and had reported increased pain.  He states he woke up and could feel a palpable area of pain to the right inguinal area and was able to reduce this with palpation.  At this time, he rates pain 1 out of 10.  He denies any difficulty with bowel movements, urination, or movement.  He denies fevers, chills, nausea, vomiting, diarrhea or constipation.  Denies any discoloration or redness.  PCP attempted to get patient into [facility] surgery but was unsuccessful as they had no openings today or Friday.   The following portions of the patient's history were reviewed and updated as appropriate: past medical history, past surgical history, family history, social history, allergies, medications, and problem list.   Patient Active Problem List   Diagnosis Date Noted   Inguinal hernia of left side without obstruction or gangrene 04/06/2024   Aneurysm of thoracic aorta 08/04/2023   Aortic valve disorder 08/04/2023   Gastroesophageal reflux disease without esophagitis 08/04/2023   Actinic keratosis due to exposure to sunlight 08/04/2023   Permanent atrial fibrillation (HCC) 05/03/2023   Paroxysmal atrial flutter  (HCC) 05/03/2023   History of stroke 05/03/2023   Chronic anticoagulation 05/03/2023   Idiopathic peripheral neuropathy 10/22/2022   Impaired fasting glucose 10/22/2022   Hypocapnia 04/07/2022   Arthritis of left knee    S/P TKR (total knee replacement), left 12/31/2021   Arthritis of left shoulder region    S/P reverse total shoulder arthroplasty, left 09/01/2021   Bilateral carpal tunnel syndrome 07/20/2021   Chronic kidney disease, stage 3a (HCC) 07/19/2021   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 05/13/2021   Basal cell carcinoma of skin 05/13/2021   Chronic low back pain 05/13/2021   History of urinary stone 05/13/2021   Status post left hip replacement 12/19/2020   Primary osteoarthritis of left hip 12/04/2020   Hyperlipidemia 04/30/2020   Cataract 10/10/2019   Right ureteral stone 10/01/2019   Left ureteral stone 10/01/2019   S/P lumbar fusion 06/15/2019   Preop cardiovascular exam 09/12/2017   Acquired hypothyroidism 08/10/2017   Benign prostatic hyperplasia with nocturia 08/10/2017   S/P lumbar laminectomy 06/01/2017   PAF (paroxysmal atrial fibrillation) (HCC) 09/07/2016   S/P AVR 01/18/2014   Hypertension 02/22/2013   Past Medical History:  Diagnosis Date   Arthritis    Atrial fibrillation [facility])    Cardioversion 2016   Atrial flutter (HCC)    Cardioversion 2013   Basal cell carcinoma 08/31/2023   Right temple - needs Mohs   Blood transfusion without reported diagnosis    Post open heart surgery   Cancer (HCC)    Skin- Basil/Squamous cell   Cataract  2022   Clotting disorder    On blood thinners   Dysrhythmia    A.fib, cardioversions x2   GERD (gastroesophageal reflux disease)    History of aortic valve replacement with bioprosthetic valve    Aortic regurgitation   History of chicken pox    History of kidney stones    History of skin cancer    History of stroke 2011   Hypertension    Hypothyroidism    Kidney stone     Neuromuscular disorder (HCC) 04/2023   Neuropathy of feet   Seasonal allergies    Stroke [facility])    after heart surgery 2010   Past Surgical History:  Procedure Laterality Date   AORTIC VALVE REPLACEMENT  2009   Bioprosthetic AVR and root replacement   BACK SURGERY     total of 4 back surgeries   BILATERAL CARPAL TUNNEL RELEASE Bilateral    2022,2023   CARDIAC VALVE REPLACEMENT  2010   Aortic valve   CATARACT EXTRACTION, BILATERAL     COLONOSCOPY     CYSTOSCOPY WITH RETROGRADE PYELOGRAM, URETEROSCOPY AND STENT PLACEMENT Bilateral 10/01/2019   Procedure: CYSTOSCOPY WITH RETROGRADE PYELOGRAM, URETEROSCOPY AND STENT PLACEMENT;  Surgeon: Watt Rush, MD;  Location: [facility] ORS;  Service: Urology;  Laterality: Bilateral;   CYSTOSCOPY/URETEROSCOPY/HOLMIUM LASER/STENT PLACEMENT Left 05/10/2023   Procedure: CYSTOSCOPY LEFT RETROGRADE LEFT URETEROSCOPY/HOLMIUM LASER/STENT PLACEMENT;  Surgeon: Watt Rush, MD;  Location: [facility];  Service: Urology;  Laterality: Left;  1 HR FOR CASE   EPIGASTRIC HERNIA REPAIR     HERNIA REPAIR     JOINT REPLACEMENT  2023   Knee, Hip, shoulder   LUMBAR LAMINECTOMY/DECOMPRESSION MICRODISCECTOMY Right 06/01/2017   Procedure: EXTRAFORAMINAL MICRODISCECTOMY LUMBAR FIVE- SACRAL ONE, RIGHT;  Surgeon: Joshua Alm RAMAN, MD;  Location: [facility] OR;  Service: Neurosurgery;  Laterality: Right;   LUMBAR LAMINECTOMY/DECOMPRESSION MICRODISCECTOMY Bilateral 09/04/2018   Procedure: Laminectomy and Foraminotomy - Lumbar two-three bilateral;  Surgeon: Joshua Alm RAMAN, MD;  Location: [facility] OR;  Service: Neurosurgery;  Laterality: Bilateral;   REVERSE SHOULDER ARTHROPLASTY Left 09/01/2021   Procedure: LEFT REVERSE SHOULDER ARTHROPLASTY;  Surgeon: Addie Cordella Hamilton, MD;  Location: [facility] OR;  Service: Orthopedics;  Laterality: Left;   SPINE SURGERY  2020   Lumbar laminectomy   TOTAL HIP ARTHROPLASTY Left 12/19/2020   Procedure: LEFT TOTAL HIP ARTHROPLASTY ANTERIOR APPROACH;  Surgeon: Vernetta Lonni GRADE, MD;  Location: [facility];  Service: Orthopedics;  Laterality: Left;   TOTAL KNEE ARTHROPLASTY Left 12/31/2021   Procedure: TOTAL KNEE ARTHROPLASTY;  Surgeon: Addie Cordella Hamilton, MD;  Location: [facility] OR;  Service: Orthopedics;  Laterality: Left;   TRANSFORAMINAL LUMBAR INTERBODY FUSION (TLIF) WITH PEDICLE SCREW FIXATION 1 LEVEL Left 06/15/2019   Procedure: Decompression and Instrumention Fusion Lumbar Four- Five;  Surgeon: Joshua Alm RAMAN, MD;  Location: [facility] OR;  Service: Neurosurgery;  Laterality: Left;  Decompression and Instrumention Fusion Lumbar Four- Five   Family History  Problem Relation Age of Onset   Colon cancer Neg Hx    Esophageal cancer Neg Hx    Rectal cancer Neg Hx    Stomach cancer Neg Hx    Outpatient Medications Prior to Visit  Medication Sig Dispense Refill   acetaminophen  (TYLENOL ) 500 MG tablet Take 1,000 mg by mouth every 6 (six) hours as needed for moderate pain.     apixaban  (ELIQUIS ) 5 MG TABS tablet Take 1 tablet (5 mg total) by mouth 2 (two) times daily. 180 tablet 3   clobetasol  (TEMOVATE ) 0.05 % external solution  Apply 1 Application topically 2 (two) times daily. Apply to scalp 50 mL 1   finasteride  (PROSCAR ) 5 MG tablet Take 1 tablet (5 mg total) by mouth daily. 90 tablet 3   fluorouracil  (EFUDEX ) 5 % cream Apply 1 Application topically daily as needed. 40 g PRN   levothyroxine  (EUTHYROX ) 75 MCG tablet Take 1 tablet (75 mcg total) by mouth daily. 90 tablet 3   lisinopril -hydrochlorothiazide  (ZESTORETIC ) 10-12.5 MG tablet Take 1 tablet by mouth daily. 90 tablet 3   pantoprazole  (PROTONIX ) 40 MG tablet Take 1 tablet (40 mg total) by mouth daily. 90 tablet 3   pregabalin  (LYRICA ) 50 MG capsule Take 1 capsule (50 mg total) by mouth at bedtime as needed. 30 capsule 2   tamsulosin  (FLOMAX ) 0.4 MG CAPS capsule Take 1 capsule (0.4 mg total) by mouth daily. 100 capsule 3   finasteride  (PROSCAR ) 5 MG tablet Take 1 tablet (5 mg total) by mouth daily. 90 tablet 3    No facility-administered medications prior to visit.   Allergies  Allergen Reactions   Tape Rash    Tears skin      ROS: A complete ROS was performed with pertinent positives/negatives noted in the HPI. The remainder of the ROS are negative.    Objective:   Today's Vitals   04/12/24 1536 04/12/24 1615  BP: (!) 176/59 (!) 166/80  Pulse: (!) 44   SpO2: 99%   Weight: 189 lb (85.7 kg)   Height: 5' 11 (1.803 m)     Physical Exam   GENERAL: Well-appearing, in NAD. Well nourished.  SKIN: Pink, warm and dry.  Head: Normocephalic. NECK: Trachea midline. Full ROM w/o pain or tenderness.  RESPIRATORY: Chest wall symmetrical. Respirations even and non-labored. MSK: Muscle tone and strength appropriate for age. Joints w/o tenderness, redness, or swelling.  GI: Abdomen soft, non-tender. Normoactive bowel sounds. No rebound tenderness. No hepatomegaly or splenomegaly. No CVA tenderness.  Mild palpable inguinal hernia present to right inguinal area.  No discoloration, redness, or tenderness upon palpation. EXTREMITIES: Without clubbing, cyanosis, or edema.  NEUROLOGIC: No motor or sensory deficits. Steady, even gait. C2-C12 intact.  PSYCH/MENTAL STATUS: Alert, oriented x 3. Cooperative, appropriate mood and affect.     Assessment & Plan:   1. Inguinal hernia of right side without obstruction or gangrene (Primary) No signs of obstruction or incarceration upon exam today.  Patient made aware of signs and symptoms to monitor for and will call [facility] today to reschedule appointment from next Tuesday as he will be out of town.  Patient states if pain worsens, swelling occurs, or anything changes he will go to the ER immediately.  2. Primary Hypertension BP elevated today, improved upon manual recheck.  Patient states blood pressure has been well-controlled and declines change to medication.  He will continue to monitor with home BP cuff.  Return in 1 week for BP check and  if elevated, will increase lisinopril  HCTZ.   No orders of the defined types were placed in this encounter.  Lab Orders  No laboratory test(s) ordered today   No images are attached to the encounter or orders placed in the encounter.  Return in about 1 week (around 04/19/2024) for BP Check only and BMP- 3:50 .    Patient to reach out to office if new, worrisome, or unresolved symptoms arise or if no improvement in patient's condition. Patient verbalized understanding and is agreeable to treatment plan. All questions answered to patient's satisfaction.    [employer]  Schuyler Stark, FNP

## 2024-04-19 ENCOUNTER — Ambulatory Visit (INDEPENDENT_AMBULATORY_CARE_PROVIDER_SITE_OTHER): Admitting: *Deleted

## 2024-04-19 VITALS — BP 134/45

## 2024-04-19 DIAGNOSIS — I10 Essential (primary) hypertension: Secondary | ICD-10-CM

## 2024-04-19 NOTE — Progress Notes (Signed)
 Pt came today for repeat BP and  labs. BP checked twice and both readings have been documented. Asked pt if he had heard from [place] and he said that he finally did and is scheduled to see them next week.

## 2024-04-20 LAB — BASIC METABOLIC PANEL WITH GFR
BUN/Creatinine Ratio: 26 — ABNORMAL HIGH (ref 10–24)
BUN: 34 mg/dL — ABNORMAL HIGH (ref 8–27)
CO2: 21 mmol/L (ref 20–29)
Calcium: 8.6 mg/dL (ref 8.6–10.2)
Chloride: 107 mmol/L — ABNORMAL HIGH (ref 96–106)
Creatinine, Ser: 1.3 mg/dL — ABNORMAL HIGH (ref 0.76–1.27)
Glucose: 97 mg/dL (ref 70–99)
Potassium: 4.3 mmol/L (ref 3.5–5.2)
Sodium: 142 mmol/L (ref 134–144)
eGFR: 54 mL/min/1.73 — ABNORMAL LOW (ref >59)

## 2024-04-24 ENCOUNTER — Ambulatory Visit (HOSPITAL_BASED_OUTPATIENT_CLINIC_OR_DEPARTMENT_OTHER): Payer: Self-pay | Admitting: Family Medicine

## 2024-04-24 NOTE — Progress Notes (Signed)
 Hi Don,  Your kidney function improved from prior. I would recommend rechecking this in 2-3 weeks and increasing your water  intake as creatinine is still elevated. If a lab visit is fine with you, please let me know and I will get this scheduled.

## 2024-04-25 ENCOUNTER — Ambulatory Visit: Payer: Self-pay | Admitting: General Surgery

## 2024-04-25 DIAGNOSIS — K4090 Unilateral inguinal hernia, without obstruction or gangrene, not specified as recurrent: Secondary | ICD-10-CM | POA: Diagnosis not present

## 2024-04-28 ENCOUNTER — Other Ambulatory Visit (HOSPITAL_BASED_OUTPATIENT_CLINIC_OR_DEPARTMENT_OTHER): Payer: Self-pay

## 2024-05-04 ENCOUNTER — Other Ambulatory Visit: Payer: Self-pay

## 2024-05-04 ENCOUNTER — Encounter: Payer: Self-pay | Admitting: Internal Medicine

## 2024-05-04 ENCOUNTER — Telehealth (HOSPITAL_BASED_OUTPATIENT_CLINIC_OR_DEPARTMENT_OTHER): Payer: Self-pay

## 2024-05-04 NOTE — Telephone Encounter (Signed)
   Pre-operative Risk Assessment    Patient Name: Keith [name].  DOB: 1941-01-17 MRN: [id number]   Date of last office visit: 05/03/23 With Mallipeddi Date of next office visit: NA   Request for Surgical Clearance    Procedure:  hernia surgery  Date of Surgery:  Clearance TBD                                Surgeon:  Dr. Rubin Socks Group or Practice Name:  [facility] Phone number:  [phone]  Fax number:  [phone]   Type of Clearance Requested:   - Medical  - Pharmacy:  Hold Apixaban  (Eliquis ) for 2 days prior to the procedure   Type of Anesthesia:  general   Additional requests/questions:    Bonney Augustin JONETTA Delores   05/04/2024, 4:59 PM

## 2024-05-05 ENCOUNTER — Other Ambulatory Visit (HOSPITAL_BASED_OUTPATIENT_CLINIC_OR_DEPARTMENT_OTHER): Payer: Self-pay

## 2024-05-07 ENCOUNTER — Other Ambulatory Visit: Payer: Self-pay

## 2024-05-07 ENCOUNTER — Ambulatory Visit: Attending: Nurse Practitioner | Admitting: Nurse Practitioner

## 2024-05-07 ENCOUNTER — Encounter: Payer: Self-pay | Admitting: Nurse Practitioner

## 2024-05-07 ENCOUNTER — Other Ambulatory Visit (HOSPITAL_BASED_OUTPATIENT_CLINIC_OR_DEPARTMENT_OTHER): Payer: Self-pay

## 2024-05-07 ENCOUNTER — Other Ambulatory Visit (HOSPITAL_COMMUNITY): Payer: Self-pay

## 2024-05-07 ENCOUNTER — Encounter: Payer: Self-pay | Admitting: *Deleted

## 2024-05-07 VITALS — BP 141/62 | HR 48 | Ht 71.0 in | Wt 192.2 lb

## 2024-05-07 DIAGNOSIS — R7989 Other specified abnormal findings of blood chemistry: Secondary | ICD-10-CM | POA: Diagnosis not present

## 2024-05-07 DIAGNOSIS — Z01818 Encounter for other preprocedural examination: Secondary | ICD-10-CM | POA: Diagnosis not present

## 2024-05-07 DIAGNOSIS — I4821 Permanent atrial fibrillation: Secondary | ICD-10-CM

## 2024-05-07 DIAGNOSIS — R001 Bradycardia, unspecified: Secondary | ICD-10-CM

## 2024-05-07 DIAGNOSIS — I4892 Unspecified atrial flutter: Secondary | ICD-10-CM | POA: Diagnosis not present

## 2024-05-07 DIAGNOSIS — E785 Hyperlipidemia, unspecified: Secondary | ICD-10-CM

## 2024-05-07 DIAGNOSIS — Z952 Presence of prosthetic heart valve: Secondary | ICD-10-CM

## 2024-05-07 DIAGNOSIS — I10 Essential (primary) hypertension: Secondary | ICD-10-CM

## 2024-05-07 MED ORDER — AMOXICILLIN 500 MG PO CAPS
2000.0000 mg | ORAL_CAPSULE | Freq: Once | ORAL | 1 refills | Status: AC | PRN
  Filled 2024-05-07: qty 4, 1d supply, fill #0

## 2024-05-07 NOTE — Patient Instructions (Addendum)
 Medication Instructions:   Amoxicillin 2gm x 1 dose 30-60 minutes prior to dental procedure due history of aortic valve replacement Continue all other medications.     Labwork:  none  Testing/Procedures:  none  Follow-Up:  6 months   Any Other Special Instructions Will Be Listed Below (If Applicable).  BP log [place]  Nurse visit in 2-3 wks - bring BP log   If you need a refill on your cardiac medications before your next appointment, please call your pharmacy.

## 2024-05-07 NOTE — Telephone Encounter (Signed)
 I have reviewed the [facility] schedule to see where we can get the pt in for preop clearance. I did not see any appts until 06/19/24. I will see if [facility] team may be able to reach out to the pt with a sooner appt.

## 2024-05-07 NOTE — Progress Notes (Addendum)
 Cardiology Office Note   Date:  05/07/2024  ID:  Keith JONETTA Joshua Mickey., DOB 1941/02/09, MRN [id number] PCP: Knute Thersia Bitters, FNP  [facility] Providers Cardiologist:  Diannah SHAUNNA Maywood, MD     History of Present Illness Keith Kingsley. is a 83 y.o. 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   Last seen by Dr. Maywood on May 03, 2023. Keith [name] was pending surgery at the time. Overall was doing well. Echo was updated. Was noted to have A-fib with slow ventricular response from previous EKG, was asymptomatic with this.  Echocardiogram in November 2024 showed normal LVEF, mild LVH, mildly elevated PASP, estimated right ventricular systolic pressure at 42.8 mmHg, normal function of aortic prosthesis with trivial AR. See full report below.   Today Keith [name] is here for pre-op clearance. Keith [name] is pending hernia surgery with Dr. Rubin of [facility] and surgery date is TBD. Keith [name] is doing very well. Does tell me that the surgery is being requested to be done as soon as possible.  Denies any acute cardiac complaints or issues.  SBP at home averages 135-145.  Does state when Keith [name] goes to doctors appointments that blood pressure tends to run high. Denies any chest pain, shortness of breath, palpitations, syncope, presyncope, dizziness, orthopnea, PND, swelling or significant weight changes, acute bleeding, or claudication.   ROS: Negative.  See HPI. 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  Enjoys rebuilding old cars in his free time. Owns 32 acres of land and owns 2 tractors.  Studies Reviewed  EKG: EKG Interpretation Date/Time:  Monday May 07 2024 14:23:53 EDT Ventricular Rate:  52 PR Interval:    QRS  Duration:  90 QT Interval:  432 QTC Calculation: 401 R Axis:   13  Text Interpretation: Atrial fibrillation with slow ventricular response When compared with ECG of 27-Apr-2023 10:25, Borderline criteria for Anterior infarct are no longer Present Confirmed by Miriam Norris [phone]) on 05/07/2024 2:42:56 PM    Echocardiogram 05/2023: 1. Left ventricular ejection fraction, by estimation, is 60 to 65%. The  left ventricle has normal function. The left ventricle has no regional  wall motion abnormalities. There is mild left ventricular hypertrophy.  Left ventricular diastolic parameters  are indeterminate.   2. Right ventricular systolic function is normal. The right ventricular  size is normal. There is mildly elevated pulmonary artery systolic  pressure. The estimated right ventricular systolic pressure is 42.8 mmHg.   3. Left atrial size was moderately dilated.   4. The mitral valve is abnormal. No evidence of mitral valve  regurgitation. No evidence of mitral stenosis. Moderate mitral annular  calcification.   5. The aortic valve has been repaired/replaced. Aortic valve  regurgitation is trivial. No aortic stenosis is present. There is a 27 mm  Magna valve present in the aortic position. Procedure Date: 2009. Aortic  valve mean gradient measures 8.0 mmHg.   6. Aortic dilatation noted. There is mild dilatation of the aortic root,  measuring 43 mm. Normal size of ascending aorta.   7. The inferior vena cava is dilated in size with >50% respiratory  variability, suggesting right atrial pressure of 8 mmHg.   Comparison(s): No significant change from prior study.  Risk Assessment/Calculations  HYPERTENSION CONTROL  Vitals:   05/07/24 1414 05/07/24 1445  BP: (!) 158/70 (!) 141/62    The patient's blood pressure is elevated above target today.  In order to address the patient's elevated BP: Blood pressure will be monitored at home to determine if medication changes need to be made.;  The blood pressure is usually elevated in clinic.  Blood pressures monitored at home have been optimal.; Follow up with general cardiology has been recommended.         Physical Exam VS:  BP (!) 141/62   Pulse (!) 48   Ht 5' 11 (1.803 m)   Wt 192 lb 3.2 oz (87.2 kg)   SpO2 97%   BMI 26.81 kg/m        Wt Readings from Last 3 Encounters:  05/07/24 192 lb 3.2 oz (87.2 kg)  04/12/24 189 lb (85.7 kg)  04/10/24 189 lb (85.7 kg)    GEN: Well nourished, well developed in no acute distress NECK: No JVD; No carotid bruits CARDIAC: S1/S2, slow rate and irregular rhythm, Grade 2/6 murmur,  no rubs, no gallops RESPIRATORY:  Clear to auscultation without rales, wheezing or rhonchi  ABDOMEN: Soft, non-tender, non-distended EXTREMITIES:  No edema; No deformity   ASSESSMENT AND PLAN  Pre-operative cardiovascular risk assessment Keith [name] perioperative risk of a major cardiac event is 0.9% according to the Revised Cardiac Risk Index (RCRI).  Therefore, Keith [name] is at low risk for perioperative complications.   His functional capacity is excellent at 6.61 METs according to the Duke Activity Status Index (DASI). Recommendations: According to ACC/AHA guidelines, no further cardiovascular testing needed.  The patient may proceed to surgery at acceptable risk.   Antiplatelet and/or Anticoagulation Recommendations: Will consult clinical Pharm.D. regarding how long to hold Eliquis  prior to procedure.  Then we will addendum note and send to requesting party.  2.  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  Keith [name] is pending an echo coming up for 1 year follow-up.  SBE prophylaxis discussed.  Will prescribe amoxicillin 2 g one-time dose as needed 30 to 60 minutes prior to dental procedure.  Keith [name] verbalized understanding.  3.  Permanent  A-fib/paroxysmal A- flutter, bradycardia Denies any tachycardia or palpitations.  Heart rate is 48 bpm, asymptomatic with this.  No indication for pacemaker at this time.  Currently not on any AV nodal agents and want to avoid with his current heart rate.  Will continue to follow. Heart healthy diet and regular cardiovascular exercise encouraged.  Continue Eliquis  for stroke prevention.  Keith [name] is on appropriate dosage and denies any bleeding issues.  4. HTN Blood pressure on arrival today was 158/70, repeat BP 141/62. SBP goal < 140. Discussed to monitor BP at home at least 2 hours after medications and sitting for 5-10 minutes. Continue Zestoretic .  Keith [name] will update us  in 2-3 weeks with BP readings via MyChart. If BP is not at goal, plan to increase lisinopril  dosage. Heart healthy diet and regular cardiovascular exercise encouraged.   5.  Aortic root dilatation Most recent echo from 1 year ago showed mild dilatation of aortic root, measuring 43 mm.  Normal-sized ascending aorta.  Keith [name] is pending repeat echo for next month.  6. HLD Most recent lipid panel on file shows LDL of 110. Current goal of < 100. PCP is managing this. Heart healthy diet and regular cardiovascular exercise encouraged.  7. Elevated serum creatinine Most recent sCr improved to 1.30 with eGFR of 54, improved from previous. PCP is managing this and agree with PCP's recommendations.    Dispo: Follow-up with MD/APP in 6 months or sooner anything changes.  Signed, Almarie Crate, NP

## 2024-05-07 NOTE — Telephone Encounter (Signed)
 Pt scheduled to see Almarie Crate, NP today for preop clearance.

## 2024-05-07 NOTE — Telephone Encounter (Signed)
   Name: Keith [name].  DOB: 05-14-1941  MRN: [id number]  Primary Cardiologist: Vishnu P Mallipeddi, MD  Chart reviewed as part of pre-operative protocol coverage. Because of Keith AGE Jr.'s past medical history and time since last visit, he will require a follow-up in-office visit in order to better assess preoperative cardiovascular risk.  Pre-op covering staff: - Please schedule appointment and call patient to inform them. If patient already had an upcoming appointment within acceptable timeframe, please add pre-op clearance to the appointment notes so provider is aware. - Please contact requesting surgeon's office via preferred method (i.e, phone, fax) to inform them of need for appointment prior to surgery.  This message will also be routed to pharmacy pool for input on holding Eliquis  as requested below so that this information is available to the clearing provider at time of patient's appointment.   Keith LITTIE Louis, NP  05/07/2024, 8:19 AM

## 2024-05-08 ENCOUNTER — Other Ambulatory Visit (HOSPITAL_BASED_OUTPATIENT_CLINIC_OR_DEPARTMENT_OTHER): Payer: Self-pay

## 2024-05-09 ENCOUNTER — Other Ambulatory Visit (HOSPITAL_COMMUNITY): Payer: Self-pay

## 2024-05-09 ENCOUNTER — Other Ambulatory Visit: Payer: Self-pay

## 2024-05-09 NOTE — Telephone Encounter (Signed)
 Patient with diagnosis of afib on Eliquis  for anticoagulation.    Procedure: hernia surgery  Date of procedure: TBD   CHA2DS2-VASc Score = 5   This indicates a 7.2% annual risk of stroke. The patient's score is based upon: CHF History: 0 HTN History: 1 Diabetes History: 0 Stroke History: 2 Vascular Disease History: 0 Age Score: 2 Gender Score: 0      CrCl 52 ml/min Platelet count 154  Patient has not had an Afib/aflutter ablation in the last 3 months, DCCV within the last 4 weeks or a watchman implanted in the last 45 days   Per office protocol, patient can hold Eliquis  for 2 days prior to procedure.    **This guidance is not considered finalized until pre-operative APP has relayed final recommendations.**

## 2024-05-10 ENCOUNTER — Other Ambulatory Visit (HOSPITAL_BASED_OUTPATIENT_CLINIC_OR_DEPARTMENT_OTHER): Payer: Self-pay

## 2024-05-10 ENCOUNTER — Other Ambulatory Visit: Payer: Self-pay

## 2024-05-11 ENCOUNTER — Telehealth: Payer: Self-pay | Admitting: *Deleted

## 2024-05-11 NOTE — Telephone Encounter (Signed)
-----   Message from Almarie Crate sent at 05/10/2024  4:44 PM EDT ----- Please let patient know that I have heard back from clinical pharmacist who stated he can hold his Eliquis  2 days prior to his upcoming procedure.  He is cleared from a cardiac standpoint for surgery. Please fax this note over to requesting party.   Thanks!   Best, Almarie Crate, NP

## 2024-05-11 NOTE — Telephone Encounter (Signed)
 Left message for patient to call office. Will send fpl group with message from Keith [name].

## 2024-05-11 NOTE — Telephone Encounter (Signed)
Provider message relayed to patient.

## 2024-05-11 NOTE — Telephone Encounter (Signed)
 Pt was returning nurse call and is requesting a callback. Please advise.

## 2024-05-21 ENCOUNTER — Ambulatory Visit: Attending: Cardiology

## 2024-05-21 ENCOUNTER — Other Ambulatory Visit: Payer: Self-pay | Admitting: Internal Medicine

## 2024-05-21 DIAGNOSIS — Z952 Presence of prosthetic heart valve: Secondary | ICD-10-CM | POA: Diagnosis not present

## 2024-05-21 DIAGNOSIS — I351 Nonrheumatic aortic (valve) insufficiency: Secondary | ICD-10-CM

## 2024-05-22 LAB — ECHOCARDIOGRAM COMPLETE
AV Mean grad: 9.7 mmHg
AV Peak grad: 23.4 mmHg
Ao pk vel: 2.42 m/s
Calc EF: 73.8 %
P 1/2 time: 718 ms
S' Lateral: 3.4 cm
Single Plane A2C EF: 77.8 %
Single Plane A4C EF: 68.4 %

## 2024-05-24 ENCOUNTER — Ambulatory Visit: Payer: Self-pay | Admitting: Internal Medicine

## 2024-05-28 ENCOUNTER — Other Ambulatory Visit (HOSPITAL_BASED_OUTPATIENT_CLINIC_OR_DEPARTMENT_OTHER): Payer: Self-pay

## 2024-05-28 ENCOUNTER — Other Ambulatory Visit: Payer: Self-pay

## 2024-05-28 NOTE — Telephone Encounter (Signed)
-----   Message from Vishnu P Mallipeddi sent at 05/24/2024  1:10 PM EST ----- Normal LV function, normal RV function, severe biatrial dilatation, mild AR, normal aortic prosthesis with mean AV gradient 9.7 mmHg, ascending aorta dilatation 43 mm and CVP 3 mmHg.  Unchanged  findings compared to prior echocardiogram. ----- Message ----- From: Interface, [employer] Sent: 05/22/2024   6:45 PM EST To: Vishnu P Mallipeddi, MD

## 2024-05-28 NOTE — Telephone Encounter (Signed)
 The patient has been notified of the result and verbalized understanding.  All questions (if any) were answered. Keith [name], CMA 05/28/2024 4:18 PM

## 2024-05-29 ENCOUNTER — Ambulatory Visit: Admitting: Dermatology

## 2024-05-29 ENCOUNTER — Other Ambulatory Visit (HOSPITAL_BASED_OUTPATIENT_CLINIC_OR_DEPARTMENT_OTHER): Payer: Self-pay

## 2024-05-30 ENCOUNTER — Other Ambulatory Visit (HOSPITAL_BASED_OUTPATIENT_CLINIC_OR_DEPARTMENT_OTHER): Payer: Self-pay

## 2024-05-30 DIAGNOSIS — K4090 Unilateral inguinal hernia, without obstruction or gangrene, not specified as recurrent: Secondary | ICD-10-CM | POA: Diagnosis not present

## 2024-05-30 DIAGNOSIS — D176 Benign lipomatous neoplasm of spermatic cord: Secondary | ICD-10-CM | POA: Diagnosis not present

## 2024-05-30 MED ORDER — TRAMADOL HCL 50 MG PO TABS
50.0000 mg | ORAL_TABLET | Freq: Four times a day (QID) | ORAL | 0 refills | Status: AC | PRN
  Filled 2024-05-30: qty 20, 5d supply, fill #0

## 2024-06-04 ENCOUNTER — Other Ambulatory Visit (HOSPITAL_BASED_OUTPATIENT_CLINIC_OR_DEPARTMENT_OTHER): Payer: Self-pay

## 2024-06-04 ENCOUNTER — Other Ambulatory Visit: Payer: Self-pay

## 2024-06-25 ENCOUNTER — Other Ambulatory Visit: Payer: Self-pay | Admitting: Dermatology

## 2024-06-25 DIAGNOSIS — L988 Other specified disorders of the skin and subcutaneous tissue: Secondary | ICD-10-CM

## 2024-06-26 ENCOUNTER — Other Ambulatory Visit (HOSPITAL_BASED_OUTPATIENT_CLINIC_OR_DEPARTMENT_OTHER): Payer: Self-pay

## 2024-06-26 MED ORDER — CLOBETASOL PROPIONATE 0.05 % EX SOLN
1.0000 | Freq: Two times a day (BID) | CUTANEOUS | 1 refills | Status: AC
  Filled 2024-07-09: qty 50, 60d supply, fill #0

## 2024-07-08 ENCOUNTER — Other Ambulatory Visit (HOSPITAL_BASED_OUTPATIENT_CLINIC_OR_DEPARTMENT_OTHER): Payer: Self-pay | Admitting: Family Medicine

## 2024-07-08 DIAGNOSIS — G609 Hereditary and idiopathic neuropathy, unspecified: Secondary | ICD-10-CM

## 2024-07-09 ENCOUNTER — Other Ambulatory Visit (HOSPITAL_BASED_OUTPATIENT_CLINIC_OR_DEPARTMENT_OTHER): Payer: Self-pay

## 2024-07-09 NOTE — Telephone Encounter (Signed)
 Routing to [facility] pool for either Dr. De [place] or Lauraine Norris to view to determine if a 90-day supply would be okay to send in for pt.

## 2024-07-11 ENCOUNTER — Other Ambulatory Visit (HOSPITAL_BASED_OUTPATIENT_CLINIC_OR_DEPARTMENT_OTHER): Payer: Self-pay

## 2024-07-11 MED ORDER — PREGABALIN 50 MG PO CAPS
50.0000 mg | ORAL_CAPSULE | Freq: Every evening | ORAL | 3 refills | Status: AC | PRN
  Filled 2024-07-11: qty 30, 30d supply, fill #0
  Filled 2024-08-15: qty 30, 30d supply, fill #1

## 2024-08-01 ENCOUNTER — Other Ambulatory Visit (HOSPITAL_BASED_OUTPATIENT_CLINIC_OR_DEPARTMENT_OTHER): Payer: Self-pay | Admitting: Family Medicine

## 2024-08-01 DIAGNOSIS — K219 Gastro-esophageal reflux disease without esophagitis: Secondary | ICD-10-CM

## 2024-08-02 ENCOUNTER — Other Ambulatory Visit: Payer: Self-pay

## 2024-08-02 MED ORDER — PANTOPRAZOLE SODIUM 40 MG PO TBEC
40.0000 mg | DELAYED_RELEASE_TABLET | Freq: Every day | ORAL | 3 refills | Status: AC
  Filled 2024-08-02: qty 90, 90d supply, fill #0

## 2024-08-03 ENCOUNTER — Other Ambulatory Visit: Payer: Self-pay

## 2024-08-03 ENCOUNTER — Other Ambulatory Visit (HOSPITAL_BASED_OUTPATIENT_CLINIC_OR_DEPARTMENT_OTHER): Payer: Self-pay

## 2024-08-03 ENCOUNTER — Other Ambulatory Visit (HOSPITAL_COMMUNITY): Payer: Self-pay

## 2024-08-09 ENCOUNTER — Other Ambulatory Visit: Payer: Self-pay

## 2024-08-16 ENCOUNTER — Other Ambulatory Visit: Payer: Self-pay

## 2024-10-04 ENCOUNTER — Ambulatory Visit (HOSPITAL_BASED_OUTPATIENT_CLINIC_OR_DEPARTMENT_OTHER): Admitting: Family Medicine

## 2024-11-05 ENCOUNTER — Ambulatory Visit: Admitting: Nurse Practitioner

## 2025-05-07 ENCOUNTER — Ambulatory Visit (HOSPITAL_BASED_OUTPATIENT_CLINIC_OR_DEPARTMENT_OTHER)
